# Patient Record
Sex: Male | Born: 1960
Health system: Southern US, Community
[De-identification: ages and names within clinical notes are randomized; demographics above are authoritative.]

## PROBLEM LIST (undated history)

## (undated) DIAGNOSIS — K859 Acute pancreatitis without necrosis or infection, unspecified: Secondary | ICD-10-CM

## (undated) DIAGNOSIS — E119 Type 2 diabetes mellitus without complications: Secondary | ICD-10-CM

## (undated) DIAGNOSIS — J449 Chronic obstructive pulmonary disease, unspecified: Secondary | ICD-10-CM

## (undated) HISTORY — PX: NO PAST SURGERIES: SHX2092

---

## 2010-09-12 ENCOUNTER — Inpatient Hospital Stay (HOSPITAL_COMMUNITY): Admission: EM | Admit: 2010-09-12 | Discharge: 2010-09-20 | Payer: Self-pay | Admitting: Emergency Medicine

## 2010-09-12 LAB — CONVERTED CEMR LAB
Hgb A1c MFr Bld: 6.2 %
LDL Cholesterol: 152 mg/dL
TSH: 1.471 microintl units/mL

## 2010-09-16 DIAGNOSIS — F1020 Alcohol dependence, uncomplicated: Secondary | ICD-10-CM

## 2010-09-18 LAB — CONVERTED CEMR LAB
BUN: 14 mg/dL
Chloride: 101 meq/L
Hemoglobin: 11.8 g/dL
MCHC: 34 g/dL
MCV: 100.3 fL
Potassium: 3.5 meq/L
RBC: 3.45 M/uL
Sodium: 140 meq/L
WBC: 14.3 10*3/uL

## 2010-10-19 ENCOUNTER — Ambulatory Visit: Payer: Self-pay | Admitting: Internal Medicine

## 2010-10-19 ENCOUNTER — Encounter (INDEPENDENT_AMBULATORY_CARE_PROVIDER_SITE_OTHER): Payer: Self-pay | Admitting: Nurse Practitioner

## 2010-10-19 DIAGNOSIS — K859 Acute pancreatitis without necrosis or infection, unspecified: Secondary | ICD-10-CM

## 2010-10-19 DIAGNOSIS — J441 Chronic obstructive pulmonary disease with (acute) exacerbation: Secondary | ICD-10-CM | POA: Insufficient documentation

## 2010-10-19 DIAGNOSIS — J449 Chronic obstructive pulmonary disease, unspecified: Secondary | ICD-10-CM | POA: Insufficient documentation

## 2010-10-19 DIAGNOSIS — F17200 Nicotine dependence, unspecified, uncomplicated: Secondary | ICD-10-CM

## 2010-10-19 HISTORY — DX: Acute pancreatitis without necrosis or infection, unspecified: K85.90

## 2010-10-19 LAB — CONVERTED CEMR LAB
Albumin: 4.2 g/dL (ref 3.5–5.2)
Alkaline Phosphatase: 87 units/L (ref 39–117)
BUN: 12 mg/dL (ref 6–23)
CO2: 24 meq/L (ref 19–32)
Glucose, Bld: 104 mg/dL — ABNORMAL HIGH (ref 70–99)
Sodium: 141 meq/L (ref 135–145)
Total Bilirubin: 0.7 mg/dL (ref 0.3–1.2)
Total Protein: 6.6 g/dL (ref 6.0–8.3)

## 2010-10-20 ENCOUNTER — Encounter (INDEPENDENT_AMBULATORY_CARE_PROVIDER_SITE_OTHER): Payer: Self-pay | Admitting: Nurse Practitioner

## 2010-10-25 ENCOUNTER — Encounter (INDEPENDENT_AMBULATORY_CARE_PROVIDER_SITE_OTHER): Payer: Self-pay | Admitting: Nurse Practitioner

## 2010-10-28 ENCOUNTER — Encounter (INDEPENDENT_AMBULATORY_CARE_PROVIDER_SITE_OTHER): Payer: Self-pay | Admitting: Nurse Practitioner

## 2010-12-13 NOTE — Letter (Signed)
Summary: *HSN Results Follow up  [facility]  [address] [place], [place] [address]   Phone: [phone]  Fax: [phone]      10/20/2010   SEVERO BEBER Fano [address] [place], [place]  [address]   Dear  Mr. AARYAN ESSMAN,                            ____S.Drinkard,FNP   ____D. Gore,FNP       ____B. McPherson,MD   ____V. Rankins,MD    ____E. Mulberry,MD    __X__N. Daphine Deutscher, FNP  ____D. Reche Dixon, MD    ____K. Philipp Deputy, MD    ____Other     This letter is to inform you that your recent test(s):  _______Pap Smear    ___X____Lab Test     _______X-ray    _______ is within acceptable limits  _______ requires a medication change  _______ requires a follow-up lab visit  _______ requires a follow-up visit with your provider   Comments: Labs done during recent office visit are normal.       _________________________________________________________ If you have any questions, please contact our office [phone].                    Sincerely,    Lehman Prom FNP [facility]

## 2010-12-13 NOTE — Letter (Signed)
Summary: Handout Printed  Printed Handout:  - Chronic Obstructive Pulmonary Disease (COPD) 

## 2010-12-13 NOTE — Assessment & Plan Note (Signed)
Summary: NEW - Hospital F/U   Vital Signs:  Patient profile:   50 year old male Height:      71.50 inches Weight:      185.2 pounds BMI:     25.56 Temp:     97.8 degrees F oral Pulse rate:   96 / minute Pulse rhythm:   regular Resp:     20 per minute BP sitting:   120 / 90  (left arm)  Vitals Entered By: Levon Hedger (October 19, 2010 11:21 AM)  Nutrition Counseling: Patient's BMI is greater than 25 and therefore counseled on weight management options. CC: follow-up visit Cole Is Patient Diabetic? No Pain Assessment Patient in pain? no       Does patient need assistance? Functional Status Self care Ambulation Normal   CC:  follow-up visit .  History of Present Illness:  Pt into the office for hospital f/u 09/12/2010 to 09/20/2010 (full hospital d/c reviewed)  No PMH No PSH  Acute alcoholic Pancreatitis - slowly resolved with conservative care.  Pt has progresed to a regular diet and is tolerating well. Initial lipase was 1124 urine drug screen negative  COPD - discharged on oxygen which he does have at home but uses it some at  Pt was on a prednisone taper which he has finished and was placed on combivent and albuterol Angiogram performed in hospital and showed bibasilar atelectasis but was negative for PE. He was treated empirically for COPD exacerbation  S/p alcohol withdrawal - treated with supportive care and routine medications.  Cigarette smoker - pt has been wearing a patch since his hospital d/c.  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 Lives in a house owned by his mother.   Not much work since economy   Habits & Providers  Alcohol-Tobacco-Diet     Alcohol drinks/day: >5     Alcohol Counseling: to STOP drinking     Alcohol type: beer     Tobacco Status: current     Tobacco Counseling: to quit use of tobacco products     Cigarette Packs/Day: 1.5     Year Started: age 78     Year Quit: wearing patch since hospitalization  Exercise-Depression-Behavior     Drug Use: never  Comments: Pt has not been drinking since his hospitalization  Allergies (verified): No Known Drug Allergies  Family History: mother - htn father - (died when pt was 68 months old) maternal grandfater - diabetes  Social History: No children single Tobacco - quit 11/2011during hospitalization ETOH - 6 pack per day but quit 09/2010 Drug - none Smoking Status:  current Drug Use:  never Packs/Day:  1.5  Review of Systems General:  Denies fever. CV:  Denies chest pain or discomfort. Resp:  Denies cough. GI:  Denies abdominal pain, nausea, and vomiting. MS:  Denies joint pain.  Physical Exam  General:  alert.   Head:  normocephalic.   Eyes:  glasses Lungs:  decreased bases Heart:  normal rate and regular rhythm.   Abdomen:  normal bowel sounds.   Msk:  up to the exam table Neurologic:  alert & oriented X3.   Skin:  color normal.   Psych:  Oriented X3.     Impression & Recommendations:  Problem # 1:  PANCREATITIS, ACUTE (ICD-577.0) handout given pt has done the single most important thing which is quitting drinking will recheck labs today Orders: T-Amylase [phone]) T-Lipase [phone]) T-Comprehensive  Metabolic Panel [phone])  Problem # 2:  COPD (ICD-496) handout given pt aware that smoking is a contributor has home 02 that he is wearing at night His updated medication list for this problem includes:    Combivent 18-103 Mcg/act Aero (Ipratropium-albuterol) .Marland Kitchen... 2 puffs four times per day  Problem # 3:  TOBACCO ABUSE (ICD-305.1)  pt is wearing a patch.  He is encouraged to keep up efforts to quit smoking  His updated medication list for this problem includes:    Nicoderm Cq 14 Mg/24hr Pt24 (Nicotine) .Marland Kitchen... Apply to skin daily  Complete Medication List: 1)  Combivent 18-103 Mcg/act Aero (Ipratropium-albuterol) .... 2 puffs four times per  day 2)  Nicoderm Cq 14 Mg/24hr Pt24 (Nicotine) .... Apply to skin daily  Patient Instructions: 1)  Welcome to [facility]) 2)  Keep up your efforts at quitting smoking.  This will be good for your lungs and prevent progession of COPD. 3)  Pancreatitis - labs will be checked today. You are most likely doing much better especially since she has quit smoking. 4)  Call for an eligibility appointment so that you can get an orange card. 5)  Follow up in 2 months for COPD. 6)  Will need 02 sat, peak flow. Prescriptions: COMBIVENT 18-103 MCG/ACT AERO (IPRATROPIUM-ALBUTEROL) 2 puffs four times per day  #1 x 3   Entered and Authorized by:   Lehman Prom FNP   Signed by:   Lehman Prom FNP on 10/19/2010   Method used:   Print then Give to Patient   RxID:   [id number]    Orders Added: 1)  New Patient Level III [99203] 2)  T-Amylase [32992-42683] 3)  T-Lipase [[id number]] 4)  T-Comprehensive Metabolic Panel [[id number]]     X-ray  Procedure date:  09/12/2010  Findings:      mild bibasilar atelectasis.  nonobstructive gas pattern  CT of Abdomen  Procedure date:  09/12/2010  Findings:      findings compatible with acute pancreatitis no evidence for pancreatic necrosis, peripancreatic abscess, or peripancreatic psuedocyst. No vascular complications  [place] of Abdomen  Procedure date:  09/13/2010  Findings:      normal gallbladder and common bile duct.  Small amount of ascites, predominantly around the pancreas as well as small left pleural effusion. fatty infiltration of the liver  CXR  Procedure date:  09/15/2010  Findings:      low lung volumes with bibasilar atelectasis or infiltrates, left greater than right. small left effusion   X-ray  Procedure date:  09/12/2010  Findings:      mild bibasilar atelectasis.  nonobstructive gas pattern  CT of Abdomen  Procedure date:   09/12/2010  Findings:      findings compatible with acute pancreatitis no evidence for pancreatic necrosis, peripancreatic abscess, or peripancreatic psuedocyst. No vascular complications  [place] of Abdomen  Procedure date:  09/13/2010  Findings:      normal gallbladder and common bile duct.  Small amount of ascites, predominantly around the pancreas as well as small left pleural effusion. fatty infiltration of the liver  CXR  Procedure date:  09/15/2010  Findings:      low lung volumes with bibasilar atelectasis or infiltrates, left greater than right. small left effusion

## 2010-12-13 NOTE — Letter (Signed)
Summary: Handout Printed  Printed Handout:  - Pancreatitis, Acute 

## 2010-12-15 NOTE — Letter (Signed)
Summary: DURABLE MEDICAL EQUIPMENT//FAXED  DURABLE MEDICAL EQUIPMENT//FAXED   Imported By: Arta Bruce 10/25/2010 08:44:29  _____________________________________________________________________  External Attachment:    Type:   Image     Comment:   External Document

## 2010-12-15 NOTE — Letter (Signed)
Summary: mailed requested records to [facility]  mailed requested records to [facility]   Imported By: Arta Bruce 10/28/2010 11:25:20  _____________________________________________________________________  External Attachment:    Type:   Image     Comment:   External Document

## 2011-01-24 LAB — CBC
HCT: 48.9 % (ref 39.0–52.0)
MCH: 34.4 pg — ABNORMAL HIGH (ref 26.0–34.0)
MCH: 34.5 pg — ABNORMAL HIGH (ref 26.0–34.0)
MCH: 35.3 pg — ABNORMAL HIGH (ref 26.0–34.0)
MCHC: 33.8 g/dL (ref 30.0–36.0)
MCHC: 34.4 g/dL (ref 30.0–36.0)
MCV: 100.3 fL — ABNORMAL HIGH (ref 78.0–100.0)
MCV: 100.4 fL — ABNORMAL HIGH (ref 78.0–100.0)
MCV: 100.6 fL — ABNORMAL HIGH (ref 78.0–100.0)
MCV: 101.4 fL — ABNORMAL HIGH (ref 78.0–100.0)
Platelets: 120 10*3/uL — ABNORMAL LOW (ref 150–400)
Platelets: 141 10*3/uL — ABNORMAL LOW (ref 150–400)
Platelets: 147 10*3/uL — ABNORMAL LOW (ref 150–400)
Platelets: 151 10*3/uL (ref 150–400)
Platelets: 182 10*3/uL (ref 150–400)
Platelets: 216 10*3/uL (ref 150–400)
RBC: 3.48 MIL/uL — ABNORMAL LOW (ref 4.22–5.81)
RDW: 13.7 % (ref 11.5–15.5)
RDW: 13.8 % (ref 11.5–15.5)
RDW: 13.9 % (ref 11.5–15.5)
RDW: 14 % (ref 11.5–15.5)
RDW: 14 % (ref 11.5–15.5)
WBC: 12.1 10*3/uL — ABNORMAL HIGH (ref 4.0–10.5)
WBC: 14.3 10*3/uL — ABNORMAL HIGH (ref 4.0–10.5)
WBC: 15.1 10*3/uL — ABNORMAL HIGH (ref 4.0–10.5)

## 2011-01-24 LAB — BASIC METABOLIC PANEL
BUN: 10 mg/dL (ref 6–23)
CO2: 28 mEq/L (ref 19–32)
Calcium: 8.3 mg/dL — ABNORMAL LOW (ref 8.4–10.5)
Creatinine, Ser: 0.99 mg/dL (ref 0.4–1.5)
GFR calc Af Amer: >60 mL/min (ref >60)
GFR calc non Af Amer: >60 mL/min (ref >60)
GFR calc non Af Amer: >60 mL/min (ref >60)
Glucose, Bld: 219 mg/dL — ABNORMAL HIGH (ref 70–99)
Potassium: 3.3 mEq/L — ABNORMAL LOW (ref 3.5–5.1)
Potassium: 3.5 mEq/L (ref 3.5–5.1)
Sodium: 133 mEq/L — ABNORMAL LOW (ref 135–145)
Sodium: 140 mEq/L (ref 135–145)

## 2011-01-24 LAB — TSH: TSH: 1.471 u[IU]/mL (ref 0.350–4.500)

## 2011-01-24 LAB — GLUCOSE, CAPILLARY
Glucose-Capillary: 112 mg/dL — ABNORMAL HIGH (ref 70–99)
Glucose-Capillary: 115 mg/dL — ABNORMAL HIGH (ref 70–99)
Glucose-Capillary: 125 mg/dL — ABNORMAL HIGH (ref 70–99)
Glucose-Capillary: 130 mg/dL — ABNORMAL HIGH (ref 70–99)
Glucose-Capillary: 136 mg/dL — ABNORMAL HIGH (ref 70–99)
Glucose-Capillary: 137 mg/dL — ABNORMAL HIGH (ref 70–99)
Glucose-Capillary: 138 mg/dL — ABNORMAL HIGH (ref 70–99)
Glucose-Capillary: 139 mg/dL — ABNORMAL HIGH (ref 70–99)
Glucose-Capillary: 141 mg/dL — ABNORMAL HIGH (ref 70–99)
Glucose-Capillary: 144 mg/dL — ABNORMAL HIGH (ref 70–99)
Glucose-Capillary: 162 mg/dL — ABNORMAL HIGH (ref 70–99)
Glucose-Capillary: 165 mg/dL — ABNORMAL HIGH (ref 70–99)
Glucose-Capillary: 174 mg/dL — ABNORMAL HIGH (ref 70–99)
Glucose-Capillary: 178 mg/dL — ABNORMAL HIGH (ref 70–99)
Glucose-Capillary: 180 mg/dL — ABNORMAL HIGH (ref 70–99)
Glucose-Capillary: 232 mg/dL — ABNORMAL HIGH (ref 70–99)
Glucose-Capillary: 232 mg/dL — ABNORMAL HIGH (ref 70–99)
Glucose-Capillary: 260 mg/dL — ABNORMAL HIGH (ref 70–99)

## 2011-01-24 LAB — COMPREHENSIVE METABOLIC PANEL
ALT: 73 U/L — ABNORMAL HIGH (ref 0–53)
AST: 31 U/L (ref 0–37)
AST: 61 U/L — ABNORMAL HIGH (ref 0–37)
Albumin: 2.2 g/dL — ABNORMAL LOW (ref 3.5–5.2)
Albumin: 2.7 g/dL — ABNORMAL LOW (ref 3.5–5.2)
Albumin: 3.2 g/dL — ABNORMAL LOW (ref 3.5–5.2)
Alkaline Phosphatase: 54 U/L (ref 39–117)
BUN: 11 mg/dL (ref 6–23)
BUN: 13 mg/dL (ref 6–23)
CO2: 23 mEq/L (ref 19–32)
Calcium: 7.4 mg/dL — ABNORMAL LOW (ref 8.4–10.5)
Calcium: 7.6 mg/dL — ABNORMAL LOW (ref 8.4–10.5)
Calcium: 8.1 mg/dL — ABNORMAL LOW (ref 8.4–10.5)
Chloride: 102 mEq/L (ref 96–112)
Chloride: 103 mEq/L (ref 96–112)
Creatinine, Ser: 0.85 mg/dL (ref 0.4–1.5)
Creatinine, Ser: 0.85 mg/dL (ref 0.4–1.5)
Creatinine, Ser: 1 mg/dL (ref 0.4–1.5)
GFR calc Af Amer: >60 mL/min (ref >60)
GFR calc Af Amer: >60 mL/min (ref >60)
GFR calc Af Amer: >60 mL/min (ref >60)
GFR calc non Af Amer: >60 mL/min (ref >60)
Glucose, Bld: 148 mg/dL — ABNORMAL HIGH (ref 70–99)
Potassium: 4.8 mEq/L (ref 3.5–5.1)
Sodium: 138 mEq/L (ref 135–145)
Total Bilirubin: 1.9 mg/dL — ABNORMAL HIGH (ref 0.3–1.2)
Total Protein: 5.7 g/dL — ABNORMAL LOW (ref 6.0–8.3)
Total Protein: 6.1 g/dL (ref 6.0–8.3)
Total Protein: 6.2 g/dL (ref 6.0–8.3)

## 2011-01-24 LAB — CULTURE, BLOOD (ROUTINE X 2)
Culture  Setup Time: 201111030111
Culture: NO GROWTH

## 2011-01-24 LAB — CK TOTAL AND CKMB (NOT AT ARMC)
CK, MB: 3.5 ng/mL (ref 0.3–4.0)
CK, MB: 4.1 ng/mL — ABNORMAL HIGH (ref 0.3–4.0)
CK, MB: 4.1 ng/mL — ABNORMAL HIGH (ref 0.3–4.0)
Relative Index: 2.7 — ABNORMAL HIGH (ref 0.0–2.5)
Relative Index: 3.3 — ABNORMAL HIGH (ref 0.0–2.5)
Relative Index: 3.3 — ABNORMAL HIGH (ref 0.0–2.5)
Total CK: 124 U/L (ref 7–232)
Total CK: 125 U/L (ref 7–232)
Total CK: 128 U/L (ref 7–232)

## 2011-01-24 LAB — LIPID PANEL
Cholesterol: 227 mg/dL — ABNORMAL HIGH (ref 0–200)
HDL: 48 mg/dL (ref >39)
LDL Cholesterol: 152 mg/dL — ABNORMAL HIGH (ref 0–99)
Total CHOL/HDL Ratio: 4.7 RATIO
Triglycerides: 133 mg/dL (ref <150)
VLDL: 27 mg/dL (ref 0–40)

## 2011-01-24 LAB — BLOOD GAS, ARTERIAL
Acid-Base Excess: 2.6 mmol/L — ABNORMAL HIGH (ref 0.0–2.0)
Drawn by: 30996
O2 Content: 6 L/min
O2 Saturation: 93.2 %
Patient temperature: 98.6
pO2, Arterial: 64.7 mmHg — ABNORMAL LOW (ref 80.0–100.0)

## 2011-01-24 LAB — TROPONIN I
Troponin I: 0.02 ng/mL (ref 0.00–0.06)
Troponin I: <0.01 ng/mL (ref 0.00–0.06)

## 2011-01-24 LAB — PROTIME-INR
INR: 1.13 (ref 0.00–1.49)
Prothrombin Time: 14.7 seconds (ref 11.6–15.2)

## 2011-01-24 LAB — VANCOMYCIN, TROUGH: Vancomycin Tr: 11.3 ug/mL (ref 10.0–20.0)

## 2011-01-24 LAB — MAGNESIUM: Magnesium: 1.5 mg/dL (ref 1.5–2.5)

## 2011-01-24 LAB — HEMOGLOBIN A1C: Mean Plasma Glucose: 131 mg/dL — ABNORMAL HIGH (ref <117)

## 2011-01-24 LAB — PHOSPHORUS
Phosphorus: 1.9 mg/dL — ABNORMAL LOW (ref 2.3–4.6)
Phosphorus: 2.1 mg/dL — ABNORMAL LOW (ref 2.3–4.6)
Phosphorus: 2.1 mg/dL — ABNORMAL LOW (ref 2.3–4.6)

## 2011-01-24 LAB — D-DIMER, QUANTITATIVE: D-Dimer, Quant: 19.81 ug/mL-FEU — ABNORMAL HIGH (ref 0.00–0.48)

## 2011-01-24 LAB — LIPASE, BLOOD: Lipase: 76 U/L — ABNORMAL HIGH (ref 11–59)

## 2011-01-25 LAB — RAPID URINE DRUG SCREEN, HOSP PERFORMED
Amphetamines: NOT DETECTED
Barbiturates: NOT DETECTED
Benzodiazepines: NOT DETECTED
Cocaine: NOT DETECTED

## 2011-01-25 LAB — CK TOTAL AND CKMB (NOT AT ARMC)
CK, MB: 5.3 ng/mL — ABNORMAL HIGH (ref 0.3–4.0)
Relative Index: 3.6 — ABNORMAL HIGH (ref 0.0–2.5)
Total CK: 148 U/L (ref 7–232)

## 2011-01-25 LAB — URINALYSIS, ROUTINE W REFLEX MICROSCOPIC
Bilirubin Urine: NEGATIVE
Glucose, UA: NEGATIVE mg/dL
Ketones, ur: NEGATIVE mg/dL
Leukocytes, UA: NEGATIVE
Protein, ur: 30 mg/dL — AB

## 2011-01-25 LAB — CBC
HCT: 50.3 % (ref 39.0–52.0)
Hemoglobin: 17.5 g/dL — ABNORMAL HIGH (ref 13.0–17.0)
RBC: 5.04 MIL/uL (ref 4.22–5.81)
WBC: 17 10*3/uL — ABNORMAL HIGH (ref 4.0–10.5)

## 2011-01-25 LAB — ETHANOL: Alcohol, Ethyl (B): <5 mg/dL (ref 0–10)

## 2011-01-25 LAB — DIFFERENTIAL
Basophils Relative: 0 % (ref 0–1)
Eosinophils Absolute: 0 10*3/uL (ref 0.0–0.7)
Monocytes Relative: 5 % (ref 3–12)
Neutrophils Relative %: 90 % — ABNORMAL HIGH (ref 43–77)

## 2011-01-25 LAB — CULTURE, BLOOD (ROUTINE X 2)
Culture  Setup Time: 201110312300
Culture: NO GROWTH

## 2011-01-25 LAB — COMPREHENSIVE METABOLIC PANEL
ALT: 108 U/L — ABNORMAL HIGH (ref 0–53)
Alkaline Phosphatase: 79 U/L (ref 39–117)
CO2: 24 mEq/L (ref 19–32)
GFR calc non Af Amer: >60 mL/min (ref >60)
Glucose, Bld: 164 mg/dL — ABNORMAL HIGH (ref 70–99)
Potassium: 4.6 mEq/L (ref 3.5–5.1)
Sodium: 137 mEq/L (ref 135–145)

## 2011-01-25 LAB — LIPASE, BLOOD: Lipase: 1124 U/L — ABNORMAL HIGH (ref 11–59)

## 2011-01-25 LAB — URINE CULTURE: Special Requests: NEGATIVE

## 2011-01-25 LAB — LACTIC ACID, PLASMA: Lactic Acid, Venous: 5.1 mmol/L — ABNORMAL HIGH (ref 0.5–2.2)

## 2011-01-25 LAB — TROPONIN I: Troponin I: 0.01 ng/mL (ref 0.00–0.06)

## 2011-07-06 ENCOUNTER — Inpatient Hospital Stay (HOSPITAL_COMMUNITY)
Admission: AD | Admit: 2011-07-06 | Discharge: 2011-07-10 | DRG: 439 | Disposition: A | Discharge status: Home / Self Care | Payer: Self-pay | Source: Other Acute Inpatient Hospital | Attending: Internal Medicine | Admitting: Internal Medicine

## 2011-07-06 ENCOUNTER — Emergency Department (HOSPITAL_BASED_OUTPATIENT_CLINIC_OR_DEPARTMENT_OTHER)
Admission: EM | Admit: 2011-07-06 | Discharge: 2011-07-06 | Disposition: A | Discharge status: Home / Self Care | Payer: Self-pay | Attending: Emergency Medicine | Admitting: Emergency Medicine

## 2011-07-06 ENCOUNTER — Encounter: Payer: Self-pay | Admitting: Family Medicine

## 2011-07-06 ENCOUNTER — Emergency Department (INDEPENDENT_AMBULATORY_CARE_PROVIDER_SITE_OTHER): Payer: Self-pay

## 2011-07-06 DIAGNOSIS — F17200 Nicotine dependence, unspecified, uncomplicated: Secondary | ICD-10-CM | POA: Diagnosis present

## 2011-07-06 DIAGNOSIS — J449 Chronic obstructive pulmonary disease, unspecified: Secondary | ICD-10-CM | POA: Insufficient documentation

## 2011-07-06 DIAGNOSIS — F1010 Alcohol abuse, uncomplicated: Secondary | ICD-10-CM | POA: Diagnosis present

## 2011-07-06 DIAGNOSIS — K7689 Other specified diseases of liver: Secondary | ICD-10-CM

## 2011-07-06 DIAGNOSIS — R110 Nausea: Secondary | ICD-10-CM

## 2011-07-06 DIAGNOSIS — F329 Major depressive disorder, single episode, unspecified: Secondary | ICD-10-CM | POA: Diagnosis present

## 2011-07-06 DIAGNOSIS — K859 Acute pancreatitis without necrosis or infection, unspecified: Secondary | ICD-10-CM | POA: Insufficient documentation

## 2011-07-06 DIAGNOSIS — F10239 Alcohol dependence with withdrawal, unspecified: Secondary | ICD-10-CM | POA: Diagnosis present

## 2011-07-06 DIAGNOSIS — K560 Paralytic ileus: Secondary | ICD-10-CM | POA: Diagnosis present

## 2011-07-06 DIAGNOSIS — R109 Unspecified abdominal pain: Secondary | ICD-10-CM

## 2011-07-06 DIAGNOSIS — F1020 Alcohol dependence, uncomplicated: Secondary | ICD-10-CM | POA: Diagnosis present

## 2011-07-06 DIAGNOSIS — F10939 Alcohol use, unspecified with withdrawal, unspecified: Secondary | ICD-10-CM | POA: Diagnosis present

## 2011-07-06 DIAGNOSIS — J4489 Other specified chronic obstructive pulmonary disease: Secondary | ICD-10-CM | POA: Insufficient documentation

## 2011-07-06 DIAGNOSIS — F3289 Other specified depressive episodes: Secondary | ICD-10-CM | POA: Diagnosis present

## 2011-07-06 HISTORY — DX: Acute pancreatitis without necrosis or infection, unspecified: K85.90

## 2011-07-06 HISTORY — DX: Chronic obstructive pulmonary disease, unspecified: J44.9

## 2011-07-06 LAB — CBC
HCT: 46.8 % (ref 39.0–52.0)
Hemoglobin: 16.8 g/dL (ref 13.0–17.0)
MCH: 33.5 pg (ref 26.0–34.0)
MCHC: 35.9 g/dL (ref 30.0–36.0)
MCV: 93.4 fL (ref 78.0–100.0)

## 2011-07-06 LAB — URINALYSIS, ROUTINE W REFLEX MICROSCOPIC
Leukocytes, UA: NEGATIVE
Nitrite: NEGATIVE
Specific Gravity, Urine: 1.027 (ref 1.005–1.030)
Urobilinogen, UA: 0.2 mg/dL (ref 0.0–1.0)
pH: 5.5 (ref 5.0–8.0)

## 2011-07-06 LAB — COMPREHENSIVE METABOLIC PANEL
Albumin: 4.4 g/dL (ref 3.5–5.2)
BUN: 9 mg/dL (ref 6–23)
Calcium: 9.6 mg/dL (ref 8.4–10.5)
Creatinine, Ser: 0.7 mg/dL (ref 0.50–1.35)
GFR calc Af Amer: >60 mL/min (ref >60)
Glucose, Bld: 113 mg/dL — ABNORMAL HIGH (ref 70–99)
Total Protein: 7.9 g/dL (ref 6.0–8.3)

## 2011-07-06 LAB — DIFFERENTIAL
Basophils Relative: 0 % (ref 0–1)
Eosinophils Absolute: 0.1 10*3/uL (ref 0.0–0.7)
Eosinophils Relative: 0 % (ref 0–5)
Monocytes Absolute: 1.2 10*3/uL — ABNORMAL HIGH (ref 0.1–1.0)
Monocytes Relative: 8 % (ref 3–12)
Neutrophils Relative %: 87 % — ABNORMAL HIGH (ref 43–77)

## 2011-07-06 LAB — URINE MICROSCOPIC-ADD ON

## 2011-07-06 LAB — LIPASE, BLOOD: Lipase: 1908 U/L — ABNORMAL HIGH (ref 11–59)

## 2011-07-06 MED ORDER — HYDROMORPHONE HCL 1 MG/ML IJ SOLN
1.0000 mg | Freq: Once | INTRAMUSCULAR | Status: AC
  Administered 2011-07-06: 1 mg via INTRAVENOUS
  Filled 2011-07-06: qty 1

## 2011-07-06 MED ORDER — ONDANSETRON HCL 4 MG/2ML IJ SOLN
4.0000 mg | Freq: Once | INTRAMUSCULAR | Status: AC
  Administered 2011-07-06: 4 mg via INTRAVENOUS
  Filled 2011-07-06: qty 2

## 2011-07-06 MED ORDER — IOHEXOL 300 MG/ML  SOLN
100.0000 mL | Freq: Once | INTRAMUSCULAR | Status: AC | PRN
  Administered 2011-07-06: 100 mL via INTRAVENOUS

## 2011-07-06 NOTE — ED Notes (Signed)
Attempted to call report to [facility] and Nurse said she will call me back for report.

## 2011-07-06 NOTE — ED Notes (Signed)
[facility] arrived and took Pt. To [facility].  Pt. Gone with [facility] at approx. 21:45.

## 2011-07-06 NOTE — ED Notes (Signed)
Pt c/o diffuse abdominal pain since yesterday. Pt sts he has h/o pancreatitis with hospital admission in past. Pt sts he was drinking alcohol this past weekend.

## 2011-07-06 NOTE — ED Provider Notes (Signed)
History     CSN: [id number] Arrival date & time: 07/06/2011  4:16 PM  Chief Complaint  Patient presents with  . Abdominal Pain   HPI Comments: Pt states that he has a history of pancreatitis and it feels similar:pt states that he is drinking 8-12 beer every other day:pt state states that he was admitted about 8 months ago for the symptoms  Patient is a 50 y.o. male presenting with abdominal pain. The history is provided by the patient. No language interpreter was used.  Abdominal Pain The primary symptoms of the illness include abdominal pain. The primary symptoms of the illness do not include fever. The current episode started yesterday. The onset of the illness was sudden. The problem has not changed since onset. The illness is associated with alcohol use. The patient states that she believes she is currently not pregnant. The patient has not had a change in bowel habit. Symptoms associated with the illness do not include chills, anorexia, constipation, hematuria or back pain.    Past Medical History  Diagnosis Date  . COPD (chronic obstructive pulmonary disease)   . Pancreatitis     History reviewed. No pertinent past surgical history.  No family history on file.  History  Substance Use Topics  . Smoking status: Current Everyday Smoker  . Smokeless tobacco: Not on file  . Alcohol Use: Yes      Review of Systems  Constitutional: Negative for fever and chills.  Gastrointestinal: Positive for abdominal pain. Negative for constipation and anorexia.  Genitourinary: Negative for hematuria.       Neg for n/v/d  Musculoskeletal: Negative for back pain.  All other systems reviewed and are negative.    Physical Exam  BP 199/122  Pulse 116  Temp(Src) 98.1 F (36.7 C) (Oral)  Resp 20  Ht 6\' 1"  (1.854 m)  Wt 185 lb (83.915 kg)  BMI 24.41 kg/m2  SpO2 98%  Physical Exam  Nursing note and vitals reviewed. Constitutional: He is oriented to person, place, and time. He  appears well-developed.  Eyes: Pupils are equal, round, and reactive to light.  Neck: Normal range of motion.  Cardiovascular: Normal rate and regular rhythm.   Pulmonary/Chest: Effort normal and breath sounds normal.  Abdominal: Soft. He exhibits distension. There is generalized tenderness.  Musculoskeletal: Normal range of motion.  Neurological: He is alert and oriented to person, place, and time.  Skin: Skin is dry.  Psychiatric: He has a normal mood and affect.    ED Course  Procedures  Results for orders placed during the hospital encounter of 07/06/11  CBC      Component Value Range   WBC 14.9 (*) 4.0 - 10.5 (K/uL)   RBC 5.01  4.22 - 5.81 (MIL/uL)   Hemoglobin 16.8  13.0 - 17.0 (g/dL)   HCT 91.4  78.2 - 95.6 (%)   MCV 93.4  78.0 - 100.0 (fL)   MCH 33.5  26.0 - 34.0 (pg)   MCHC 35.9  30.0 - 36.0 (g/dL)   RDW 21.3  08.6 - 57.8 (%)   Platelets 155  150 - 400 (K/uL)  DIFFERENTIAL      Component Value Range   Neutrophils Relative 87 (*) 43 - 77 (%)   Neutro Abs 12.9 (*) 1.7 - 7.7 (K/uL)   Lymphocytes Relative 5 (*) 12 - 46 (%)   Lymphs Abs 0.7  0.7 - 4.0 (K/uL)   Monocytes Relative 8  3 - 12 (%)   Monocytes Absolute 1.2 (*)  0.1 - 1.0 (K/uL)   Eosinophils Relative 0  0 - 5 (%)   Eosinophils Absolute 0.1  0.0 - 0.7 (K/uL)   Basophils Relative 0  0 - 1 (%)   Basophils Absolute 0.0  0.0 - 0.1 (K/uL)  COMPREHENSIVE METABOLIC PANEL      Component Value Range   Sodium 130 (*) 135 - 145 (mEq/L)   Potassium 3.8  3.5 - 5.1 (mEq/L)   Chloride 93 (*) 96 - 112 (mEq/L)   CO2 21  19 - 32 (mEq/L)   Glucose, Bld 113 (*) 70 - 99 (mg/dL)   BUN 9  6 - 23 (mg/dL)   Creatinine, Ser 1.61  0.50 - 1.35 (mg/dL)   Calcium 9.6  8.4 - 09.6 (mg/dL)   Total Protein 7.9  6.0 - 8.3 (g/dL)   Albumin 4.4  3.5 - 5.2 (g/dL)   AST 34  0 - 37 (U/L)   ALT 41  0 - 53 (U/L)   Alkaline Phosphatase 91  39 - 117 (U/L)   Total Bilirubin 1.8 (*) 0.3 - 1.2 (mg/dL)   GFR calc non Af Amer >60  >60 (mL/min)    GFR calc Af Amer >60  >60 (mL/min)  LIPASE, BLOOD      Component Value Range   Lipase 1908 (*) 11 - 59 (U/L)  URINALYSIS, ROUTINE W REFLEX MICROSCOPIC      Component Value Range   Color, Urine YELLOW  YELLOW    Appearance CLEAR  CLEAR    Specific Gravity, Urine 1.027  1.005 - 1.030    pH 5.5  5.0 - 8.0    Glucose, UA NEGATIVE  NEGATIVE (mg/dL)   Hgb urine dipstick SMALL (*) NEGATIVE    Bilirubin Urine NEGATIVE  NEGATIVE    Ketones, ur 15 (*) NEGATIVE (mg/dL)   Protein, ur 30 (*) NEGATIVE (mg/dL)   Urobilinogen, UA 0.2  0.0 - 1.0 (mg/dL)   Nitrite NEGATIVE  NEGATIVE    Leukocytes, UA NEGATIVE  NEGATIVE   URINE MICROSCOPIC-ADD ON      Component Value Range   Squamous Epithelial / LPF RARE  RARE    RBC / HPF 3-6  <3 (RBC/hpf)   Bacteria, UA RARE  RARE   ETHANOL      Component Value Range   Alcohol, Ethyl (B) <11  0 - 11 (mg/dL)   Ct Abdomen Pelvis W Contrast  07/06/2011  *RADIOLOGY REPORT*  Clinical Data: Abdominal pain and nausea.  History pancreatitis.  CT ABDOMEN AND PELVIS WITH CONTRAST  Technique:  Multidetector CT imaging of the abdomen and pelvis was performed following the standard protocol during bolus administration of intravenous contrast.  Contrast: 100 ml Omnipaque-300.  Comparison: CT abdomen pelvis 09/12/2010.  Findings: Streaky bibasilar opacities, greater on the left, are most compatible with atelectasis.  No pleural or pericardial effusion.  The liver is diffusely low attenuating consistent with fatty infiltration.  No focal liver lesion.  Stranding is present about the pancreas consistent with pancreatitis.  The pancreas enhances homogeneously.  No focal fluid collection.  The splenic and portal veins are patent.  The spleen and adrenal glands appear normal. The patient has bilateral parapelvic renal cysts, unchanged. Stomach and small large bowel appear normal.  No focal bony abnormality with lower lumbar degenerative change noted.  IMPRESSION:  1.  Findings consistent  with pancreatitis without pancreatic necrosis or pseudocyst formation. 2.  Fatty infiltration of the liver.  Original Report Authenticated By: Bernadene Bell. Maricela Curet, M.D.   MDM;  Pt to be admitted to [facility] long for pancreatitis  Pt seen by Mrs. Rubin Payor and subsequently by me.  Review of prior charts shows prior admission for acute pancreatitis in October 2011.  He has been drinking heavily.  Exam shows midabdominal tenderness.  He is not jaundiced.  Lab workup is ongoing, and he is drinking oral contrast for CT of abdomen/pelvis Osvaldo Human, M.D.  6:15 PM  Medical screening examination/treatment/procedure(s) were conducted as a shared visit with non-physician practitioner(s) and myself.  I personally evaluated the patient during the encounter  Osvaldo Human, M.D.   Teressa Lower, NP 07/06/11 1846  Carleene Cooper III, MD 07/07/11 1336

## 2011-07-06 NOTE — ED Notes (Signed)
Pt. Reports he had his last drink on yesterday.

## 2011-07-07 ENCOUNTER — Inpatient Hospital Stay (HOSPITAL_COMMUNITY): Payer: Self-pay

## 2011-07-07 LAB — BASIC METABOLIC PANEL
BUN: 7 mg/dL (ref 6–23)
Chloride: 102 mEq/L (ref 96–112)
Creatinine, Ser: 0.63 mg/dL (ref 0.50–1.35)
GFR calc Af Amer: >60 mL/min (ref >60)
Glucose, Bld: 146 mg/dL — ABNORMAL HIGH (ref 70–99)
Potassium: 4 mEq/L (ref 3.5–5.1)

## 2011-07-07 LAB — CBC
HCT: 40.6 % (ref 39.0–52.0)
Hemoglobin: 13.9 g/dL (ref 13.0–17.0)
MCV: 96.7 fL (ref 78.0–100.0)
RDW: 13.1 % (ref 11.5–15.5)
WBC: 10.6 10*3/uL — ABNORMAL HIGH (ref 4.0–10.5)

## 2011-07-07 LAB — VITAMIN B12: Vitamin B-12: 239 pg/mL (ref 211–911)

## 2011-07-07 LAB — FOLATE RBC: RBC Folate: 1212 ng/mL — ABNORMAL HIGH (ref >=366)

## 2011-07-08 LAB — LIPID PANEL
HDL: 36 mg/dL — ABNORMAL LOW (ref >39)
LDL Cholesterol: 117 mg/dL — ABNORMAL HIGH (ref 0–99)
Total CHOL/HDL Ratio: 5.3 RATIO
VLDL: 36 mg/dL (ref 0–40)

## 2011-07-08 LAB — COMPREHENSIVE METABOLIC PANEL
ALT: 17 U/L (ref 0–53)
AST: 15 U/L (ref 0–37)
Albumin: 2.7 g/dL — ABNORMAL LOW (ref 3.5–5.2)
Alkaline Phosphatase: 68 U/L (ref 39–117)
BUN: 7 mg/dL (ref 6–23)
Chloride: 102 mEq/L (ref 96–112)
Potassium: 3.4 mEq/L — ABNORMAL LOW (ref 3.5–5.1)
Sodium: 136 mEq/L (ref 135–145)
Total Bilirubin: 1 mg/dL (ref 0.3–1.2)
Total Protein: 6 g/dL (ref 6.0–8.3)

## 2011-07-08 LAB — MAGNESIUM: Magnesium: 1.9 mg/dL (ref 1.5–2.5)

## 2011-07-08 LAB — PROTIME-INR
INR: 1.04 (ref 0.00–1.49)
Prothrombin Time: 13.8 seconds (ref 11.6–15.2)

## 2011-07-09 LAB — COMPREHENSIVE METABOLIC PANEL
ALT: 21 U/L (ref 0–53)
AST: 27 U/L (ref 0–37)
Albumin: 2.7 g/dL — ABNORMAL LOW (ref 3.5–5.2)
CO2: 29 mEq/L (ref 19–32)
Chloride: 101 mEq/L (ref 96–112)
Creatinine, Ser: 0.55 mg/dL (ref 0.50–1.35)
Sodium: 137 mEq/L (ref 135–145)
Total Bilirubin: 0.8 mg/dL (ref 0.3–1.2)

## 2011-07-09 LAB — CBC
MCV: 99.2 fL (ref 78.0–100.0)
Platelets: 129 10*3/uL — ABNORMAL LOW (ref 150–400)
RBC: 3.58 MIL/uL — ABNORMAL LOW (ref 4.22–5.81)
RDW: 13.1 % (ref 11.5–15.5)
WBC: 7.3 10*3/uL (ref 4.0–10.5)

## 2011-07-09 LAB — LIPID PANEL
HDL: 31 mg/dL — ABNORMAL LOW (ref >39)
LDL Cholesterol: 128 mg/dL — ABNORMAL HIGH (ref 0–99)
Triglycerides: 133 mg/dL (ref <150)
VLDL: 27 mg/dL (ref 0–40)

## 2011-07-18 NOTE — H&P (Signed)
NAMEARLEY, [name] NO.:  [id number]  MEDICAL RECORD NO.:  [id number]  LOCATION:  1319                         FACILITY:  [facility]  PHYSICIAN:  Houston Siren, MD           DATE OF BIRTH:  Mar 28, 1961  DATE OF ADMISSION:  07/06/2011 DATE OF DISCHARGE:                             HISTORY & PHYSICAL   PRIMARY CARE PHYSICIAN:  None.  REASON FOR ADMISSION:  Alcoholic pancreatitis.  ADVANCE DIRECTIVE:  Full code.  HISTORY OF PRESENT ILLNESS:  [name] is a 50 year old male with history of alcohol abuse, tobacco abuse, depression, who stated he started drinking 8 to 10 drinks per day for the past several months, presents to [facility] complaining of abdominal pain, nausea, and vomiting.  He has been sober for the past 2 years prior to that.  He stated he was depressed and never seek any medical help, and then started drinking again.  He has no prior history of withdrawal seizure or DT's. Evaluation at [facility] showed lipase of 1900.  Liver function tests were rather unremarkable in the 30s and 40s.  His calcium was 9.6 and alcohol level was less than 11.  He had a leukocytosis with white count of 14,900 and hemoglobin of 16.8.  His platelet count was 155,000. He had normal creatinine, and his potassium was 3.8.  An abdominal pelvic CT showed evidence of pancreatitis with no necrosis and no pseudocyst formation.  He was subsequently transferred to [facility] for further treatment.  PAST MEDICAL HISTORY:  As above.  ALLERGIES:  No known drug allergies.  CURRENT MEDICATIONS:  On no chronic meds.  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 He has a girlfriend, never married, and has no children.  He does smoke and drink alcohol, but denies any drug use.  FAMILY HISTORY:  Noncontributory.  PHYSICAL EXAMINATION:  VITAL SIGNS:  Heart rate of 100, respiratory rate of 20, afebrile.  Blood pressure is  199/120. GENERAL:  He is alert and oriented and conversing.  He does appear to be slightly nervous. HEENT:  Sclerae are nonicteric.  Speech is fluent.  Tongue is midline. NECK:  Supple. CARDIOVASCULAR:  Revealed S1, S2.  Tachycardia.  No murmur, rub, or gallop. LUNGS:  Clear.  He does have slight tremor. ABDOMINAL:  Slightly tender over the epigastric area, but no right upper quadrant pain.  Bowel sounds present, no ascites.  No palpable mass.  He has no peripheral stigmata of chronic liver disease. EXTREMITIES:  Show no edema.  He has good distal pulses bilaterally. SKIN:  Warm and dry.  OBJECTIVE FINDINGS:  As above.  IMPRESSION:  Alcoholic pancreatitis.  CT showed no necrosis of the pancreas and no pseudocyst formation.  He will need IV fluid, pain medication, potassium repletion, and thiamine.  We will put him on CIWA protocol with Ativan.  We will check the B12 and folate levels.  I expect him to undergo alcohol withdrawal in a couple of days.  We will admit him to telemetry if available and to [facility] 4.     Houston Siren, MD  PL/MEDQ  D:  07/06/2011  T:  07/06/2011  Job:  [id number]  Electronically Signed by Houston Siren  on 07/18/2011 02:58:26 AM

## 2011-07-28 NOTE — Discharge Summary (Signed)
  [name], [name]            ACCOUNT NO.:  [id number]  MEDICAL RECORD NO.:  [id number]  LOCATION:  1509                         FACILITY:  [facility]  PHYSICIAN:  Nathan Overlie, MD       DATE OF BIRTH:  [birth year]  DATE OF ADMISSION:  07/06/2011 DATE OF DISCHARGE:  07/10/2011                              DISCHARGE SUMMARY   PRIMARY CARE PHYSICIAN:  Unassigned.  DISCHARGE DIAGNOSES: 1. Alcoholic pancreatitis. 2. Alcohol dependence. 3. Hypertension observed secondary to alcohol withdrawal.  DISCHARGE MEDICATIONS: 1. Folic acid 1 mg p.o. daily. 2. Hydrocodone 7.5/325 one tablet p.o. q.6 p.r.n. 3. Zofran 4 mg p.o. q.6 p.r.n. 4. Thiamine 100 mg p.o. daily. 5. Prilosec 20 mg p.o. daily as needed.  PROCEDURES:  Ultrasound of the abdomen and pelvis shows diffuse fatty infiltration of the liver.  No evidence of cholelithiasis or acute cholecystitis.  CT scan of the abdomen and pelvis shows findings consistent with pancreatitis without pancreatic necrosis or pseudocyst formation.  SUBJECTIVE:  This is a 50 year old male with a history of alcohol dependence, alcohol abuse, drinking 8-10 beers per day, who presents to the ER with a chief complaint of abdominal pain, nausea, vomiting.  The patient had been sober for the last 2 years, but presented to [facility] and presented with the above symptoms and was found to have a lipase of 1900 and a CT scan consistent with alcoholic pancreatitis, also an elevated white count of about 14.9 K.  HOSPITAL COURSE: 1. Alcoholic pancreatitis.  The patient was treated with conservative     measures, IV fluids.  His repeat lipase was monitored and was found     to be within the normal range of 61.  His symptoms resolved.  His     diet was advanced and he tolerated p.o. diet for at least 24 hours.     He did require IV Dilaudid for the first 2 days and is being     switched to p.o. Norco for pain control. 2. Alcohol dependence.   Social work consultation was obtained.  He was     started on folic acid and CIWA protocol and thiamine.  The patient     will continue with folic acid and thiamine.  PHYSICAL EXAMINATION PRIOR TO DISCHARGE:  VITAL SIGNS:  Blood pressure 169/100, pulse 68, respirations 18, temperature 98.9, 97% on room air. GENERAL:  Currently comfortable in no acute cardiopulmonary distress. HEENT:  Pupils equal and reactive.  Extraocular movements intact. NECK:  Supple.  No JVD. LUNGS: Clear to auscultation bilaterally.  No wheezes, crackles, or rhonchi. CARDIOVASCULAR:  Regular rate and rhythm.  No murmurs, rubs, or gallops. ABDOMEN:  Obese, soft, nontender, nondistended. EXTREMITIES: Without cyanosis, clubbing, or edema. NEUROLOGIC: Cranial nerves II-XII grossly intact.  FOLLOWUP APPOINTMENT:  PCP in 5-7 days.  Follow up with alcohol rehab and alcohol cessation counselor.     Nathan Overlie, MD     NA/MEDQ  D:  07/10/2011  T:  07/10/2011  Job:  [id number]  Electronically Signed by Nathan Overlie MD on 07/28/2011 07:30:22 AM

## 2012-02-08 ENCOUNTER — Inpatient Hospital Stay (HOSPITAL_BASED_OUTPATIENT_CLINIC_OR_DEPARTMENT_OTHER)
Admission: EM | Admit: 2012-02-08 | Discharge: 2012-02-10 | DRG: 440 | Disposition: A | Discharge status: Home / Self Care | Payer: Self-pay | Attending: Internal Medicine | Admitting: Internal Medicine

## 2012-02-08 ENCOUNTER — Encounter (HOSPITAL_BASED_OUTPATIENT_CLINIC_OR_DEPARTMENT_OTHER): Payer: Self-pay | Admitting: *Deleted

## 2012-02-08 DIAGNOSIS — J449 Chronic obstructive pulmonary disease, unspecified: Secondary | ICD-10-CM | POA: Diagnosis present

## 2012-02-08 DIAGNOSIS — E860 Dehydration: Secondary | ICD-10-CM | POA: Diagnosis present

## 2012-02-08 DIAGNOSIS — R112 Nausea with vomiting, unspecified: Secondary | ICD-10-CM | POA: Diagnosis present

## 2012-02-08 DIAGNOSIS — K861 Other chronic pancreatitis: Secondary | ICD-10-CM

## 2012-02-08 DIAGNOSIS — F1010 Alcohol abuse, uncomplicated: Secondary | ICD-10-CM

## 2012-02-08 DIAGNOSIS — F17200 Nicotine dependence, unspecified, uncomplicated: Secondary | ICD-10-CM | POA: Diagnosis present

## 2012-02-08 DIAGNOSIS — J441 Chronic obstructive pulmonary disease with (acute) exacerbation: Secondary | ICD-10-CM | POA: Diagnosis present

## 2012-02-08 DIAGNOSIS — F1020 Alcohol dependence, uncomplicated: Secondary | ICD-10-CM | POA: Diagnosis present

## 2012-02-08 DIAGNOSIS — J4489 Other specified chronic obstructive pulmonary disease: Secondary | ICD-10-CM | POA: Diagnosis present

## 2012-02-08 DIAGNOSIS — K859 Acute pancreatitis without necrosis or infection, unspecified: Principal | ICD-10-CM | POA: Diagnosis present

## 2012-02-08 DIAGNOSIS — E876 Hypokalemia: Secondary | ICD-10-CM | POA: Diagnosis present

## 2012-02-08 LAB — COMPREHENSIVE METABOLIC PANEL
AST: 28 U/L (ref 0–37)
Albumin: 4.4 g/dL (ref 3.5–5.2)
BUN: 4 mg/dL — ABNORMAL LOW (ref 6–23)
Calcium: 9.8 mg/dL (ref 8.4–10.5)
Chloride: 90 mEq/L — ABNORMAL LOW (ref 96–112)
Creatinine, Ser: 0.6 mg/dL (ref 0.50–1.35)
Total Bilirubin: 1.7 mg/dL — ABNORMAL HIGH (ref 0.3–1.2)
Total Protein: 7.9 g/dL (ref 6.0–8.3)

## 2012-02-08 LAB — CBC
HCT: 46.9 % (ref 39.0–52.0)
Hemoglobin: 16.9 g/dL (ref 13.0–17.0)
MCH: 33.6 pg (ref 26.0–34.0)
MCHC: 36 g/dL (ref 30.0–36.0)
MCV: 93.2 fL (ref 78.0–100.0)
RDW: 12.2 % (ref 11.5–15.5)

## 2012-02-08 LAB — DIFFERENTIAL
Basophils Absolute: 0 10*3/uL (ref 0.0–0.1)
Basophils Relative: 0 % (ref 0–1)
Eosinophils Absolute: 0.1 10*3/uL (ref 0.0–0.7)
Eosinophils Relative: 1 % (ref 0–5)
Monocytes Absolute: 0.9 10*3/uL (ref 0.1–1.0)
Monocytes Relative: 7 % (ref 3–12)
Neutro Abs: 10.7 10*3/uL — ABNORMAL HIGH (ref 1.7–7.7)

## 2012-02-08 LAB — URINALYSIS, ROUTINE W REFLEX MICROSCOPIC
Bilirubin Urine: NEGATIVE
Ketones, ur: 15 mg/dL — AB
Leukocytes, UA: NEGATIVE
Nitrite: NEGATIVE
Specific Gravity, Urine: 1.017 (ref 1.005–1.030)
Urobilinogen, UA: 0.2 mg/dL (ref 0.0–1.0)
pH: 5.5 (ref 5.0–8.0)

## 2012-02-08 LAB — LIPASE, BLOOD: Lipase: 752 U/L — ABNORMAL HIGH (ref 11–59)

## 2012-02-08 LAB — URINE MICROSCOPIC-ADD ON

## 2012-02-08 MED ORDER — CHLORHEXIDINE GLUCONATE 0.12 % MT SOLN
15.0000 mL | Freq: Two times a day (BID) | OROMUCOSAL | Status: DC
  Administered 2012-02-08 – 2012-02-10 (×4): 15 mL via OROMUCOSAL
  Filled 2012-02-08 (×6): qty 15

## 2012-02-08 MED ORDER — HYDROMORPHONE HCL PF 1 MG/ML IJ SOLN
1.0000 mg | Freq: Once | INTRAMUSCULAR | Status: AC
  Administered 2012-02-08: 1 mg via INTRAVENOUS
  Filled 2012-02-08: qty 1

## 2012-02-08 MED ORDER — DEXTROSE-NACL 5-0.45 % IV SOLN
INTRAVENOUS | Status: AC
  Administered 2012-02-08: 21:00:00 via INTRAVENOUS

## 2012-02-08 MED ORDER — ONDANSETRON HCL 4 MG/2ML IJ SOLN
4.0000 mg | Freq: Once | INTRAMUSCULAR | Status: AC
  Administered 2012-02-08: 4 mg via INTRAVENOUS
  Filled 2012-02-08: qty 2

## 2012-02-08 MED ORDER — BIOTENE DRY MOUTH MT LIQD
15.0000 mL | Freq: Two times a day (BID) | OROMUCOSAL | Status: DC
  Administered 2012-02-09 (×2): 15 mL via OROMUCOSAL

## 2012-02-08 MED ORDER — SODIUM CHLORIDE 0.9 % IV BOLUS (SEPSIS)
1000.0000 mL | Freq: Once | INTRAVENOUS | Status: AC
  Administered 2012-02-08: 1000 mL via INTRAVENOUS

## 2012-02-08 NOTE — ED Notes (Signed)
Abdominal pain x 3 days. Alcoholic. Hx of pancreatitis.

## 2012-02-08 NOTE — ED Provider Notes (Signed)
History     CSN: [id number]  Arrival date & time 02/08/12  4742   First MD Initiated Contact with Patient 02/08/12 1952      Chief Complaint  Patient presents with  . Abdominal Pain    (Consider location/radiation/quality/duration/timing/severity/associated sxs/prior treatment) HPI Comments: Patient presents with a three-day history of upper abdominal pain. He has a history of alcohol use and a history of pancreatitis in the past. He states last episode was about one year ago. He states this is the same pain. He had previously with his pancreatitis. He got some nausea and vomiting, but no diarrhea. No fevers. No blood in his emesis or blood in his stool. He states the pain is progressively getting worse since Monday. His last alcohol intake was yesterday  Patient is a 51 y.o. male presenting with abdominal pain. The history is provided by the patient.  Abdominal Pain The primary symptoms of the illness include abdominal pain, nausea and vomiting. The primary symptoms of the illness do not include fever, fatigue, shortness of breath or diarrhea.  Symptoms associated with the illness do not include chills, diaphoresis, hematuria, frequency or back pain.    Past Medical History  Diagnosis Date  . COPD (chronic obstructive pulmonary disease)   . Pancreatitis     History reviewed. No pertinent past surgical history.  No family history on file.  History  Substance Use Topics  . Smoking status: Current Everyday Smoker  . Smokeless tobacco: Not on file  . Alcohol Use: Yes      Review of Systems  Constitutional: Negative for fever, chills, diaphoresis and fatigue.  HENT: Negative for congestion, rhinorrhea and sneezing.   Eyes: Negative.   Respiratory: Negative for cough, chest tightness and shortness of breath.   Cardiovascular: Negative for chest pain and leg swelling.  Gastrointestinal: Positive for nausea, vomiting and abdominal pain. Negative for diarrhea and blood in  stool.  Genitourinary: Negative for frequency, hematuria, flank pain and difficulty urinating.  Musculoskeletal: Negative for back pain and arthralgias.  Skin: Negative for rash.  Neurological: Negative for dizziness, speech difficulty, weakness, numbness and headaches.    Allergies  Review of patient's allergies indicates no known allergies.  Home Medications   Current Outpatient Rx  Name Route Sig Dispense Refill  . IBUPROFEN 200 MG PO TABS Oral Take 600 mg by mouth every 8 (eight) hours as needed. For pain       BP 171/105  Pulse 92  Temp(Src) 98.2 F (36.8 C) (Oral)  Resp 20  SpO2 99%  Physical Exam  Constitutional: He is oriented to person, place, and time. He appears well-developed and well-nourished.  HENT:  Head: Normocephalic and atraumatic.  Eyes: Pupils are equal, round, and reactive to light.  Neck: Normal range of motion. Neck supple.  Cardiovascular: Normal rate, regular rhythm and normal heart sounds.   Pulmonary/Chest: Effort normal and breath sounds normal. No respiratory distress. He has no wheezes. He has no rales. He exhibits no tenderness.  Abdominal: Soft. Bowel sounds are normal. There is tenderness. There is no rebound and no guarding.       Moderate tenderness across the upper abdomen  Musculoskeletal: Normal range of motion. He exhibits no edema.  Lymphadenopathy:    He has no cervical adenopathy.  Neurological: He is alert and oriented to person, place, and time.  Skin: Skin is warm and dry. No rash noted.  Psychiatric: He has a normal mood and affect.    ED Course  Procedures (including  critical care time)  Results for orders placed during the hospital encounter of 02/08/12  URINALYSIS, ROUTINE W REFLEX MICROSCOPIC      Component Value Range   Color, Urine YELLOW  YELLOW    APPearance CLEAR  CLEAR    Specific Gravity, Urine 1.017  1.005 - 1.030    pH 5.5  5.0 - 8.0    Glucose, UA 100 (*) NEGATIVE (mg/dL)   Hgb urine dipstick MODERATE  (*) NEGATIVE    Bilirubin Urine NEGATIVE  NEGATIVE    Ketones, ur 15 (*) NEGATIVE (mg/dL)   Protein, ur 30 (*) NEGATIVE (mg/dL)   Urobilinogen, UA 0.2  0.0 - 1.0 (mg/dL)   Nitrite NEGATIVE  NEGATIVE    Leukocytes, UA NEGATIVE  NEGATIVE   URINE MICROSCOPIC-ADD ON      Component Value Range   Squamous Epithelial / LPF RARE  RARE    RBC / HPF 3-6  <3 (RBC/hpf)   Bacteria, UA RARE  RARE   CBC      Component Value Range   WBC 12.5 (*) 4.0 - 10.5 (K/uL)   RBC 5.03  4.22 - 5.81 (MIL/uL)   Hemoglobin 16.9  13.0 - 17.0 (g/dL)   HCT 78.2  95.6 - 21.3 (%)   MCV 93.2  78.0 - 100.0 (fL)   MCH 33.6  26.0 - 34.0 (pg)   MCHC 36.0  30.0 - 36.0 (g/dL)   RDW 08.6  57.8 - 46.9 (%)   Platelets 169  150 - 400 (K/uL)  DIFFERENTIAL      Component Value Range   Neutrophils Relative 86 (*) 43 - 77 (%)   Neutro Abs 10.7 (*) 1.7 - 7.7 (K/uL)   Lymphocytes Relative 6 (*) 12 - 46 (%)   Lymphs Abs 0.8  0.7 - 4.0 (K/uL)   Monocytes Relative 7  3 - 12 (%)   Monocytes Absolute 0.9  0.1 - 1.0 (K/uL)   Eosinophils Relative 1  0 - 5 (%)   Eosinophils Absolute 0.1  0.0 - 0.7 (K/uL)   Basophils Relative 0  0 - 1 (%)   Basophils Absolute 0.0  0.0 - 0.1 (K/uL)  COMPREHENSIVE METABOLIC PANEL      Component Value Range   Sodium 127 (*) 135 - 145 (mEq/L)   Potassium 3.9  3.5 - 5.1 (mEq/L)   Chloride 90 (*) 96 - 112 (mEq/L)   CO2 23  19 - 32 (mEq/L)   Glucose, Bld 145 (*) 70 - 99 (mg/dL)   BUN 4 (*) 6 - 23 (mg/dL)   Creatinine, Ser 6.29  0.50 - 1.35 (mg/dL)   Calcium 9.8  8.4 - 52.8 (mg/dL)   Total Protein 7.9  6.0 - 8.3 (g/dL)   Albumin 4.4  3.5 - 5.2 (g/dL)   AST 28  0 - 37 (U/L)   ALT 47  0 - 53 (U/L)   Alkaline Phosphatase 88  39 - 117 (U/L)   Total Bilirubin 1.7 (*) 0.3 - 1.2 (mg/dL)   GFR calc non Af Amer >90  >90 (mL/min)   GFR calc Af Amer >90  >90 (mL/min)  LIPASE, BLOOD      Component Value Range   Lipase 752 (*) 11 - 59 (U/L)   No results found.  No results found.   1. Pancreatitis   2.  Alcohol abuse       MDM  Patient with pancreatitis. He was given IV fluids, pain medicine nausea medicine. He is feeling a little bit better  and starting to feel a little bit anxious due to his alcohol withdrawal, but is not tachycardic has no tremors. I spoke with the hospitalist, Dr. Farrel Gordon at [facility] who has accepted the pt for transfer        Rolan Bucco, MD 02/08/12 2106

## 2012-02-09 ENCOUNTER — Encounter (HOSPITAL_COMMUNITY): Payer: Self-pay | Admitting: Internal Medicine

## 2012-02-09 DIAGNOSIS — K861 Other chronic pancreatitis: Secondary | ICD-10-CM

## 2012-02-09 DIAGNOSIS — F1010 Alcohol abuse, uncomplicated: Secondary | ICD-10-CM

## 2012-02-09 DIAGNOSIS — K859 Acute pancreatitis without necrosis or infection, unspecified: Secondary | ICD-10-CM

## 2012-02-09 LAB — COMPREHENSIVE METABOLIC PANEL
ALT: 32 U/L (ref 0–53)
Albumin: 3.2 g/dL — ABNORMAL LOW (ref 3.5–5.2)
Alkaline Phosphatase: 74 U/L (ref 39–117)
Calcium: 8.6 mg/dL (ref 8.4–10.5)
Potassium: 3.3 mEq/L — ABNORMAL LOW (ref 3.5–5.1)
Sodium: 135 mEq/L (ref 135–145)
Total Protein: 6.3 g/dL (ref 6.0–8.3)

## 2012-02-09 LAB — CBC
HCT: 43.8 % (ref 39.0–52.0)
HCT: 42.4 % (ref 39.0–52.0)
Hemoglobin: 15.7 g/dL (ref 13.0–17.0)
Hemoglobin: 14.8 g/dL (ref 13.0–17.0)
MCH: 34.7 pg — ABNORMAL HIGH (ref 26.0–34.0)
MCHC: 34.9 g/dL (ref 30.0–36.0)
MCV: 96.9 fL (ref 78.0–100.0)
MCV: 97.2 fL (ref 78.0–100.0)
RBC: 4.52 MIL/uL (ref 4.22–5.81)
RDW: 13 % (ref 11.5–15.5)

## 2012-02-09 LAB — TSH: TSH: 1.538 u[IU]/mL (ref 0.350–4.500)

## 2012-02-09 LAB — PHOSPHORUS: Phosphorus: 3.8 mg/dL (ref 2.3–4.6)

## 2012-02-09 LAB — MAGNESIUM: Magnesium: 1.6 mg/dL (ref 1.5–2.5)

## 2012-02-09 LAB — CREATININE, SERUM: GFR calc Af Amer: >90 mL/min (ref >90)

## 2012-02-09 MED ORDER — LORAZEPAM 2 MG/ML IJ SOLN: 0.0000 mg | Freq: Two times a day (BID) | INTRAMUSCULAR | Status: DC

## 2012-02-09 MED ORDER — THIAMINE HCL 100 MG PO TABS: 100.0000 mg | ORAL_TABLET | Freq: Every day | ORAL | Status: DC

## 2012-02-09 MED ORDER — POTASSIUM CHLORIDE 2 MEQ/ML IV SOLN
INTRAVENOUS | Status: DC
  Administered 2012-02-09: 11:00:00 via INTRAVENOUS
  Filled 2012-02-09 (×4): qty 1000

## 2012-02-09 MED ORDER — SODIUM CHLORIDE 0.9 % IJ SOLN
3.0000 mL | Freq: Two times a day (BID) | INTRAMUSCULAR | Status: DC
  Administered 2012-02-09: 3 mL via INTRAVENOUS

## 2012-02-09 MED ORDER — LORAZEPAM 1 MG PO TABS
1.0000 mg | ORAL_TABLET | Freq: Four times a day (QID) | ORAL | Status: DC | PRN
  Filled 2012-02-09: qty 1

## 2012-02-09 MED ORDER — VITAMIN B-12 1000 MCG PO TABS: 2000.0000 ug | ORAL_TABLET | Freq: Every day | ORAL | Status: DC

## 2012-02-09 MED ORDER — IPRATROPIUM BROMIDE 0.02 % IN SOLN
0.5000 mg | Freq: Two times a day (BID) | RESPIRATORY_TRACT | Status: DC
  Administered 2012-02-10: 0.5 mg via RESPIRATORY_TRACT
  Filled 2012-02-09: qty 2.5

## 2012-02-09 MED ORDER — NICOTINE 21 MG/24HR TD PT24
21.0000 mg | MEDICATED_PATCH | Freq: Every day | TRANSDERMAL | Status: DC
  Administered 2012-02-09 – 2012-02-10 (×2): 21 mg via TRANSDERMAL
  Filled 2012-02-09 (×2): qty 1

## 2012-02-09 MED ORDER — ALBUTEROL SULFATE (5 MG/ML) 0.5% IN NEBU
2.5000 mg | INHALATION_SOLUTION | Freq: Four times a day (QID) | RESPIRATORY_TRACT | Status: DC
  Administered 2012-02-09 (×4): 2.5 mg via RESPIRATORY_TRACT
  Filled 2012-02-09 (×4): qty 0.5

## 2012-02-09 MED ORDER — LORAZEPAM 2 MG/ML IJ SOLN: 1.0000 mg | Freq: Four times a day (QID) | INTRAMUSCULAR | Status: DC | PRN

## 2012-02-09 MED ORDER — KCL IN DEXTROSE-NACL 20-5-0.9 MEQ/L-%-% IV SOLN
INTRAVENOUS | Status: DC
  Filled 2012-02-09 (×3): qty 1000

## 2012-02-09 MED ORDER — ALBUTEROL SULFATE (5 MG/ML) 0.5% IN NEBU
2.5000 mg | INHALATION_SOLUTION | Freq: Two times a day (BID) | RESPIRATORY_TRACT | Status: DC
  Administered 2012-02-10: 2.5 mg via RESPIRATORY_TRACT
  Filled 2012-02-09: qty 0.5

## 2012-02-09 MED ORDER — HYDROMORPHONE HCL PF 1 MG/ML IJ SOLN
1.0000 mg | INTRAMUSCULAR | Status: DC | PRN
  Administered 2012-02-09 (×2): 1 mg via INTRAVENOUS
  Filled 2012-02-09 (×3): qty 1

## 2012-02-09 MED ORDER — LORAZEPAM 2 MG/ML IJ SOLN
0.0000 mg | Freq: Four times a day (QID) | INTRAMUSCULAR | Status: DC
  Administered 2012-02-10: 2 mg via INTRAVENOUS
  Filled 2012-02-09: qty 1

## 2012-02-09 MED ORDER — OXYCODONE-ACETAMINOPHEN 5-325 MG PO TABS: 1.0000 | ORAL_TABLET | ORAL | Status: DC | PRN

## 2012-02-09 MED ORDER — THIAMINE HCL 100 MG/ML IJ SOLN
Freq: Once | INTRAVENOUS | Status: AC
  Administered 2012-02-09: 02:00:00 via INTRAVENOUS
  Filled 2012-02-09: qty 1000

## 2012-02-09 MED ORDER — FOLIC ACID 1 MG PO TABS: 1.0000 mg | ORAL_TABLET | Freq: Every day | ORAL | Status: DC

## 2012-02-09 MED ORDER — THIAMINE HCL 100 MG/ML IJ SOLN
100.0000 mg | Freq: Every day | INTRAMUSCULAR | Status: DC
  Filled 2012-02-09 (×2): qty 1

## 2012-02-09 MED ORDER — ENOXAPARIN SODIUM 40 MG/0.4ML ~~LOC~~ SOLN
40.0000 mg | SUBCUTANEOUS | Status: DC
  Administered 2012-02-09: 40 mg via SUBCUTANEOUS
  Filled 2012-02-09 (×2): qty 0.4

## 2012-02-09 MED ORDER — SODIUM CHLORIDE 0.9 % IV SOLN
INTRAVENOUS | Status: DC
  Administered 2012-02-09: 17:00:00 via INTRAVENOUS
  Filled 2012-02-09 (×2): qty 1000

## 2012-02-09 MED ORDER — HYDROMORPHONE HCL PF 1 MG/ML IJ SOLN
1.0000 mg | INTRAMUSCULAR | Status: DC | PRN
  Administered 2012-02-09 – 2012-02-10 (×4): 1 mg via INTRAVENOUS
  Filled 2012-02-09 (×3): qty 1

## 2012-02-09 MED ORDER — ALBUTEROL SULFATE (5 MG/ML) 0.5% IN NEBU: 2.5000 mg | INHALATION_SOLUTION | RESPIRATORY_TRACT | Status: DC | PRN

## 2012-02-09 MED ORDER — IPRATROPIUM BROMIDE 0.02 % IN SOLN
0.5000 mg | Freq: Four times a day (QID) | RESPIRATORY_TRACT | Status: DC
  Administered 2012-02-09 (×4): 0.5 mg via RESPIRATORY_TRACT
  Filled 2012-02-09 (×4): qty 2.5

## 2012-02-09 MED ORDER — OXYCODONE-ACETAMINOPHEN 5-325 MG PO TABS
1.0000 | ORAL_TABLET | ORAL | Status: DC | PRN
  Administered 2012-02-09 – 2012-02-10 (×4): 2 via ORAL
  Filled 2012-02-09 (×5): qty 2

## 2012-02-09 MED ORDER — VITAMIN B-1 100 MG PO TABS
100.0000 mg | ORAL_TABLET | Freq: Every day | ORAL | Status: DC
  Administered 2012-02-09 – 2012-02-10 (×2): 100 mg via ORAL
  Filled 2012-02-09 (×2): qty 1

## 2012-02-09 MED ORDER — FOLIC ACID 1 MG PO TABS
1.0000 mg | ORAL_TABLET | Freq: Every day | ORAL | Status: DC
  Administered 2012-02-09 – 2012-02-10 (×2): 1 mg via ORAL
  Filled 2012-02-09 (×2): qty 1

## 2012-02-09 MED ORDER — ADULT MULTIVITAMIN W/MINERALS CH
1.0000 | ORAL_TABLET | Freq: Every day | ORAL | Status: DC
  Administered 2012-02-09 – 2012-02-10 (×2): 1 via ORAL
  Filled 2012-02-09 (×2): qty 1

## 2012-02-09 NOTE — Progress Notes (Signed)
Patient ID: [name], male   DOB: [birth year], 51 y.o.   MRN: [id number] Subjective: My stomach hurts, the pain medicine only lasts 3  Hours.  Objective: Weight change:   Intake/Output Summary (Last 24 hours) at 02/09/12 0731 Last data filed at 02/09/12 0600  Gross per 24 hour  Intake 785.42 ml  Output   1000 ml  Net -214.58 ml   Blood pressure 139/90, pulse 84, temperature 97.9 F (36.6 C), temperature source Oral, resp. rate 20, height 6\' 1"  (1.854 m), weight 84.5 kg (186 lb 4.6 oz), SpO2 95.00%. Filed Vitals:   02/08/12 2259 02/09/12 0121 02/09/12 0201 02/09/12 0517  BP: 159/96 156/99  139/90  Pulse: 102 88  84  Temp: 97.9 F (36.6 C) 98.4 F (36.9 C)  97.9 F (36.6 C)  TempSrc: Oral Oral  Oral  Resp: 20 18  20   Height: 6\' 1"  (1.854 m)     Weight: 84.5 kg (186 lb 4.6 oz)     SpO2: 94% 93% 99% 95%    Physical Exam: General: No acute distress Lungs: slight crackles posteriorly. Cardiovascular: Regular rate and rhythm without murmur gallop or rub normal S1 and S2 Abdomen: Tender to palp in LUQ. Distended.  Not tight, bowel sounds positive, no rebound, no ascites, no appreciable mass Extremities: No significant cyanosis, clubbing, or edema bilateral lower extremities  Basic Metabolic Panel:  Lab 02/09/12 7829 02/09/12 0116 02/08/12 1948  NA 135 -- 127*  K 3.3* -- 3.9  CL 100 -- 90*  CO2 26 -- 23  GLUCOSE 135* -- 145*  BUN 5* -- 4*  CREATININE 0.61 0.59 --  CALCIUM 8.6 -- 9.8  MG -- 1.6 --  PHOS -- 3.8 --   Liver Function Tests:  Lab 02/09/12 0600 02/08/12 1948  AST 17 28  ALT 32 47  ALKPHOS 74 88  BILITOT 1.5* 1.7*  PROT 6.3 7.9  ALBUMIN 3.2* 4.4    Lab 02/09/12 0600 02/08/12 1948  LIPASE 283* 752*  AMYLASE -- --   No results found for this basename: AMMONIA:2 in the last 168 hours CBC:  Lab 02/09/12 0600 02/09/12 0116 02/08/12 1948  WBC 8.7 9.2 --  NEUTROABS -- -- 10.7*  HGB 14.8 15.7 --  HCT 42.4 43.8 --  MCV 97.2 96.9 --  PLT 156  161 --   Urine Drug Screen: Drugs of Abuse     Component Value Date/Time   LABOPIA POSITIVE* 09/12/2010 1526   COCAINSCRNUR NONE DETECTED 09/12/2010 1526   LABBENZ NONE DETECTED 09/12/2010 1526   AMPHETMU NONE DETECTED 09/12/2010 1526   THCU NONE DETECTED 09/12/2010 1526   LABBARB  Value: NONE DETECTED        DRUG SCREEN FOR MEDICAL PURPOSES ONLY.  IF CONFIRMATION IS NEEDED FOR ANY PURPOSE, NOTIFY LAB WITHIN 5 DAYS.        LOWEST DETECTABLE LIMITS FOR URINE DRUG SCREEN Drug Class       Cutoff (ng/mL) Amphetamine      1000 Barbiturate      200 Benzodiazepine   200 Tricyclics       300 Opiates          300 Cocaine          300 THC              50 09/12/2010 1526     Studies/Results: Scheduled Meds:   . albuterol  2.5 mg Nebulization Q6H  . antiseptic oral rinse  15 mL Mouth Rinse q12n4p  .  chlorhexidine  15 mL Mouth Rinse BID  . dextrose 5 % and 0.45% NaCl   Intravenous STAT  . enoxaparin  40 mg Subcutaneous Q24H  . folic acid  1 mg Oral Daily  .  HYDROmorphone (DILAUDID) injection  1 mg Intravenous Once  . HYDROmorphone  1 mg Intravenous Once  . ipratropium  0.5 mg Nebulization Q6H  . LORazepam  0-4 mg Intravenous Q6H   Followed by  . LORazepam  0-4 mg Intravenous Q12H  . mulitivitamin with minerals  1 tablet Oral Daily  . nicotine  21 mg Transdermal Daily  . ondansetron (ZOFRAN) IV  4 mg Intravenous Once  . general admission iv infusion   Intravenous Once  . sodium chloride  1,000 mL Intravenous Once  . sodium chloride  3 mL Intravenous Q12H  . thiamine  100 mg Oral Daily   Or  . thiamine  100 mg Intravenous Daily   Continuous Infusions:   . dextrose 5 % and 0.9 % NaCl with KCl 20 mEq/L     PRN Meds:.albuterol, HYDROmorphone, LORazepam, LORazepam  Anti-infectives:  Anti-infectives    None      Assessment/Plan: Principal Problem:  *Pancreatitis Active Problems:  TOBACCO ABUSE  COPD  Alcohol abuse   1. 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   Bowel rest, Control NV, Hydrate and follow.  Pain control Dilaudid 1 mg q 3 for severe pain., Percocet for moderate pain. Lipase levels   2. Alcoholism:  CIWA protocol, counseled to quit.  3. Dehydration: fluid resuscitation   4. Tobacco abuse: Counseling and Nicotine patches  5.  Hypokalemia:  Mild replete.    LOS: 1 day      DVT Prophylaxis   Stephani Police 02/09/2012, 7:31 AM [phone]  Attending Patient seen and examined, agree with assessment and plan as outlined by Ms Elyn Peers, Feels better-will start clear liquids today, mildly tremulous-will need to watch for ETOH withdrawal. Continue with Ativan per CIWA.  Dr Windell Norfolk

## 2012-02-09 NOTE — Plan of Care (Signed)
Problem: Phase I Progression Outcomes Goal: Other Phase I Outcomes/Goals Pancreatitis education information given

## 2012-02-09 NOTE — Progress Notes (Signed)
02/09/2012 [name] SPARKS Case Management Note [phone]  Utilization review completed.  

## 2012-02-09 NOTE — Progress Notes (Signed)
Clinical Social Work Department BRIEF PSYCHOSOCIAL ASSESSMENT 02/09/2012  Patient:  [name], [name]     Account Number:  [id number]     Admit date:  02/08/2012  Clinical Social Worker:  Margaree Mackintosh  Date/Time:  02/09/2012 02:12 PM  Referred by:  Physician  Date Referred:  02/09/2012 Referred for  Substance Abuse   Other Referral:   Interview type:  Patient Other interview type:   with mother in rom    PSYCHOSOCIAL DATA Living Status:  SIGNIFICANT OTHER Admitted from facility:   Level of care:   Primary support name:  Mary Primary support relationship to patient:  PARENT Degree of support available:   Adequate.    CURRENT CONCERNS Current Concerns  Substance Abuse   Other Concerns:    SOCIAL WORK ASSESSMENT / PLAN Clinical Social Worker met with pt in room; pt provided permission for CSW to speak openly with pt's mother present. CSW introduced self and explained role.  CSW provided opportunity for pt to process diffiuclt feelings related to hospital admission.   Pt shared that he has consumed 6-9 bottles of beer daily for approximately 20 years.  Pt continued to process this information with CSW. CSW observed/reviewed several strengths of pt; pt has been able to cease use of cocaine, pt has a strong support system, pt is able to list and rank personal goals.  CSW offered several options to pt regarding achieving his own goals; pt opted to recieve resource information to the community.  Pt stated he would review resource information on his own.  CSW validated the inherent strength with choosing to work on personal goals.    CSW to sign off at this time, please re consult if needed.   Assessment/plan status:   Other assessment/ plan:   Information/referral to community resources:    PATIENT'S/FAMILY'S RESPONSE TO PLAN OF CARE:         Angelia Mould, MSW, LCSWA [phone]

## 2012-02-09 NOTE — H&P (Signed)
[name] is an 51 y.o. male.   Chief Complaint: Abdominal Pain HPI: 51 yo with history of excessive alcohol intake who was seen in [facility] with abdominal pain, nausea and some vomiting. Patient has symptoms for 3 days. Was taking more and more Motrin but no relief instead was getting worse. Has 9/10 pain, epigastric region, no radiation. He also smokes excessively.  Past Medical History  Diagnosis Date  . COPD (chronic obstructive pulmonary disease)   . Pancreatitis     Past Surgical History  Procedure Date  . No past surgeries     History reviewed. No pertinent family history. Social History:  reports that he has been smoking.  He does not have any smokeless tobacco history on file. He reports that he drinks alcohol. He reports that he does not use illicit drugs.  Allergies: No Known Allergies  Medications Prior to Admission  Medication Dose Route Frequency Provider Last Rate Last Dose  . albuterol (PROVENTIL) (5 MG/ML) 0.5% nebulizer solution 2.5 mg  2.5 mg Nebulization Q6H Rometta Emery, MD   2.5 mg at 02/09/12 0159  . albuterol (PROVENTIL) (5 MG/ML) 0.5% nebulizer solution 2.5 mg  2.5 mg Nebulization Q2H PRN Rometta Emery, MD      . antiseptic oral rinse (BIOTENE) solution 15 mL  15 mL Mouth Rinse q12n4p Nishant Dhungel, MD      . chlorhexidine (PERIDEX) 0.12 % solution 15 mL  15 mL Mouth Rinse BID Nishant Dhungel, MD   15 mL at 02/08/12 2350  . dextrose 5 % and 0.9 % NaCl with KCl 20 mEq/L infusion   Intravenous Continuous Rometta Emery, MD      . dextrose 5 %-0.45 % sodium chloride infusion   Intravenous STAT Rolan Bucco, MD      . enoxaparin (LOVENOX) injection 40 mg  40 mg Subcutaneous Q24H Rometta Emery, MD      . folic acid (FOLVITE) tablet 1 mg  1 mg Oral Daily Rometta Emery, MD      . HYDROmorphone (DILAUDID) injection 1 mg  1 mg Intravenous Once Rolan Bucco, MD   1 mg at 02/08/12 2014  . HYDROmorphone (DILAUDID) injection 1 mg  1 mg Intravenous  Once Rolan Bucco, MD   1 mg at 02/08/12 2142  . HYDROmorphone (DILAUDID) injection 1 mg  1 mg Intravenous Q4H PRN Rometta Emery, MD   1 mg at 02/09/12 0522  . ipratropium (ATROVENT) nebulizer solution 0.5 mg  0.5 mg Nebulization Q6H Rometta Emery, MD   0.5 mg at 02/09/12 0159  . LORazepam (ATIVAN) injection 0-4 mg  0-4 mg Intravenous Q6H Rometta Emery, MD       Followed by  . LORazepam (ATIVAN) injection 0-4 mg  0-4 mg Intravenous Q12H Rometta Emery, MD      . LORazepam (ATIVAN) tablet 1 mg  1 mg Oral Q6H PRN Rometta Emery, MD       Or  . LORazepam (ATIVAN) injection 1 mg  1 mg Intravenous Q6H PRN Rometta Emery, MD      . mulitivitamin with minerals tablet 1 tablet  1 tablet Oral Daily Rometta Emery, MD      . nicotine (NICODERM CQ - dosed in mg/24 hours) patch 21 mg  21 mg Transdermal Daily Rometta Emery, MD      . ondansetron (ZOFRAN) injection 4 mg  4 mg Intravenous Once Rolan Bucco, MD   4 mg at 02/08/12 2014  .  sodium chloride 0.9 % 1,000 mL with thiamine 100 mg, folic acid 1 mg, multivitamins adult 10 mL infusion   Intravenous Once Rometta Emery, MD 125 mL/hr at 02/09/12 0219    . sodium chloride 0.9 % bolus 1,000 mL  1,000 mL Intravenous Once Rolan Bucco, MD   1,000 mL at 02/08/12 2014  . sodium chloride 0.9 % injection 3 mL  3 mL Intravenous Q12H Rometta Emery, MD      . thiamine (VITAMIN B-1) tablet 100 mg  100 mg Oral Daily Rometta Emery, MD       Or  . thiamine (B-1) injection 100 mg  100 mg Intravenous Daily Rometta Emery, MD       Medications Prior to Admission  Medication Sig Dispense Refill  . ibuprofen (ADVIL,MOTRIN) 200 MG tablet Take 600 mg by mouth every 8 (eight) hours as needed. For pain         Results for orders placed during the hospital encounter of 02/08/12 (from the past 48 hour(s))  URINALYSIS, ROUTINE W REFLEX MICROSCOPIC     Status: Abnormal   Collection Time   02/08/12  7:23 PM      Component Value Range Comment    Color, Urine YELLOW  YELLOW     APPearance CLEAR  CLEAR     Specific Gravity, Urine 1.017  1.005 - 1.030     pH 5.5  5.0 - 8.0     Glucose, UA 100 (*) NEGATIVE (mg/dL)    Hgb urine dipstick MODERATE (*) NEGATIVE     Bilirubin Urine NEGATIVE  NEGATIVE     Ketones, ur 15 (*) NEGATIVE (mg/dL)    Protein, ur 30 (*) NEGATIVE (mg/dL)    Urobilinogen, UA 0.2  0.0 - 1.0 (mg/dL)    Nitrite NEGATIVE  NEGATIVE     Leukocytes, UA NEGATIVE  NEGATIVE    URINE MICROSCOPIC-ADD ON     Status: Normal   Collection Time   02/08/12  7:23 PM      Component Value Range Comment   Squamous Epithelial / LPF RARE  RARE     RBC / HPF 3-6  <3 (RBC/hpf)    Bacteria, UA RARE  RARE    CBC     Status: Abnormal   Collection Time   02/08/12  7:48 PM      Component Value Range Comment   WBC 12.5 (*) 4.0 - 10.5 (K/uL)    RBC 5.03  4.22 - 5.81 (MIL/uL)    Hemoglobin 16.9  13.0 - 17.0 (g/dL)    HCT 98.1  19.1 - 47.8 (%)    MCV 93.2  78.0 - 100.0 (fL)    MCH 33.6  26.0 - 34.0 (pg)    MCHC 36.0  30.0 - 36.0 (g/dL)    RDW 29.5  62.1 - 30.8 (%)    Platelets 169  150 - 400 (K/uL)   DIFFERENTIAL     Status: Abnormal   Collection Time   02/08/12  7:48 PM      Component Value Range Comment   Neutrophils Relative 86 (*) 43 - 77 (%)    Neutro Abs 10.7 (*) 1.7 - 7.7 (K/uL)    Lymphocytes Relative 6 (*) 12 - 46 (%)    Lymphs Abs 0.8  0.7 - 4.0 (K/uL)    Monocytes Relative 7  3 - 12 (%)    Monocytes Absolute 0.9  0.1 - 1.0 (K/uL)    Eosinophils Relative 1  0 -  5 (%)    Eosinophils Absolute 0.1  0.0 - 0.7 (K/uL)    Basophils Relative 0  0 - 1 (%)    Basophils Absolute 0.0  0.0 - 0.1 (K/uL)   COMPREHENSIVE METABOLIC PANEL     Status: Abnormal   Collection Time   02/08/12  7:48 PM      Component Value Range Comment   Sodium 127 (*) 135 - 145 (mEq/L)    Potassium 3.9  3.5 - 5.1 (mEq/L)    Chloride 90 (*) 96 - 112 (mEq/L)    CO2 23  19 - 32 (mEq/L)    Glucose, Bld 145 (*) 70 - 99 (mg/dL)    BUN 4 (*) 6 - 23 (mg/dL)     Creatinine, Ser 4.09  0.50 - 1.35 (mg/dL)    Calcium 9.8  8.4 - 10.5 (mg/dL)    Total Protein 7.9  6.0 - 8.3 (g/dL)    Albumin 4.4  3.5 - 5.2 (g/dL)    AST 28  0 - 37 (U/L)    ALT 47  0 - 53 (U/L)    Alkaline Phosphatase 88  39 - 117 (U/L)    Total Bilirubin 1.7 (*) 0.3 - 1.2 (mg/dL)    GFR calc non Af Amer >90  >90 (mL/min)    GFR calc Af Amer >90  >90 (mL/min)   LIPASE, BLOOD     Status: Abnormal   Collection Time   02/08/12  7:48 PM      Component Value Range Comment   Lipase 752 (*) 11 - 59 (U/L)   CBC     Status: Abnormal   Collection Time   02/09/12  1:16 AM      Component Value Range Comment   WBC 9.2  4.0 - 10.5 (K/uL)    RBC 4.52  4.22 - 5.81 (MIL/uL)    Hemoglobin 15.7  13.0 - 17.0 (g/dL)    HCT 81.1  91.4 - 78.2 (%)    MCV 96.9  78.0 - 100.0 (fL)    MCH 34.7 (*) 26.0 - 34.0 (pg)    MCHC 35.8  30.0 - 36.0 (g/dL)    RDW 95.6  21.3 - 08.6 (%)    Platelets 161  150 - 400 (K/uL)   CREATININE, SERUM     Status: Normal   Collection Time   02/09/12  1:16 AM      Component Value Range Comment   Creatinine, Ser 0.59  0.50 - 1.35 (mg/dL)    GFR calc non Af Amer >90  >90 (mL/min)    GFR calc Af Amer >90  >90 (mL/min)   MAGNESIUM     Status: Normal   Collection Time   02/09/12  1:16 AM      Component Value Range Comment   Magnesium 1.6  1.5 - 2.5 (mg/dL)   PHOSPHORUS     Status: Normal   Collection Time   02/09/12  1:16 AM      Component Value Range Comment   Phosphorus 3.8  2.3 - 4.6 (mg/dL)   CBC     Status: Normal   Collection Time   02/09/12  6:00 AM      Component Value Range Comment   WBC 8.7  4.0 - 10.5 (K/uL)    RBC 4.36  4.22 - 5.81 (MIL/uL)    Hemoglobin 14.8  13.0 - 17.0 (g/dL)    HCT 57.8  46.9 - 62.9 (%)    MCV 97.2  78.0 - 100.0 (  fL)    MCH 33.9  26.0 - 34.0 (pg)    MCHC 34.9  30.0 - 36.0 (g/dL)    RDW 16.1  09.6 - 04.5 (%)    Platelets 156  150 - 400 (K/uL)    No results found.  Review of Systems  Constitutional: Positive for diaphoresis.    Gastrointestinal: Positive for nausea, abdominal pain and diarrhea. Negative for heartburn, constipation, blood in stool and melena.  All other systems reviewed and are negative.    Blood pressure 139/90, pulse 84, temperature 97.9 F (36.6 C), temperature source Oral, resp. rate 20, height 6\' 1"  (1.854 m), weight 84.5 kg (186 lb 4.6 oz), SpO2 95.00%. Physical Exam  Constitutional: He is oriented to person, place, and time. He appears well-developed and well-nourished.  HENT:  Head: Normocephalic and atraumatic.  Right Ear: External ear normal.  Left Ear: External ear normal.  Nose: Nose normal.  Mouth/Throat: Oropharynx is clear and moist.  Eyes: Conjunctivae and EOM are normal. Pupils are equal, round, and reactive to light.  Neck: Normal range of motion. Neck supple.  Cardiovascular: Normal rate, regular rhythm, normal heart sounds and intact distal pulses.   Respiratory: Effort normal and breath sounds normal.  GI: He exhibits distension. He exhibits no mass. There is tenderness. There is no rebound and no guarding.  Musculoskeletal: Normal range of motion.  Neurological: He is alert and oriented to person, place, and time. He has normal reflexes.  Skin: Skin is warm and dry.  Psychiatric: He has a normal mood and affect. His behavior is normal. Judgment and thought content normal.     Assessment/Plan 1. Acute pancreatitis: Alcoholic most likely. Admit, Bowel rest, Control NV, Hydrate and follow Lipase levels 2. Alcoholism: Will initiate CIWA protocol 3. Dehydration: fluid resuscitation 4. Tobacco abuse: Counseling and Nicotine patches  Karyna Bessler,LAWAL 02/09/2012, 6:38 AM

## 2012-02-09 NOTE — Progress Notes (Signed)
   CARE MANAGEMENT NOTE 02/09/2012  Patient:  [name], [name]   Account Number:  [id number]  Date Initiated:  02/09/2012  Documentation initiated by:  Donn Pierini  Subjective/Objective Assessment:   Pt admitted with pancreatitis     Action/Plan:   PTA pt lived at home with girlfriend- independent with ADLs   Anticipated DC Date:  02/11/2012   Anticipated DC Plan:  HOME/SELF CARE      DC Planning Services  CM consult      Choice offered to / List presented to:             Status of service:  In process, will continue to follow Medicare Important Message given?   (If response is "NO", the following Medicare IM given date fields will be blank) Date Medicare IM given:   Date Additional Medicare IM given:    Discharge Disposition:    Per UR Regulation:    If discussed at Long Length of Stay Meetings, dates discussed:    Comments:  02/09/12- 1515- Donn Pierini RN, BSN [phone] Spoke with pt at bedside- per conversation pt states that he does not have a PCP at present time- does not want to be set up with one currently- Information verbally given to pt about [employer]- and let pt know if he changes his mind can provide him with information. Pt states that he is not currently on any prescribed medications- uses [facility] if needed. Pt reports that he will have transportation at discharge- no d/c needs anticipated - CM to follow

## 2012-02-10 LAB — BASIC METABOLIC PANEL
BUN: 5 mg/dL — ABNORMAL LOW (ref 6–23)
CO2: 28 mEq/L (ref 19–32)
Calcium: 8.7 mg/dL (ref 8.4–10.5)
Creatinine, Ser: 0.67 mg/dL (ref 0.50–1.35)
GFR calc non Af Amer: >90 mL/min (ref >90)
Glucose, Bld: 117 mg/dL — ABNORMAL HIGH (ref 70–99)

## 2012-02-10 MED ORDER — FOLIC ACID 1 MG PO TABS: 1.0000 mg | ORAL_TABLET | Freq: Every day | ORAL | Status: DC

## 2012-02-10 MED ORDER — THIAMINE HCL 100 MG PO TABS: 100.0000 mg | ORAL_TABLET | Freq: Every day | ORAL | Status: DC

## 2012-02-10 MED ORDER — ADULT MULTIVITAMIN W/MINERALS CH: 1.0000 | ORAL_TABLET | Freq: Every day | ORAL | Status: DC

## 2012-02-10 MED ORDER — OXYCODONE-ACETAMINOPHEN 5-325 MG PO TABS: 1.0000 | ORAL_TABLET | Freq: Four times a day (QID) | ORAL | Status: AC | PRN

## 2012-02-10 MED ORDER — VITAMIN B-12 1000 MCG PO TABS: 2000.0000 ug | ORAL_TABLET | Freq: Every day | ORAL | Status: DC

## 2012-02-10 NOTE — Progress Notes (Signed)
Pt to be discharged today home. IV site removed and discharge instructions reviewed with patient. Nilton Lave,RN.

## 2012-02-10 NOTE — Discharge Summary (Signed)
PATIENT DETAILS Name: [name] Age: 51 y.o. Sex: male Date of Birth: 1961/05/31 MRN: [id number]. Admit Date: 02/08/2012 Admitting Physician: Rometta Emery, MD PCP:No primary provider on file.  PRIMARY DISCHARGE DIAGNOSIS:  Principal Problem:  *Pancreatitis Active Problems:  TOBACCO ABUSE  COPD  Alcohol abuse      PAST MEDICAL HISTORY: Past Medical History  Diagnosis Date  . COPD (chronic obstructive pulmonary disease)   . Pancreatitis     DISCHARGE MEDICATIONS: Medication List  As of 02/10/2012 10:18 AM   STOP taking these medications         diphenhydrAMINE 50 MG capsule      fish oil-omega-3 fatty acids 1000 MG capsule      ibuprofen 200 MG tablet         TAKE these medications         folic acid 1 MG tablet   Commonly known as: FOLVITE   Take 1 tablet (1 mg total) by mouth daily.      mulitivitamin with minerals Tabs   Take 1 tablet by mouth daily.      oxyCODONE-acetaminophen 5-325 MG per tablet   Commonly known as: PERCOCET   Take 1-2 tablets by mouth every 6 (six) hours as needed.      thiamine 100 MG tablet   Take 1 tablet (100 mg total) by mouth daily.      vitamin B-12 1000 MCG tablet   Commonly known as: CYANOCOBALAMIN   Take 2 tablets (2,000 mcg total) by mouth daily.             BRIEF HPI:  See H&P, Labs, Consult and Test reports for all details in brief, patient was admitted for 51 yo with history of excessive alcohol intake who was seen in [facility] with abdominal pain, nausea and some vomiting.   CONSULTATIONS:   None  PERTINENT RADIOLOGIC STUDIES: No results found.   PERTINENT LAB RESULTS: CBC:  Basename 02/09/12 0600 02/09/12 0116  WBC 8.7 9.2  HGB 14.8 15.7  HCT 42.4 43.8  PLT 156 161   CMET CMP     Component Value Date/Time   NA 138 02/10/2012 0630   K 3.5 02/10/2012 0630   CL 101 02/10/2012 0630   CO2 28 02/10/2012 0630   GLUCOSE 117* 02/10/2012 0630   BUN 5* 02/10/2012 0630   CREATININE 0.67 02/10/2012  0630   CALCIUM 8.7 02/10/2012 0630   PROT 6.3 02/09/2012 0600   ALBUMIN 3.2* 02/09/2012 0600   AST 17 02/09/2012 0600   ALT 32 02/09/2012 0600   ALKPHOS 74 02/09/2012 0600   BILITOT 1.5* 02/09/2012 0600   GFRNONAA >90 02/10/2012 0630   GFRAA >90 02/10/2012 0630    GFR Estimated Creatinine Clearance: 120.7 ml/min (by C-G formula based on Cr of 0.67).  Basename 02/09/12 0600 02/08/12 1948  LIPASE 283* 752*  AMYLASE -- --   No results found for this basename: CKTOTAL:3,CKMB:3,CKMBINDEX:3,TROPONINI:3 in the last 72 hours No components found with this basename: POCBNP:3 No results found for this basename: DDIMER:2 in the last 72 hours No results found for this basename: HGBA1C:2 in the last 72 hours No results found for this basename: CHOL:2,HDL:2,LDLCALC:2,TRIG:2,CHOLHDL:2,LDLDIRECT:2 in the last 72 hours  Basename 02/09/12 0116  TSH 1.538  T4TOTAL --  T3FREE --  THYROIDAB --   No results found for this basename: VITAMINB12:2,FOLATE:2,FERRITIN:2,TIBC:2,IRON:2,RETICCTPCT:2 in the last 72 hours Coags: No results found for this basename: PT:2,INR:2 in the last 72 hours Microbiology: No results found for this or  any previous visit (from the past 240 hour(s)).   BRIEF HOSPITAL COURSE:  Principal Problem:  *Pancreatitis Secondary to ETOH, this is his 3rd episode Patient was admitted, placed NPO, abdominal pain slowly decreased, he was started on clear liquids and diet was slowly advanced, this morning he has tolerated a low fat diet, and is requesting he be discharged. Abdomen is soft and currently patient is pain free.   TOBACCO ABUSE -he has been counseled extensively to quit   Alcohol abuse  -he has been counseled by me yesterday and today prior to discharge about the importance of completely quitting ETOH, risks of chronic pancreatitis and Liver Cirrhosis have been extensively discussed,he claims understanding   TODAY-DAY OF DISCHARGE:  Subjective:   [name] today has  no headache,no chest abdominal pain,no new weakness tingling or numbness, feels much better wants to go home today. He has tolerated a low fat diet and is anxious to go home  Objective:   Blood pressure 138/97, pulse 82, temperature 97.4 F (36.3 C), temperature source Oral, resp. rate 20, height 6\' 1"  (1.854 m), weight 84.5 kg (186 lb 4.6 oz), SpO2 94.00%.  Intake/Output Summary (Last 24 hours) at 02/10/12 1018 Last data filed at 02/10/12 0600  Gross per 24 hour  Intake 5225.83 ml  Output      0 ml  Net 5225.83 ml    Exam Awake Alert, Oriented *3, No new F.N deficits, Normal affect Progress.AT,PERRAL Supple Neck,No JVD, No cervical lymphadenopathy appriciated.  Symmetrical Chest wall movement, Good air movement bilaterally, CTAB RRR,No Gallops,Rubs or new Murmurs, No Parasternal Heave +ve B.Sounds, Abd Soft, Non tender, No organomegaly appriciated, No rebound -guarding or rigidity. No Cyanosis, Clubbing or edema, No new Rash or bruise  DISPOSITION: Home   DISCHARGE INSTRUCTIONS:     -low fat diet  Follow-up Information    Please follow up. (call [facility] for apptointment-at your convenience)           Total Time spent on discharge equals 45 minutes.  SignedJeoffrey Massed 02/10/2012 10:18 AM

## 2012-07-31 IMAGING — US US ABDOMEN PORT
1 series · 14 of 25 positions shown · non-contrast
Comparison: CT scan performed September 12, 2010

CLINICAL DATA: Pancreatitis

ABDOMINAL ULTRASOUND COMPLETE

[Series 1: us abdomen port · 0.30mm/px · 14 of 80 slices shown]
[im 1/80]
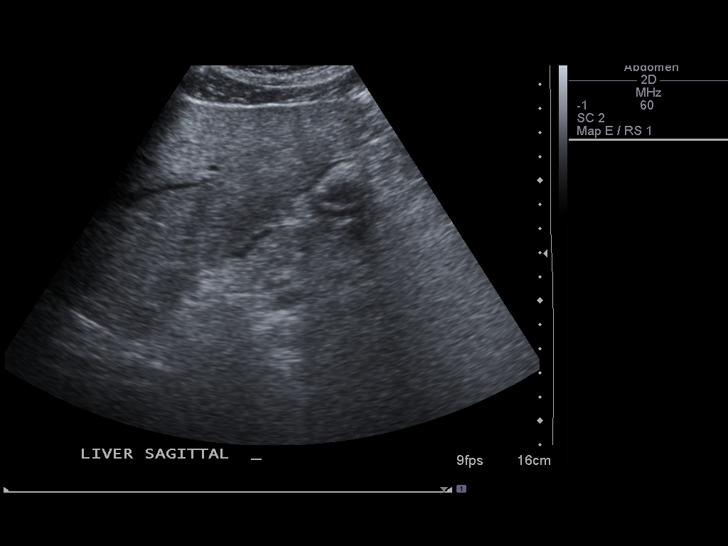
[im 7/80]
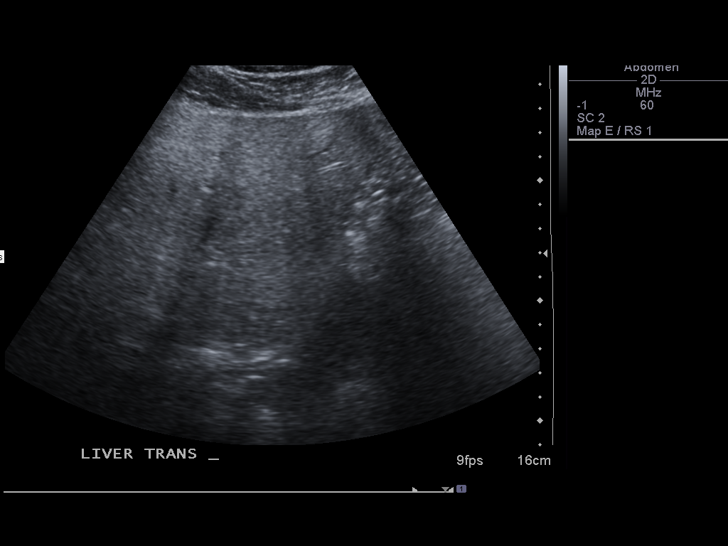
[im 14/80]
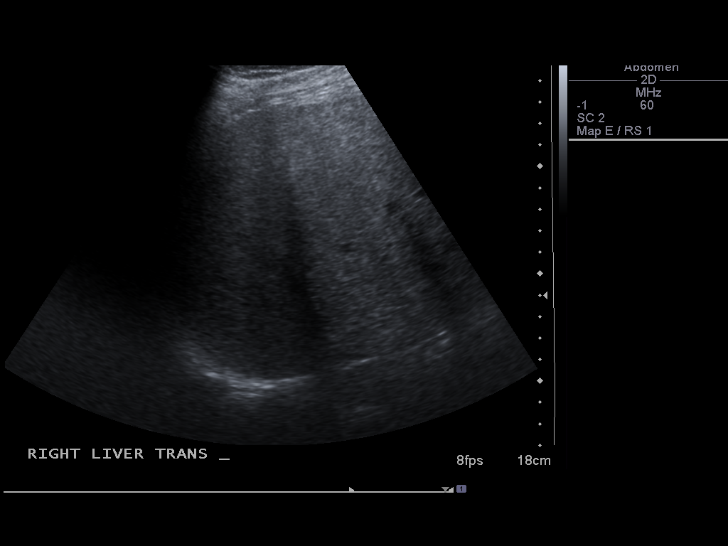
[im 20/80]
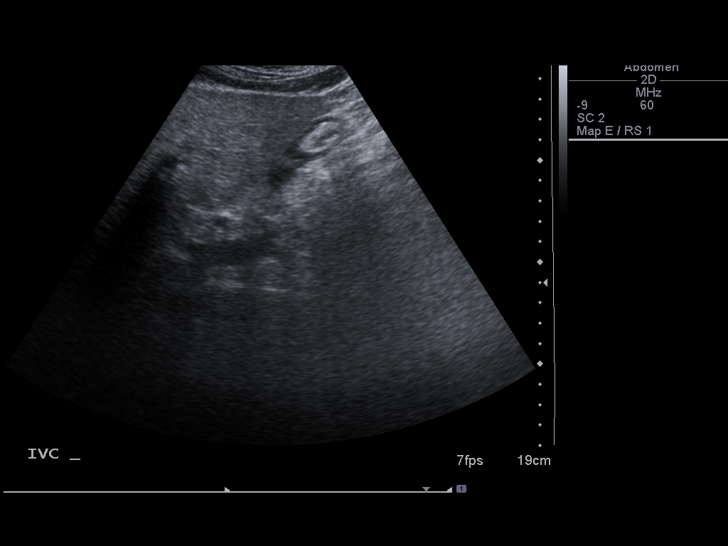
[im 27/80]
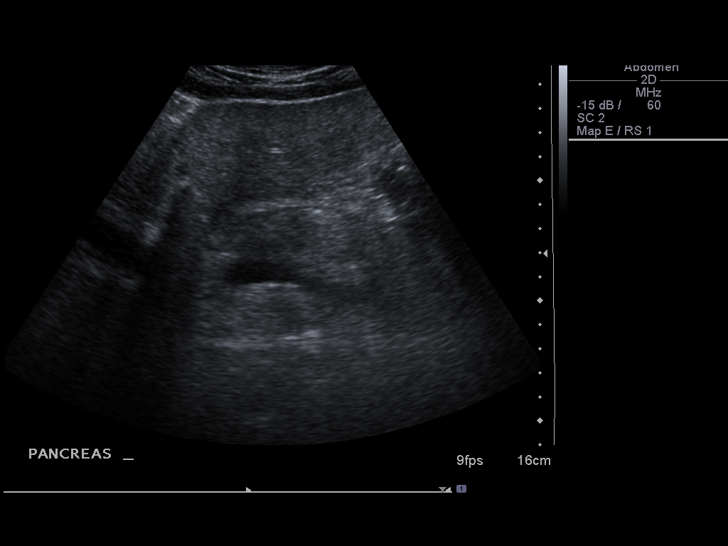
[im 30/80]
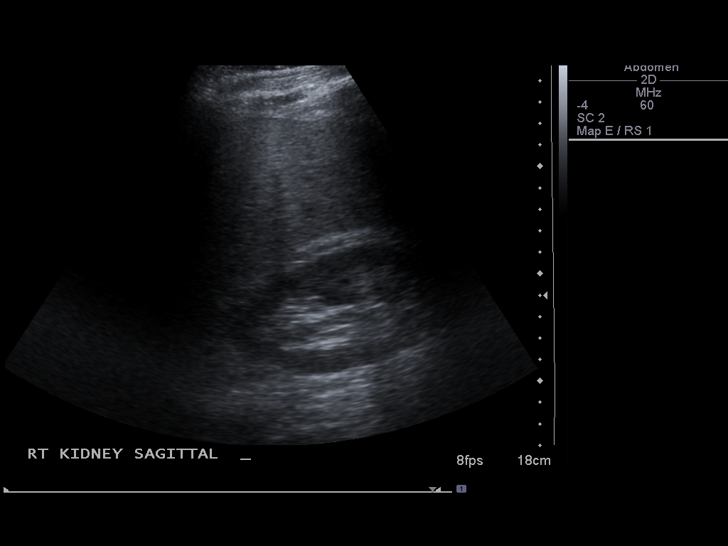
[im 37/80]
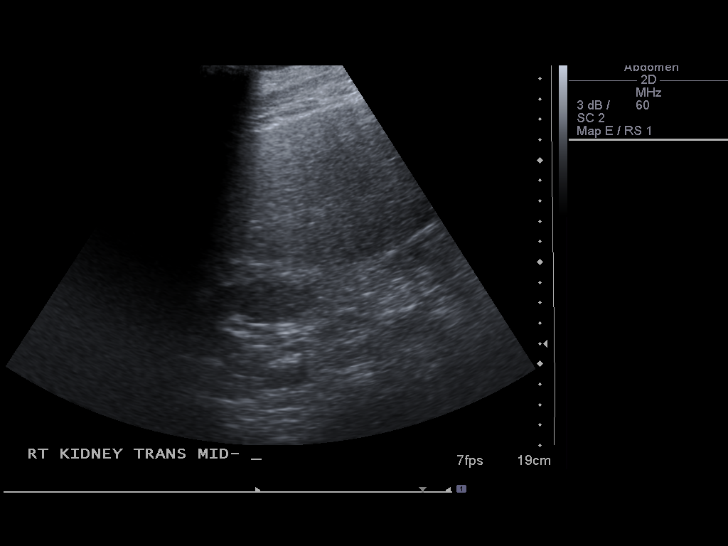
[im 43/80]
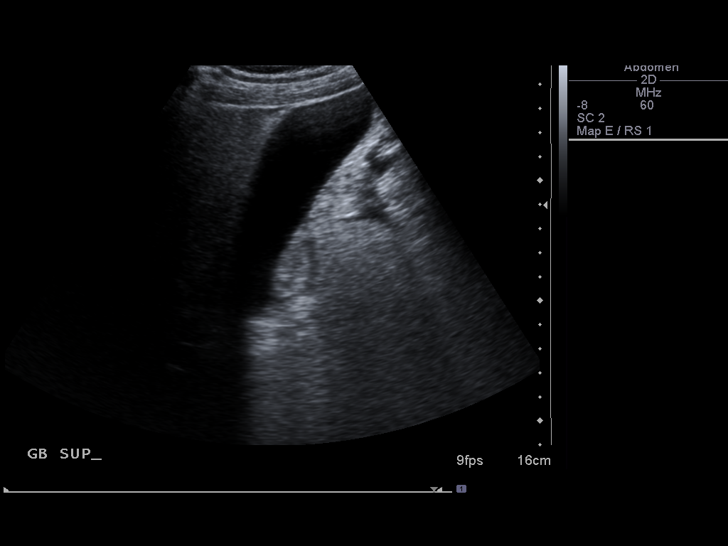
[im 50/80]
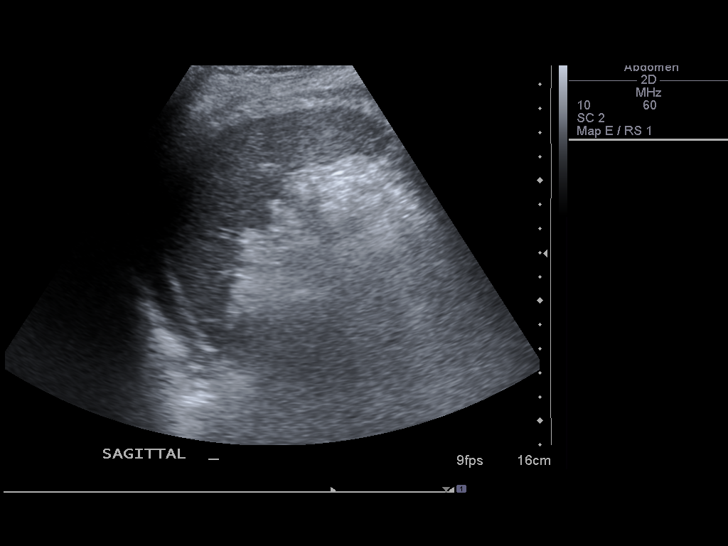
[im 53/80]
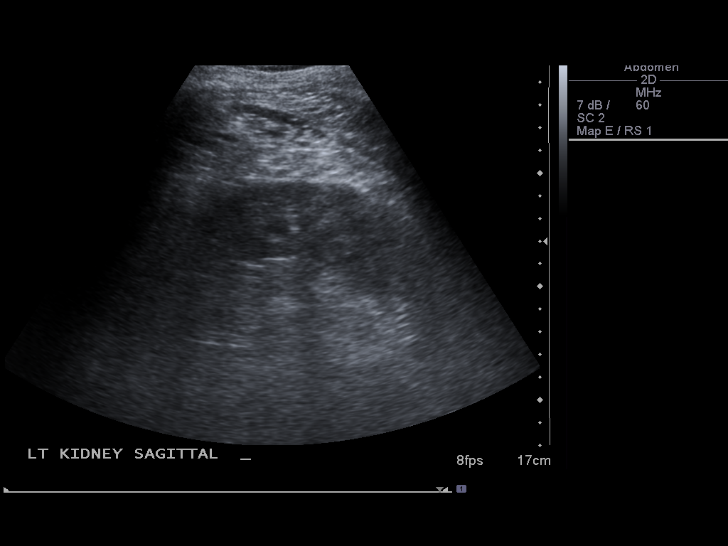
[im 60/80]
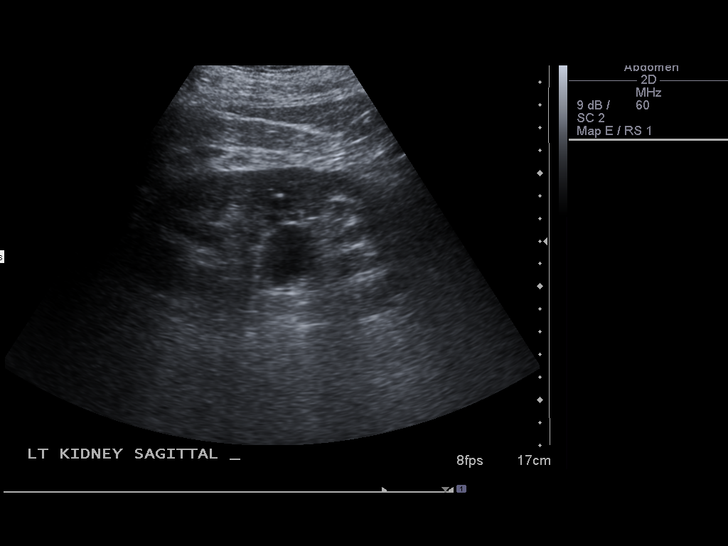
[im 66/80]
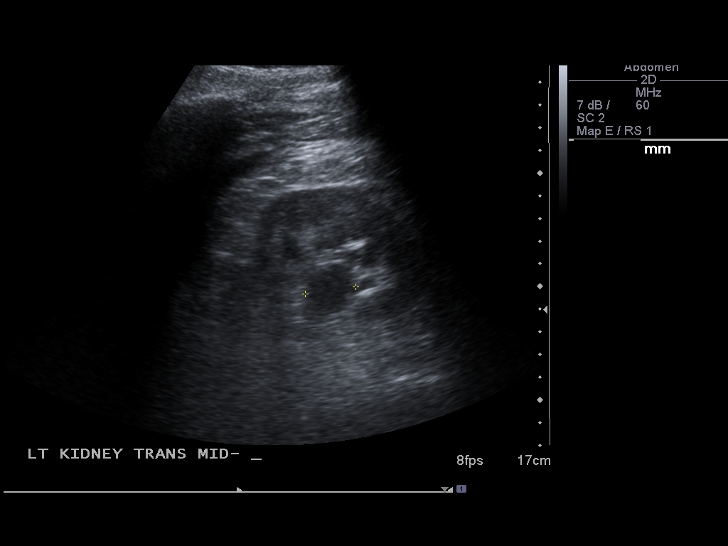
[im 73/80]
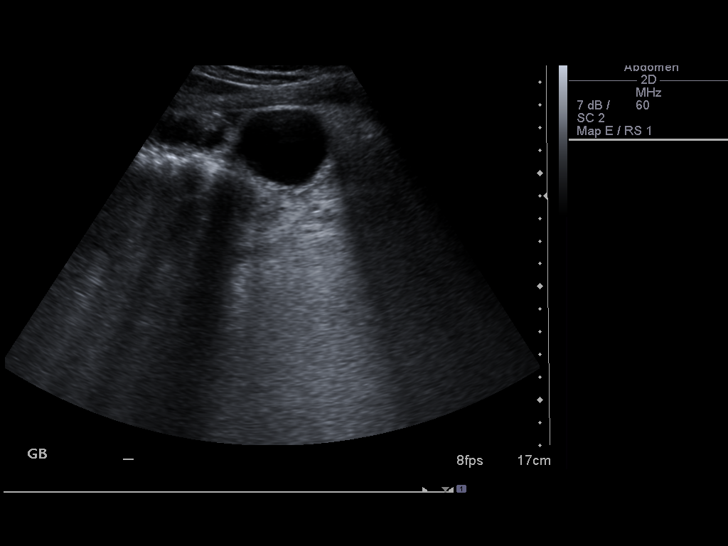
[im 80/80]
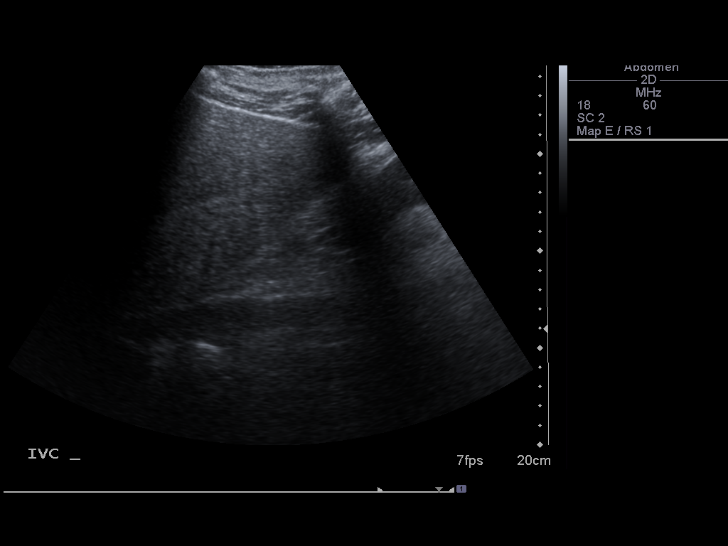

[14 of 25 positions shown; findings below may reference images not displayed]

FINDINGS: Gallbladder:  No gallstones or  gallbladder wall thickening.  There
is a small amount of pericholecystic fluid, likely from known
pancreatitis.

Common Bile Duct:  Within normal limits in caliber.  Measures 5 mm.

Liver: No focal mass lesion identified.  There is diffuse fatty
infiltration.

IVC:  Appears normal.

Pancreas:  No abnormality identified.  A small amount of peri
pancreatic fluid is present.  There is also a small left pleural
effusion.

Spleen:  Within normal limits in size and echotexture.

Right kidney:  Normal in size and parenchymal echogenicity.  No
evidence of mass or hydronephrosis.

Left kidney:  Normal in size and parenchymal echogenicity.  No
evidence of mass or hydronephrosis.  Parapelvic cysts.

Abdominal Aorta:  No aneurysm identified.
IMPRESSION: Normal gallbladder and common bile duct.

There is a small amount of ascites, predominately around the
pancreas, as well as a small left pleural effusion.

Fatty infiltration of the liver.

## 2012-08-02 IMAGING — CR DG CHEST 2V
2 series · 2 of 2 positions shown · non-contrast
Comparison: None

CLINICAL DATA: Acute pancreatitis.  Chest pain, shortness of
breath, cough.

CHEST - 2 VIEW

[w chest lat]
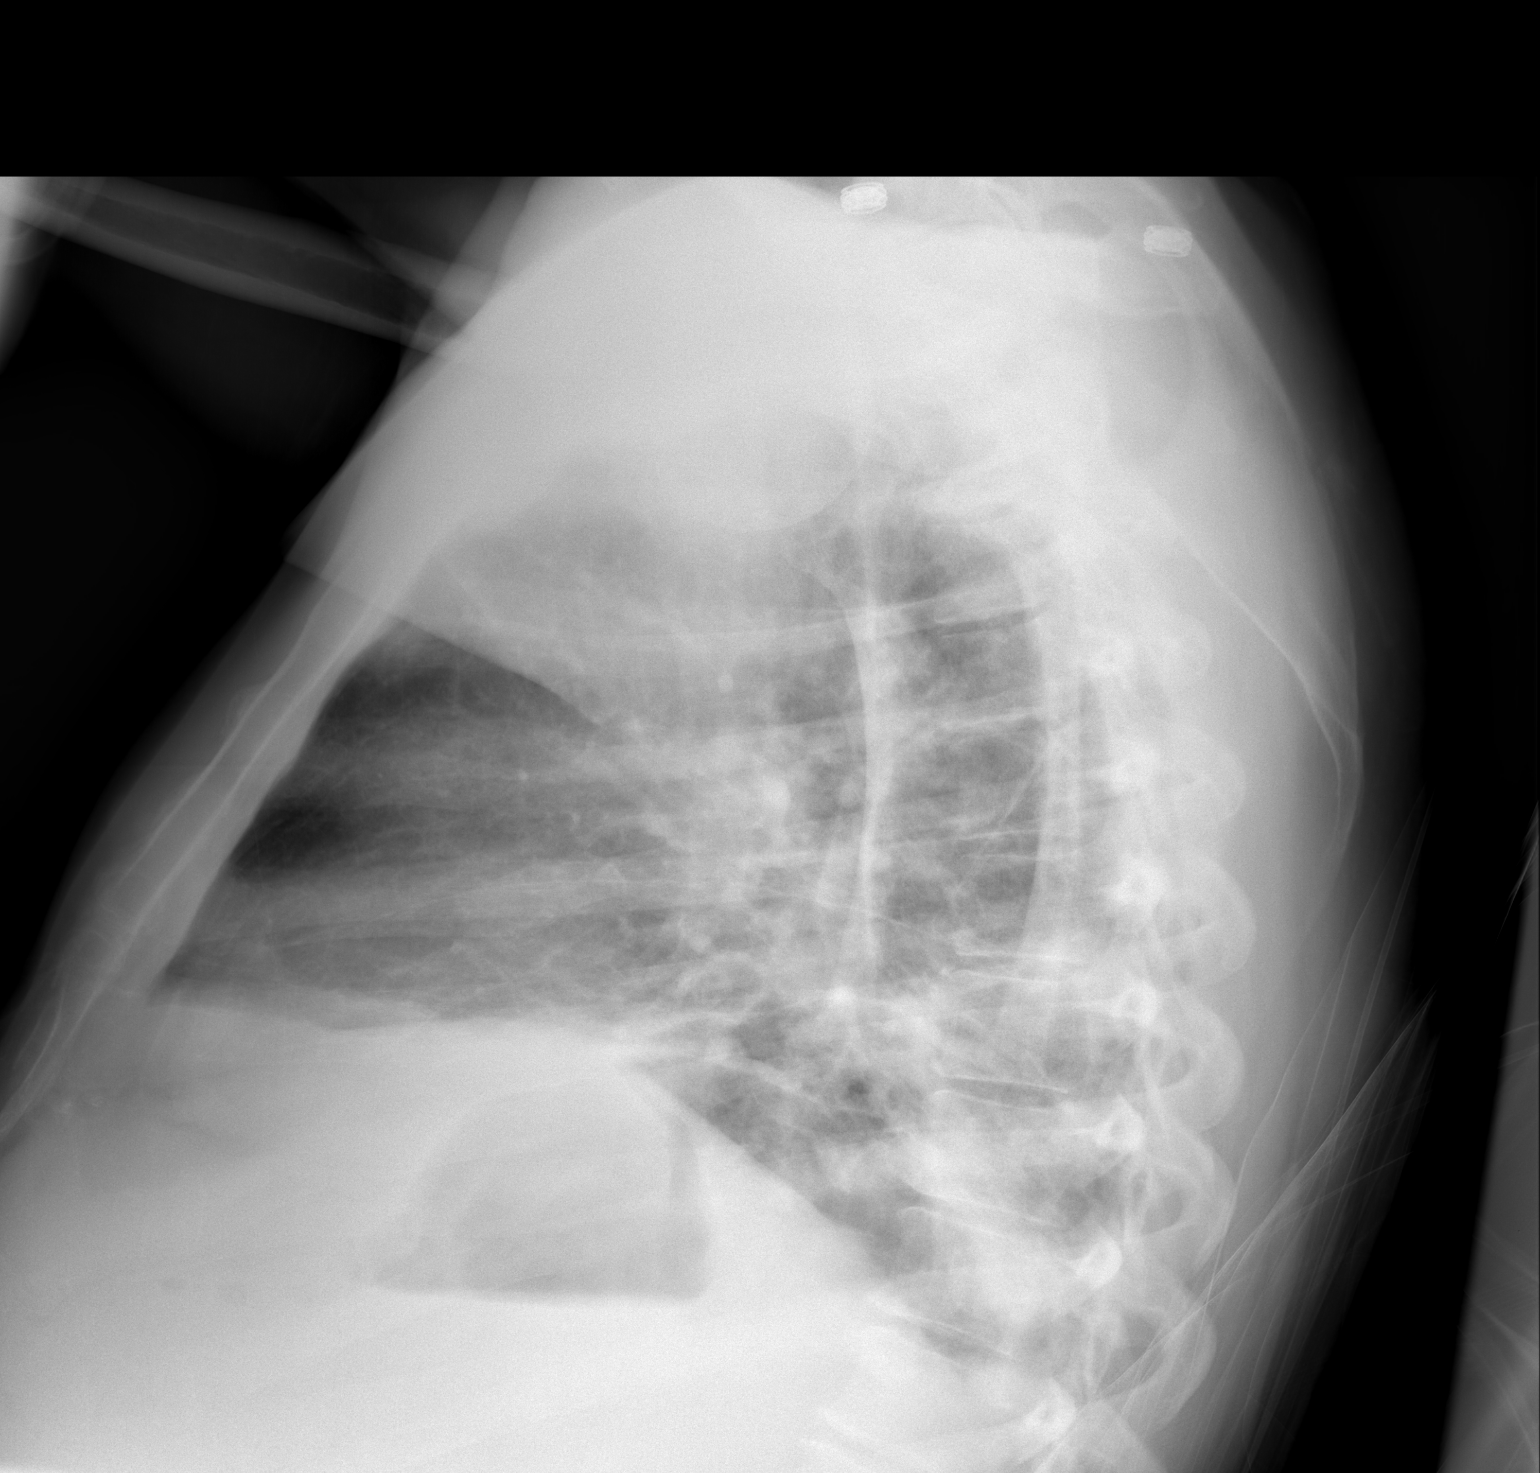

[view not recorded]
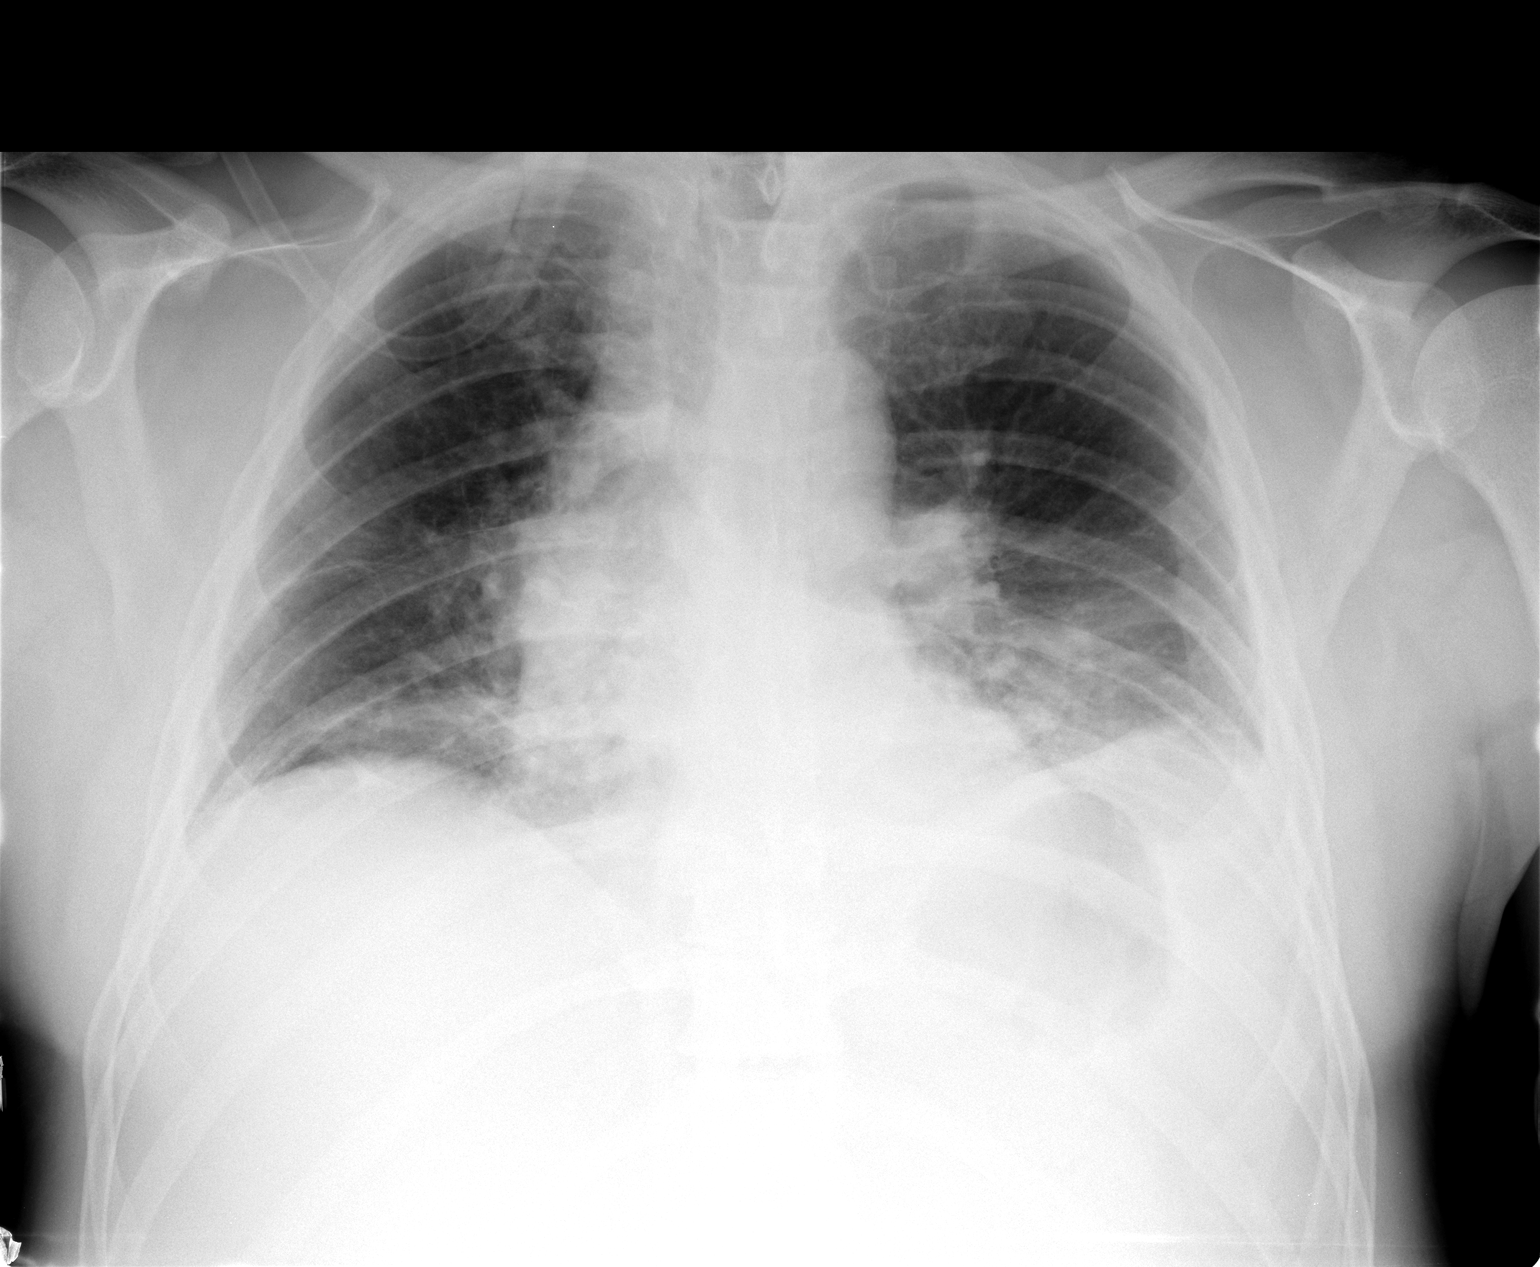

[2 of 2 positions shown; findings below may reference images not displayed]

FINDINGS: There are low lung volumes with bibasilar atelectasis or
infiltrates, left greater than right.  Small left pleural effusion
is noted.  Heart is upper limits normal in size.  No acute bony
abnormality.
IMPRESSION: Low lung volumes with bibasilar atelectasis or infiltrates, left
greater than right.  Small left effusion.

## 2013-01-05 ENCOUNTER — Emergency Department (HOSPITAL_BASED_OUTPATIENT_CLINIC_OR_DEPARTMENT_OTHER)
Admission: EM | Admit: 2013-01-05 | Discharge: 2013-01-05 | Disposition: A | Discharge status: Home / Self Care | Payer: Self-pay | Attending: Emergency Medicine | Admitting: Emergency Medicine

## 2013-01-05 DIAGNOSIS — R112 Nausea with vomiting, unspecified: Secondary | ICD-10-CM | POA: Insufficient documentation

## 2013-01-05 DIAGNOSIS — K859 Acute pancreatitis without necrosis or infection, unspecified: Secondary | ICD-10-CM | POA: Insufficient documentation

## 2013-01-05 DIAGNOSIS — F1020 Alcohol dependence, uncomplicated: Secondary | ICD-10-CM | POA: Insufficient documentation

## 2013-01-05 DIAGNOSIS — J4489 Other specified chronic obstructive pulmonary disease: Secondary | ICD-10-CM | POA: Insufficient documentation

## 2013-01-05 DIAGNOSIS — F17200 Nicotine dependence, unspecified, uncomplicated: Secondary | ICD-10-CM | POA: Insufficient documentation

## 2013-01-05 LAB — COMPREHENSIVE METABOLIC PANEL
Alkaline Phosphatase: 84 U/L (ref 39–117)
BUN: 7 mg/dL (ref 6–23)
Creatinine, Ser: 0.7 mg/dL (ref 0.50–1.35)
GFR calc Af Amer: >90 mL/min (ref >90)
Glucose, Bld: 200 mg/dL — ABNORMAL HIGH (ref 70–99)
Potassium: 4.1 mEq/L (ref 3.5–5.1)
Total Protein: 7.6 g/dL (ref 6.0–8.3)

## 2013-01-05 LAB — CBC WITH DIFFERENTIAL/PLATELET
Basophils Absolute: 0 10*3/uL (ref 0.0–0.1)
Basophils Relative: 0 % (ref 0–1)
Hemoglobin: 17.3 g/dL — ABNORMAL HIGH (ref 13.0–17.0)
Lymphocytes Relative: 5 % — ABNORMAL LOW (ref 12–46)
MCHC: 35.9 g/dL (ref 30.0–36.0)
Monocytes Relative: 7 % (ref 3–12)
Neutro Abs: 9.6 10*3/uL — ABNORMAL HIGH (ref 1.7–7.7)
Neutrophils Relative %: 87 % — ABNORMAL HIGH (ref 43–77)
RDW: 11.6 % (ref 11.5–15.5)
WBC: 10.9 10*3/uL — ABNORMAL HIGH (ref 4.0–10.5)

## 2013-01-05 LAB — LIPASE, BLOOD: Lipase: 604 U/L — ABNORMAL HIGH (ref 11–59)

## 2013-01-05 MED ORDER — HYDROMORPHONE HCL 4 MG PO TABS: 2.0000 mg | ORAL_TABLET | ORAL | Status: DC | PRN

## 2013-01-05 MED ORDER — FENTANYL CITRATE 0.05 MG/ML IJ SOLN
100.0000 ug | Freq: Once | INTRAMUSCULAR | Status: AC
  Administered 2013-01-05: 100 ug via INTRAVENOUS
  Filled 2013-01-05: qty 2

## 2013-01-05 MED ORDER — HYDROMORPHONE HCL PF 1 MG/ML IJ SOLN
1.0000 mg | Freq: Once | INTRAMUSCULAR | Status: AC
  Administered 2013-01-05: 1 mg via INTRAVENOUS
  Filled 2013-01-05: qty 1

## 2013-01-05 MED ORDER — ONDANSETRON HCL 4 MG/2ML IJ SOLN
4.0000 mg | Freq: Once | INTRAMUSCULAR | Status: AC
  Administered 2013-01-05: 4 mg via INTRAVENOUS
  Filled 2013-01-05: qty 2

## 2013-01-05 MED ORDER — ONDANSETRON 8 MG PO TBDP: 8.0000 mg | ORAL_TABLET | Freq: Three times a day (TID) | ORAL | Status: DC | PRN

## 2013-01-05 MED ORDER — SODIUM CHLORIDE 0.9 % IV BOLUS (SEPSIS)
1000.0000 mL | Freq: Once | INTRAVENOUS | Status: AC
  Administered 2013-01-05: 1000 mL via INTRAVENOUS

## 2013-01-05 NOTE — ED Notes (Signed)
Patient called stated his pain is causing him to have nausea, I gave patient emesis bag, notified Dr. Read Drivers.

## 2013-01-05 NOTE — ED Provider Notes (Signed)
History     CSN: [id number]  Arrival date & time 01/05/13  0310   First MD Initiated Contact with Patient 01/05/13 [phone]      Chief Complaint  Patient presents with  . Abdominal Pain    (Consider location/radiation/quality/duration/timing/severity/associated sxs/prior treatment) HPI This is a 52 year old male with a history of 2 prior episodes of pancreatitis. He states these were due to alcohol consumption. He is here with 2 days of epigastric pain characterized as like that of previous episodes of pancreatitis. It did follow an episode of alcohol consumption. The pain is described as severe and deep. It is worse with palpation or movement. He has been vomiting this morning but has not had diarrhea. He has COPD but denies acute change in his breathing. He does not have a regular physician and is not on any medications.  Past Medical History  Diagnosis Date  . COPD (chronic obstructive pulmonary disease)   . Pancreatitis     Past Surgical History  Procedure Laterality Date  . No past surgeries      No family history on file.  History  Substance Use Topics  . Smoking status: Current Every Day Smoker -- 1.00 packs/day for 35 years  . Smokeless tobacco: Not on file  . Alcohol Use: 0.0 oz/week    6-7 Cans of beer per week      Review of Systems  All other systems reviewed and are negative.    Allergies  Review of patient's allergies indicates no known allergies.  Home Medications  No current outpatient prescriptions on file.  BP 206/124  Pulse 91  Temp(Src) 98.1 F (36.7 C) (Oral)  Ht 6\' 1"  (1.854 m)  Wt 185 lb (83.915 kg)  BMI 24.41 kg/m2  SpO2 97%  Physical Exam General: Well-developed, well-nourished male in no acute distress; appearance consistent with age of record HENT: normocephalic, atraumatic Eyes: pupils equal round and reactive to light; extraocular muscles intact Neck: supple Heart: regular rate and rhythm Lungs: clear to auscultation  bilaterally Abdomen: soft; nondistended; epigastric tenderness; mild hepatosplenomegaly; bowel sounds present Extremities: No deformity; full range of motion; pulses normal; no edema Neurologic: Awake, alert and oriented; motor function intact in all extremities and symmetric; no facial droop Skin: Warm and dry Psychiatric: Flat affect    ED Course  Procedures (including critical care time)     MDM   Nursing notes and vitals signs, including pulse oximetry, reviewed.  Summary of this visit's results, reviewed by myself:  Labs:  Results for orders placed during the hospital encounter of 01/05/13 (from the past 24 hour(s))  COMPREHENSIVE METABOLIC PANEL     Status: Abnormal   Collection Time    01/05/13  3:04 AM      Result Value Range   Sodium 128 (*) 135 - 145 mEq/L   Potassium 4.1  3.5 - 5.1 mEq/L   Chloride 93 (*) 96 - 112 mEq/L   CO2 21  19 - 32 mEq/L   Glucose, Bld 200 (*) 70 - 99 mg/dL   BUN 7  6 - 23 mg/dL   Creatinine, Ser 1.19  0.50 - 1.35 mg/dL   Calcium 9.2  8.4 - 14.7 mg/dL   Total Protein 7.6  6.0 - 8.3 g/dL   Albumin 3.9  3.5 - 5.2 g/dL   AST 22  0 - 37 U/L   ALT 37  0 - 53 U/L   Alkaline Phosphatase 84  39 - 117 U/L   Total Bilirubin 1.5 (*)  0.3 - 1.2 mg/dL   GFR calc non Af Amer >90  >90 mL/min   GFR calc Af Amer >90  >90 mL/min  LIPASE, BLOOD     Status: Abnormal   Collection Time    01/05/13  3:04 AM      Result Value Range   Lipase 604 (*) 11 - 59 U/L  CBC WITH DIFFERENTIAL     Status: Abnormal   Collection Time    01/05/13  3:04 AM      Result Value Range   WBC 10.9 (*) 4.0 - 10.5 K/uL   RBC 5.05  4.22 - 5.81 MIL/uL   Hemoglobin 17.3 (*) 13.0 - 17.0 g/dL   HCT 45.4  09.8 - 11.9 %   MCV 95.4  78.0 - 100.0 fL   MCH 34.3 (*) 26.0 - 34.0 pg   MCHC 35.9  30.0 - 36.0 g/dL   RDW 14.7  82.9 - 56.2 %   Platelets 133 (*) 150 - 400 K/uL   Neutrophils Relative 87 (*) 43 - 77 %   Neutro Abs 9.6 (*) 1.7 - 7.7 K/uL   Lymphocytes Relative 5 (*) 12 -  46 %   Lymphs Abs 0.5 (*) 0.7 - 4.0 K/uL   Monocytes Relative 7  3 - 12 %   Monocytes Absolute 0.8  0.1 - 1.0 K/uL   Eosinophils Relative 1  0 - 5 %   Eosinophils Absolute 0.1  0.0 - 0.7 K/uL   Basophils Relative 0  0 - 1 %   Basophils Absolute 0.0  0.0 - 0.1 K/uL  ETHANOL     Status: None   Collection Time    01/05/13  3:04 AM      Result Value Range   Alcohol, Ethyl (B) <11  0 - 11 mg/dL   1:30 AM Pain, nausea improved with IV medications. Patient was more responsive to Dilaudid than fentanyl. Although he is required admission for past episodes of pancreatitis he believes he can be treated as an outpatient this time. He understands the need to avoid alcohol and not to take alcohol while taking narcotics. He was advised of his elevated blood pressure and the importance of seeking a primary care physician for long-term treatment of his medical problems.        Hanley Seamen, MD 01/05/13 [phone]

## 2013-01-05 NOTE — ED Notes (Signed)
Arrived by EMS with complaint of upper abdominal pain x 3 days with nausea and vomiting. Patient reports that this pain is like his pancreatitis pain. Reports that he drinks daily and had 2 beers yesterday at lunch, no vomiting x the past 12 hours

## 2013-01-05 NOTE — ED Notes (Signed)
Patient has ride coming. Additional pain medication administered and fluid infusing. Preparing for discharge

## 2013-01-05 NOTE — ED Notes (Signed)
Attempting to find transportation home.

## 2013-11-04 ENCOUNTER — Emergency Department (HOSPITAL_BASED_OUTPATIENT_CLINIC_OR_DEPARTMENT_OTHER): Payer: Self-pay

## 2013-11-04 ENCOUNTER — Encounter (HOSPITAL_BASED_OUTPATIENT_CLINIC_OR_DEPARTMENT_OTHER): Payer: Self-pay | Admitting: Emergency Medicine

## 2013-11-04 ENCOUNTER — Emergency Department (HOSPITAL_BASED_OUTPATIENT_CLINIC_OR_DEPARTMENT_OTHER)
Admission: EM | Admit: 2013-11-04 | Discharge: 2013-11-04 | Disposition: A | Discharge status: Home / Self Care | Payer: Self-pay | Attending: Emergency Medicine | Admitting: Emergency Medicine

## 2013-11-04 DIAGNOSIS — J449 Chronic obstructive pulmonary disease, unspecified: Secondary | ICD-10-CM | POA: Insufficient documentation

## 2013-11-04 DIAGNOSIS — Z792 Long term (current) use of antibiotics: Secondary | ICD-10-CM | POA: Insufficient documentation

## 2013-11-04 DIAGNOSIS — I10 Essential (primary) hypertension: Secondary | ICD-10-CM | POA: Diagnosis present

## 2013-11-04 DIAGNOSIS — H538 Other visual disturbances: Secondary | ICD-10-CM | POA: Diagnosis present

## 2013-11-04 DIAGNOSIS — F17200 Nicotine dependence, unspecified, uncomplicated: Secondary | ICD-10-CM | POA: Insufficient documentation

## 2013-11-04 DIAGNOSIS — Z8719 Personal history of other diseases of the digestive system: Secondary | ICD-10-CM | POA: Insufficient documentation

## 2013-11-04 DIAGNOSIS — J4489 Other specified chronic obstructive pulmonary disease: Secondary | ICD-10-CM | POA: Insufficient documentation

## 2013-11-04 LAB — CBC WITH DIFFERENTIAL/PLATELET
Basophils Absolute: 0 10*3/uL (ref 0.0–0.1)
Basophils Relative: 1 % (ref 0–1)
Eosinophils Relative: 1 % (ref 0–5)
Lymphocytes Relative: 11 % — ABNORMAL LOW (ref 12–46)
Lymphs Abs: 0.8 10*3/uL (ref 0.7–4.0)
MCV: 97.5 fL (ref 78.0–100.0)
Monocytes Relative: 8 % (ref 3–12)
Neutrophils Relative %: 80 % — ABNORMAL HIGH (ref 43–77)
Platelets: 133 10*3/uL — ABNORMAL LOW (ref 150–400)
RBC: 4.78 MIL/uL (ref 4.22–5.81)
RDW: 13.3 % (ref 11.5–15.5)
WBC: 7.6 10*3/uL (ref 4.0–10.5)

## 2013-11-04 LAB — BASIC METABOLIC PANEL
CO2: 27 mEq/L (ref 19–32)
Calcium: 9.5 mg/dL (ref 8.4–10.5)
GFR calc non Af Amer: >90 mL/min (ref >90)
Glucose, Bld: 228 mg/dL — ABNORMAL HIGH (ref 70–99)
Potassium: 3.8 mEq/L (ref 3.5–5.1)
Sodium: 138 mEq/L (ref 135–145)

## 2013-11-04 LAB — URINALYSIS W MICROSCOPIC + REFLEX CULTURE
Ketones, ur: 15 mg/dL — AB
Leukocytes, UA: NEGATIVE
Nitrite: NEGATIVE
Protein, ur: 30 mg/dL — AB
Urobilinogen, UA: 1 mg/dL (ref 0.0–1.0)
pH: 7 (ref 5.0–8.0)

## 2013-11-04 LAB — TROPONIN I: Troponin I: <0.3 ng/mL (ref <0.30)

## 2013-11-04 MED ORDER — LORAZEPAM 2 MG/ML IJ SOLN
0.5000 mg | Freq: Once | INTRAMUSCULAR | Status: AC
  Administered 2013-11-04: 0.5 mg via INTRAVENOUS
  Filled 2013-11-04: qty 1

## 2013-11-04 MED ORDER — SODIUM CHLORIDE 0.9 % IV BOLUS (SEPSIS)
1000.0000 mL | INTRAVENOUS | Status: AC
  Administered 2013-11-04: 1000 mL via INTRAVENOUS

## 2013-11-04 MED ORDER — HYDRALAZINE HCL 20 MG/ML IJ SOLN
5.0000 mg | Freq: Once | INTRAMUSCULAR | Status: AC
  Administered 2013-11-04: 5 mg via INTRAVENOUS
  Filled 2013-11-04: qty 1

## 2013-11-04 MED ORDER — HYDROCHLOROTHIAZIDE 25 MG PO TABS: 25.0000 mg | ORAL_TABLET | Freq: Every day | ORAL | Status: DC

## 2013-11-04 NOTE — ED Provider Notes (Signed)
CSN: [id number]     Arrival date & time 11/04/13  1525 History   First MD Initiated Contact with Patient 11/04/13 1657     Chief Complaint  Patient presents with  . Hypertension   (Consider location/radiation/quality/duration/timing/severity/associated sxs/prior Treatment) Patient is a 52 y.o. male presenting with hypertension. The history is provided by the patient.  Hypertension This is a new problem. The current episode started more than 1 week ago. The problem occurs constantly. The problem has not changed since onset.Pertinent negatives include no chest pain, no abdominal pain, no headaches and no shortness of breath. Nothing aggravates the symptoms. Nothing relieves the symptoms. He has tried nothing for the symptoms. The treatment provided no relief.    Past Medical History  Diagnosis Date  . COPD (chronic obstructive pulmonary disease)   . Pancreatitis    Past Surgical History  Procedure Laterality Date  . No past surgeries     No family history on file. History  Substance Use Topics  . Smoking status: Current Every Day Smoker -- 1.00 packs/day for 35 years  . Smokeless tobacco: Not on file  . Alcohol Use: 0.0 oz/week    Review of Systems  Constitutional: Negative for fever.  HENT: Negative for drooling and rhinorrhea.   Eyes: Negative for pain.  Respiratory: Negative for cough and shortness of breath.   Cardiovascular: Negative for chest pain and leg swelling.  Gastrointestinal: Negative for nausea, vomiting, abdominal pain and diarrhea.  Genitourinary: Negative for dysuria and hematuria.  Musculoskeletal: Negative for gait problem and neck pain.  Skin: Negative for color change.  Neurological: Negative for numbness and headaches.  Hematological: Negative for adenopathy.  Psychiatric/Behavioral: Negative for behavioral problems.  All other systems reviewed and are negative.    Allergies  Review of patient's allergies indicates no known allergies.  Home  Medications   Current Outpatient Rx  Name  Route  Sig  Dispense  Refill  . AMOXICILLIN PO   Oral   Take by mouth.         Marland Kitchen HYDROmorphone (DILAUDID) 4 MG tablet   Oral   Take 0.5-1 tablets (2-4 mg total) by mouth every 4 (four) hours as needed for pain.   30 tablet   0   . ondansetron (ZOFRAN ODT) 8 MG disintegrating tablet   Oral   Take 1 tablet (8 mg total) by mouth every 8 (eight) hours as needed for nausea.   20 tablet   0    BP 198/112  Pulse 108  Temp(Src) 98.7 F (37.1 C) (Oral)  Resp 18  Ht 6\' 1"  (1.854 m)  Wt 185 lb (83.915 kg)  BMI 24.41 kg/m2  SpO2 97% Physical Exam  Nursing note and vitals reviewed. Constitutional: He is oriented to person, place, and time. He appears well-developed and well-nourished.  HENT:  Head: Normocephalic and atraumatic.  Right Ear: External ear normal.  Left Ear: External ear normal.  Nose: Nose normal.  Mouth/Throat: Oropharynx is clear and moist. No oropharyngeal exudate.  Eyes: Conjunctivae and EOM are normal. Pupils are equal, round, and reactive to light.  Neck: Normal range of motion. Neck supple.  Cardiovascular: Normal rate, regular rhythm, normal heart sounds and intact distal pulses.  Exam reveals no gallop and no friction rub.   No murmur heard. Pulmonary/Chest: Effort normal and breath sounds normal. No respiratory distress. He has no wheezes.  Abdominal: Soft. Bowel sounds are normal. He exhibits no distension. There is no tenderness. There is no rebound and no guarding.  Musculoskeletal: Normal range of motion. He exhibits no edema and no tenderness.  Neurological: He is alert and oriented to person, place, and time. He has normal strength. No cranial nerve deficit or sensory deficit. He displays a negative Romberg sign. Coordination and gait normal.  Normal speech and understanding.  Normal finger to nose bilaterally.  Vision in the left eye is 20/25. Vision in the right eye is 20/20.   Mild tremor in  bilateral upper extremities.  Skin: Skin is warm and dry.  Psychiatric: He has a normal mood and affect. His behavior is normal.    ED Course  Procedures (including critical care time) Labs Review Labs Reviewed  CBC WITH DIFFERENTIAL - Abnormal; Notable for the following:    Platelets 133 (*)    Neutrophils Relative % 80 (*)    Lymphocytes Relative 11 (*)    All other components within normal limits  BASIC METABOLIC PANEL - Abnormal; Notable for the following:    Chloride 95 (*)    Glucose, Bld 228 (*)    All other components within normal limits  URINALYSIS W MICROSCOPIC + REFLEX CULTURE - Abnormal; Notable for the following:    Color, Urine AMBER (*)    Glucose, UA 500 (*)    Bilirubin Urine SMALL (*)    Ketones, ur 15 (*)    Protein, ur 30 (*)    All other components within normal limits  TROPONIN I   Imaging Review Dg Chest 2 View  11/04/2013   CLINICAL DATA:  Cough, COPD  EXAM: CHEST  2 VIEW  COMPARISON:  CTA chest dated 09/15/2012  FINDINGS: Mild patchy right lower lobe opacity is possible. No pleural effusion or pneumothorax.  The heart is normal in size.  Degenerative changes of the visualized thoracolumbar spine.  IMPRESSION: Mild patchy right lower lobe opacity is possible, although equivocal. Pneumonia is not excluded.   Electronically Signed   By: Charline Bills M.D.   On: 11/04/2013 18:35   Ct Head Wo Contrast  11/04/2013   CLINICAL DATA:  Blurry vision and dizziness  EXAM: CT HEAD WITHOUT CONTRAST  TECHNIQUE: Contiguous axial images were obtained from the base of the skull through the vertex without intravenous contrast.  COMPARISON:  None.  FINDINGS: The cerebral and cerebellar hemispheres have a normal attenuation and morphology. No midline shift, ventriculomegaly, mass effect, evidence of mass lesion, intracranial hemorrhage or evidence of cortically based acute infarction. Gray-white matter differentiation is within normal limits throughout the brain. The  paranasal sinuses are clear. The mastoid air cells are clear. The skull is intact.  IMPRESSION: 1. No acute intracranial abnormalities. 2. Normal brain.   Electronically Signed   By: Signa Kell M.D.   On: 11/04/2013 18:24    EKG Interpretation   None       Date: 11/04/2013  Rate: 104  Rhythm: sinus tachycardia  QRS Axis: normal  Intervals: normal  ST/T Wave abnormalities: nonspecific T wave changes  Conduction Disutrbances:none  Narrative Interpretation: non-spec isolated t wave inversion in V2  Old EKG Reviewed: changes noted   MDM   1. Hypertension   2. Blurry vision, bilateral    5:14 PM 52 y.o. male who presents for evaluation of his blood pressure. The patient was to have a root canal today and states that the dentist would not perform the procedure due to his elevated blood pressure. He does not know what it was at that time. His blood pressure here today is 198/112. He states that  he has had dental pain but has otherwise been well. He also notes some mild blurry vision and some mild dizziness which began yesterday and has been intermittent since then. He is mildly tachycardic here and hypertensive. He denies any chest pain, shortness of breath, or headache. The patient also has a history of tobacco abuse and drinks up to a sixpack of beer daily. He is mildly tremulous on exam and his last drink was yesterday. Will give him a half milligram of Ativan and a small amount of hydralazine for his blood pressure. Will get screening imaging and lab work to rule out any end organ damage.  6:54 PM: No evidence of end organ damage. BP decreasing appropriately. Will start hctz.  I have discussed the diagnosis/risks/treatment options with the patient and believe the pt to be eligible for discharge home to follow-up and establish with a pcp in the next 1-2 weeks. He understands. We also discussed returning to the ED immediately if new or worsening sx occur. We discussed the sx which are most  concerning (e.g., dizziness, HA, blurry vision, sob, cp) that necessitate immediate return. Any new prescriptions provided to the patient are listed below.  Discharge Medication List as of 11/04/2013  7:01 PM    START taking these medications   Details  hydrochlorothiazide (HYDRODIURIL) 25 MG tablet Take 1 tablet (25 mg total) by mouth daily., Starting 11/04/2013, Until Discontinued, Print         Junius Argyle, MD 11/05/13 1059

## 2013-11-04 NOTE — ED Notes (Signed)
Pt states he was to have dental work today-dentist would not do root canal due to elevated BP-pt denies hx HTN but does not see PCP

## 2013-11-04 NOTE — ED Notes (Signed)
MD at bedside discussing test results and plan of care. 

## 2013-11-04 NOTE — ED Notes (Signed)
MD at bedside. 

## 2017-03-29 ENCOUNTER — Ambulatory Visit: Payer: Self-pay | Attending: Family Medicine | Admitting: Family Medicine

## 2017-03-29 VITALS — BP 118/76 | HR 67 | Temp 98.0°F | Resp 18 | Ht 73.0 in | Wt 164.4 lb

## 2017-03-29 DIAGNOSIS — I10 Essential (primary) hypertension: Secondary | ICD-10-CM | POA: Insufficient documentation

## 2017-03-29 DIAGNOSIS — Z8679 Personal history of other diseases of the circulatory system: Secondary | ICD-10-CM

## 2017-03-29 DIAGNOSIS — F17210 Nicotine dependence, cigarettes, uncomplicated: Secondary | ICD-10-CM | POA: Insufficient documentation

## 2017-03-29 DIAGNOSIS — Z23 Encounter for immunization: Secondary | ICD-10-CM

## 2017-03-29 DIAGNOSIS — M24811 Other specific joint derangements of right shoulder, not elsewhere classified: Secondary | ICD-10-CM | POA: Insufficient documentation

## 2017-03-29 DIAGNOSIS — Z79899 Other long term (current) drug therapy: Secondary | ICD-10-CM | POA: Insufficient documentation

## 2017-03-29 DIAGNOSIS — Z0000 Encounter for general adult medical examination without abnormal findings: Secondary | ICD-10-CM

## 2017-03-29 DIAGNOSIS — M720 Palmar fascial fibromatosis [Dupuytren]: Secondary | ICD-10-CM | POA: Insufficient documentation

## 2017-03-29 DIAGNOSIS — M25511 Pain in right shoulder: Secondary | ICD-10-CM

## 2017-03-29 DIAGNOSIS — G8929 Other chronic pain: Secondary | ICD-10-CM | POA: Insufficient documentation

## 2017-03-29 LAB — POCT UA - MICROALBUMIN
Albumin/Creatinine Ratio, Urine, POC: <30
CREATININE, POC: 50 mg/dL
Microalbumin Ur, POC: 10 mg/L

## 2017-03-29 MED ORDER — NAPROXEN 500 MG PO TABS: 500.0000 mg | ORAL_TABLET | Freq: Two times a day (BID) | ORAL | 1 refills | Status: DC

## 2017-03-29 NOTE — Progress Notes (Signed)
Subjective:  Patient ID: [name], male    DOB: [birth year]  Age: 56 y.o. MRN: [id number]  CC: Establish Care   HPI [name] presents for   Joint/Muscle Pain: Patient complains of arthralgias for which has been present for 7 months. Pain is located in the right shoulder(s), is described as aching and stiffines that radiates down the arm to the hand., and is constant . He denies any paresthesias. Pain 7/10.  Associated symptoms include: crepitation.  The patient has tried NSAID's, tylenol, and hot showers for pain, with minimal relief of symoptoms.  Related to injury: No. He does report history of chopping wood late last year when prior to symptoms beginning.   Hypertension: Patient here for follow-up of elevated blood pressure. He is not exercising and is adherent to low salt diet.  Blood pressure He does not check BP at home. He reports not taking any anti-hypertensive for BP. Cardiac symptoms none. Patient denies chest pain, chest pressure/discomfort, claudication, dyspnea, lower extremity edema, near-syncope, palpitations and syncope.  Cardiovascular risk factors: advanced age (older than 4 for men, 46 for women), hypertension, male gender, sedentary lifestyle and smoking/ tobacco exposure. Current smoker reports smoking 1 pack per day . He is not ready to quit at this time. Use of agents associated with hypertension: NSAIDS. History of target organ damage: none.     Outpatient Medications Prior to Visit  Medication Sig Dispense Refill  . AMOXICILLIN PO Take by mouth.    . hydrochlorothiazide (HYDRODIURIL) 25 MG tablet Take 1 tablet (25 mg total) by mouth daily. (Patient not taking: Reported on 03/29/2017) 30 tablet 1  . HYDROmorphone (DILAUDID) 4 MG tablet Take 0.5-1 tablets (2-4 mg total) by mouth every 4 (four) hours as needed for pain. (Patient not taking: Reported on 03/29/2017) 30 tablet 0  . ondansetron (ZOFRAN ODT) 8 MG disintegrating tablet Take 1 tablet (8 mg  total) by mouth every 8 (eight) hours as needed for nausea. (Patient not taking: Reported on 03/29/2017) 20 tablet 0   No facility-administered medications prior to visit.     ROS Review of Systems  Constitutional: Negative.   Eyes: Negative.   Respiratory: Negative.   Cardiovascular: Negative.   Musculoskeletal: Positive for arthralgias.  Skin: Negative.   Psychiatric/Behavioral: Negative.     Objective:  BP 118/76 (BP Location: Left Arm, Patient Position: Sitting, Cuff Size: Normal)   Pulse 67   Temp 98 F (36.7 C) (Oral)   Resp 18   Ht 6' 1"  (1.854 m)   Wt 164 lb 6.4 oz (74.6 kg)   PF 96 L/min   BMI 21.69 kg/m   BP/Weight 03/29/2017 11/04/2013 2/87/8676  Systolic BP 720 947 096  Diastolic BP 76 283 662  Wt. (Lbs) 164.4 185 185  BMI 21.69 24.41 24.41     Physical Exam  Constitutional: He appears well-developed and well-nourished.  Eyes: Conjunctivae are normal. Pupils are equal, round, and reactive to light.  Neck: No JVD present.  Cardiovascular: Normal rate, regular rhythm, normal heart sounds and intact distal pulses.   Pulmonary/Chest: Effort normal and breath sounds normal.  Abdominal: Soft. Bowel sounds are normal.  Musculoskeletal:       Right shoulder: He exhibits decreased range of motion and pain.       Hands: Skin: Skin is warm and dry.  Nursing note and vitals reviewed.    Assessment & Plan:   Problem List Items Addressed This Visit    None    Visit  Diagnoses    Chronic right shoulder pain    -  Primary   Relevant Medications   naproxen (NAPROSYN) 500 MG tablet   Other Relevant Orders   DG Shoulder Right   Ambulatory referral to Orthopedics   Crepitus of right shoulder joint       Relevant Medications   naproxen (NAPROSYN) 500 MG tablet   Other Relevant Orders   DG Shoulder Right   Dupuytren contracture       Relevant Orders   Ambulatory referral to Orthopedics   History of hypertension       Relevant Orders   Lipid Panel  (Completed)   CMP14+EGFR (Completed)   POCT UA - Microalbumin (Completed)   Healthcare maintenance       Relevant Orders   Tdap vaccine greater than or equal to 7yo IM (Completed)   Ambulatory referral to Gastroenterology      Meds ordered this encounter  Medications  . naproxen (NAPROSYN) 500 MG tablet    Sig: Take 1 tablet (500 mg total) by mouth 2 (two) times daily with a meal.    Dispense:  30 tablet    Refill:  1    Order Specific Question:   Supervising Provider    Answer:   Tresa Garter [[id number]]      Alfonse Spruce FNP

## 2017-03-29 NOTE — Progress Notes (Signed)
Patient is here for shoulder pain   Patient is not taking any medication at all  Patient has eaten for today

## 2017-03-29 NOTE — Patient Instructions (Signed)
Apply for orange card to complete referral process. You will be called with your labs results.  Shoulder Pain Many things can cause shoulder pain, including:  An injury.  Moving the arm in the same way again and again (overuse).  Joint pain (arthritis). Follow these instructions at home: Take these actions to help with your pain:  Squeeze a soft ball or a foam pad as much as you can. This helps to prevent swelling. It also makes the arm stronger.  Take over-the-counter and prescription medicines only as told by your doctor.  If told, put ice on the area:  Put ice in a plastic bag.  Place a towel between your skin and the bag.  Leave the ice on for 20 minutes, 2-3 times per day. Stop putting on ice if it does not help with the pain.  If you were given a shoulder sling or immobilizer:  Wear it as told.  Remove it to shower or bathe.  Move your arm as little as possible.  Keep your hand moving. This helps prevent swelling. Contact a doctor if:  Your pain gets worse.  Medicine does not help your pain.  You have new pain in your arm, hand, or fingers. Get help right away if:  Your arm, hand, or fingers:  Tingle.  Are numb.  Are swollen.  Are painful.  Turn white or blue. This information is not intended to replace advice given to you by your health care provider. Make sure you discuss any questions you have with your health care provider. Document Released: 04/17/2008 Document Revised: 06/25/2016 Document Reviewed: 02/22/2015 Elsevier Interactive Patient Education  2017 [employer]

## 2017-03-30 LAB — LIPID PANEL
CHOLESTEROL TOTAL: 167 mg/dL (ref 100–199)
Chol/HDL Ratio: 4.6 ratio (ref 0.0–5.0)
HDL: 36 mg/dL — ABNORMAL LOW (ref >39)
LDL Calculated: 101 mg/dL — ABNORMAL HIGH (ref 0–99)
Triglycerides: 150 mg/dL — ABNORMAL HIGH (ref 0–149)
VLDL Cholesterol Cal: 30 mg/dL (ref 5–40)

## 2017-03-30 LAB — CMP14+EGFR
ALBUMIN: 4.4 g/dL (ref 3.5–5.5)
ALT: 32 IU/L (ref 0–44)
AST: 20 IU/L (ref 0–40)
Albumin/Globulin Ratio: 1.7 (ref 1.2–2.2)
Alkaline Phosphatase: 82 IU/L (ref 39–117)
BUN / CREAT RATIO: 11 (ref 9–20)
BUN: 8 mg/dL (ref 6–24)
Bilirubin Total: 0.9 mg/dL (ref 0.0–1.2)
CALCIUM: 9 mg/dL (ref 8.7–10.2)
CO2: 22 mmol/L (ref 18–29)
CREATININE: 0.74 mg/dL — AB (ref 0.76–1.27)
Chloride: 98 mmol/L (ref 96–106)
GFR, EST AFRICAN AMERICAN: 118 mL/min/{1.73_m2} (ref >59)
GFR, EST NON AFRICAN AMERICAN: 102 mL/min/{1.73_m2} (ref >59)
Globulin, Total: 2.6 g/dL (ref 1.5–4.5)
Glucose: 176 mg/dL — ABNORMAL HIGH (ref 65–99)
Potassium: 4.5 mmol/L (ref 3.5–5.2)
Sodium: 136 mmol/L (ref 134–144)
TOTAL PROTEIN: 7 g/dL (ref 6.0–8.5)

## 2017-04-03 ENCOUNTER — Ambulatory Visit: Payer: Self-pay | Attending: Family Medicine

## 2017-04-06 ENCOUNTER — Other Ambulatory Visit: Payer: Self-pay | Admitting: Family Medicine

## 2017-04-06 DIAGNOSIS — E782 Mixed hyperlipidemia: Secondary | ICD-10-CM

## 2017-04-06 MED ORDER — ASPIRIN EC 81 MG PO TBEC: 81.0000 mg | DELAYED_RELEASE_TABLET | Freq: Every day | ORAL | 3 refills | Status: DC

## 2017-04-06 MED ORDER — PRAVASTATIN SODIUM 20 MG PO TABS: 20.0000 mg | ORAL_TABLET | Freq: Every day | ORAL | 0 refills | Status: DC

## 2017-04-06 MED ORDER — ASPIRIN EC 81 MG PO TBEC: 81.0000 mg | DELAYED_RELEASE_TABLET | Freq: Every day | ORAL | 0 refills | Status: DC

## 2017-04-11 ENCOUNTER — Telehealth: Payer: Self-pay

## 2017-04-11 NOTE — Telephone Encounter (Signed)
CMA call regarding lab results   Patient Verify DOB   Patient was aware and understood  

## 2017-04-11 NOTE — Telephone Encounter (Signed)
-----   Message from Lizbeth BarkMandesia R Hairston, FNP sent at 04/06/2017  9:40 AM EDT ----- Kidney function normal Liver function normal Lipid levels were elevated. This can increase your risk of heart disease. You will be prescribed pravastatin and aspirin to help lower risk. Recommend follow up in 3 months.

## 2017-05-02 ENCOUNTER — Ambulatory Visit (INDEPENDENT_AMBULATORY_CARE_PROVIDER_SITE_OTHER): Payer: Self-pay | Admitting: Orthopedic Surgery

## 2017-05-03 ENCOUNTER — Ambulatory Visit (INDEPENDENT_AMBULATORY_CARE_PROVIDER_SITE_OTHER): Payer: Self-pay

## 2017-05-03 ENCOUNTER — Ambulatory Visit (INDEPENDENT_AMBULATORY_CARE_PROVIDER_SITE_OTHER): Payer: Self-pay | Admitting: Orthopedic Surgery

## 2017-05-03 ENCOUNTER — Encounter (INDEPENDENT_AMBULATORY_CARE_PROVIDER_SITE_OTHER): Payer: Self-pay | Admitting: Orthopedic Surgery

## 2017-05-03 DIAGNOSIS — M24542 Contracture, left hand: Secondary | ICD-10-CM

## 2017-05-03 DIAGNOSIS — M25511 Pain in right shoulder: Secondary | ICD-10-CM

## 2017-05-03 DIAGNOSIS — G8929 Other chronic pain: Secondary | ICD-10-CM

## 2017-05-06 DIAGNOSIS — M25511 Pain in right shoulder: Secondary | ICD-10-CM

## 2017-05-06 DIAGNOSIS — G8929 Other chronic pain: Secondary | ICD-10-CM

## 2017-05-06 NOTE — Progress Notes (Signed)
Office Visit Note   Patient: [name]           Date of Birth: 1960-12-27           MRN: [id number] Visit Date: 05/03/2017 Requested by: Lizbeth BarkHairston, Mandesia R, FNP [address] [place], [place] [address] PCP: Lizbeth BarkHairston, Mandesia R, FNP  Subjective: Chief Complaint  Patient presents with  . Right Shoulder - Pain  . Left Hand - Pain    HPI: Nathan MaduroRobert is a 56 year old patient with right shoulder pain and left hand flexion contracture of his fifth finger.  The flexion contractures been there for a long time.  He describes right shoulder pain of several months duration.  Denies any discrete history of injury.  Does not report much in the way of weakness but localizes most of his pain to the superior aspect of the shoulder.  Denies any neck pain or radicular symptoms.              ROS: All systems reviewed are negative as they relate to the chief complaint within the history of present illness.  Patient denies  fevers or chills.   Assessment & Plan: Visit Diagnoses:  1. Chronic right shoulder pain   2. 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  We will inject that shoulder joint today under ultrasound guidance.  If it helps then we could consider injection of the left acromioclavicular joint as well.  The finger is complicated because of the severity of the flexion contracture.  That will need a referral to a hand surgeon.  I'll see him back next week if he wants to get his left shoulder acromioclavicular joint injected.  We will see if this helps his right shoulder first.  Follow-Up Instructions: No Follow-up on file.   Orders:  Orders Placed This Encounter  Procedures  . XR Shoulder Right  . XR Hand Complete Left  . Ambulatory referral to Orthopedic Surgery   No orders of the defined types were placed in this encounter.     Procedures: Medium Joint Inj Date/Time:  05/06/2017 2:56 PM Performed by: Cammy CopaEAN, SCOTT GREGORY Authorized by: Cammy CopaEAN, SCOTT GREGORY   Consent Given by:  Patient Site marked: the procedure site was marked   Timeout: prior to procedure the correct patient, procedure, and site was verified   Indications:  Pain and diagnostic evaluation Location:  Shoulder Site:  R acromioclavicular Prep: patient was prepped and draped in usual sterile fashion   Needle Size:  25 G Needle Length:  1.5 inches Approach:  Superior Ultrasound Guided: Yes   Fluoroscopic Guidance: No   Medications:  3 mL lidocaine 1 %; 20 mg triamcinolone acetonide 40 MG/ML; 0.66 mL bupivacaine 0.25 % Aspiration Attempted: No   Patient tolerance:  Patient tolerated the procedure well with no immediate complications     Clinical Data: No additional findings.  Objective: Vital Signs: There were no vitals taken for this visit.  Physical Exam:   Constitutional: Patient appears well-developed HEENT:  Head: Normocephalic Eyes:EOM are normal Neck: Normal range of motion Cardiovascular: Normal rate Pulmonary/chest: Effort normal Neurologic: Patient is alert Skin: Skin is warm Psychiatric: Patient has normal mood and affect    Ortho Exam: Orthopedic exam demonstrates good cervical spine range of motion no restriction of passive range of motion of the shoulders.  He has good rotator cuff strength isolated interspace super space subscap muscle testing.  Has acromioclavicular joint tenderness bilaterally with some mild swelling in this area.  Negative apprehension relocation testing no other masses lymph adenopathy or skin changes noted in the shoulder girdle region.  Left hand examined the patient has significant flexion contracture at the MCP and PIP joint consistent with Dupuytren's flexion contracture.  Specialty Comments:  No specialty comments available.  Imaging: No results found.   PMFS History: Patient Active Problem List   Diagnosis Date Noted  .  Hypertension 11/04/2013  . Blurry vision, bilateral 11/04/2013  . Pancreatitis 02/09/2012  . Alcohol abuse 02/09/2012  . TOBACCO ABUSE 10/19/2010  . COPD 10/19/2010  . PANCREATITIS, ACUTE 10/19/2010   Past Medical History:  Diagnosis Date  . COPD (chronic obstructive pulmonary disease) ([facility])   . Pancreatitis     No family history on file.  Past Surgical History:  Procedure Laterality Date  . NO PAST SURGERIES     Social History   Occupational History  . Not on file.   Social History Main Topics  . Smoking status: Current Every Day Smoker    Packs/day: 1.00    Years: 35.00  . Smokeless tobacco: Not on file  . Alcohol use 0.0 oz/week  . Drug use: No  . Sexual activity: Not on file

## 2017-07-02 ENCOUNTER — Encounter (INDEPENDENT_AMBULATORY_CARE_PROVIDER_SITE_OTHER): Payer: Self-pay | Admitting: Orthopedic Surgery

## 2017-09-14 MED ORDER — BUPIVACAINE HCL 0.25 % IJ SOLN
0.6600 mL | INTRAMUSCULAR | Status: AC | PRN
  Administered 2017-05-06: .66 mL via INTRA_ARTICULAR

## 2017-09-14 MED ORDER — TRIAMCINOLONE ACETONIDE 40 MG/ML IJ SUSP
20.0000 mg | INTRAMUSCULAR | Status: AC | PRN
  Administered 2017-05-06: 20 mg via INTRA_ARTICULAR

## 2017-09-14 MED ORDER — LIDOCAINE HCL 1 % IJ SOLN
3.0000 mL | INTRAMUSCULAR | Status: AC | PRN
  Administered 2017-05-06: 3 mL

## 2018-09-17 ENCOUNTER — Ambulatory Visit: Payer: Self-pay | Attending: Family Medicine | Admitting: Family Medicine

## 2018-09-17 ENCOUNTER — Other Ambulatory Visit: Payer: Self-pay

## 2018-09-17 ENCOUNTER — Encounter: Payer: Self-pay | Admitting: Family Medicine

## 2018-09-17 VITALS — BP 127/86 | HR 102 | Temp 98.8°F | Resp 18 | Ht 72.0 in | Wt 151.8 lb

## 2018-09-17 DIAGNOSIS — R1031 Right lower quadrant pain: Secondary | ICD-10-CM

## 2018-09-17 DIAGNOSIS — M79642 Pain in left hand: Secondary | ICD-10-CM | POA: Insufficient documentation

## 2018-09-17 DIAGNOSIS — Z7984 Long term (current) use of oral hypoglycemic drugs: Secondary | ICD-10-CM | POA: Insufficient documentation

## 2018-09-17 DIAGNOSIS — M25511 Pain in right shoulder: Secondary | ICD-10-CM | POA: Insufficient documentation

## 2018-09-17 DIAGNOSIS — R1903 Right lower quadrant abdominal swelling, mass and lump: Secondary | ICD-10-CM

## 2018-09-17 DIAGNOSIS — I10 Essential (primary) hypertension: Secondary | ICD-10-CM | POA: Insufficient documentation

## 2018-09-17 DIAGNOSIS — Z7689 Persons encountering health services in other specified circumstances: Secondary | ICD-10-CM | POA: Insufficient documentation

## 2018-09-17 DIAGNOSIS — M79641 Pain in right hand: Secondary | ICD-10-CM | POA: Insufficient documentation

## 2018-09-17 DIAGNOSIS — F17210 Nicotine dependence, cigarettes, uncomplicated: Secondary | ICD-10-CM | POA: Insufficient documentation

## 2018-09-17 DIAGNOSIS — E119 Type 2 diabetes mellitus without complications: Secondary | ICD-10-CM

## 2018-09-17 DIAGNOSIS — E785 Hyperlipidemia, unspecified: Secondary | ICD-10-CM | POA: Insufficient documentation

## 2018-09-17 DIAGNOSIS — M720 Palmar fascial fibromatosis [Dupuytren]: Secondary | ICD-10-CM

## 2018-09-17 DIAGNOSIS — G8929 Other chronic pain: Secondary | ICD-10-CM | POA: Insufficient documentation

## 2018-09-17 LAB — POCT URINALYSIS DIP (CLINITEK)
Bilirubin, UA: NEGATIVE
Blood, UA: NEGATIVE
Glucose, UA: 500 mg/dL — AB
Ketones, POC UA: NEGATIVE mg/dL
Leukocytes, UA: NEGATIVE
Nitrite, UA: NEGATIVE
POC,PROTEIN,UA: NEGATIVE
Spec Grav, UA: 1.015
Urobilinogen, UA: 0.2 U/dL
pH, UA: 5.5

## 2018-09-17 LAB — POCT GLYCOSYLATED HEMOGLOBIN (HGB A1C)
HbA1c POC (<> result, manual entry): 10.6 %
HbA1c, POC (controlled diabetic range): 10.6 % — AB (ref 0.0–7.0)
HbA1c, POC (prediabetic range): 10.6 % — AB (ref 5.7–6.4)
Hemoglobin A1C: 10.6 % — AB (ref 4.0–5.6)

## 2018-09-17 MED ORDER — GLUCOSE BLOOD VI STRP: ORAL_STRIP | 12 refills | Status: DC

## 2018-09-17 MED ORDER — TRUEPLUS LANCETS 28G MISC: 6 refills | Status: DC

## 2018-09-17 MED ORDER — TRUE METRIX AIR GLUCOSE METER W/DEVICE KIT: 1.0000 | PACK | Freq: Two times a day (BID) | 0 refills | Status: DC

## 2018-09-17 MED ORDER — GLIPIZIDE 5 MG PO TABS: 5.0000 mg | ORAL_TABLET | Freq: Two times a day (BID) | ORAL | 3 refills | Status: DC

## 2018-09-17 NOTE — Progress Notes (Signed)
Subjective:    Patient ID: [name], male    DOB: 22-Feb-1961, 57 y.o.   MRN: [id number]  HPI 57 year old male last seen at this office on 03/29/2017 for chronic right shoulder pain, hyperlipidemia and hypertension.  Patient presents to reestablish care. Patient with chief complaint at today's visit of a possible hernia in the right lower abdomen/area above the groin. Patient states the area has been present for 6 weeks to 2 months.  Patient states that he does not recall any acute onset of pain or bulging in this area.  Patient does not believe that he has any increased pain with lifting objects or coughing.  Patient states that he believes that he does have a hernia.      Patient also with complaint of his left little finger being stuck in a bent position for about 6 years.  Patient states that he would like to have a referral to have something done about his little finger.  Patient also with complaint of Dupuytren's contracture in the left hand.  Patient has not noticed any other fingers getting stuck.  Patient can no longer straighten out his left little finger and states that it has been this way for about 6 years now.  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  Patient does have pain in both hands.      Patient reports no known drug allergies.  Patient does smoke about 1 pack/day of cigarettes and states that he drinks beer on the weekends.  Patient has family history significant for a sister with prediabetes and patient states that his maternal grandfather had diabetes.  Patient does not recall anyone else in the family having contractures of the hands.  Patient denies any past diagnosis of diabetes.  Patient denies any increased thirst or urinary frequency.  Patient denies any unexplained weight loss but states that his weight tends to fluctuate up and down.  Patient denies any cough or shortness of breath, no chest pain or palpitations.  Patient  denies any dysuria.    Past Medical History:  Diagnosis Date  . COPD (chronic obstructive pulmonary disease) ([facility])   . Pancreatitis    Past Surgical History:  Procedure Laterality Date  . NO PAST SURGERIES     Social History   Tobacco Use  . Smoking status: Current Every Day Smoker    Packs/day: 1.00    Years: 35.00    Pack years: 35.00  . Smokeless tobacco: Never Used  Substance Use Topics  . Alcohol use: Yes    Comment: occassionally   . Drug use: No  No Known Allergies    Review of Systems     Objective:   Physical Exam BP 127/86   Pulse (!) 102   Temp 98.8 F (37.1 C) (Oral)   Resp 18   Ht 6' (1.829 m)   Wt 151 lb 12.8 oz (68.9 kg)   SpO2 97%   BMI 20.59 kg/m Nurse's notes and vital signs reviewed General-well-nourished, well-developed but thin framed older male in no acute distress. Lungs-clear to auscultation bilaterally with decreased lung sounds with decreased breath sounds at the lung bases Cardiovascular-regular rate and rhythm Abdomen- abdomen is soft and no reproducible abdominal discomfort on exam.  Patient does have a small nodule slightly smaller than the gumball in the right lower abdomen/pelvis above the groin area/inguinal canal.  Nodule is compressible.  Patient does not have any discomfort or bulge within the actual  inguinal canal on exam. Back-no CVA tenderness Extremities-no edema Musculoskeletal- patient's left little finger is contracted into a flexed position.  Patient has palmar nodules and fibromatosis of the palms bilaterally left greater than right        Assessment & Plan:  1. Right lower quadrant abdominal mass I discussed with the patient that I did not really appreciate an inguinal hernia on exam but I did feel the nodule in the right lower quadrant/pelvic area that patient states has been present and may possibly be slightly bigger over the past 2 months.  I spoke with radiology who suggested that patient have a CT of the  abdomen and pelvis with contrast.  Patient will have BMP at today's visit to check renal function prior to having the CT with contrast. - CT Abdomen Pelvis W Contrast; Future - Basic Metabolic Panel  2. Dupuytren's contracture of left hand Patient with Dupuytren's contracture of the left little finger and patient with bilateral palmar fibromatosis.  Patient will be referred to hand surgery.  Since this type of contracture can be more common in patients with diabetes, patient will also have hemoglobin A1c at today's visit. - Ambulatory referral to Hand Surgery - HgB A1c  3. 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  Patient will also have hemoglobin A1c to look for presence of diabetes - Ambulatory referral to Hand Surgery - HgB A1c  4. Right lower quadrant abdominal pain Patient will have CT of the abdomen and pelvis in follow-up of patient's complaint of an uncomfortable nodule in the right lower quadrant - CT Abdomen Pelvis W Contrast; Future  5. Newly diagnosed diabetes [facility]) Patient had hemoglobin A1c done at today's visit in follow-up of his Dupuytren's contracture of the left little finger and palmar fibromatosis.  Patient's hemoglobin A1c was elevated at 10.6 consistent with a diagnosis of diabetes.  Since patient is scheduled for upcoming CT scan with contrast, patient will not be placed on metformin at this time.  Patient will have BMP to check renal function.  Patient had a urinalysis which did not show any ketones.  Patient will be started on Glucotrol 5 mg twice daily before meals and prescription was sent to this pharmacy for patient to obtain a glucometer, strips and lancets for up to 3 times daily testing of his blood sugars.  Patient should check blood sugar fasting, about 2 hours after his largest meal of the day and at bedtime.  Patient is to return to clinic in 1 week for follow-up  with the clinical pharmacist in case he needs instructions with use of glucometer/monitoring and if patient has had his CT scan by that time, patient will also need repeat BMP in follow-up of renal function.  Patient will follow-up with PCP in 2 to 3 weeks. - POCT URINALYSIS DIP (CLINITEK) - glipiZIDE (GLUCOTROL) 5 MG tablet; Take 1 tablet (5 mg total) by mouth 2 (two) times daily before a meal.  Dispense: 60 tablet; Refill: 3 - Blood Glucose Monitoring Suppl (TRUE METRIX AIR GLUCOSE METER) w/Device KIT; 1 kit by Does not apply route 2 (two) times daily at 8 am and 10 pm.  Dispense: 1 kit; Refill: 0 - glucose blood (TRUE METRIX BLOOD GLUCOSE TEST) test strip; Use as instructed-fasting before breakfast; 2 hours after largest meal of the day and before bedtime  Dispense: 100 each; Refill: 12  *Patient was offered but declined influenza  immunization at today's visit but may return to influenza clinic on Thursday to have this done  An After Visit Summary was printed and given to the patient.  Return for 1 week f/u with Lurena Joiner; 2-3 weeks with PCP.

## 2018-09-17 NOTE — Progress Notes (Signed)
Flu post pone   Pain: 0   Left hand pinky, and hernia more immediate   cbg 294

## 2018-09-18 LAB — BASIC METABOLIC PANEL WITH GFR
BUN/Creatinine Ratio: 20 (ref 9–20)
BUN: 14 mg/dL (ref 6–24)
CO2: 23 mmol/L (ref 20–29)
Calcium: 9.5 mg/dL (ref 8.7–10.2)
Chloride: 101 mmol/L (ref 96–106)
Creatinine, Ser: 0.7 mg/dL — ABNORMAL LOW (ref 0.76–1.27)
GFR calc Af Amer: 119 mL/min/1.73
GFR calc non Af Amer: 103 mL/min/1.73
Glucose: 310 mg/dL — ABNORMAL HIGH (ref 65–99)
Potassium: 4.7 mmol/L (ref 3.5–5.2)
Sodium: 141 mmol/L (ref 134–144)

## 2018-09-26 ENCOUNTER — Encounter: Payer: Self-pay | Admitting: Pharmacist

## 2018-09-26 ENCOUNTER — Ambulatory Visit: Payer: Self-pay | Attending: Family Medicine | Admitting: Pharmacist

## 2018-09-26 ENCOUNTER — Ambulatory Visit (HOSPITAL_COMMUNITY)
Admission: RE | Admit: 2018-09-26 | Discharge: 2018-09-26 | Disposition: A | Discharge status: Home / Self Care | Payer: Self-pay | Source: Ambulatory Visit | Attending: Family Medicine | Admitting: Family Medicine

## 2018-09-26 DIAGNOSIS — E119 Type 2 diabetes mellitus without complications: Secondary | ICD-10-CM

## 2018-09-26 DIAGNOSIS — E1165 Type 2 diabetes mellitus with hyperglycemia: Secondary | ICD-10-CM

## 2018-09-26 DIAGNOSIS — R1031 Right lower quadrant pain: Secondary | ICD-10-CM | POA: Insufficient documentation

## 2018-09-26 DIAGNOSIS — R1903 Right lower quadrant abdominal swelling, mass and lump: Secondary | ICD-10-CM | POA: Insufficient documentation

## 2018-09-26 DIAGNOSIS — K861 Other chronic pancreatitis: Secondary | ICD-10-CM | POA: Insufficient documentation

## 2018-09-26 DIAGNOSIS — K760 Fatty (change of) liver, not elsewhere classified: Secondary | ICD-10-CM | POA: Insufficient documentation

## 2018-09-26 LAB — POCT I-STAT CREATININE: Creatinine, Ser: 0.6 mg/dL — ABNORMAL LOW (ref 0.61–1.24)

## 2018-09-26 MED ORDER — IOHEXOL 300 MG/ML  SOLN
100.0000 mL | Freq: Once | INTRAMUSCULAR | Status: AC | PRN
  Administered 2018-09-26: 100 mL via INTRAVENOUS

## 2018-09-26 MED ORDER — METFORMIN HCL 500 MG PO TABS: 500.0000 mg | ORAL_TABLET | Freq: Two times a day (BID) | ORAL | 0 refills | Status: DC

## 2018-09-26 MED ORDER — ATORVASTATIN CALCIUM 40 MG PO TABS: 40.0000 mg | ORAL_TABLET | Freq: Every day | ORAL | 2 refills | Status: DC

## 2018-09-26 MED FILL — !TRUE METRIX BLOOD GLUCOSE: 365 days supply | Qty: 1 | Fill #0

## 2018-09-26 MED FILL — metFORMIN HCL 500 MG TABS: 500 | 30 days supply | Qty: 60 | Fill #0

## 2018-09-26 MED FILL — ATORVASTATIN CALCIUM 40 MG: 40 | 30 days supply | Qty: 30 | Fill #0

## 2018-09-26 MED FILL — TRUEplus LANCETS 28G MISC: 25 days supply | Qty: 100 | Fill #0

## 2018-09-26 MED FILL — TRUE METRIX TEST STRIP: 25 days supply | Qty: 100 | Fill #0

## 2018-09-26 NOTE — Progress Notes (Signed)
    S:    PCP: Dr. Jillyn HiddenFulp No chief complaint on file.  Patient arrives in good spirts. Presents for diabetes management. Patient was referred on 09/17/18 by Dr Jillyn HiddenFulp. Dr. Jillyn HiddenFulp dx the patient with DM after an elevated A1c of 10.6 was found. Pt has a CT scan scheduled for today. Dr. Jillyn HiddenFulp chose not to start metformin during her encounter because of the CT scan today. She started glipizide 5 mg BID.   Today, pt reports feeling overwhelmed with information. He did not pick up his medications or testing supplies. Denies polydipsia, polyuria, or polyphagia. Denies neuropathy or blurred vision. Denies hypoglycemia symptoms. Pt does not limit carbohydrates. He does not exercise.   Family/Social History:  - FHx: pre-DM (sister) - Tobacco: smokes 1 PPD (35 years) - Alcohol: 1-4 drinks on the weekends  Insurance coverage/medication affordability:  - Self-pay  Patient denies adherence with medications.  Current diabetes medications include:  - Glipizide 5 mg BID  O:  POCT: 294 Does not check at home  Lab Results  Component Value Date   HGBA1C 10.6 (A) 09/17/2018   HGBA1C 10.6 09/17/2018   HGBA1C 10.6 (A) 09/17/2018   HGBA1C 10.6 (A) 09/17/2018   There were no vitals filed for this visit.  Lipid Panel     Component Value Date/Time   CHOL 201 (H) 09/26/2018 1132   TRIG 62 09/26/2018 1132   HDL 88 09/26/2018 1132   CHOLHDL 2.3 09/26/2018 1132   CHOLHDL 6.0 07/09/2011 0600   VLDL 27 07/09/2011 0600   LDLCALC 101 (H) 09/26/2018 1132   Clinical ASCVD: No  10 year ASCVD risk: 15.7%  A/P: Diabetes newly diagnosed currently uncontrolled. Patient is able to verbalize appropriate hypoglycemia management plan. Patient is not adherent with medication. With pancreatitis history, patient may benefit from a referral to Endo. His A1c is high enough to consider injectable therapy.  With CT scan later today, pt will need re-check of renal function next week. We can start metformin if GFR is WNL. Of  note, metformin might not be ideal with pt's alcohol history. Today, he reported varying his amount of alcohol consumed. Will get LFTs and discuss with PCP next week.    -Hold metformin 500 mg BID until further instructed -Encouraged pt to pick up testing supplies and medications from the pharmacy. -Extensively discussed pathophysiology of DM, recommended lifestyle interventions, dietary effects on glycemic control -Counseled on s/sx of and management of hypoglycemia -HM: UTD on PNA and tetanus vaccines; will discuss influenza at next encounter.  -Next A1C anticipated 12/2018.   ASCVD risk - primary prevention in patient with DM. Last LDL is not controlled. Moderate or high intensity statin indicated. Encouraged patient to limit or avoid alcohol.  -Started atorvastatin 40 mg.  -Lipid panel -Hepatic function panel   Written patient instructions provided. Total time in face to face counseling 15 minutes.   Follow up Pharmacist Clinic Visit in 1 week.    Butch PennyLuke Van Ausdall, PharmD, CPP Clinical Pharmacist [facility] [phone]

## 2018-09-26 NOTE — Patient Instructions (Signed)
Thank you for coming to see me today. Please do the following:  1. Pick-up your medications from the pharmacy.  2. Do NOT start them until Monday. 3. Start checking blood sugars at home.  4. Continue making the lifestyle changes we've discussed together during our visit. Diet and exercise play a significant role in improving your blood sugars.  5. Follow-up with me in 1 week.

## 2018-09-27 ENCOUNTER — Telehealth (INDEPENDENT_AMBULATORY_CARE_PROVIDER_SITE_OTHER): Payer: Self-pay

## 2018-09-27 ENCOUNTER — Telehealth: Payer: Self-pay

## 2018-09-27 LAB — HEPATIC FUNCTION PANEL
ALT: 35 IU/L (ref 0–44)
AST: 28 IU/L (ref 0–40)
Albumin: 4.9 g/dL (ref 3.5–5.5)
Alkaline Phosphatase: 100 IU/L (ref 39–117)
BILIRUBIN TOTAL: 1.2 mg/dL (ref 0.0–1.2)
Bilirubin, Direct: 0.27 mg/dL (ref 0.00–0.40)
TOTAL PROTEIN: 7.4 g/dL (ref 6.0–8.5)

## 2018-09-27 LAB — LIPID PANEL
CHOLESTEROL TOTAL: 201 mg/dL — AB (ref 100–199)
Chol/HDL Ratio: 2.3 ratio (ref 0.0–5.0)
HDL: 88 mg/dL (ref >39)
LDL Calculated: 101 mg/dL — ABNORMAL HIGH (ref 0–99)
Triglycerides: 62 mg/dL (ref 0–149)
VLDL Cholesterol Cal: 12 mg/dL (ref 5–40)

## 2018-09-27 LAB — GLUCOSE, POCT (MANUAL RESULT ENTRY): POC Glucose: 294 mg/dl — AB (ref 70–99)

## 2018-09-27 NOTE — Telephone Encounter (Signed)
Patient verified DOB. He is aware that CT did have findings associated with chronic pancreatitis as well as a large cyst in the head of the pancrease. Advised patient to keep follow up with PCP on 11/22 at 1:30. [name] S Joelle [place], CMA

## 2018-09-27 NOTE — Telephone Encounter (Signed)
-----   Message from Cain Saupeammie Fulp, MD sent at 09/27/2018 11:03 AM EST ----- Notify patient that his CT of the abdomen and pelvis had findings associated with chronic pancreatitis as well as a large cyst in the head of the pancreas; keep f/u appointment

## 2018-09-27 NOTE — Telephone Encounter (Signed)
-----   Message from Hoy RegisterEnobong Newlin, MD sent at 09/27/2018 11:14 AM EST ----- Labs are stable.

## 2018-09-27 NOTE — Telephone Encounter (Signed)
Patient was called and voicemail is full and not able to accept any new messages.

## 2018-10-04 ENCOUNTER — Encounter: Payer: Self-pay | Admitting: Pharmacist

## 2018-10-04 ENCOUNTER — Ambulatory Visit: Payer: Self-pay | Attending: Family Medicine | Admitting: Pharmacist

## 2018-10-04 DIAGNOSIS — Z833 Family history of diabetes mellitus: Secondary | ICD-10-CM | POA: Insufficient documentation

## 2018-10-04 DIAGNOSIS — E119 Type 2 diabetes mellitus without complications: Secondary | ICD-10-CM | POA: Insufficient documentation

## 2018-10-04 DIAGNOSIS — E1165 Type 2 diabetes mellitus with hyperglycemia: Secondary | ICD-10-CM

## 2018-10-04 LAB — GLUCOSE, POCT (MANUAL RESULT ENTRY): POC Glucose: 280 mg/dL — AB (ref 70–99)

## 2018-10-04 NOTE — Progress Notes (Signed)
    S:    PCP: Dr. Jillyn HiddenFulp No chief complaint on file.  Patient arrives in good spirts. Presents for diabetes management. Patient was referred on 09/17/18 by Dr Jillyn HiddenFulp. I last saw him 09/16/18. Provided pt with prescriptions and instructed him not to take until follow-up after his CT.   Denies polydipsia, polyuria, or polyphagia. Denies neuropathy or blurred vision. Denies hypoglycemia symptoms. Pt has worked to improve his diet in the past week. Moving at work but not exercising outside of work.   Family/Social History:  - FHx: pre-DM (sister) - Tobacco: smokes 1 PPD (35 years) - Alcohol: 6 days since last drink  Insurance coverage/medication affordability:  - Self-pay  Patient is holding metformin right now.  O:  POCT: 280 Does not check at home  Lab Results  Component Value Date   HGBA1C 10.6 (A) 09/17/2018   HGBA1C 10.6 09/17/2018   HGBA1C 10.6 (A) 09/17/2018   HGBA1C 10.6 (A) 09/17/2018   There were no vitals filed for this visit.  Lipid Panel     Component Value Date/Time   CHOL 201 (H) 09/26/2018 1132   TRIG 62 09/26/2018 1132   HDL 88 09/26/2018 1132   CHOLHDL 2.3 09/26/2018 1132   CHOLHDL 6.0 07/09/2011 0600   VLDL 27 07/09/2011 0600   LDLCALC 101 (H) 09/26/2018 1132   Clinical ASCVD: No  10 year ASCVD risk: 15.7%  A/P: Diabetes longstanding currently uncontrolled. Patient is able to verbalize appropriate hypoglycemia management plan. Will obtain labs today. He can start metformin if labs are okay.   -Hold metformin 500 mg BID until further instructed -Encouraged/demonstrated self-measurement of glucose -Extensively discussed pathophysiology of DM, recommended lifestyle interventions, dietary effects on glycemic control -Counseled on s/sx of and management of hypoglycemia -Next A1C anticipated 12/2018.   ASCVD risk - primary prevention in patient with DM. Last LDL is not controlled. Moderate or high intensity statin indicated. Encouraged patient to continue  limiting alcohol. LFTs WNL.  -Started atorvastatin 40 mg.   Written patient instructions provided. Total time in face to face counseling 15 minutes.   Follow up w/ PCP 10/15/18.  Butch PennyLuke Van Ausdall, PharmD, CPP Clinical Pharmacist [facility] [phone]

## 2018-10-04 NOTE — Patient Instructions (Signed)
Thank you for coming to see me today. Please do the following:  1. We'll get labs today. 2. If these are good, you can start metformin Monday.  3. Start atorvastatin. 4. Start checking blood sugars at home once a day before eating anything. Check again later in the day, 2 hours after eating supper.  5. Continue making the lifestyle changes we've discussed together during our visit. Diet and exercise play a significant role in improving your blood sugars.  6. Follow-up with financial Monday.    Hypoglycemia or low blood sugar:   Low blood sugar can happen quickly and may become an emergency if not treated right away.   While this shouldn't happen often, it can be brought upon if you skip a meal or do not eat enough. Also, if your insulin or other diabetes medications are dosed too high, this can cause your blood sugar to go to low.   Warning signs of low blood sugar include: 1. Feeling shaky or dizzy 2. Feeling weak or tired  3. Excessive hunger 4. Feeling anxious or upset  5. Sweating even when you aren't exercising  What to do if I experience low blood sugar? 1. Check your blood sugar with your meter. If lower than 70, proceed to step 2.  2. Treat with 3-4 glucose tablets or 3 packets of regular sugar. If these aren't around, you can try hard candy. Yet another option would be to drink 4 ounces of fruit juice or 6 ounces of REGULAR soda.  3. Re-check your sugar in 15 minutes. If it is still below 70, do what you did in step 2 again. If has come back up, go ahead and eat a snack or small meal at this time.

## 2018-10-05 LAB — CMP14+EGFR
ALT: 21 IU/L (ref 0–44)
AST: 14 IU/L (ref 0–40)
Albumin/Globulin Ratio: 2 (ref 1.2–2.2)
Albumin: 4.4 g/dL (ref 3.5–5.5)
Alkaline Phosphatase: 75 IU/L (ref 39–117)
BUN/Creatinine Ratio: 15 (ref 9–20)
BUN: 10 mg/dL (ref 6–24)
Bilirubin Total: 0.9 mg/dL (ref 0.0–1.2)
CO2: 24 mmol/L (ref 20–29)
Calcium: 9.3 mg/dL (ref 8.7–10.2)
Chloride: 94 mmol/L — ABNORMAL LOW (ref 96–106)
Creatinine, Ser: 0.65 mg/dL — ABNORMAL LOW (ref 0.76–1.27)
GFR calc Af Amer: 123 mL/min/1.73
GFR calc non Af Amer: 106 mL/min/1.73
Globulin, Total: 2.2 g/dL (ref 1.5–4.5)
Glucose: 247 mg/dL — ABNORMAL HIGH (ref 65–99)
Potassium: 4 mmol/L (ref 3.5–5.2)
Sodium: 135 mmol/L (ref 134–144)
Total Protein: 6.6 g/dL (ref 6.0–8.5)

## 2018-10-07 ENCOUNTER — Telehealth: Payer: Self-pay | Admitting: Pharmacist

## 2018-10-07 ENCOUNTER — Ambulatory Visit: Payer: Self-pay

## 2018-10-07 NOTE — Telephone Encounter (Signed)
Pt with recent CT scan on 09/26/18. He wasn't able to get follow-up CMP until last Friday (10/04/18).  Renal function from Friday is WNL. Advised pt to start metformin and keep follow-up on 10/15/18 with Dr. Jillyn HiddenFulp.

## 2018-10-15 ENCOUNTER — Other Ambulatory Visit: Payer: Self-pay | Admitting: Family Medicine

## 2018-10-15 ENCOUNTER — Encounter: Payer: Self-pay | Admitting: Family Medicine

## 2018-10-15 ENCOUNTER — Ambulatory Visit: Payer: Self-pay

## 2018-10-15 ENCOUNTER — Ambulatory Visit: Payer: Self-pay | Attending: Family Medicine | Admitting: Family Medicine

## 2018-10-15 VITALS — BP 148/80 | HR 85 | Temp 99.0°F | Resp 18 | Ht 73.0 in | Wt 150.0 lb

## 2018-10-15 DIAGNOSIS — K863 Pseudocyst of pancreas: Secondary | ICD-10-CM

## 2018-10-15 DIAGNOSIS — F17210 Nicotine dependence, cigarettes, uncomplicated: Secondary | ICD-10-CM | POA: Insufficient documentation

## 2018-10-15 DIAGNOSIS — K861 Other chronic pancreatitis: Secondary | ICD-10-CM

## 2018-10-15 DIAGNOSIS — J449 Chronic obstructive pulmonary disease, unspecified: Secondary | ICD-10-CM | POA: Insufficient documentation

## 2018-10-15 DIAGNOSIS — E119 Type 2 diabetes mellitus without complications: Secondary | ICD-10-CM

## 2018-10-15 LAB — GLUCOSE, POCT (MANUAL RESULT ENTRY): POC Glucose: 243 mg/dL — AB (ref 70–99)

## 2018-10-15 NOTE — Progress Notes (Signed)
Subjective:    Patient ID: [name], male    DOB: 07-Nov-1961, 57 y.o.   MRN: [id number]  HPI       57 yo male seen in follow-up of recently diagnosed diabetes and patient at his last visit had right lower quadrant pain and was sent for an abdominal/pelvic CT scan.  Patient is here in follow-up of abnormal findings of a pancreatic pseudocyst on CT scan.  Patient would like to have more information on the condition.  Patient states that he just recently started use the metformin because he had to return for repeat lab work to check his renal function due to the IV contrast given for the CT scan before starting the metformin.  Patient does have glucose monitoring equipment.  Patient denies any nausea or vomiting and no diarrhea or stomach upset with the use of metformin.  Patient has not yet started the glipizide.  Patient with some mild urinary frequency but patient states that he also drinks a lot of fluids.      Patient reports that he no longer drinks alcohol.  Patient denies any abdominal pain.  No nausea/vomiting or diarrhea.  No loose stools.  No blood in the stool or dark stools.  Patient reports that the right lower quadrant pain that is last visit has completely resolved.  Past Medical History:  Diagnosis Date  . COPD (chronic obstructive pulmonary disease) (HCC)   . Pancreatitis    Past Surgical History:  Procedure Laterality Date  . NO PAST SURGERIES     Family History  Problem Relation Age of Onset  . Diabetes Sister   . Social History   Tobacco Use  . Smoking status: Current Every Day Smoker    Packs/day: 1.00    Years: 35.00    Pack years: 35.00  . Smokeless tobacco: Never Used  Substance Use Topics  . Alcohol use: Yes    Comment: occassionally   . Drug use: No  No Known Allergies   Review of Systems  Constitutional: Negative for chills, fatigue and fever.  Respiratory: Negative for cough and shortness of breath.   Cardiovascular: Negative for chest  pain, palpitations and leg swelling.  Gastrointestinal: Negative for abdominal pain, blood in stool, diarrhea, nausea and vomiting.  Endocrine: Negative for polydipsia and polyphagia.  Genitourinary: Positive for frequency. Negative for dysuria.  Musculoskeletal: Negative for arthralgias and joint swelling.  Neurological: Negative for dizziness and headaches.  Hematological: Negative for adenopathy. Does not bruise/bleed easily.       Objective:   Physical Exam BP (!) 148/80 (BP Location: Left Arm, Patient Position: Sitting, Cuff Size: Normal)   Pulse 85   Temp 99 F (37.2 C) (Oral)   Resp 18   Ht 6\' 1"  (1.854 m)   Wt 150 lb (68 kg)   SpO2 97%   BMI 19.79 kg/m  Nurse's notes and vital signs reviewed General-well-nourished, well-developed older man in no acute distress Neck-supple, lymphadenopathy, no thyromegaly Lungs-clear to auscultation bilaterally Cardiovascular-regular rate and rhythm Back-no CVA tenderness Abdomen-normal to slightly hyperactive bowel sounds, soft and nontender        Assessment & Plan:  1. Newly diagnosed diabetes [facility]) Patient is status post diagnosis of diabetes.  Patient reports that he only recently started the metformin because he had to have recheck of creatinine after receiving contrast for CT scan.  Patient's blood sugar at today's visit was still elevated at 243.  Patient is encouraged to start use of the glipizide that  was prescribed in addition to the metformin.  Patient should monitor blood sugars and discussed initial goal of fasting blood sugar of 140 or less and postprandial blood sugar between 1 40-1 60 or less within 2 hours after eating.  Patient should call or return if he has any concerns.  Also discussed with the patient that due to his chronic pancreatitis a C-peptide level will be checked to see if he is still making adequate insulin. (Insulin level not done as patient is nonfasting at today's visit.) - Glucose (CBG) - C-peptide  2.  Chronic pancreatitis, unspecified pancreatitis type [facility](HCC) Patient with recent CT of the abdomen and pelvis on 09/26/2018 which showed changes consistent with chronic pancreatitis in addition to a pancreatic pseudocyst.  Patient will have lipase level though patient reports no current abdominal pain, no nausea or vomiting and does not have any epigastric tenderness on exam.  Patient also reports that he has stopped drinking alcohol. - Ambulatory referral to Gastroenterology - Lipase - C-peptide  3. Pancreatic pseudocyst Patient with presence of a pancreatic pseudocyst seen on recent CT of the abdomen and pelvis.  Discussed with the patient that a pseudocyst is an encapsulated area of fluid within the pancreas which is likely secondary to past issues with chronic inflammation/pancreatitis.  Patient will be referred to gastroenterology for further evaluation.  Will obtain lipase level at today's visit. - Ambulatory referral to Gastroenterology - Lipase  An After Visit Summary was printed and given to the patient.  Return in about 2 months (around 12/16/2018) for DM:2-3 month f/u, sooner if needed.

## 2018-10-15 NOTE — Patient Instructions (Signed)
Chronic Pancreatitis Chronic pancreatitis is long-lasting inflammation and scarring of the pancreas. The pancreas is a gland that is located behind the stomach. It produces enzymes that help to digest food. The pancreas also releases the hormones glucagon and insulin, which help to regulate blood sugar. Damage to the pancreas may affect digestion, cause pain in the upper abdomen and back, and cause diabetes. Inflammation can also irritate other abdominal organs near the pancreas. At the very beginning, pancreatitis may be sudden (acute). If acute pancreatitis is not caught in time or treated effectively, or if you have several or prolonged episodes of acute pancreatitis, then the condition can turn into chronic pancreatitis. What are the causes? The most common cause of this condition is alcohol abuse. Other causes include:  High levels of triglycerides in the blood (hypertriglyceridemia).  Gallstones or other conditions that can block the tube that drains the pancreas (pancreatic duct).  Pancreatic cancer.  Cystic fibrosis.  Too much calcium in the blood (hypercalcemia), which may be caused by an overactive parathyroid gland (hyperparathyroidism).  Certain medicines.  Injury to the pancreas.  Infection.  Autoimmune pancreatitis. This is when the body's disease-fighting (immune) system attacks the pancreas.  Genes that are passed along from parent to child (inherited).  In some cases, the cause may not be known. What increases the risk? This condition is more likely to develop in:  Men.  People who are 40-60 years old.  What are the signs or symptoms? Symptoms of this condition may include:  Abdominal pain. Pain may also be felt in the upper back and may get worse after eating.  Nausea and vomiting.  Fever.  Weight loss.  A change in the color and consistency of bowel movements, such as diarrhea.  How is this diagnosed? This condition is diagnosed based on your  symptoms, your medical history, and a physical exam. You may have tests, such as:  Blood tests.  Stool samples.  Biopsy of the pancreas. This is the removal of a small amount of pancreas tissue to be tested in a lab.  Imaging studies, such as: ? X-rays. ? CT scan. ? MRI. ? Ultrasound.  How is this treated? The goal of treatment is to help relieve symptoms and to prevent complications from occurring. Treatment focuses on:  Resting the pancreas. You may need to stop eating and drinking for a few days while in the hospital to give your pancreas time to recover. During this time, you will be given IV fluids to keep you hydrated.  Controlling pain. You may be given pain medicines by mouth (orally) or as injections.  Improving digestion. You may be given: ? Medicines to help balance your enzymes. ? Vitamin supplements. ? A specific diet to follow. If you are given a diet, you may work with a specialist (dietitian).  Preventing diabetes. You may need insulin injections.  You may have surgery to:  Clear the pancreatic ducts of any blockages, such as gallstones.  Remove any fluid or damaged tissue from the pancreas.  Follow these instructions at home:  Take over-the-counter and prescription medicines only as told by your health care provider. This includes any vitamin supplements.  Do not drive or operate heavy machinery while taking prescription pain medicines.  Drink enough fluid to keep your urine clear or pale yellow.  Do not drink alcohol. If you need help quitting, ask your health care provider.  Do not use any tobacco products, such as cigarettes, chewing tobacco, and e-cigarettes. If you need help   quitting, ask your health care provider.  Follow a diet as told by your health care provider or dietitian, if this applies. This may include: ? Limiting how much fat you eat. ? Eating smaller meals more often. ? Avoiding caffeine.  Keep all follow-up visits as told by your  health care provider. This is important. Contact a health care provider if:  You have pain that does not get better with medicine.  You have a fever. Get help right away if:  Your pain suddenly gets worse.  You have sudden abdominal swelling.  You start to vomit often or you vomit blood.  You have diarrhea that does not go away.  You have blood in your stool. This information is not intended to replace advice given to you by your health care provider. Make sure you discuss any questions you have with your health care provider. Document Released: 11/26/2015 Document Revised: 04/06/2016 Document Reviewed: 10/07/2014 Elsevier Interactive Patient Education  2018 Elsevier Inc.  

## 2018-10-17 LAB — C-PEPTIDE: C-Peptide: 1.4 ng/mL (ref 1.1–4.4)

## 2018-10-17 LAB — LIPASE: Lipase: 62 U/L (ref 13–78)

## 2018-10-18 ENCOUNTER — Telehealth: Payer: Self-pay | Admitting: *Deleted

## 2018-10-18 NOTE — Telephone Encounter (Signed)
MA unable to reach patient of leave a VM !!!Please inform patient of the amount of insulin his body produces is normal but at the lower end so he needs to take the glipizide and metformin to help. Patients lipase is also normal from when it was abnormal in 2014!!!

## 2018-10-18 NOTE — Telephone Encounter (Signed)
-----   Message from Cain Saupeammie Fulp, MD sent at 10/18/2018 12:39 PM EST ----- Please notify patient that his c-peptide level which indicates how much insulin his body is producing is normal but at the lower end of normal at 1.4 (normal range is 1.1-4.4). He should take the metformin and glipizide to help with his blood sugars. His lipase (which is a pancreatic enzyme) is normal at 62 (normal 13-78) but in Feb of 2014 his level was 604

## 2019-01-08 ENCOUNTER — Telehealth: Payer: Self-pay | Admitting: Family Medicine

## 2019-01-08 MED ORDER — METFORMIN HCL 500 MG PO TABS: 500.0000 mg | ORAL_TABLET | Freq: Two times a day (BID) | ORAL | 0 refills | Status: DC

## 2019-01-08 NOTE — Telephone Encounter (Signed)
1) Medication(s) Requested (by name): Metformin   2) Pharmacy of Choice:  [facility]

## 2019-01-30 MED FILL — metFORMIN HCL 500 MG TABS: 500 | 30 days supply | Qty: 60 | Fill #0

## 2019-03-26 ENCOUNTER — Ambulatory Visit: Payer: Self-pay | Attending: Family Medicine | Admitting: Family Medicine

## 2019-03-26 ENCOUNTER — Other Ambulatory Visit: Payer: Self-pay

## 2019-05-20 ENCOUNTER — Other Ambulatory Visit: Payer: Self-pay

## 2019-05-20 ENCOUNTER — Emergency Department (HOSPITAL_COMMUNITY)
Admission: EM | Admit: 2019-05-20 | Discharge: 2019-05-20 | Disposition: A | Discharge status: Home / Self Care | Payer: Self-pay | Attending: Emergency Medicine | Admitting: Emergency Medicine

## 2019-05-20 DIAGNOSIS — I10 Essential (primary) hypertension: Secondary | ICD-10-CM | POA: Insufficient documentation

## 2019-05-20 DIAGNOSIS — J449 Chronic obstructive pulmonary disease, unspecified: Secondary | ICD-10-CM | POA: Insufficient documentation

## 2019-05-20 DIAGNOSIS — Y908 Blood alcohol level of 240 mg/100 ml or more: Secondary | ICD-10-CM | POA: Insufficient documentation

## 2019-05-20 DIAGNOSIS — F10920 Alcohol use, unspecified with intoxication, uncomplicated: Secondary | ICD-10-CM

## 2019-05-20 DIAGNOSIS — Z79899 Other long term (current) drug therapy: Secondary | ICD-10-CM | POA: Insufficient documentation

## 2019-05-20 DIAGNOSIS — F10120 Alcohol abuse with intoxication, uncomplicated: Secondary | ICD-10-CM | POA: Insufficient documentation

## 2019-05-20 DIAGNOSIS — F17210 Nicotine dependence, cigarettes, uncomplicated: Secondary | ICD-10-CM | POA: Insufficient documentation

## 2019-05-20 LAB — COMPREHENSIVE METABOLIC PANEL
ALT: 35 U/L (ref 0–44)
AST: 28 U/L (ref 15–41)
Albumin: 4.3 g/dL (ref 3.5–5.0)
Alkaline Phosphatase: 69 U/L (ref 38–126)
Anion gap: 10 (ref 5–15)
BUN: <5 mg/dL — ABNORMAL LOW (ref 6–20)
CO2: 29 mmol/L (ref 22–32)
Calcium: 8.3 mg/dL — ABNORMAL LOW (ref 8.9–10.3)
Chloride: 103 mmol/L (ref 98–111)
Creatinine, Ser: 0.62 mg/dL (ref 0.61–1.24)
GFR calc Af Amer: >60 mL/min (ref >60)
GFR calc non Af Amer: >60 mL/min (ref >60)
Glucose, Bld: 244 mg/dL — ABNORMAL HIGH (ref 70–99)
Potassium: 4.2 mmol/L (ref 3.5–5.1)
Sodium: 142 mmol/L (ref 135–145)
Total Bilirubin: 0.8 mg/dL (ref 0.3–1.2)
Total Protein: 7.3 g/dL (ref 6.5–8.1)

## 2019-05-20 LAB — CBC WITH DIFFERENTIAL/PLATELET
Abs Immature Granulocytes: 0.02 10*3/uL (ref 0.00–0.07)
Basophils Absolute: 0.1 10*3/uL (ref 0.0–0.1)
Basophils Relative: 1 %
Eosinophils Absolute: 0.2 10*3/uL (ref 0.0–0.5)
Eosinophils Relative: 3 %
HCT: 47.9 % (ref 39.0–52.0)
Hemoglobin: 15.8 g/dL (ref 13.0–17.0)
Immature Granulocytes: 0 %
Lymphocytes Relative: 23 %
Lymphs Abs: 1.5 10*3/uL (ref 0.7–4.0)
MCH: 33.1 pg (ref 26.0–34.0)
MCHC: 33 g/dL (ref 30.0–36.0)
MCV: 100.4 fL — ABNORMAL HIGH (ref 80.0–100.0)
Monocytes Absolute: 0.5 10*3/uL (ref 0.1–1.0)
Monocytes Relative: 7 %
Neutro Abs: 4.3 10*3/uL (ref 1.7–7.7)
Neutrophils Relative %: 66 %
Platelets: 287 10*3/uL (ref 150–400)
RBC: 4.77 MIL/uL (ref 4.22–5.81)
RDW: 13.6 % (ref 11.5–15.5)
WBC: 6.5 10*3/uL (ref 4.0–10.5)
nRBC: 0 % (ref 0.0–0.2)

## 2019-05-20 LAB — ETHANOL: Alcohol, Ethyl (B): 422 mg/dL (ref <10)

## 2019-05-20 MED ORDER — VITAMIN B-1 100 MG PO TABS
100.0000 mg | ORAL_TABLET | Freq: Once | ORAL | Status: AC
  Administered 2019-05-20: 100 mg via ORAL
  Filled 2019-05-20: qty 1

## 2019-05-20 MED ORDER — THIAMINE HCL 100 MG/ML IJ SOLN
Freq: Once | INTRAVENOUS | Status: DC
  Filled 2019-05-20: qty 1000

## 2019-05-20 MED ORDER — FOLIC ACID 1 MG PO TABS
1.0000 mg | ORAL_TABLET | Freq: Once | ORAL | Status: AC
  Administered 2019-05-20: 1 mg via ORAL
  Filled 2019-05-20: qty 1

## 2019-05-20 NOTE — ED Provider Notes (Signed)
Signout from Dr. Vanita Panda.  Brought in by EMS for evaluation of altered mental status.  Likely uncomplicated intoxication. Physical Exam  BP (!) 130/92 (BP Location: Left Arm)   Pulse 85   Temp 98.8 F (37.1 C) (Oral)   Resp 16   SpO2 93%   Physical Exam  ED Course/Procedures   Clinical Course as of May 19 2330  Tue May 20, 2019  1608 Patient's alcohol level is critically high at 422.  Will observe until more sober.   [MB]  8315 Informed by the nurse that the patient is ambulatory and demanded to be discharged.   [MB]  1761 Patient is awake and steady on his feet.  He is on the phone trying to get a ride.  Says if he cannot get a ride he would take a cab.  Although he is quite intoxicated I do not feel I can hold him against his will.    [MB]    Clinical Course User Index [MB] Hayden Rasmussen, MD    Procedures  MDM  Plan is to follow-up on lab work and discharge when more sober.       Hayden Rasmussen, MD 05/20/19 [phone]

## 2019-05-20 NOTE — ED Notes (Signed)
Patient came out of room and announced that he wanted to leave and was going to walk out.  Nurse told him to wait and she would let the doctor know that he wanted to leave.  Patient asked to use the phone walking without difficulty up to nurse's station and speaking in full unslurred sentences.  Patient very irritable at this time.

## 2019-05-20 NOTE — Discharge Instructions (Addendum)
You were seen in the emergency department for alcohol intoxication.  You refused to stay here to sober up.  Please consider detox.  Return if any concerns.

## 2019-05-20 NOTE — ED Notes (Signed)
Pt currently lying down and sleeping. Respirations equal, Will continue to monitor.

## 2019-05-20 NOTE — ED Provider Notes (Signed)
[facility] DEPT Provider Note   CSN: [id number] Arrival date & time: 05/20/19  1315     History   Chief Complaint Chief Complaint  Patient presents with   Alcohol Intoxication    HPI [name] is a 58 y.o. male.     HPI Patient presents from home after his wife called due to apparent intoxication, and hypersomnolence. Patient himself acknowledges drinking alcohol, but is otherwise delirious, disoriented to time, oriented only to self. Level 5 caveat secondary to altered mental status. Patient presents via EMS.  Past Medical History:  Diagnosis Date   COPD (chronic obstructive pulmonary disease) ([place])    Pancreatitis     Patient Active Problem List   Diagnosis Date Noted   Hypertension 11/04/2013   Blurry vision, bilateral 11/04/2013   Pancreatitis 02/09/2012   Alcohol abuse 02/09/2012   TOBACCO ABUSE 10/19/2010   COPD 10/19/2010   PANCREATITIS, ACUTE 10/19/2010    Past Surgical History:  Procedure Laterality Date   NO PAST SURGERIES          Home Medications    Prior to Admission medications   Medication Sig Start Date End Date Taking? Authorizing Provider  aspirin EC 81 MG tablet Take 1 tablet (81 mg total) by mouth daily. Patient not taking: Reported on 10/15/2018 04/06/17   Alfonse Spruce, FNP  atorvastatin (LIPITOR) 40 MG tablet Take 1 tablet (40 mg total) by mouth daily. 09/26/18   Charlott Rakes, MD  Blood Glucose Monitoring Suppl (TRUE METRIX AIR GLUCOSE METER) w/Device KIT 1 kit by Does not apply route 2 (two) times daily at 8 am and 10 pm. 09/17/18   Fulp, Cammie, MD  glucose blood (TRUE METRIX BLOOD GLUCOSE TEST) test strip Use as instructed-fasting before breakfast; 2 hours after largest meal of the day and before bedtime 09/17/18   Fulp, Cammie, MD  metFORMIN (GLUCOPHAGE) 500 MG tablet Take 1 tablet (500 mg total) by mouth 2 (two) times daily with a meal. Needs office visit for more refills.  01/08/19   Fulp, Ander Gaster, MD  TRUEPLUS LANCETS 28G MISC Use when checking blood sugar fasting, at bedtime and about 2 hours after largest meal of the day 09/17/18   Antony Blackbird, MD    Family History Family History  Problem Relation Age of Onset   Diabetes Sister     Social History Social History   Tobacco Use   Smoking status: Current Every Day Smoker    Packs/day: 1.00    Years: 35.00    Pack years: 35.00   Smokeless tobacco: Never Used  Substance Use Topics   Alcohol use: Yes    Comment: occassionally    Drug use: No     Allergies   Patient has no known allergies.   Review of Systems Review of Systems  Unable to perform ROS: Mental status change     Physical Exam Updated Vital Signs BP (!) 130/92 (BP Location: Left Arm)    Pulse 85    Temp 98.8 F (37.1 C) (Oral)    Resp 16    SpO2 93%   Physical Exam Vitals signs and nursing note reviewed.  Constitutional:      General: He is not in acute distress.    Appearance: He is well-developed.     Comments: Chronically ill in appearance thin adult male awake, alert, interactive.  HENT:     Head: Normocephalic and atraumatic.  Eyes:     Conjunctiva/sclera: Conjunctivae normal.  Cardiovascular:  Rate and Rhythm: Normal rate and regular rhythm.  Pulmonary:     Effort: Pulmonary effort is normal. No respiratory distress.     Breath sounds: No stridor.  Abdominal:     General: There is no distension.  Skin:    General: Skin is warm and dry.  Neurological:     Mental Status: He is alert.     Motor: Atrophy present. No tremor.     Comments: Patient speaks spontaneously and to command, is oriented only to self, moves all extremities spontaneously, has no facial asymmetry  Psychiatric:        Cognition and Memory: Cognition is impaired.      ED Treatments / Results  Labs (all labs ordered are listed, but only abnormal results are displayed) Labs Reviewed  COMPREHENSIVE METABOLIC PANEL  CBC WITH  DIFFERENTIAL/PLATELET  ETHANOL    EKG None  Radiology No results found.  Procedures Procedures (including critical care time)  Medications Ordered in ED Medications - No data to display   Initial Impression / Assessment and Plan / ED Course  I have reviewed the triage vital signs and the nursing notes.  Pertinent labs & imaging results that were available during my care of the patient were reviewed by me and considered in my medical decision making (see chart for details).  Adult male presents at the behest of his wife after apparent over ingestion of alcohol. Here the patient is awake and alert, but disoriented. No evidence for trauma, no hemodynamic stability, but with concern for acute alcohol intoxication, patient will require additional monitoring, management.   2:46 PM I discussed the patient's case with his wife. She notes that over the past week, possibly 10 days the patient has been on a bender, drinking up to 2 pints of liquor daily, with occasional cases of beer as well. Patient has reportedly not been eating or drinking appropriately over this period. Today, she states that she stepped out for a few moments, found the patient in a more than intoxicated manner, without evidence for trauma, but with difficult to awaken features, and called EMS.  Patient himself remains sleepy, awakens easily.  This adult male presents after being found hypersomnolent by his wife, with concern for alcohol intoxication.  Here the patient awakens easily, but is delirious. No evidence for trauma, but with concern for acute alcohol intoxication, possible electrolyte abnormalities, labs are ordered, fluids, including banana bag ordered, patient will require additional monitoring, management, repeat assessment. Dr. Melina Copa is aware of the patient.   Final Clinical Impressions(s) / ED Diagnoses  Acute alcohol intoxication   Carmin Muskrat, MD 05/20/19 1448

## 2019-05-20 NOTE — ED Notes (Signed)
Per EDP Vanita Panda, encourage PO fluids and will d/c fluids. Pt given water, tolerated well. Will continue to encourage PO fluid.

## 2019-05-20 NOTE — ED Notes (Signed)
Patient having a difficult time finding a ride home.  Patient waiting for someone to call him back.

## 2019-05-20 NOTE — ED Notes (Signed)
Patient's wife phone number [phone]

## 2019-05-20 NOTE — ED Notes (Signed)
Bed: WA27 Expected date:  Expected time:  Means of arrival:  Comments: EMS 58yo 1L liquor in 1 hour responsive to pain only

## 2019-05-20 NOTE — ED Notes (Signed)
Date and time results received: 05/20/19  (use smartphrase ".now" to insert current time)  Test: ETOH Critical Value: [place]  Name of Provider Notified: Melina Copa MD

## 2019-05-20 NOTE — ED Triage Notes (Signed)
Pt is coming from home. Pt went somewhere this morning and got liquor, beer, and other alcohol and drank it within an hour.  Pt's family reportedly called 59. Pt is not alert to anything but painful stimuli.

## 2019-05-20 NOTE — ED Notes (Signed)
RN attempted to get bloodwork with security at bedside. RN unable to get adequate sample. Will re-try soon

## 2019-07-14 ENCOUNTER — Emergency Department (HOSPITAL_COMMUNITY)
Admission: EM | Admit: 2019-07-14 | Discharge: 2019-07-14 | Disposition: A | Discharge status: Home / Self Care | Payer: Self-pay | Attending: Emergency Medicine | Admitting: Emergency Medicine

## 2019-07-14 ENCOUNTER — Other Ambulatory Visit: Payer: Self-pay

## 2019-07-14 DIAGNOSIS — F10230 Alcohol dependence with withdrawal, uncomplicated: Secondary | ICD-10-CM | POA: Insufficient documentation

## 2019-07-14 DIAGNOSIS — F17200 Nicotine dependence, unspecified, uncomplicated: Secondary | ICD-10-CM | POA: Insufficient documentation

## 2019-07-14 DIAGNOSIS — J449 Chronic obstructive pulmonary disease, unspecified: Secondary | ICD-10-CM | POA: Insufficient documentation

## 2019-07-14 DIAGNOSIS — I10 Essential (primary) hypertension: Secondary | ICD-10-CM | POA: Insufficient documentation

## 2019-07-14 DIAGNOSIS — Z7984 Long term (current) use of oral hypoglycemic drugs: Secondary | ICD-10-CM | POA: Insufficient documentation

## 2019-07-14 DIAGNOSIS — Z7982 Long term (current) use of aspirin: Secondary | ICD-10-CM | POA: Insufficient documentation

## 2019-07-14 LAB — COMPREHENSIVE METABOLIC PANEL
ALT: 129 U/L — ABNORMAL HIGH (ref 0–44)
AST: 98 U/L — ABNORMAL HIGH (ref 15–41)
Albumin: 4.2 g/dL (ref 3.5–5.0)
Alkaline Phosphatase: 92 U/L (ref 38–126)
Anion gap: 12 (ref 5–15)
BUN: 17 mg/dL (ref 6–20)
CO2: 29 mmol/L (ref 22–32)
Calcium: 9.4 mg/dL (ref 8.9–10.3)
Chloride: 91 mmol/L — ABNORMAL LOW (ref 98–111)
Creatinine, Ser: 0.79 mg/dL (ref 0.61–1.24)
GFR calc Af Amer: >60 mL/min (ref >60)
GFR calc non Af Amer: >60 mL/min (ref >60)
Glucose, Bld: 256 mg/dL — ABNORMAL HIGH (ref 70–99)
Potassium: 3.8 mmol/L (ref 3.5–5.1)
Sodium: 132 mmol/L — ABNORMAL LOW (ref 135–145)
Total Bilirubin: 3.2 mg/dL — ABNORMAL HIGH (ref 0.3–1.2)
Total Protein: 7.2 g/dL (ref 6.5–8.1)

## 2019-07-14 LAB — CBC WITH DIFFERENTIAL/PLATELET
Abs Immature Granulocytes: 0.03 10*3/uL (ref 0.00–0.07)
Basophils Absolute: 0 10*3/uL (ref 0.0–0.1)
Basophils Relative: 1 %
Eosinophils Absolute: 0 10*3/uL (ref 0.0–0.5)
Eosinophils Relative: 0 %
HCT: 41.4 % (ref 39.0–52.0)
Hemoglobin: 14.2 g/dL (ref 13.0–17.0)
Immature Granulocytes: 0 %
Lymphocytes Relative: 6 %
Lymphs Abs: 0.4 10*3/uL — ABNORMAL LOW (ref 0.7–4.0)
MCH: 33.8 pg (ref 26.0–34.0)
MCHC: 34.3 g/dL (ref 30.0–36.0)
MCV: 98.6 fL (ref 80.0–100.0)
Monocytes Absolute: 0.7 10*3/uL (ref 0.1–1.0)
Monocytes Relative: 10 %
Neutro Abs: 6.2 10*3/uL (ref 1.7–7.7)
Neutrophils Relative %: 83 %
Platelets: 64 10*3/uL — ABNORMAL LOW (ref 150–400)
RBC: 4.2 MIL/uL — ABNORMAL LOW (ref 4.22–5.81)
RDW: 13.2 % (ref 11.5–15.5)
WBC: 7.5 10*3/uL (ref 4.0–10.5)
nRBC: 0 % (ref 0.0–0.2)

## 2019-07-14 LAB — ETHANOL: Alcohol, Ethyl (B): <10 mg/dL (ref <10)

## 2019-07-14 MED ORDER — LORAZEPAM 1 MG PO TABS: 0.0000 mg | ORAL_TABLET | Freq: Two times a day (BID) | ORAL | Status: DC

## 2019-07-14 MED ORDER — THIAMINE HCL 100 MG/ML IJ SOLN: 100.0000 mg | Freq: Every day | INTRAMUSCULAR | Status: DC

## 2019-07-14 MED ORDER — LORAZEPAM 1 MG PO TABS
0.0000 mg | ORAL_TABLET | Freq: Four times a day (QID) | ORAL | Status: DC
  Administered 2019-07-14: 20:00:00 1 mg via ORAL
  Filled 2019-07-14: qty 1

## 2019-07-14 MED ORDER — LORAZEPAM 2 MG/ML IJ SOLN: 0.0000 mg | Freq: Four times a day (QID) | INTRAMUSCULAR | Status: DC

## 2019-07-14 MED ORDER — VITAMIN B-1 100 MG PO TABS
100.0000 mg | ORAL_TABLET | Freq: Once | ORAL | Status: AC
  Administered 2019-07-14: 20:00:00 100 mg via ORAL
  Filled 2019-07-14: qty 1

## 2019-07-14 MED ORDER — LORAZEPAM 2 MG/ML IJ SOLN: 0.0000 mg | Freq: Two times a day (BID) | INTRAMUSCULAR | Status: DC

## 2019-07-14 MED ORDER — CHLORDIAZEPOXIDE HCL 25 MG PO CAPS: ORAL_CAPSULE | ORAL | 0 refills | Status: DC

## 2019-07-14 NOTE — ED Notes (Signed)
Dr. Ascencion Dike filled out the papers to resend the IVC papers.

## 2019-07-14 NOTE — ED Triage Notes (Signed)
Pt BIB EMS IVC. Pt denies SI and HI. Pt family reports alcohol abuse, failure to thrive, refusing to eat/drink. Pt has tremors at baseline. Pt reports nausea. Pt states his last alcoholic drink was Saturday. Pt is a smoker and has chronic dry cough. Pt reports never having had seizure. Pt diabetic and noncompliant with medication regimen.   HR 110 96% RA  CBG 263

## 2019-07-14 NOTE — ED Notes (Addendum)
Report given to Festus Aloe, RN TCU [phone]

## 2019-07-14 NOTE — ED Provider Notes (Signed)
[facility] DEPT Provider Note   CSN: [id number] Arrival date & time: 07/14/19  1459     History   Chief Complaint Chief Complaint  Patient presents with  . Alcohol Problem    HPI [name] is a 58 y.o. male.     HPI Patient with a history of alcohol abuse states he has been drinking more alcohol over the last week.  He is unsure of the amount of alcohol he has been consuming.  States he has been mostly drinking beer.  Has not had any alcohol since Saturday.  States that yesterday and today has had increased tremors.  He denies auditory or visual hallucinations.  He denies any seizures.  States he has not previously gone through alcohol withdrawal.  Denies suicidal or homicidal ideation. Past Medical History:  Diagnosis Date  . COPD (chronic obstructive pulmonary disease) ([place])   . Pancreatitis     Patient Active Problem List   Diagnosis Date Noted  . Hypertension 11/04/2013  . Blurry vision, bilateral 11/04/2013  . Pancreatitis 02/09/2012  . Alcohol abuse 02/09/2012  . TOBACCO ABUSE 10/19/2010  . COPD 10/19/2010  . PANCREATITIS, ACUTE 10/19/2010    Past Surgical History:  Procedure Laterality Date  . NO PAST SURGERIES          Home Medications    Prior to Admission medications   Medication Sig Start Date End Date Taking? Authorizing Provider  aspirin EC 81 MG tablet Take 1 tablet (81 mg total) by mouth daily. Patient not taking: Reported on 10/15/2018 04/06/17   Alfonse Spruce, FNP  atorvastatin (LIPITOR) 40 MG tablet Take 1 tablet (40 mg total) by mouth daily. 09/26/18   Charlott Rakes, MD  Blood Glucose Monitoring Suppl (TRUE METRIX AIR GLUCOSE METER) w/Device KIT 1 kit by Does not apply route 2 (two) times daily at 8 am and 10 pm. 09/17/18   Fulp, Cammie, MD  chlordiazePOXIDE (LIBRIUM) 25 MG capsule 17m PO TID x 1D, then 25-562mPO BID X 1D, then 25-5011mO QD X 1D 07/14/19   YelJulianne RiceD  glucose blood  (TRUE METRIX BLOOD GLUCOSE TEST) test strip Use as instructed-fasting before breakfast; 2 hours after largest meal of the day and before bedtime 09/17/18   Fulp, Cammie, MD  metFORMIN (GLUCOPHAGE) 500 MG tablet Take 1 tablet (500 mg total) by mouth 2 (two) times daily with a meal. Needs office visit for more refills. 01/08/19   Fulp, CamAnder GasterD  TRUEPLUS LANCETS 28G MISC Use when checking blood sugar fasting, at bedtime and about 2 hours after largest meal of the day 09/17/18   FulAntony BlackbirdD    Family History Family History  Problem Relation Age of Onset  . Diabetes Sister     Social History Social History   Tobacco Use  . Smoking status: Current Every Day Smoker    Packs/day: 1.00    Years: 35.00    Pack years: 35.00  . Smokeless tobacco: Never Used  Substance Use Topics  . Alcohol use: Yes    Comment: occassionally   . Drug use: No     Allergies   Patient has no known allergies.   Review of Systems Review of Systems  Constitutional: Negative for chills, fatigue and fever.  HENT: Negative for sore throat and trouble swallowing.   Eyes: Negative for visual disturbance.  Respiratory: Negative for cough and shortness of breath.   Cardiovascular: Negative for chest pain, palpitations and leg swelling.  Gastrointestinal:  Negative for abdominal pain, constipation, diarrhea, nausea and vomiting.  Genitourinary: Negative for dysuria, flank pain and frequency.  Musculoskeletal: Negative for back pain, myalgias and neck pain.  Skin: Negative for rash and wound.  Neurological: Positive for tremors. Negative for dizziness, syncope, weakness, light-headedness, numbness and headaches.  Psychiatric/Behavioral: Negative for agitation, dysphoric mood and suicidal ideas.  All other systems reviewed and are negative.    Physical Exam Updated Vital Signs BP 130/90   Pulse 90   Temp 98.7 F (37.1 C) (Oral)   Resp 20   Ht _0  (1.854 m)   Wt 77.1 kg   SpO2 99%   BMI 22.43 kg/m    Physical Exam Vitals signs and nursing note reviewed.  Constitutional:      General: He is not in acute distress.    Appearance: Normal appearance. He is well-developed. He is not ill-appearing.  HENT:     Head: Normocephalic and atraumatic.     Nose: Nose normal.     Mouth/Throat:     Mouth: Mucous membranes are moist.  Eyes:     Pupils: Pupils are equal, round, and reactive to light.  Neck:     Musculoskeletal: Normal range of motion and neck supple.  Cardiovascular:     Rate and Rhythm: Regular rhythm. Tachycardia present.     Heart sounds: No murmur. No friction rub. No gallop.   Pulmonary:     Effort: Pulmonary effort is normal. No respiratory distress.     Breath sounds: Normal breath sounds. No stridor. No wheezing, rhonchi or rales.  Chest:     Chest wall: No tenderness.  Abdominal:     General: Bowel sounds are normal.     Palpations: Abdomen is soft.     Tenderness: There is no abdominal tenderness. There is no right CVA tenderness, left CVA tenderness, guarding or rebound.  Musculoskeletal: Normal range of motion.        General: No swelling, tenderness, deformity or signs of injury.     Right lower leg: No edema.     Left lower leg: No edema.  Skin:    General: Skin is warm and dry.     Capillary Refill: Capillary refill takes less than 2 seconds.     Findings: No erythema or rash.  Neurological:     General: No focal deficit present.     Mental Status: He is alert and oriented to person, place, and time.     Comments: Moving all extremities without deficit.  Sensation intact.  Tremor noted.   Psychiatric:        Behavior: Behavior normal.     Comments: No HI/SI.  Does not appear to be responding to internal stimuli.      ED Treatments / Results  Labs (all labs ordered are listed, but only abnormal results are displayed) Labs Reviewed  COMPREHENSIVE METABOLIC PANEL - Abnormal; Notable for the following components:      Result Value   Sodium 132 (*)     Chloride 91 (*)    Glucose, Bld 256 (*)    AST 98 (*)    ALT 129 (*)    Total Bilirubin 3.2 (*)    All other components within normal limits  CBC WITH DIFFERENTIAL/PLATELET - Abnormal; Notable for the following components:   RBC 4.20 (*)    Platelets 64 (*)    Lymphs Abs 0.4 (*)    All other components within normal limits  ETHANOL  RAPID URINE DRUG SCREEN,  HOSP PERFORMED    EKG EKG Interpretation  Date/Time:  Monday July 14 2019 17:59:54 EDT Ventricular Rate:  93 PR Interval:    QRS Duration: 79 QT Interval:  358 QTC Calculation: 446 R Axis:   89 Text Interpretation:  Sinus rhythm Borderline right axis deviation Baseline wander in lead(s) II III aVF V4 Confirmed by Julianne Rice [phone]) on 07/14/2019 9:40:58 PM   Radiology No results found.  Procedures Procedures (including critical care time)  Medications Ordered in ED Medications  LORazepam (ATIVAN) injection 0-4 mg ( Intravenous See Alternative 07/14/19 1947)    Or  LORazepam (ATIVAN) tablet 0-4 mg (1 mg Oral Given 07/14/19 1947)  LORazepam (ATIVAN) injection 0-4 mg (has no administration in time range)    Or  LORazepam (ATIVAN) tablet 0-4 mg (has no administration in time range)  thiamine (B-1) injection 100 mg (100 mg Intravenous Not Given 07/14/19 1951)  thiamine (VITAMIN B-1) tablet 100 mg (100 mg Oral Given 07/14/19 1948)     Initial Impression / Assessment and Plan / ED Course  I have reviewed the triage vital signs and the nursing notes.  Pertinent labs & imaging results that were available during my care of the patient were reviewed by me and considered in my medical decision making (see chart for details).        Evaluated by TTS and recommends outpatient rehab.  Patient given IV fluids and Ativan with improvement of his withdrawal symptoms.  Will give course of Librium.  Patient is in agreement with plan.  Strict return precautions given.  Final Clinical Impressions(s) / ED Diagnoses   Final  diagnoses:  Alcohol dependence with uncomplicated withdrawal [facility])    ED Discharge Orders         Ordered    chlordiazePOXIDE (LIBRIUM) 25 MG capsule     07/14/19 2139           Julianne Rice, MD 07/14/19 2141

## 2019-07-14 NOTE — BH Assessment (Signed)
Tele Assessment Note   Patient Name: [name] MRN: [id number] Referring Physician: Ranae Palms Location of Patient: [place] Location of Provider: Behavioral Health TTS Department  [name] is an 58 y.o. male who presents voluntarily brought by his mom after he lost his appetite for the past few days and had trouble sleeping for 3 days. Pt states that he stopped drinking on Saturday afternoon after drinking 6-8 12 oz beers daily for the past month or so. Pt has a history of cocaine use years ago and went to [facility] to stop, but has no previous IP or OP history. Pt denies depression, Si, HI, AVH. Pt acknowledges symptoms decreased sleep, decreased appetite.  PT describes 0 past suicide attempts, no history of violence. Pt states that onset of symptoms began "maybe last Thursday or Friday or so" when he lost his appetite and could not sleep.  Pt identifies primary stressors as none Pt identifies primary residence as his home, sometimes his GF stays there, sometimes she doesn't. Pt identifies primary supports as his mom, lots of family. Pt states work history includes--works for himself as a Industrial/product designer. Pt identifies legal involvement as none. Pt identifies abuse history as none.  Pt has fair insight and judgment. Pt's memory is typical.?  MSE: Pt is casually dressed, alert, oriented x4 with normal speech and normal motor behavior. Eye contact is good. Pt's mood is depressed and affect is depressed and anxious. Affect is congruent with mood. Thought process is coherent and relevant. There is no indication that pt is currently responding to internal stimuli or experiencing delusional thought content. Pt was cooperative throughout assessment.   Dr. Rod Can, NP recommended peer support consult, outpatient psychiatric treatment. Notified EDP/staff, Dr. Ranae Palms aware.  Diagnosis: Primary Mental Health  atonic type Secondary SUD's  F10.20 Alcohol use disorder  Severe   Past Medical History:  Past Medical History:  Diagnosis Date  . COPD (chronic obstructive pulmonary disease) ([facility])   . Pancreatitis     Past Surgical History:  Procedure Laterality Date  . NO PAST SURGERIES      Family History:  Family History  Problem Relation Age of Onset  . Diabetes Sister     Social History:  reports that he has been smoking. He has a 35.00 pack-year smoking history. He has never used smokeless tobacco. He reports current alcohol use. He reports that he does not use drugs.  Additional Social History:  Alcohol / Drug Use Pain Medications: denies Prescriptions: denies Over the Counter: denies History of alcohol / drug use?: Yes Longest period of sobriety (when/how long): 5-6 days Withdrawal Symptoms: Tremors Substance #1 Name of Substance 1: alcohol 1 - Age of First Use: 16 1 - Amount (size/oz): 6-8 beers 1 - Frequency: daily 1 - Duration: months 1 - Last Use / Amount: Saturday afternoon  CIWA: CIWA-Ar BP: 130/84 Pulse Rate: (!) 113 COWS:    Allergies: No Known Allergies  Home Medications: (Not in a hospital admission)   OB/GYN Status:  No LMP for male patient.  General Assessment Data Location of Assessment: [facility] ED TTS Assessment: In system Is this a Tele or Face-to-Face Assessment?: Tele Assessment Is this an Initial Assessment or a Re-assessment for this encounter?: Initial Assessment Patient Accompanied by:: Parent Language Other than English: No Living Arrangements: (house) What gender do you identify as?: Male Marital status: Long term relationship Living Arrangements: Alone, Spouse/significant other Can pt return to current living arrangement?: Yes Admission Status: Voluntary Is patient capable of signing  voluntary admission?: Yes Referral Source: Self/Family/Friend Insurance type: none     Crisis Care Plan Living Arrangements: Alone, Spouse/significant other Legal Guardian: (self) Name of Psychiatrist: (none  known) Name of Therapist: (none known)  Education Status Is patient currently in school?: No Is the patient employed, unemployed or receiving disability?: Employed  Risk to self with the past 6 months Suicidal Ideation: No Has patient been a risk to self within the past 6 months prior to admission? : No Suicidal Intent: No Has patient had any suicidal intent within the past 6 months prior to admission? : No Is patient at risk for suicide?: No Suicidal Plan?: No-Not Currently/Within Last 6 Months Has patient had any suicidal plan within the past 6 months prior to admission? : No Access to Means: No Previous Attempts/Gestures: No Other Self Harm Risks: (SA) Intentional Self Injurious Behavior: None Family Suicide History: No Recent stressful life event(s): (pt denies) Persecutory voices/beliefs?: No Depression: No Depression Symptoms: Insomnia Substance abuse history and/or treatment for substance abuse?: Yes Suicide prevention information given to non-admitted patients: Not applicable  Risk to Others within the past 6 months Homicidal Ideation: No Does patient have any lifetime risk of violence toward others beyond the six months prior to admission? : No Thoughts of Harm to Others: No Current Homicidal Intent: No Current Homicidal Plan: No Access to Homicidal Means: No History of harm to others?: No Assessment of Violence: None Noted Does patient have access to weapons?: No Criminal Charges Pending?: No Does patient have a court date: No Is patient on probation?: No  Psychosis Hallucinations: None noted Delusions: None noted  Mental Status Report Appearance/Hygiene: Unremarkable Eye Contact: Good Motor Activity: Unremarkable Speech: Logical/coherent Level of Consciousness: Alert Mood: Anxious Affect: Anxious Anxiety Level: Moderate Thought Processes: Coherent, Relevant Judgement: Unimpaired Orientation: Person, Place, Time, Situation, Appropriate for  developmental age Obsessive Compulsive Thoughts/Behaviors: None  Cognitive Functioning Concentration: Normal Memory: Recent Intact, Remote Intact Is patient IDD: No Insight: Good Impulse Control: Good Appetite: Poor Have you had any weight changes? : Loss Amount of the weight change? (lbs): 5 lbs Sleep: Decreased Total Hours of Sleep: (didn't sleep for 3 days, but then slept more than normal) Vegetative Symptoms: None  ADLScreening [facility] Assessment Services) Patient's cognitive ability adequate to safely complete daily activities?: Yes Patient able to express need for assistance with ADLs?: Yes Independently performs ADLs?: Yes (appropriate for developmental age)  Prior Inpatient Therapy Prior Inpatient Therapy: No  Prior Outpatient Therapy Prior Outpatient Therapy: Yes Prior Therapy Dates: years ago Prior Therapy Facilty/Provider(s): NA Reason for Treatment: cocaine use Does patient have an ACCT team?: No Does patient have Intensive In-House Services?  : No Does patient have [facility] services? : No Does patient have P4CC services?: No  ADL Screening (condition at time of admission) Patient's cognitive ability adequate to safely complete daily activities?: Yes Is the patient deaf or have difficulty hearing?: No Does the patient have difficulty seeing, even when wearing glasses/contacts?: No Does the patient have difficulty concentrating, remembering, or making decisions?: No Patient able to express need for assistance with ADLs?: Yes Does the patient have difficulty dressing or bathing?: No Independently performs ADLs?: Yes (appropriate for developmental age) Does the patient have difficulty walking or climbing stairs?: No Weakness of Legs: None Weakness of Arms/Hands: None  Home Assistive Devices/Equipment Home Assistive Devices/Equipment: None  Therapy Consults (therapy consults require a physician order) PT Evaluation Needed: No OT Evalulation Needed: No SLP  Evaluation Needed: No Abuse/Neglect Assessment (Assessment to be complete  while patient is alone) Abuse/Neglect Assessment Can Be Completed: Yes Physical Abuse: Denies Verbal Abuse: Denies Sexual Abuse: Denies Exploitation of patient/patient's resources: Denies Self-Neglect: Denies Values / Beliefs Cultural Requests During Hospitalization: None Spiritual Requests During Hospitalization: None Consults Spiritual Care Consult Needed: No Social Work Consult Needed: No Regulatory affairs officer (For Healthcare) Does Patient Have a Medical Advance Directive?: No Would patient like information on creating a medical advance directive?: No - Patient declined          Disposition:  Disposition Initial Assessment Completed for this Encounter: Yes  This service was provided via telemedicine using a 2-way, interactive audio and video technology.  Names of all persons participating in this telemedicine service and their role in this encounter. Name: Ellouise Newer TTS counselor             [facility] 07/14/2019 6:38 PM

## 2019-07-22 NOTE — Progress Notes (Signed)
Patient ID: [name], male   DOB: January 23, 1961, 58 y.o.   MRN: [id number]  Virtual Visit via Telephone Note  I connected with [name] on 07/23/19 at  3:10 PM EDT by telephone and verified that I am speaking with the correct person using two identifiers.   I discussed the limitations, risks, security and privacy concerns of performing an evaluation and management service by telephone and the availability of in person appointments. I also discussed with the patient that there may be a patient responsible charge related to this service. The patient expressed understanding and agreed to proceed.  Patient location: home My Location:  [facility] office Persons on the call:  Me and the patient  History of Present Illness: After being seen in the ED for alcohol w/d 07/14/2019. Not drinking anymore.  He has a few Librium left.  Last A1C was 10.6 09/2018.  Says his blood sugar at home is in the 160s but doesn't check it often.  Says he is feeling good overall.  Not doing AA/recovery program.  He tolerates metformin 500 bid but has been out of it about 2 weeks.  He doesn't understand why he is on cholesterol meds bc his cholesterol has always been good.     Observations/Objective:  NAD.  A&Ox3   Assessment and Plan: 1. Type 2 diabetes mellitus with hyperglycemia, without long-term current use of insulin (HCC) Increase metformin bc tolerating w/o problems.  Check blood sugars regularly and record - metFORMIN (GLUCOPHAGE) 500 MG tablet; Take 1 tablet (500 mg total) by mouth 2 (two) times daily with a meal. For 1 week then increase dose to 2 tabs twice daily  Dispense: 120 tablet; Refill: 3 - atorvastatin (LIPITOR) 10 MG tablet; Take 1 tablet (10 mg total) by mouth daily.  Dispense: 90 tablet; Refill: 3 - Hemoglobin A1c - Comprehensive metabolic panel  2. Alcohol abuse Gave website [employer] and AA phone number for [employer]. I have counseled the patient at length about substance abuse and  addiction.  12 step meetings/recovery recommended.  Local 12 step meeting lists were given and attendance was encouraged.  Patient expresses understanding.   3.  Hospital follow-up-doing well   Follow Up Instructions: See PCP in 2-3 months   I discussed the assessment and treatment plan with the patient. The patient was provided an opportunity to ask questions and all were answered. The patient agreed with the plan and demonstrated an understanding of the instructions.   The patient was advised to call back or seek an in-person evaluation if the symptoms worsen or if the condition fails to improve as anticipated.  I provided 13 minutes of non-face-to-face time during this encounter.   Freeman Caldron, PA-C

## 2019-07-23 ENCOUNTER — Ambulatory Visit: Payer: Self-pay | Attending: Family Medicine | Admitting: Physician Assistant

## 2019-07-23 ENCOUNTER — Other Ambulatory Visit: Payer: Self-pay

## 2019-07-23 ENCOUNTER — Ambulatory Visit: Payer: Self-pay

## 2019-07-23 DIAGNOSIS — F1010 Alcohol abuse, uncomplicated: Secondary | ICD-10-CM

## 2019-07-23 DIAGNOSIS — E1165 Type 2 diabetes mellitus with hyperglycemia: Secondary | ICD-10-CM

## 2019-07-23 DIAGNOSIS — Z09 Encounter for follow-up examination after completed treatment for conditions other than malignant neoplasm: Secondary | ICD-10-CM

## 2019-07-23 MED ORDER — ATORVASTATIN CALCIUM 10 MG PO TABS: 10.0000 mg | ORAL_TABLET | Freq: Every day | ORAL | 3 refills | Status: DC

## 2019-07-23 MED ORDER — METFORMIN HCL 500 MG PO TABS: 500.0000 mg | ORAL_TABLET | Freq: Two times a day (BID) | ORAL | 3 refills | Status: DC

## 2019-07-23 MED FILL — metFORMIN HCL 500 MG TABS: 500 | 30 days supply | Qty: 120 | Fill #0

## 2019-07-23 MED FILL — ATORVASTATIN 10 MG TABLET: 10 | 30 days supply | Qty: 30 | Fill #0

## 2019-07-24 LAB — HEMOGLOBIN A1C
Est. average glucose Bld gHb Est-mCnc: 212 mg/dL
Hgb A1c MFr Bld: 9 % — ABNORMAL HIGH (ref 4.8–5.6)

## 2019-07-24 LAB — COMPREHENSIVE METABOLIC PANEL
ALT: 46 IU/L — ABNORMAL HIGH (ref 0–44)
AST: 17 IU/L (ref 0–40)
Albumin/Globulin Ratio: 2 (ref 1.2–2.2)
Albumin: 4.4 g/dL (ref 3.8–4.9)
Alkaline Phosphatase: 119 IU/L — ABNORMAL HIGH (ref 39–117)
BUN/Creatinine Ratio: 22 (ref 10–24)
BUN: 13 mg/dL (ref 8–27)
Bilirubin Total: 0.3 mg/dL (ref 0.0–1.2)
CO2: 24 mmol/L (ref 20–29)
Calcium: 9.5 mg/dL (ref 8.6–10.2)
Chloride: 98 mmol/L (ref 96–106)
Creatinine, Ser: 0.58 mg/dL — ABNORMAL LOW (ref 0.76–1.27)
GFR calc Af Amer: 128 mL/min/{1.73_m2} (ref >59)
GFR calc non Af Amer: 111 mL/min/{1.73_m2} (ref >59)
Globulin, Total: 2.2 g/dL (ref 1.5–4.5)
Glucose: 315 mg/dL — ABNORMAL HIGH (ref 65–99)
Potassium: 4.6 mmol/L (ref 3.5–5.2)
Sodium: 136 mmol/L (ref 134–144)
Total Protein: 6.6 g/dL (ref 6.0–8.5)

## 2019-11-17 MED FILL — metFORMIN HCL 500 MG TABS: 500 | 30 days supply | Qty: 120 | Fill #1

## 2020-06-30 ENCOUNTER — Other Ambulatory Visit: Payer: Self-pay | Admitting: Family Medicine

## 2020-06-30 DIAGNOSIS — E1165 Type 2 diabetes mellitus with hyperglycemia: Secondary | ICD-10-CM

## 2020-06-30 MED ORDER — METFORMIN HCL 500 MG PO TABS: 500.0000 mg | ORAL_TABLET | Freq: Two times a day (BID) | ORAL | 0 refills | Status: DC

## 2020-06-30 MED FILL — METFORMIN HCL 500 MG TABS: 500 | 11 days supply | Qty: 30 | Fill #0

## 2020-06-30 NOTE — Telephone Encounter (Signed)
Pt request refill  metFORMIN (GLUCOPHAGE) 500 MG tablet  Pt is completely out of medication. Advised pt he needs appt, as his last was 07/23/2019. Can you send a 30 day Rx to get him to the appt.?  [facility] - [place], [place] - [address] [employer] Phone:  [phone]  Fax:  [phone]

## 2020-07-22 ENCOUNTER — Other Ambulatory Visit: Payer: Self-pay

## 2020-07-22 ENCOUNTER — Ambulatory Visit: Payer: Self-pay | Attending: Physician Assistant | Admitting: Physician Assistant

## 2020-07-22 ENCOUNTER — Encounter: Payer: Self-pay | Admitting: Physician Assistant

## 2020-07-22 VITALS — BP 124/85 | HR 97 | Temp 97.7°F | Ht 73.0 in | Wt 133.0 lb

## 2020-07-22 DIAGNOSIS — E1165 Type 2 diabetes mellitus with hyperglycemia: Secondary | ICD-10-CM

## 2020-07-22 DIAGNOSIS — F1010 Alcohol abuse, uncomplicated: Secondary | ICD-10-CM

## 2020-07-22 DIAGNOSIS — F17200 Nicotine dependence, unspecified, uncomplicated: Secondary | ICD-10-CM

## 2020-07-22 LAB — GLUCOSE, POCT (MANUAL RESULT ENTRY): POC Glucose: 285 mg/dl — AB (ref 70–99)

## 2020-07-22 MED ORDER — METFORMIN HCL 500 MG PO TABS: 1000.0000 mg | ORAL_TABLET | Freq: Two times a day (BID) | ORAL | 3 refills | Status: DC

## 2020-07-22 MED FILL — METFORMIN HCL 500 MG TABS: 500 | 30 days supply | Qty: 120 | Fill #0

## 2020-07-22 NOTE — Progress Notes (Signed)
Davi Kroon, is a 59 y.o. male  ZCH:[id number]  OIN:[id number]  DOB - 10/23/1961  Subjective:  Chief Complaint and HPI: Ed Mandich is a 59 y.o. male here today for med RF.  He was in prison for few months and just got out.  Not drinking alcohol.  Still smoking.  Continued to take metformin 500 bid but was not checking sugars.  Denies any concerns or issues.  No flare ups with pancreatitis.    ROS:   Constitutional:  No f/c, No night sweats, No unexplained weight loss. EENT:  No vision changes, No blurry vision, No hearing changes. No mouth, throat, or ear problems.  Respiratory: No cough, No SOB Cardiac: No CP, no palpitations GI:  No abd pain, No N/V/D. GU: No Urinary s/sx Musculoskeletal: No joint pain Neuro: No headache, no dizziness, no motor weakness.  Skin: No rash Endocrine:  No polydipsia. No polyuria.  Psych: Denies SI/HI  No problems updated.  ALLERGIES: No Known Allergies  PAST MEDICAL HISTORY: Past Medical History:  Diagnosis Date  . COPD (chronic obstructive pulmonary disease) ([place])   . Pancreatitis     MEDICATIONS AT HOME: Prior to Admission medications   Medication Sig Start Date End Date Taking? Authorizing Provider  Blood Glucose Monitoring Suppl (TRUE METRIX AIR GLUCOSE METER) w/Device KIT 1 kit by Does not apply route 2 (two) times daily at 8 am and 10 pm. 09/17/18  Yes Fulp, Cammie, MD  glucose blood (TRUE METRIX BLOOD GLUCOSE TEST) test strip Use as instructed-fasting before breakfast; 2 hours after largest meal of the day and before bedtime 09/17/18  Yes Fulp, Cammie, MD  metFORMIN (GLUCOPHAGE) 500 MG tablet Take 2 tablets (1,000 mg total) by mouth 2 (two) times daily with a meal. For 1 week then increase dose to 2 tabs twice daily 07/22/20  Yes Carder Yin M, PA-C  TRUEPLUS LANCETS 28G MISC Use when checking blood sugar fasting, at bedtime and about 2 hours after largest meal of the day 09/17/18  Yes Fulp, Cammie, MD  aspirin EC 81 MG  tablet Take 1 tablet (81 mg total) by mouth daily. Patient not taking: Reported on 10/15/2018 04/06/17   Alfonse Spruce, FNP  atorvastatin (LIPITOR) 10 MG tablet Take 1 tablet (10 mg total) by mouth daily. Patient not taking: Reported on 07/22/2020 07/23/19   Argentina Donovan, PA-C  chlordiazePOXIDE (LIBRIUM) 25 MG capsule 38m PO TID x 1D, then 25-516mPO BID X 1D, then 25-5059mO QD X 1D Patient not taking: Reported on 07/22/2020 07/14/19   YelJulianne RiceD     Objective:  EXAM:   Vitals:   07/22/20 1001  BP: 124/85  Pulse: 97  Temp: 97.7 F (36.5 C)  TempSrc: Temporal  SpO2: 97%  Weight: 133 lb (60.3 kg)  Height: 6' 1"  (1.854 m)    General appearance : A&OX3. NAD. Non-toxic-appearing; thin HEENT: Atraumatic and Normocephalic.  PERRLA. EOM intact.   Chest/Lungs:  Breathing-non-labored, Good air entry bilaterally, breath sounds normal without rales, rhonchi, or wheezing  CVS: S1 S2 regular, no murmurs, gallops, rubs  Extremities: Bilateral Lower Ext shows no edema, both legs are warm to touch with = pulse throughout Neurology:  CN II-XII grossly intact, Non focal.   Psych:  TP linear. J/I WNL. Normal speech. Appropriate eye contact and affect.  Skin:  No Rash  Data Review Lab Results  Component Value Date   HGBA1C 9.0 (H) 07/23/2019   HGBA1C 10.6 (A) 09/17/2018   HGBA1C 10.6 09/17/2018  HGBA1C 10.6 (A) 09/17/2018   HGBA1C 10.6 (A) 09/17/2018     Assessment & Plan   1. Type 2 diabetes mellitus with hyperglycemia, without long-term current use of insulin (HCC) Uncontrolled.  Increase dose.  He has a glucometer and will check blood sugars fasting and at bedtime and record.   - Glucose (CBG) - Hemoglobin A1c - metFORMIN (GLUCOPHAGE) 500 MG tablet; Take 2 tablets (1,000 mg total) by mouth 2 (two) times daily with a meal. For 1 week then increase dose to 2 tabs twice daily  Dispense: 120 tablet; Refill: 3 - Comprehensive metabolic panel - CBC with  Differential/Platelet - Thyroid Panel With TSH  2. Alcohol abuse I have counseled the patient at length about substance abuse and addiction.  12 step meetings/recovery recommended.  Local 12 step meeting lists were given and attendance was encouraged.  Patient expresses understanding.  -he has not been drinking but just got out of prison - Comprehensive metabolic panel - CBC with Differential/Platelet  3. Smoking-Smoking cessation instruction/counseling given:     Patient have been counseled extensively about nutrition and exercise  Return for 3 weeks televisit with me or Lurena Joiner for DM;  3 months for PCP.  The patient was given clear instructions to go to ER or return to medical center if symptoms don't improve, worsen or new problems develop. The patient verbalized understanding. The patient was told to call to get lab results if they haven't heard anything in the next week.     Freeman Caldron, PA-C [facility] [place] [place]   07/22/2020, 10:23 AMPatient ID: Halford Chessman, male   DOB: [birth year], 59 y.o.   MRN: [id number]

## 2020-07-22 NOTE — Patient Instructions (Addendum)
Check blood sugars fasting and at bedtime and record numbers and have available for next visit.  Drink 80-100ounces water daily.  Avoid alcohol.  NoInsuranceAgent.es is the website for local AA.  Avoid sugar and white carbohydrates   Health Risks of Smoking Smoking cigarettes is very bad for your health. Tobacco smoke has over 200 known poisons in it. It contains the poisonous gases nitrogen oxide and carbon monoxide. There are over 60 chemicals in tobacco smoke that cause cancer. Smoking is difficult to quit because a chemical in tobacco, called nicotine, causes addiction or dependence. When you smoke and inhale, nicotine is absorbed rapidly into the bloodstream through your lungs. Both inhaled and non-inhaled nicotine may be addictive. What are the risks of cigarette smoke? Cigarette smokers have an increased risk of many serious medical problems, including:  Lung cancer.  Lung disease, such as pneumonia, bronchitis, and emphysema.  Chest pain (angina) and heart attack because the heart is not getting enough oxygen.  Heart disease and peripheral blood vessel disease.  High blood pressure (hypertension).  Stroke.  Oral cancer, including cancer of the lip, mouth, or voice box.  Bladder cancer.  Pancreatic cancer.  Cervical cancer.  Pregnancy complications, including premature birth.  Stillbirths and smaller newborn babies, birth defects, and genetic damage to sperm.  Early menopause.  Lower estrogen level for women.  Infertility.  Facial wrinkles.  Blindness.  Increased risk of broken bones (fractures).  Senile dementia.  Stomach ulcers and internal bleeding.  Delayed wound healing and increased risk of complications during surgery.  Even smoking lightly shortens your life expectancy by several years. Because of secondhand smoke exposure, children of smokers have an increased risk of the following:  Sudden infant death syndrome (SIDS).  Respiratory infections.  Lung  cancer.  Heart disease.  Ear infections. What are the benefits of quitting? There are many health benefits of quitting smoking. Here are some of them:  Within days of quitting smoking, your risk of having a heart attack decreases, your blood flow improves, and your lung capacity improves. Blood pressure, pulse rate, and breathing patterns start returning to normal soon after quitting.  Within months, your lungs may clear up completely.  Quitting for 10 years reduces your risk of developing lung cancer and heart disease to almost that of a nonsmoker.  People who quit may see an improvement in their overall quality of life. How do I quit smoking?     Smoking is an addiction with both physical and psychological effects, and longtime habits can be hard to change. Your health care provider can recommend:  Programs and community resources, which may include group support, education, or talk therapy.  Prescription medicines to help reduce cravings.  Nicotine replacement products, such as patches, gum, and nasal sprays. Use these products only as directed. Do not replace cigarette smoking with electronic cigarettes, which are commonly called e-cigarettes. The safety of e-cigarettes is not known, and some may contain harmful chemicals.  A combination of two or more of these methods. Where to find more information  American Lung Association: www.lung.org  American Cancer Society: www.cancer.org Summary  Smoking cigarettes is very bad for your health. Cigarette smokers have an increased risk of many serious medical problems, including several cancers, heart disease, and stroke.  Smoking is an addiction with both physical and psychological effects, and longtime habits can be hard to change.  By stopping right away, you can greatly reduce the risk of medical problems for you and your family.  To  help you quit smoking, your health care provider can recommend programs, community resources,  prescription medicines, and nicotine replacement products such as patches, gum, and nasal sprays. This information is not intended to replace advice given to you by your health care provider. Make sure you discuss any questions you have with your health care provider. Document Revised: 01/31/2018 Document Reviewed: 11/03/2016 Elsevier Patient Education  2020 [employer].

## 2020-07-23 LAB — HEMOGLOBIN A1C
Est. average glucose Bld gHb Est-mCnc: 298 mg/dL
Hgb A1c MFr Bld: 12 % — ABNORMAL HIGH (ref 4.8–5.6)

## 2020-07-23 LAB — COMPREHENSIVE METABOLIC PANEL
ALT: 39 IU/L (ref 0–44)
AST: 21 IU/L (ref 0–40)
Albumin/Globulin Ratio: 2 (ref 1.2–2.2)
Albumin: 4.7 g/dL (ref 3.8–4.8)
Alkaline Phosphatase: 92 IU/L (ref 48–121)
BUN/Creatinine Ratio: 15 (ref 10–24)
BUN: 10 mg/dL (ref 8–27)
Bilirubin Total: 0.8 mg/dL (ref 0.0–1.2)
CO2: 23 mmol/L (ref 20–29)
Calcium: 9.4 mg/dL (ref 8.6–10.2)
Chloride: 97 mmol/L (ref 96–106)
Creatinine, Ser: 0.67 mg/dL — ABNORMAL LOW (ref 0.76–1.27)
GFR calc Af Amer: 120 mL/min/{1.73_m2} (ref >59)
GFR calc non Af Amer: 104 mL/min/{1.73_m2} (ref >59)
Globulin, Total: 2.4 g/dL (ref 1.5–4.5)
Glucose: 238 mg/dL — ABNORMAL HIGH (ref 65–99)
Potassium: 4.5 mmol/L (ref 3.5–5.2)
Sodium: 135 mmol/L (ref 134–144)
Total Protein: 7.1 g/dL (ref 6.0–8.5)

## 2020-07-23 LAB — CBC WITH DIFFERENTIAL/PLATELET
Basophils Absolute: 0.1 10*3/uL (ref 0.0–0.2)
Basos: 1 %
EOS (ABSOLUTE): 0.2 10*3/uL (ref 0.0–0.4)
Eos: 2 %
Hematocrit: 47.1 % (ref 37.5–51.0)
Hemoglobin: 15.7 g/dL (ref 13.0–17.7)
Immature Grans (Abs): 0 10*3/uL (ref 0.0–0.1)
Immature Granulocytes: 0 %
Lymphocytes Absolute: 2.2 10*3/uL (ref 0.7–3.1)
Lymphs: 23 %
MCH: 32.8 pg (ref 26.6–33.0)
MCHC: 33.3 g/dL (ref 31.5–35.7)
MCV: 98 fL — ABNORMAL HIGH (ref 79–97)
Monocytes Absolute: 0.8 10*3/uL (ref 0.1–0.9)
Monocytes: 9 %
Neutrophils Absolute: 6.1 10*3/uL (ref 1.4–7.0)
Neutrophils: 65 %
Platelets: 224 10*3/uL (ref 150–450)
RBC: 4.79 x10E6/uL (ref 4.14–5.80)
RDW: 11.8 % (ref 11.6–15.4)
WBC: 9.4 10*3/uL (ref 3.4–10.8)

## 2020-07-23 LAB — THYROID PANEL WITH TSH
Free Thyroxine Index: 2 (ref 1.2–4.9)
T3 Uptake Ratio: 33 % (ref 24–39)
T4, Total: 6.2 ug/dL (ref 4.5–12.0)
TSH: 0.396 u[IU]/mL — ABNORMAL LOW (ref 0.450–4.500)

## 2020-08-12 ENCOUNTER — Ambulatory Visit: Payer: Self-pay | Attending: Family Medicine | Admitting: Pharmacist

## 2020-08-13 IMAGING — CT CT ABD-PELV W/ CM
2 of 5 series · 15 of 46 positions shown, 17 images · IV contrast (Omni 300)
Comparison: 07/06/2011

CLINICAL DATA: Acute right lower quadrant pain, remote history of
alcohol abuse pancreatitis

EXAM:
CT ABDOMEN AND PELVIS WITH CONTRAST
TECHNIQUE: Multidetector CT imaging of the abdomen and pelvis was performed
using the standard protocol following bolus administration of
intravenous contrast.
CONTRAST:  100mL OMNIPAQUE IOHEXOL 300 MG/ML  SOLN

[Series 3: a/p w/ 5mm · axial · 0.79mm/px · z∈[+856,+1206]mm · 12 of 80 slices shown, 14 images]
[im 5/80  soft-tissue]
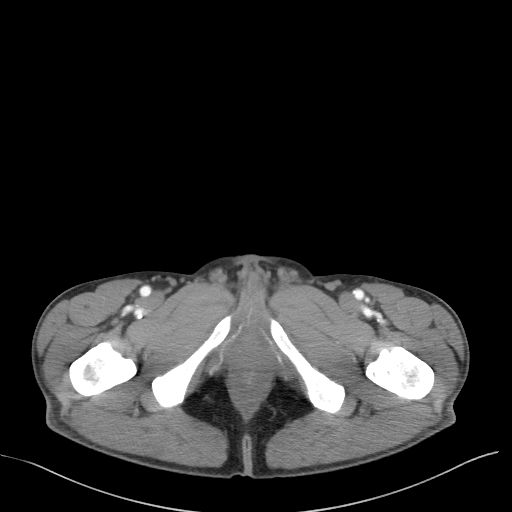
[im 5/80  bone]
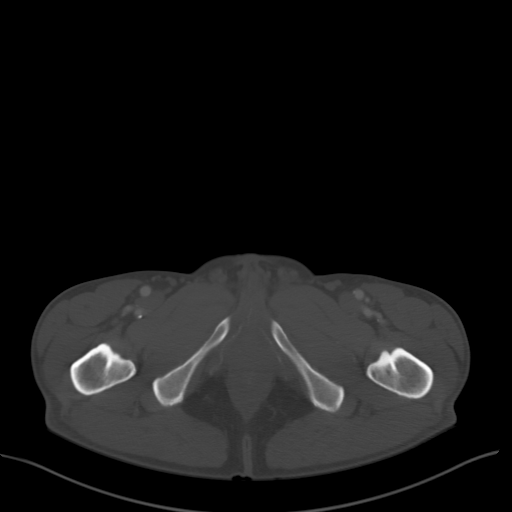
[im 14/80  soft-tissue]
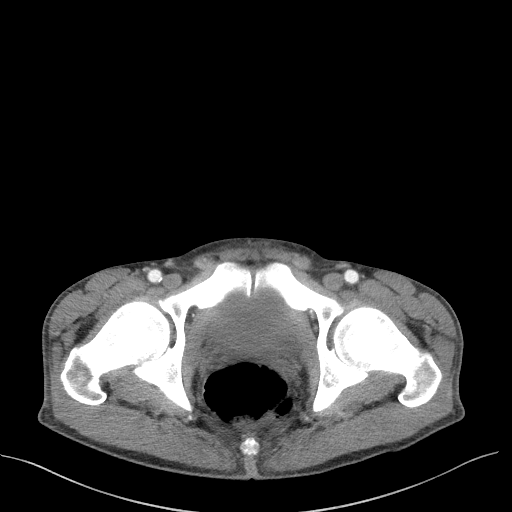
[im 19/80  soft-tissue]
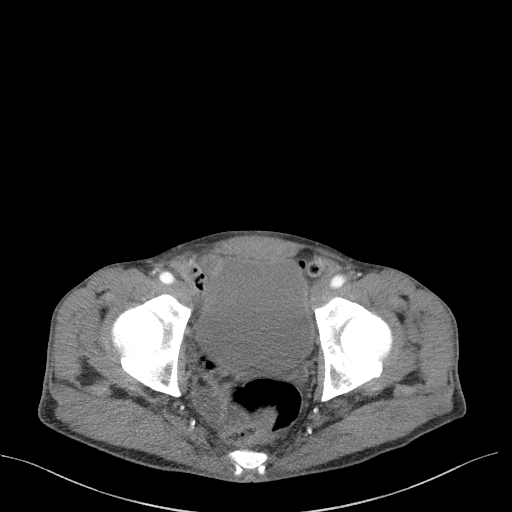
[im 24/80  soft-tissue]
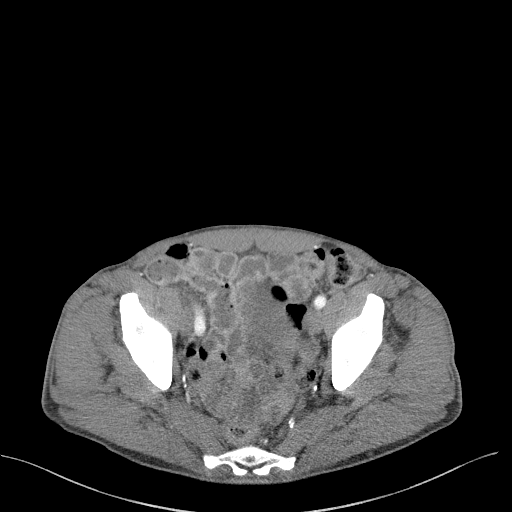
[im 33/80  soft-tissue]
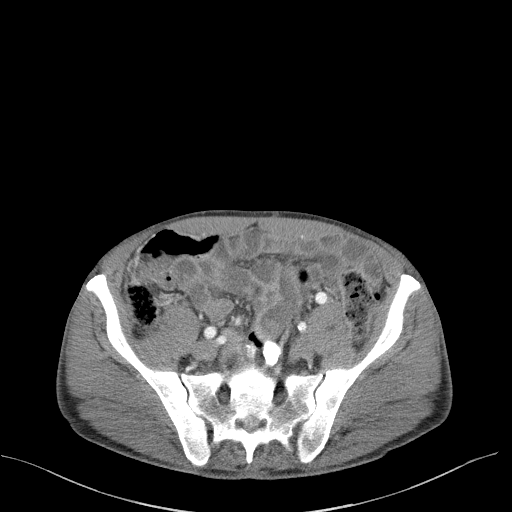
[im 38/80  soft-tissue]
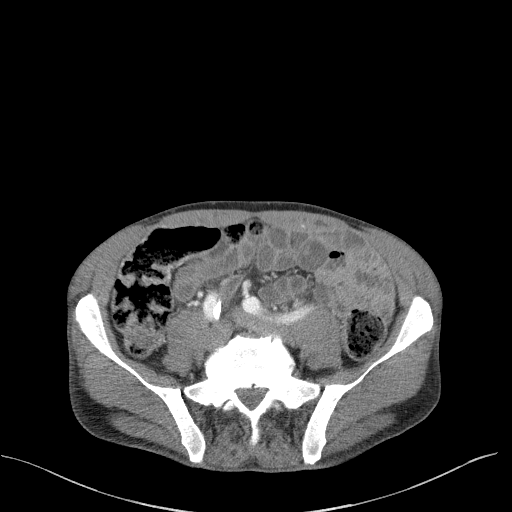
[im 42/80  soft-tissue]
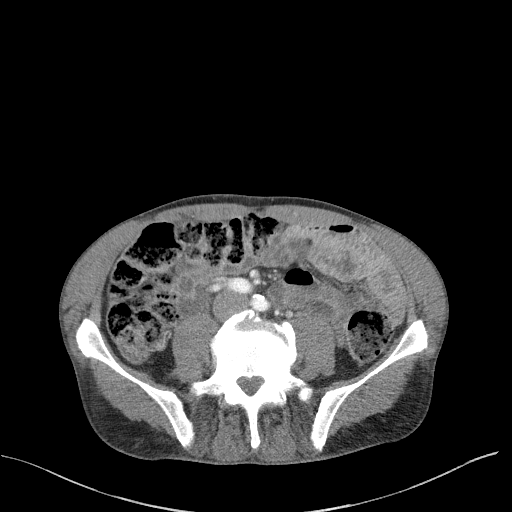
[im 52/80  soft-tissue]
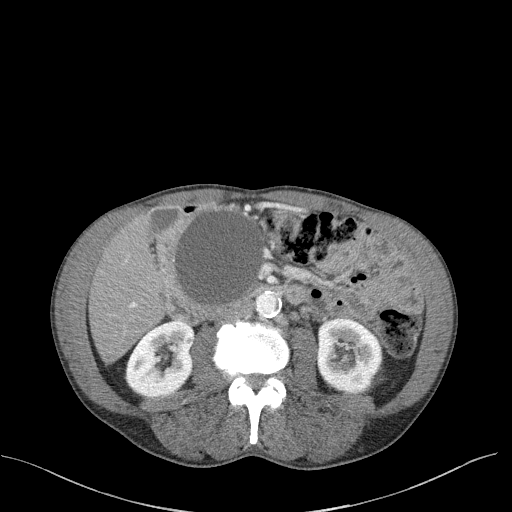
[im 56/80  soft-tissue]
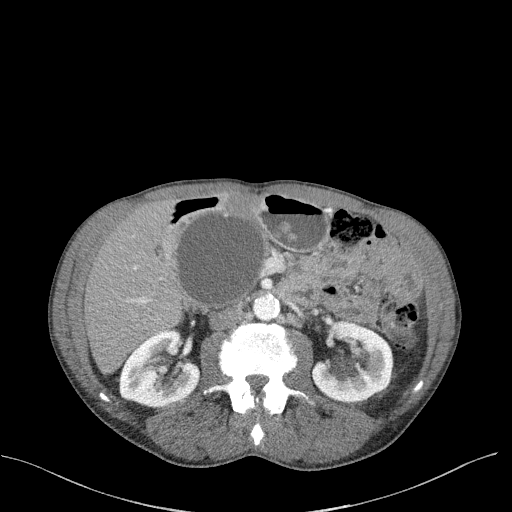
[im 56/80  bone]
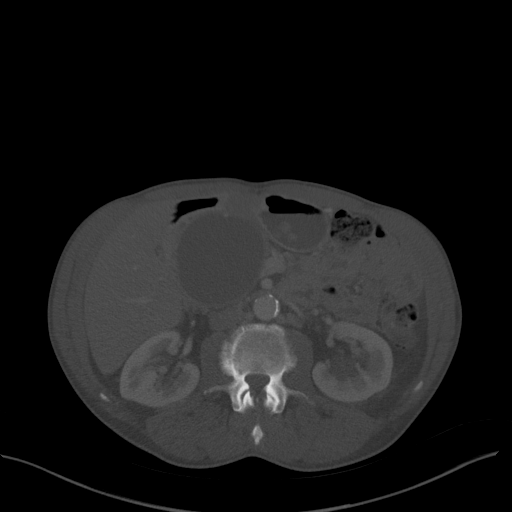
[im 61/80  soft-tissue]
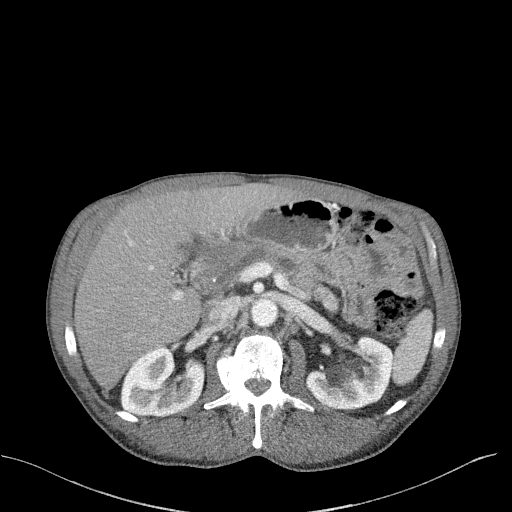
[im 70/80  soft-tissue]
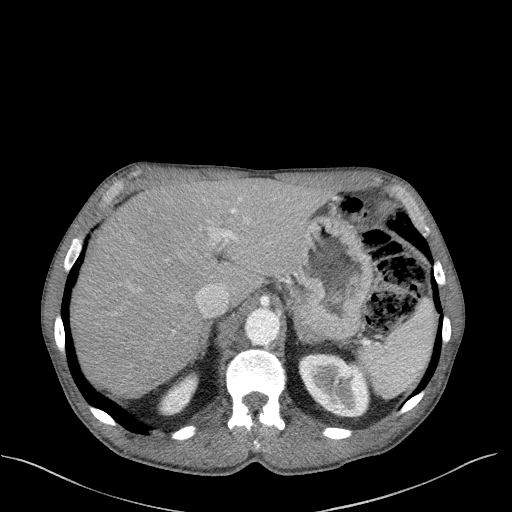
[im 75/80  soft-tissue]
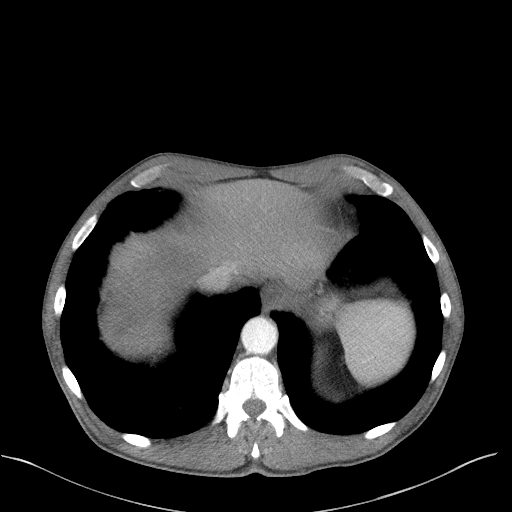

[Series 6: a/p w/ cor · coronal · 0.63mm/px · 3 of 130 slices shown]
[im 44/130  soft-tissue]
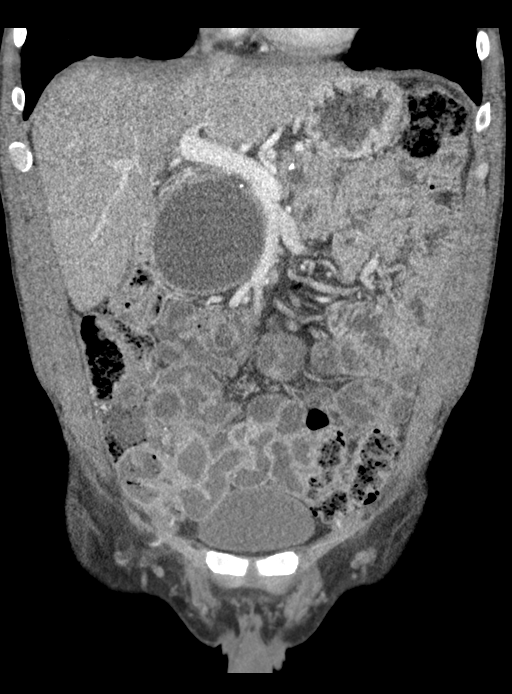
[im 58/130  soft-tissue]
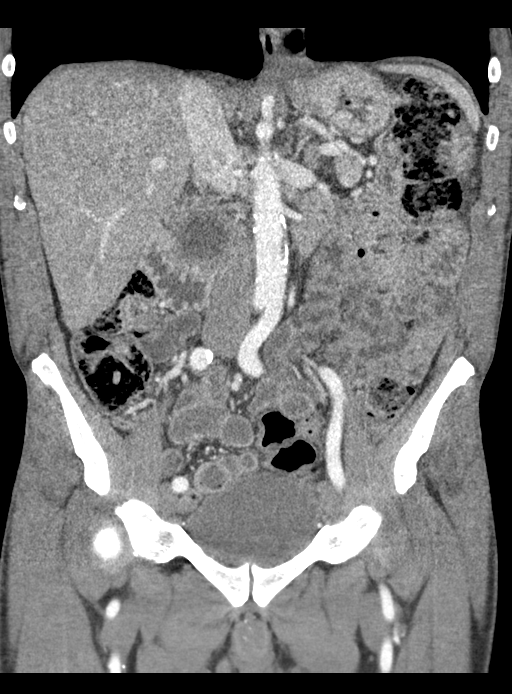
[im 72/130  soft-tissue]
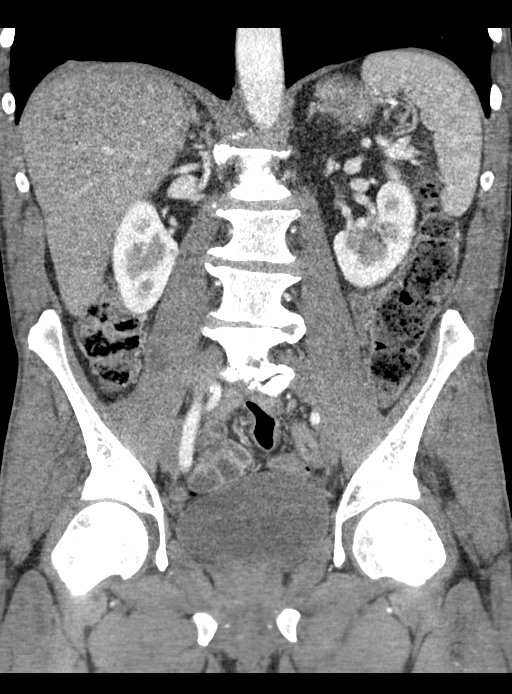

[15 of 46 positions shown; findings below may reference images not displayed]

FINDINGS: Lower chest: Minor dependent basilar atelectasis. No lower lobe
pneumonia. Normal heart size. No pericardial or pleural effusion.
Negative for hiatal hernia.

Hepatobiliary: Diffuse hepatic hypoattenuation compatible with
hepatic steatosis but improved compared 1471. Hepatic and portal
veins remain patent. Gallbladder nondistended. Common bile duct
nondilated.

Pancreas: Pancreatic atrophy noted with diffuse pancreatic duct
dilatation and scattered parenchymal calcifications, compatible with
chronic pancreatitis. No superimposed acute inflammatory process.
Centered in the pancreatic head region, there is a large cystic
lesion measuring 7.5 x 7.1 cm, image 26 series 3 most compatible
with a chronic pancreatic head pseudocyst. No signs of
superinfection or abscess. Visceral arterial and venous vasculature
appear patent. No associated vascular complication related to the
chronic pancreatitis.

Spleen: Normal in size without focal abnormality.

Adrenals/Urinary Tract: Normal adrenal glands. Central renal small
parapelvic cysts bilaterally. No renal obstruction or
hydronephrosis. No hydroureter or ureteral calculus. Bladder
unremarkable.

Stomach/Bowel: Negative for bowel obstruction, significant
dilatation, ileus, free air.

Vascular/Lymphatic: Aorta is atherosclerotic without occlusive
process, aneurysm or dissection. Iliac vessels are tortuous but
remain patent. Mesenteric and renal vasculature remain patent. No
adenopathy.

Reproductive: Unremarkable by CT

Other: No abdominal wall hernia or abnormality. No abdominopelvic
ascites.

Musculoskeletal: Degenerative changes of the spine. Acute osseous
finding.
IMPRESSION: 7.5 cm well-circumscribed cystic lesion centered in the pancreatic
head, favored to represent a large chronic pseudocyst with
associated chronic pancreatitis changes as detailed above.

Mild hepatic steatosis

Abdominal atherosclerosis

## 2020-10-19 ENCOUNTER — Other Ambulatory Visit: Payer: Self-pay | Admitting: Physician Assistant

## 2020-10-19 ENCOUNTER — Other Ambulatory Visit: Payer: Self-pay | Admitting: Family Medicine

## 2020-10-19 DIAGNOSIS — E1165 Type 2 diabetes mellitus with hyperglycemia: Secondary | ICD-10-CM

## 2020-10-19 MED ORDER — METFORMIN HCL 500 MG PO TABS: 1000.0000 mg | ORAL_TABLET | Freq: Two times a day (BID) | ORAL | 0 refills | Status: DC

## 2020-10-19 NOTE — Telephone Encounter (Signed)
Medication Refill - Medication: metFORMIN (GLUCOPHAGE) 500 MG tablet     Preferred Pharmacy (with phone number or street name):  [facility] - [place], [place] - [address] Phone:  [phone]  Fax:  [phone]       Agent: Please be advised that RX refills may take up to 3 business days. We ask that you follow-up with your pharmacy.

## 2020-10-20 MED FILL — METFORMIN HCL 500 MG TABS: 500 | 30 days supply | Qty: 120 | Fill #0

## 2020-10-25 ENCOUNTER — Ambulatory Visit: Payer: Self-pay | Admitting: Family

## 2020-10-25 ENCOUNTER — Ambulatory Visit: Payer: Self-pay | Admitting: Family Medicine

## 2020-12-09 ENCOUNTER — Other Ambulatory Visit: Payer: Self-pay

## 2020-12-09 ENCOUNTER — Other Ambulatory Visit: Payer: Self-pay | Admitting: Internal Medicine

## 2020-12-09 ENCOUNTER — Ambulatory Visit: Payer: Self-pay | Attending: Internal Medicine | Admitting: Internal Medicine

## 2020-12-09 ENCOUNTER — Encounter: Payer: Self-pay | Admitting: Internal Medicine

## 2020-12-09 VITALS — BP 132/74 | HR 84 | Temp 98.7°F | Ht 72.0 in | Wt 139.0 lb

## 2020-12-09 DIAGNOSIS — Z9229 Personal history of other drug therapy: Secondary | ICD-10-CM | POA: Insufficient documentation

## 2020-12-09 DIAGNOSIS — Z789 Other specified health status: Secondary | ICD-10-CM | POA: Insufficient documentation

## 2020-12-09 DIAGNOSIS — Z1211 Encounter for screening for malignant neoplasm of colon: Secondary | ICD-10-CM

## 2020-12-09 DIAGNOSIS — Z2821 Immunization not carried out because of patient refusal: Secondary | ICD-10-CM | POA: Insufficient documentation

## 2020-12-09 DIAGNOSIS — F17200 Nicotine dependence, unspecified, uncomplicated: Secondary | ICD-10-CM

## 2020-12-09 DIAGNOSIS — E1165 Type 2 diabetes mellitus with hyperglycemia: Secondary | ICD-10-CM

## 2020-12-09 HISTORY — DX: Personal history of other drug therapy: Z92.29

## 2020-12-09 LAB — POCT GLYCOSYLATED HEMOGLOBIN (HGB A1C): Hemoglobin A1C: 12 % — AB (ref 4.0–5.6)

## 2020-12-09 LAB — GLUCOSE, POCT (MANUAL RESULT ENTRY): POC Glucose: 198 mg/dl — AB (ref 70–99)

## 2020-12-09 MED ORDER — METFORMIN HCL 1000 MG PO TABS: 1000.0000 mg | ORAL_TABLET | Freq: Two times a day (BID) | ORAL | 6 refills | Status: DC

## 2020-12-09 MED ORDER — GLIMEPIRIDE 2 MG PO TABS: 2.0000 mg | ORAL_TABLET | Freq: Every day | ORAL | 3 refills | Status: DC

## 2020-12-09 MED ORDER — ATORVASTATIN CALCIUM 10 MG PO TABS: 10.0000 mg | ORAL_TABLET | Freq: Every day | ORAL | 3 refills | Status: DC

## 2020-12-09 NOTE — Patient Instructions (Signed)
Diabetes Mellitus and Nutrition, Adult When you have diabetes, or diabetes mellitus, it is very important to have healthy eating habits because your blood sugar (glucose) levels are greatly affected by what you eat and drink. Eating healthy foods in the right amounts, at about the same times every day, can help you:  Control your blood glucose.  Lower your risk of heart disease.  Improve your blood pressure.  Reach or maintain a healthy weight. What can affect my meal plan? Every person with diabetes is different, and each person has different needs for a meal plan. Your health care provider may recommend that you work with a dietitian to make a meal plan that is best for you. Your meal plan may vary depending on factors such as:  The calories you need.  The medicines you take.  Your weight.  Your blood glucose, blood pressure, and cholesterol levels.  Your activity level.  Other health conditions you have, such as heart or kidney disease. How do carbohydrates affect me? Carbohydrates, also called carbs, affect your blood glucose level more than any other type of food. Eating carbs naturally raises the amount of glucose in your blood. Carb counting is a method for keeping track of how many carbs you eat. Counting carbs is important to keep your blood glucose at a healthy level, especially if you use insulin or take certain oral diabetes medicines. It is important to know how many carbs you can safely have in each meal. This is different for every person. Your dietitian can help you calculate how many carbs you should have at each meal and for each snack. How does alcohol affect me? Alcohol can cause a sudden decrease in blood glucose (hypoglycemia), especially if you use insulin or take certain oral diabetes medicines. Hypoglycemia can be a life-threatening condition. Symptoms of hypoglycemia, such as sleepiness, dizziness, and confusion, are similar to symptoms of having too much  alcohol.  Do not drink alcohol if: ? Your health care provider tells you not to drink. ? You are pregnant, may be pregnant, or are planning to become pregnant.  If you drink alcohol: ? Do not drink on an empty stomach. ? Limit how much you use to:  0-1 drink a day for women.  0-2 drinks a day for men. ? Be aware of how much alcohol is in your drink. In the [place] one drink equals one 12 oz bottle of beer (355 mL), one 5 oz glass of wine (148 mL), or one 1 oz glass of hard liquor (44 mL). ? Keep yourself hydrated with water, diet soda, or unsweetened iced tea.  Keep in mind that regular soda, juice, and other mixers may contain a lot of sugar and must be counted as carbs. What are tips for following this plan? Reading food labels  Start by checking the serving size on the "Nutrition Facts" label of packaged foods and drinks. The amount of calories, carbs, fats, and other nutrients listed on the label is based on one serving of the item. Many items contain more than one serving per package.  Check the total grams (g) of carbs in one serving. You can calculate the number of servings of carbs in one serving by dividing the total carbs by 15. For example, if a food has 30 g of total carbs per serving, it would be equal to 2 servings of carbs.  Check the number of grams (g) of saturated fats and trans fats in one serving. Choose foods that have   a low amount or none of these fats.  Check the number of milligrams (mg) of salt (sodium) in one serving. Most people should limit total sodium intake to less than 2,300 mg per day.  Always check the nutrition information of foods labeled as "low-fat" or "nonfat." These foods may be higher in added sugar or refined carbs and should be avoided.  Talk to your dietitian to identify your daily goals for nutrients listed on the label. Shopping  Avoid buying canned, pre-made, or processed foods. These foods tend to be high in fat, sodium, and added  sugar.  Shop around the outside edge of the grocery store. This is where you will most often find fresh fruits and vegetables, bulk grains, fresh meats, and fresh dairy. Cooking  Use low-heat cooking methods, such as baking, instead of high-heat cooking methods like deep frying.  Cook using healthy oils, such as olive, canola, or sunflower oil.  Avoid cooking with butter, cream, or high-fat meats. Meal planning  Eat meals and snacks regularly, preferably at the same times every day. Avoid going long periods of time without eating.  Eat foods that are high in fiber, such as fresh fruits, vegetables, beans, and whole grains. Talk with your dietitian about how many servings of carbs you can eat at each meal.  Eat 4-6 oz (112-168 g) of lean protein each day, such as lean meat, chicken, fish, eggs, or tofu. One ounce (oz) of lean protein is equal to: ? 1 oz (28 g) of meat, chicken, or fish. ? 1 egg. ?  cup (62 g) of tofu.  Eat some foods each day that contain healthy fats, such as avocado, nuts, seeds, and fish.   What foods should I eat? Fruits Berries. Apples. Oranges. Peaches. Apricots. Plums. Grapes. Mango. Papaya. Pomegranate. Kiwi. Cherries. Vegetables Lettuce. Spinach. Leafy greens, including kale, chard, collard greens, and mustard greens. Beets. Cauliflower. Cabbage. Broccoli. Carrots. Green beans. Tomatoes. Peppers. Onions. Cucumbers. Brussels sprouts. Grains Whole grains, such as whole-wheat or whole-grain bread, crackers, tortillas, cereal, and pasta. Unsweetened oatmeal. Quinoa. Brown or wild rice. Meats and other proteins Seafood. Poultry without skin. Lean cuts of poultry and beef. Tofu. Nuts. Seeds. Dairy Low-fat or fat-free dairy products such as milk, yogurt, and cheese. The items listed above may not be a complete list of foods and beverages you can eat. Contact a dietitian for more information. What foods should I avoid? Fruits Fruits canned with  syrup. Vegetables Canned vegetables. Frozen vegetables with butter or cream sauce. Grains Refined white flour and flour products such as bread, pasta, snack foods, and cereals. Avoid all processed foods. Meats and other proteins Fatty cuts of meat. Poultry with skin. Breaded or fried meats. Processed meat. Avoid saturated fats. Dairy Full-fat yogurt, cheese, or milk. Beverages Sweetened drinks, such as soda or iced tea. The items listed above may not be a complete list of foods and beverages you should avoid. Contact a dietitian for more information. Questions to ask a health care provider  Do I need to meet with a diabetes educator?  Do I need to meet with a dietitian?  What number can I call if I have questions?  When are the best times to check my blood glucose? Where to find more information:  American Diabetes Association: diabetes.org  Academy of Nutrition and Dietetics: www.eatright.org  National Institute of Diabetes and Digestive and Kidney Diseases: www.niddk.nih.gov  Association of Diabetes Care and Education Specialists: www.diabeteseducator.org Summary  It is important to have healthy eating   habits because your blood sugar (glucose) levels are greatly affected by what you eat and drink.  A healthy meal plan will help you control your blood glucose and maintain a healthy lifestyle.  Your health care provider may recommend that you work with a dietitian to make a meal plan that is best for you.  Keep in mind that carbohydrates (carbs) and alcohol have immediate effects on your blood glucose levels. It is important to count carbs and to use alcohol carefully. This information is not intended to replace advice given to you by your health care provider. Make sure you discuss any questions you have with your health care provider. Document Revised: 10/07/2019 Document Reviewed: 10/07/2019 Elsevier Patient Education  2021 [employer]  

## 2020-12-09 NOTE — Progress Notes (Signed)
Patient ID: [name], male    DOB: 09/15/1961  MRN: [id number]  CC: Follow-up (Patient is here for 3 months follow up and re-establish care. )   Subjective: [name] is a 60 y.o. male who presents for chronic disease management.  PCP was Dr. Chapman Fitch who is no longer with the practice. His concerns today include:  Patient with history of, diabetes, pancreatitis, ETOH abuse, tob dep COPD  DM:  Does not check BS regularly.  Does not like having sore finger tips On Metformin but admits that he sometimes forgets to take the evening dose at least 2-3 times a week. He feels he does okay with eating habits.  He eats wheat bread instead of white bread, eats fried food only occasionally.  However he drinks a lot of Gatorade but states that he mixes it with water.  Tob dep:  1pk a day since age 89. Tried to quit 1-2 x in past.  Longest was for 2 wks. Not ready to quit  ETOH:  Drinks beer 1-2 x a wk.  In one sitting he drinks 4-6  12oz cans.  I note that he has had some weight loss since 2020.  He attributes this to being incarcerated most of last year.  States that he was not eating much at the time because the food was not great.  He was just released when he was seen here back in September of last year.  Since then he has gained back 9 pounds.  He denies any blood in the stools or urine.  No chronic cough.  HM: Completed Pfizer booster but does not have his card with him.  He declines the flu shot..   Patient Active Problem List   Diagnosis Date Noted  . Hypertension 11/04/2013  . Blurry vision, bilateral 11/04/2013  . Pancreatitis 02/09/2012  . Alcohol abuse 02/09/2012  . TOBACCO ABUSE 10/19/2010  . COPD 10/19/2010  . PANCREATITIS, ACUTE 10/19/2010     Current Outpatient Medications on File Prior to Visit  Medication Sig Dispense Refill  . Blood Glucose Monitoring Suppl (TRUE METRIX AIR GLUCOSE METER) w/Device KIT 1 kit by Does not apply route 2 (two) times daily at 8 am  and 10 pm. 1 kit 0  . glucose blood (TRUE METRIX BLOOD GLUCOSE TEST) test strip Use as instructed-fasting before breakfast; 2 hours after largest meal of the day and before bedtime 100 each 12  . metFORMIN (GLUCOPHAGE) 500 MG tablet Take 2 tablets (1,000 mg total) by mouth 2 (two) times daily with a meal. For 1 week then increase dose to 2 tabs twice daily 120 tablet 0  . TRUEPLUS LANCETS 28G MISC Use when checking blood sugar fasting, at bedtime and about 2 hours after largest meal of the day 100 each 6  . aspirin EC 81 MG tablet Take 1 tablet (81 mg total) by mouth daily. (Patient not taking: No sig reported) 90 tablet 3  . atorvastatin (LIPITOR) 10 MG tablet Take 1 tablet (10 mg total) by mouth daily. (Patient not taking: No sig reported) 90 tablet 3  . chlordiazePOXIDE (LIBRIUM) 25 MG capsule 60m PO TID x 1D, then 25-564mPO BID X 1D, then 25-509mO QD X 1D (Patient not taking: No sig reported) 10 capsule 0   No current facility-administered medications on file prior to visit.    No Known Allergies  Social History   Socioeconomic History  . Marital status: Single    Spouse name: Not on file  .  Number of children: Not on file  . Years of education: Not on file  . Highest education level: Not on file  Occupational History  . Not on file  Tobacco Use  . Smoking status: Current Every Day Smoker    Packs/day: 1.00    Years: 35.00    Pack years: 35.00  . Smokeless tobacco: Never Used  Substance and Sexual Activity  . Alcohol use: Yes    Comment: occassionally   . Drug use: No  . Sexual activity: Not Currently  Other Topics Concern  . Not on file  Social History Narrative  . Not on file   Social Determinants of Health   Financial Resource Strain: Not on file  Food Insecurity: Not on file  Transportation Needs: Not on file  Physical Activity: Not on file  Stress: Not on file  Social Connections: Not on file  Intimate Partner Violence: Not on file    Family History   Problem Relation Age of Onset  . Diabetes Sister     Past Surgical History:  Procedure Laterality Date  . NO PAST SURGERIES      ROS: Review of Systems Negative except as stated above  PHYSICAL EXAM: BP 132/74 (BP Location: Left Arm, Patient Position: Sitting, Cuff Size: Normal)   Pulse 84   Temp 98.7 F (37.1 C) (Oral)   Ht 6' (1.829 m)   Wt 139 lb (63 kg)   SpO2 97%   BMI 18.85 kg/m   Wt Readings from Last 3 Encounters:  12/09/20 139 lb (63 kg)  07/22/20 133 lb (60.3 kg)  07/14/19 170 lb (77.1 kg)    Physical Exam  General appearance - alert, well appearing, and in no distress.  Slight temporal wasting Mental status - normal mood, behavior, speech, dress, motor activity, and thought processes Neck - supple, no significant adenopathy Chest - clear to auscultation, no wheezes, rales or rhonchi, symmetric air entry Heart - normal rate, regular rhythm, normal S1, S2, no murmurs, rubs, clicks or gallops Extremities - peripheral pulses normal, no pedal edema, no clubbing or cyanosis  CMP Latest Ref Rng & Units 07/22/2020 07/23/2019 07/14/2019  Glucose 65 - 99 mg/dL 238(H) 315(H) 256(H)  BUN 8 - 27 mg/dL 10 13 17   Creatinine 0.76 - 1.27 mg/dL 0.67(L) 0.58(L) 0.79  Sodium 134 - 144 mmol/L 135 136 132(L)  Potassium 3.5 - 5.2 mmol/L 4.5 4.6 3.8  Chloride 96 - 106 mmol/L 97 98 91(L)  CO2 20 - 29 mmol/L 23 24 29   Calcium 8.6 - 10.2 mg/dL 9.4 9.5 9.4  Total Protein 6.0 - 8.5 g/dL 7.1 6.6 7.2  Total Bilirubin 0.0 - 1.2 mg/dL 0.8 0.3 3.2(H)  Alkaline Phos 48 - 121 IU/L 92 119(H) 92  AST 0 - 40 IU/L 21 17 98(H)  ALT 0 - 44 IU/L 39 46(H) 129(H)   Lipid Panel     Component Value Date/Time   CHOL 201 (H) 09/26/2018 1132   TRIG 62 09/26/2018 1132   HDL 88 09/26/2018 1132   CHOLHDL 2.3 09/26/2018 1132   CHOLHDL 6.0 07/09/2011 0600   VLDL 27 07/09/2011 0600   LDLCALC 101 (H) 09/26/2018 1132    CBC    Component Value Date/Time   WBC 9.4 07/22/2020 1030   WBC 7.5  07/14/2019 1759   RBC 4.79 07/22/2020 1030   RBC 4.20 (L) 07/14/2019 1759   HGB 15.7 07/22/2020 1030   HCT 47.1 07/22/2020 1030   PLT 224 07/22/2020 1030   MCV  98 (H) 07/22/2020 1030   MCH 32.8 07/22/2020 1030   MCH 33.8 07/14/2019 1759   MCHC 33.3 07/22/2020 1030   MCHC 34.3 07/14/2019 1759   RDW 11.8 07/22/2020 1030   LYMPHSABS 2.2 07/22/2020 1030   MONOABS 0.7 07/14/2019 1759   EOSABS 0.2 07/22/2020 1030   BASOSABS 0.1 07/22/2020 1030   Results for orders placed or performed in visit on 12/09/20  Glucose (CBG)  Result Value Ref Range   POC Glucose 198 (A) 70 - 99 mg/dl  HgB A1c  Result Value Ref Range   Hemoglobin A1C 12.0 (A) 4.0 - 5.6 %   HbA1c POC (<> result, manual entry)     HbA1c, POC (prediabetic range)     HbA1c, POC (controlled diabetic range)      ASSESSMENT AND PLAN: 1. Type 2 diabetes mellitus with hyperglycemia, without long-term current use of insulin (HCC) Not at goal.  Encouraged him to take the Metformin twice a day.  I recommend adding daily dose of Lantus insulin at bedtime to get his diabetes under better control.  Discussed health risks associated with uncontrolled diabetes.  Patient is reluctant to be placed on insulin but states that he will think about it and get back to me in few days.  In the meantime he is agreeable for [place] to add another oral medication in the form of Amaryl.  Advised to check blood sugars at least once daily before a meal with goal being 90-130.  I went over signs and symptoms of hypoglycemia and how to treat.  Advised to avoid drinking alcoholic beverages on on an empty stomach to prevent hypoglycemia. - Glucose (CBG) - HgB A1c - Lipid panel; Future - metFORMIN (GLUCOPHAGE) 1000 MG tablet; Take 1 tablet (1,000 mg total) by mouth 2 (two) times daily with a meal.  Dispense: 60 tablet; Refill: 6 - atorvastatin (LIPITOR) 10 MG tablet; Take 1 tablet (10 mg total) by mouth daily.  Dispense: 90 tablet; Refill: 3 - glimepiride (AMARYL) 2  MG tablet; Take 1 tablet (2 mg total) by mouth daily before breakfast.  Dispense: 30 tablet; Refill: 3  2. Tobacco dependence Advised to quit.  Discussed health risks associated with smoking.  Patient not ready to give a trial of quitting.  Less than 5 minutes spent on counseling.  3. Drinking binge Went over how much is too much in one setting for a man.  He is drinking more than what is considered standard drink in one setting.  I recommend that he avoid drinking more than two 12 oz beers in one setting.  4. Screening for colon cancer Discussed colon cancer screening methods.  Patient willing to be screened with fit test.  No family history of colon cancer. - Fecal occult blood, imunochemical([facility])  5. Influenza vaccination declined   Patient was given the opportunity to ask questions.  Patient verbalized understanding of the plan and was able to repeat key elements of the plan.   Orders Placed This Encounter  Procedures  . Glucose (CBG)  . HgB A1c     Requested Prescriptions    No prescriptions requested or ordered in this encounter    No follow-ups on file.  Karle Plumber, MD, FACP

## 2020-12-10 MED FILL — GLIMEPIRIDE 2 MG TABS: 2 | 30 days supply | Qty: 30 | Fill #0

## 2020-12-10 MED FILL — METFORMIN HCL 1000 MG TABS: 1000 | 30 days supply | Qty: 60 | Fill #0

## 2020-12-10 MED FILL — ATORVASTATIN 10 MG TABLET: 10 | 30 days supply | Qty: 30 | Fill #0

## 2020-12-20 MED FILL — GLIMEPIRIDE 2 MG TABS: 2 | 30 days supply | Qty: 30 | Fill #0

## 2020-12-20 MED FILL — ATORVASTATIN 10 MG TABLET: 10 | 30 days supply | Qty: 30 | Fill #0

## 2020-12-20 MED FILL — METFORMIN HCL 1000 MG TABS: 1000 | 30 days supply | Qty: 60 | Fill #0

## 2020-12-22 ENCOUNTER — Telehealth: Payer: Self-pay

## 2020-12-22 LAB — FECAL OCCULT BLOOD, IMMUNOCHEMICAL: Fecal Occult Bld: NEGATIVE

## 2020-12-22 NOTE — Telephone Encounter (Signed)
Contacted pt to go over lab results pt didn't answer lvm  

## 2021-01-31 MED FILL — GLIMEPIRIDE 2 MG TABS: 2 | 30 days supply | Qty: 30 | Fill #1

## 2021-01-31 MED FILL — METFORMIN HCL 1000 MG TABS: 1000 | 30 days supply | Qty: 60 | Fill #1

## 2021-03-10 ENCOUNTER — Ambulatory Visit: Payer: Self-pay | Admitting: Internal Medicine

## 2021-03-23 ENCOUNTER — Other Ambulatory Visit: Payer: Self-pay

## 2021-03-23 MED FILL — Metformin HCl Tab 1000 MG: ORAL | 30 days supply | Qty: 60 | Fill #0 | Status: AC

## 2021-03-23 MED FILL — Glimepiride Tab 2 MG: ORAL | 30 days supply | Qty: 30 | Fill #0 | Status: AC

## 2021-03-23 MED FILL — Atorvastatin Calcium Tab 10 MG (Base Equivalent): ORAL | 30 days supply | Qty: 30 | Fill #0 | Status: AC

## 2021-03-24 ENCOUNTER — Other Ambulatory Visit: Payer: Self-pay

## 2021-05-13 ENCOUNTER — Ambulatory Visit: Payer: Self-pay | Admitting: Internal Medicine

## 2021-07-11 ENCOUNTER — Other Ambulatory Visit: Payer: Self-pay

## 2021-07-11 ENCOUNTER — Ambulatory Visit: Payer: Self-pay | Attending: Internal Medicine | Admitting: Internal Medicine

## 2021-07-11 DIAGNOSIS — Z5329 Procedure and treatment not carried out because of patient's decision for other reasons: Secondary | ICD-10-CM

## 2021-07-11 NOTE — Progress Notes (Signed)
Pt no showed for this telephone appt.

## 2021-07-26 ENCOUNTER — Ambulatory Visit: Payer: Self-pay | Attending: Internal Medicine | Admitting: Internal Medicine

## 2021-07-26 ENCOUNTER — Encounter: Payer: Self-pay | Admitting: Internal Medicine

## 2021-07-26 ENCOUNTER — Other Ambulatory Visit: Payer: Self-pay

## 2021-07-26 DIAGNOSIS — E1165 Type 2 diabetes mellitus with hyperglycemia: Secondary | ICD-10-CM

## 2021-07-26 DIAGNOSIS — Z1159 Encounter for screening for other viral diseases: Secondary | ICD-10-CM

## 2021-07-26 DIAGNOSIS — F17200 Nicotine dependence, unspecified, uncomplicated: Secondary | ICD-10-CM

## 2021-07-26 DIAGNOSIS — Z114 Encounter for screening for human immunodeficiency virus [HIV]: Secondary | ICD-10-CM

## 2021-07-26 DIAGNOSIS — Z23 Encounter for immunization: Secondary | ICD-10-CM

## 2021-07-26 NOTE — Progress Notes (Signed)
Patient ID: Nathan FRIEDEN, male   DOB: December 06, 1960, 60 y.o.   MRN: [id number] Virtual Visit via Telephone Note  I connected with Halford Chessman on 07/26/2021 at 8:36 a.m by telephone and verified that I am speaking with the correct person using two identifiers  Location: Patient: home Provider: office  Participants: Myself Patient   I discussed the limitations, risks, security and privacy concerns of performing an evaluation and management service by telephone and the availability of in person appointments. I also discussed with the patient that there may be a patient responsible charge related to this service. The patient expressed understanding and agreed to proceed.   History of Present Illness: Patient with history of, diabetes, pancreatitis, ETOH abuse, tob dep COPD.  Last seen in January of this year.  Purpose of today's visit is chronic disease management.  DM: A1c on last visit was 12.  I had recommended adding Lantus insulin to metformin.  Patient declined.  So we added Amaryl instead.  Patient reports compliance with both medications.  However based on the last prescriptions and when it they were last dispensed, he should have been out of both. -Not checking blood sugars but does have a kit. -"Not eating as good as I should."  Reports he is eating more vegetables and that he loves meat. States that he is very active.  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 Overdue for eye exam.  No insurance.  Tobacco dependence: Still smoking 1 pack of cigarettes per day.  Not ready to quit.  EtOH use: Did cut back because of limited finances.  States that it is more important for him to be able to eat than to drink alcohol.  HM: Agrees to receiving the flu shot and pneumonia vaccine.  Agrees to screening for HIV and hepatitis C.  Labs ordered on last visit were not done.  He is agreeable to coming to the lab this week to have these done.    Outpatient  Encounter Medications as of 07/26/2021  Medication Sig   aspirin EC 81 MG tablet Take 1 tablet (81 mg total) by mouth daily. (Patient not taking: No sig reported)   atorvastatin (LIPITOR) 10 MG tablet TAKE 1 TABLET (10 MG TOTAL) BY MOUTH DAILY.   Blood Glucose Monitoring Suppl (TRUE METRIX AIR GLUCOSE METER) w/Device KIT 1 kit by Does not apply route 2 (two) times daily at 8 am and 10 pm.   glimepiride (AMARYL) 2 MG tablet TAKE 1 TABLET (2 MG TOTAL) BY MOUTH DAILY BEFORE BREAKFAST.   glucose blood (TRUE METRIX BLOOD GLUCOSE TEST) test strip Use as instructed-fasting before breakfast; 2 hours after largest meal of the day and before bedtime   metFORMIN (GLUCOPHAGE) 1000 MG tablet TAKE 1 TABLET (1,000 MG TOTAL) BY MOUTH 2 (TWO) TIMES DAILY WITH A MEAL.   TRUEPLUS LANCETS 28G MISC Use when checking blood sugar fasting, at bedtime and about 2 hours after largest meal of the day   No facility-administered encounter medications on file as of 07/26/2021.      Observations/Objective: Results for orders placed or performed in visit on 12/09/20  Fecal occult blood, imunochemical([facility])   Specimen: Stool   ST  Result Value Ref Range   Fecal Occult Bld Negative Negative  Glucose (CBG)  Result Value Ref Range   POC Glucose 198 (A) 70 - 99 mg/dl  HgB A1c  Result Value Ref Range   Hemoglobin A1C 12.0 (A) 4.0 - 5.6 %  HbA1c POC (<> result, manual entry)     HbA1c, POC (prediabetic range)     HbA1c, POC (controlled diabetic range)       Assessment and Plan: 1. Type 2 diabetes mellitus with hyperglycemia, without long-term current use of insulin (HCC) Level of control unknown; I question compliance with meds based on last fill dates.  Discuss importance of good control to prevent complications.  Encouraged him to check BS daily before BF.  Went over BS goals - CBC; Future - Comprehensive metabolic panel; Future - Lipid panel; Future - Microalbumin / creatinine urine ratio; Future  2.  Tobacco dependence Advised to quit.  Pt not ready to give trail of quiting  3. Need for hepatitis C screening test Agrees to screening - Hepatitis C Antibody; Future  4. Screening for HIV (human immunodeficiency virus) Agrees to screening - HIV antibody (with reflex); Future  5. Need for influenza vaccination 6. Need for vaccination against Streptococcus pneumoniae Pt willing to receive flu and Prevnar 20 from clinical pharmacist.  Message sent to front desk to schedule.  Follow Up Instructions: 4 mth   I discussed the assessment and treatment plan with the patient. The patient was provided an opportunity to ask questions and all were answered. The patient agreed with the plan and demonstrated an understanding of the instructions.   The patient was advised to call back or seek an in-person evaluation if the symptoms worsen or if the condition fails to improve as anticipated.  I  Spent 12 minutes on this telephone encounter  Karle Plumber, MD

## 2021-07-26 NOTE — Addendum Note (Signed)
Addended by: Jonah Blue B on: 07/26/2021 01:20 PM   Modules accepted: Orders

## 2021-07-27 ENCOUNTER — Telehealth: Payer: Self-pay

## 2021-07-27 NOTE — Telephone Encounter (Signed)
Pt has been scheduled and reminder has been mailed.   Appointment made for vaccines and pt is aware.

## 2021-07-27 NOTE — Telephone Encounter (Signed)
-----   Message from Marcine Matar, MD sent at 07/26/2021 12:03 PM EDT ----- Please schedule with Franky Macho for flu and Prevnar 20 vaccines. Give f/u with me in 4 mths.

## 2021-08-01 ENCOUNTER — Ambulatory Visit: Payer: Self-pay | Attending: Internal Medicine | Admitting: Pharmacist

## 2021-08-01 ENCOUNTER — Other Ambulatory Visit: Payer: Self-pay

## 2021-08-01 DIAGNOSIS — Z23 Encounter for immunization: Secondary | ICD-10-CM

## 2021-08-01 MED FILL — Metformin HCl Tab 1000 MG: ORAL | 30 days supply | Qty: 60 | Fill #1 | Status: AC

## 2021-08-01 MED FILL — Atorvastatin Calcium Tab 10 MG (Base Equivalent): ORAL | 30 days supply | Qty: 30 | Fill #1 | Status: AC

## 2021-08-01 MED FILL — Glimepiride Tab 2 MG: ORAL | 30 days supply | Qty: 30 | Fill #1 | Status: AC

## 2021-08-01 NOTE — Progress Notes (Signed)
Patient presents for vaccination against strep pneumo and influenza per orders of Dr. Laural Benes. Consent given. Counseling provided. No contraindications exists. Vaccine administered without incident.   Patient is uninsured. Will send off for patient assistance for Prevnar20.   Butch Penny, PharmD, P[place]y [place], CPP Clinical Pharmacist [facility] [phone]

## 2021-10-10 ENCOUNTER — Other Ambulatory Visit: Payer: Self-pay

## 2021-10-10 ENCOUNTER — Other Ambulatory Visit: Payer: Self-pay | Admitting: Internal Medicine

## 2021-10-10 DIAGNOSIS — E1165 Type 2 diabetes mellitus with hyperglycemia: Secondary | ICD-10-CM

## 2021-10-10 MED ORDER — GLIMEPIRIDE 2 MG PO TABS
ORAL_TABLET | Freq: Every day | ORAL | 3 refills | Status: DC
  Filled 2021-10-10 – 2021-12-12 (×2): qty 30, 30d supply, fill #0

## 2021-10-10 MED FILL — Atorvastatin Calcium Tab 10 MG (Base Equivalent): ORAL | 30 days supply | Qty: 30 | Fill #2 | Status: AC

## 2021-10-10 MED FILL — Metformin HCl Tab 1000 MG: ORAL | 30 days supply | Qty: 60 | Fill #2 | Status: AC

## 2021-12-01 ENCOUNTER — Ambulatory Visit: Payer: Self-pay | Attending: Internal Medicine | Admitting: Internal Medicine

## 2021-12-12 ENCOUNTER — Other Ambulatory Visit: Payer: Self-pay

## 2021-12-12 ENCOUNTER — Other Ambulatory Visit: Payer: Self-pay | Admitting: Internal Medicine

## 2021-12-12 DIAGNOSIS — E1165 Type 2 diabetes mellitus with hyperglycemia: Secondary | ICD-10-CM

## 2021-12-12 NOTE — Telephone Encounter (Signed)
Requested medication (s) are due for refill today: Yes  Requested medication (s) are on the active medication list: Yes  Last refill:  12/09/20  Future visit scheduled: No  Notes to clinic:  Prescriptions expired and protocol indicates pt. Needs lab work.    Requested Prescriptions  Pending Prescriptions Disp Refills   metFORMIN (GLUCOPHAGE) 1000 MG tablet 60 tablet 6    Sig: TAKE 1 TABLET (1,000 MG TOTAL) BY MOUTH 2 (TWO) TIMES DAILY WITH A MEAL.     Endocrinology:  Diabetes - Biguanides Failed - 12/12/2021  8:59 AM      Failed - Cr in normal range and within 360 days    Creatinine, Ser  Date Value Ref Range Status  07/22/2020 0.67 (L) 0.76 - 1.27 mg/dL Final   Creatinine, POC  Date Value Ref Range Status  03/29/2017 50 mg/dL Final          Failed - HBA1C is between 0 and 7.9 and within 180 days    Hemoglobin A1C  Date Value Ref Range Status  12/09/2020 12.0 (A) 4.0 - 5.6 % Final   HbA1c, POC (prediabetic range)  Date Value Ref Range Status  09/17/2018 10.6 (A) 5.7 - 6.4 % Final   HbA1c, POC (controlled diabetic range)  Date Value Ref Range Status  09/17/2018 10.6 (A) 0.0 - 7.0 % Final   HbA1c POC (<> result, manual entry)  Date Value Ref Range Status  09/17/2018 10.6 4.0 - 5.6 % Final   Hgb A1c MFr Bld  Date Value Ref Range Status  07/22/2020 12.0 (H) 4.8 - 5.6 % Final    Comment:             Prediabetes: 5.7 - 6.4          Diabetes: >6.4          Glycemic control for adults with diabetes: <7.0           Failed - eGFR in normal range and within 360 days    GFR calc Af Amer  Date Value Ref Range Status  07/22/2020 120 >59 mL/min/1.73 Final    Comment:    **[facility] currently reports eGFR in compliance with the current**   recommendations of the [employer]. [facility] will   update reporting as new guidelines are published from the NKF-ASN   Task force.    GFR calc non Af Amer  Date Value Ref Range Status  07/22/2020 104 >59 mL/min/1.73  Final          Passed - Valid encounter within last 6 months    Recent Outpatient Visits           4 months ago Need for vaccination against Streptococcus pneumoniae   [place], Jarome Matin, RPH-CPP   4 months ago Type 2 diabetes mellitus with hyperglycemia, without long-term current use of insulin [facility])   [place] Ladell Pier, MD   5 months ago No-show for appointment   [place], Deborah B, MD   1 year ago Type 2 diabetes mellitus with hyperglycemia, without long-term current use of insulin [facility])   [place], MD   1 year ago Type 2 diabetes mellitus with hyperglycemia, without long-term current use of insulin [facility])   [place], [place], [place]  atorvastatin (LIPITOR) 10 MG tablet 90 tablet 3    Sig: TAKE 1 TABLET (10 MG TOTAL) BY MOUTH DAILY.     Cardiovascular:  Antilipid - Statins Failed - 12/12/2021  8:59 AM      Failed - Total Cholesterol in normal range and within 360 days    Cholesterol, Total  Date Value Ref Range Status  09/26/2018 201 (H) 100 - 199 mg/dL Final          Failed - LDL in normal range and within 360 days    LDL Calculated  Date Value Ref Range Status  09/26/2018 101 (H) 0 - 99 mg/dL Final          Failed - HDL in normal range and within 360 days    HDL  Date Value Ref Range Status  09/26/2018 88 >39 mg/dL Final          Failed - Triglycerides in normal range and within 360 days    Triglycerides  Date Value Ref Range Status  09/26/2018 62 0 - 149 mg/dL Final          Passed - Patient is not pregnant      Passed - Valid encounter within last 12 months    Recent Outpatient Visits           4 months ago Need for vaccination against Streptococcus pneumoniae   [place],  Annie Main L, RPH-CPP   4 months ago Type 2 diabetes mellitus with hyperglycemia, without long-term current use of insulin ([place])   [place] Ladell Pier, MD   5 months ago No-show for appointment   [place], Deborah B, MD   1 year ago Type 2 diabetes mellitus with hyperglycemia, without long-term current use of insulin [facility])   [place], Deborah B, MD   1 year ago Type 2 diabetes mellitus with hyperglycemia, without long-term current use of insulin [facility])   [place], [place], [place]

## 2021-12-13 ENCOUNTER — Other Ambulatory Visit: Payer: Self-pay

## 2021-12-14 ENCOUNTER — Other Ambulatory Visit (HOSPITAL_COMMUNITY): Payer: Self-pay

## 2021-12-14 ENCOUNTER — Other Ambulatory Visit: Payer: Self-pay | Admitting: Internal Medicine

## 2021-12-14 ENCOUNTER — Other Ambulatory Visit: Payer: Self-pay

## 2021-12-14 DIAGNOSIS — E1165 Type 2 diabetes mellitus with hyperglycemia: Secondary | ICD-10-CM

## 2021-12-14 MED ORDER — METFORMIN HCL 1000 MG PO TABS
ORAL_TABLET | Freq: Two times a day (BID) | ORAL | 0 refills | Status: DC
  Filled 2021-12-14: qty 60, 30d supply, fill #0

## 2021-12-14 MED ORDER — ATORVASTATIN CALCIUM 10 MG PO TABS
ORAL_TABLET | Freq: Every day | ORAL | 0 refills | Status: DC
  Filled 2021-12-14: qty 30, 30d supply, fill #0

## 2021-12-14 NOTE — Telephone Encounter (Signed)
Requested Prescriptions  Pending Prescriptions Disp Refills   atorvastatin (LIPITOR) 10 MG tablet 90 tablet 3    Sig: TAKE 1 TABLET (10 MG TOTAL) BY MOUTH DAILY.     Cardiovascular:  Antilipid - Statins Failed - 12/14/2021 10:39 AM      Failed - Lipid Panel in normal range within the last 12 months    Cholesterol, Total  Date Value Ref Range Status  09/26/2018 201 (H) 100 - 199 mg/dL Final   LDL Calculated  Date Value Ref Range Status  09/26/2018 101 (H) 0 - 99 mg/dL Final   HDL  Date Value Ref Range Status  09/26/2018 88 >39 mg/dL Final   Triglycerides  Date Value Ref Range Status  09/26/2018 62 0 - 149 mg/dL Final         Passed - Patient is not pregnant      Passed - Valid encounter within last 12 months    Recent Outpatient Visits          4 months ago Need for vaccination against Streptococcus pneumoniae   [place], Annie Main L, RPH-CPP   4 months ago Type 2 diabetes mellitus with hyperglycemia, without long-term current use of insulin ([place])   [place] Ladell Pier, MD   5 months ago No-show for appointment   [place] Karle Plumber B, MD   1 year ago Type 2 diabetes mellitus with hyperglycemia, without long-term current use of insulin [facility])   [place], Deborah B, MD   1 year ago Type 2 diabetes mellitus with hyperglycemia, without long-term current use of insulin [facility])   [place], Dionne Bucy, [place]      Future Appointments            In 1 month Ladell Pier, MD [place]            metFORMIN (GLUCOPHAGE) 1000 MG tablet 60 tablet 6    Sig: TAKE 1 TABLET (1,000 MG TOTAL) BY MOUTH 2 (TWO) TIMES DAILY WITH A MEAL.     Endocrinology:  Diabetes - Biguanides Failed - 12/14/2021 10:39 AM      Failed - Cr in normal range and within 360  days    Creatinine, Ser  Date Value Ref Range Status  07/22/2020 0.67 (L) 0.76 - 1.27 mg/dL Final   Creatinine, POC  Date Value Ref Range Status  03/29/2017 50 mg/dL Final         Failed - HBA1C is between 0 and 7.9 and within 180 days    Hemoglobin A1C  Date Value Ref Range Status  12/09/2020 12.0 (A) 4.0 - 5.6 % Final   HbA1c, POC (prediabetic range)  Date Value Ref Range Status  09/17/2018 10.6 (A) 5.7 - 6.4 % Final   HbA1c, POC (controlled diabetic range)  Date Value Ref Range Status  09/17/2018 10.6 (A) 0.0 - 7.0 % Final   HbA1c POC (<> result, manual entry)  Date Value Ref Range Status  09/17/2018 10.6 4.0 - 5.6 % Final   Hgb A1c MFr Bld  Date Value Ref Range Status  07/22/2020 12.0 (H) 4.8 - 5.6 % Final    Comment:             Prediabetes: 5.7 - 6.4          Diabetes: >6.4  Glycemic control for adults with diabetes: <7.0          Failed - eGFR in normal range and within 360 days    GFR calc Af Amer  Date Value Ref Range Status  07/22/2020 120 >59 mL/min/1.73 Final    Comment:    **[facility] currently reports eGFR in compliance with the current**   recommendations of the [employer]. [facility] will   update reporting as new guidelines are published from the NKF-ASN   Task force.    GFR calc non Af Amer  Date Value Ref Range Status  07/22/2020 104 >59 mL/min/1.73 Final         Failed - B12 Level in normal range and within 720 days    Vitamin B-12  Date Value Ref Range Status  07/07/2011 239 211 - 911 pg/mL Final         Failed - CBC within normal limits and completed in the last 12 months    WBC  Date Value Ref Range Status  07/22/2020 9.4 3.4 - 10.8 x10E3/uL Final  07/14/2019 7.5 4.0 - 10.5 K/uL Final   RBC  Date Value Ref Range Status  07/22/2020 4.79 4.14 - 5.80 x10E6/uL Final  07/14/2019 4.20 (L) 4.22 - 5.81 MIL/uL Final   Hemoglobin  Date Value Ref Range Status  07/22/2020 15.7 13.0 - 17.7 g/dL Final   Hematocrit   Date Value Ref Range Status  07/22/2020 47.1 37.5 - 51.0 % Final   MCHC  Date Value Ref Range Status  07/22/2020 33.3 31.5 - 35.7 g/dL Final  07/14/2019 34.3 30.0 - 36.0 g/dL Final   [facility]  Date Value Ref Range Status  07/22/2020 32.8 26.6 - 33.0 pg Final  07/14/2019 33.8 26.0 - 34.0 pg Final   MCV  Date Value Ref Range Status  07/22/2020 98 (H) 79 - 97 fL Final   No results found for: PLTCOUNTKUC, LABPLAT, POCPLA RDW  Date Value Ref Range Status  07/22/2020 11.8 11.6 - 15.4 % Final         Passed - Valid encounter within last 6 months    Recent Outpatient Visits          4 months ago Need for vaccination against Streptococcus pneumoniae   [place], Annie Main L, RPH-CPP   4 months ago Type 2 diabetes mellitus with hyperglycemia, without long-term current use of insulin ([place])   [place] Ladell Pier, MD   5 months ago No-show for appointment   [place], Deborah B, MD   1 year ago Type 2 diabetes mellitus with hyperglycemia, without long-term current use of insulin [place])   [place], Deborah B, MD   1 year ago Type 2 diabetes mellitus with hyperglycemia, without long-term current use of insulin [facility])   [place], Dionne [place], [place]      Future Appointments            In 1 month [place] Emery, Dalbert Batman, MD [place]

## 2021-12-14 NOTE — Telephone Encounter (Signed)
Pt called in to follow up on refill request for 2 medications. Advised pt of current status, pt expressed understanding.

## 2021-12-15 ENCOUNTER — Other Ambulatory Visit: Payer: Self-pay

## 2022-02-07 ENCOUNTER — Encounter: Payer: Self-pay | Admitting: Internal Medicine

## 2022-02-07 ENCOUNTER — Other Ambulatory Visit: Payer: Self-pay

## 2022-02-07 ENCOUNTER — Ambulatory Visit: Payer: Self-pay | Attending: Internal Medicine | Admitting: Internal Medicine

## 2022-02-07 VITALS — BP 127/75 | HR 77 | Resp 16 | Ht 72.0 in | Wt 132.4 lb

## 2022-02-07 DIAGNOSIS — F17200 Nicotine dependence, unspecified, uncomplicated: Secondary | ICD-10-CM

## 2022-02-07 DIAGNOSIS — E1165 Type 2 diabetes mellitus with hyperglycemia: Secondary | ICD-10-CM

## 2022-02-07 DIAGNOSIS — K029 Dental caries, unspecified: Secondary | ICD-10-CM

## 2022-02-07 DIAGNOSIS — E1169 Type 2 diabetes mellitus with other specified complication: Secondary | ICD-10-CM

## 2022-02-07 DIAGNOSIS — Z1211 Encounter for screening for malignant neoplasm of colon: Secondary | ICD-10-CM

## 2022-02-07 DIAGNOSIS — R636 Underweight: Secondary | ICD-10-CM

## 2022-02-07 DIAGNOSIS — K649 Unspecified hemorrhoids: Secondary | ICD-10-CM

## 2022-02-07 DIAGNOSIS — Z1159 Encounter for screening for other viral diseases: Secondary | ICD-10-CM

## 2022-02-07 DIAGNOSIS — E785 Hyperlipidemia, unspecified: Secondary | ICD-10-CM

## 2022-02-07 DIAGNOSIS — D539 Nutritional anemia, unspecified: Secondary | ICD-10-CM

## 2022-02-07 DIAGNOSIS — Z114 Encounter for screening for human immunodeficiency virus [HIV]: Secondary | ICD-10-CM

## 2022-02-07 LAB — POCT GLYCOSYLATED HEMOGLOBIN (HGB A1C): HbA1c, POC (controlled diabetic range): 8.8 % — AB (ref 0.0–7.0)

## 2022-02-07 LAB — GLUCOSE, POCT (MANUAL RESULT ENTRY): POC Glucose: 184 mg/dl — AB (ref 70–99)

## 2022-02-07 MED ORDER — GLIMEPIRIDE 2 MG PO TABS
2.0000 mg | ORAL_TABLET | Freq: Two times a day (BID) | ORAL | 1 refills | Status: DC
  Filled 2022-02-07: qty 60, 30d supply, fill #0

## 2022-02-07 MED ORDER — ATORVASTATIN CALCIUM 10 MG PO TABS
10.0000 mg | ORAL_TABLET | Freq: Every day | ORAL | 1 refills | Status: DC
  Filled 2022-02-07: qty 30, 30d supply, fill #0

## 2022-02-07 MED ORDER — METFORMIN HCL 1000 MG PO TABS
1000.0000 mg | ORAL_TABLET | Freq: Two times a day (BID) | ORAL | 1 refills | Status: DC
  Filled 2022-02-07: qty 60, 30d supply, fill #0
  Filled 2022-08-28 (×2): qty 60, 30d supply, fill #1

## 2022-02-07 NOTE — Progress Notes (Addendum)
? ? ?Patient ID: [name], male    DOB: 11/15/1960  MRN: [id number] ? ?CC: Diabetes ? ? ?Subjective: ?[name] is a 61 y.o. male who presents for chronic disease management ?His concerns today include:  ?Patient with history of, diabetes, HL, pancreatitis, ETOH abuse, tob dep COPD.   ? ?DM: ?Results for orders placed or performed in visit on 02/07/22  ?POCT glucose (manual entry)  ?Result Value Ref Range  ? POC Glucose 184 (A) 70 - 99 mg/dl  ?POCT glycosylated hemoglobin (Hb A1C)  ?Result Value Ref Range  ? Hemoglobin A1C    ? HbA1c POC (<> result, manual entry)    ? HbA1c, POC (prediabetic range)    ? HbA1c, POC (controlled diabetic range) 8.8 (A) 0.0 - 7.0 %  ?.taking Metformin and Amaryl consistently. Denies diarrhea or GI upset from Metformin ?Not checking BS, but has device. ?Not much fried foods.  Love see food and chicken mainly.  Eats pork/beef on occasion ?Drinks Gatorade Zero and milk 2% ?[place] ?Down 7 lbs from 11/2020 and BMI underwgh.  Loss wgh in past 2 wks due to inflamed hemorrhoids.  He was afraid to eat because hemorrhoids hurt when he had BM.  No blood in stools.  He tells me that they hemorrhoids are no longer inflamed. ?-over due for eye exam.  Denies blurred vision. ?No numbness or tingling in the hands or feet. ? ?HL:  taking and tolerating Lipitor ? ?Tob dep: 1 pk a day.  Not ready to quit ? ?HM: Due for shingrix, colon CA screening, eye exam.  Reports having had 3 COVID Pfizer vaccines.  Prefers to come back and have the first Shingrix shot later. ?Patient Active Problem List  ? Diagnosis Date Noted  ? Type 2 diabetes mellitus with hyperglycemia (HCC) 02/07/2022  ? COVID-19 vaccine series completed 12/09/2020  ? Influenza vaccination declined 12/09/2020  ? Drinking binge 12/09/2020  ? Hypertension 11/04/2013  ? Blurry vision, bilateral 11/04/2013  ? Pancreatitis 02/09/2012  ? Alcohol abuse 02/09/2012  ? TOBACCO ABUSE 10/19/2010  ? COPD  10/19/2010  ? PANCREATITIS, ACUTE 10/19/2010  ?  ? ?Current Outpatient Medications on File Prior to Visit  ?Medication Sig Dispense Refill  ? aspirin EC 81 MG tablet Take 1 tablet (81 mg total) by mouth daily. (Patient not taking: No sig reported) 90 tablet 3  ? atorvastatin (LIPITOR) 10 MG tablet TAKE 1 TABLET (10 MG TOTAL) BY MOUTH DAILY. 90 tablet 0  ? Blood Glucose Monitoring Suppl (TRUE METRIX AIR GLUCOSE METER) w/Device KIT 1 kit by Does not apply route 2 (two) times daily at 8 am and 10 pm. 1 kit 0  ? glimepiride (AMARYL) 2 MG tablet TAKE 1 TABLET (2 MG TOTAL) BY MOUTH DAILY BEFORE BREAKFAST. 30 tablet 3  ? glucose blood (TRUE METRIX BLOOD GLUCOSE TEST) test strip Use as instructed-fasting before breakfast; 2 hours after largest meal of the day and before bedtime 100 each 12  ? metFORMIN (GLUCOPHAGE) 1000 MG tablet TAKE 1 TABLET (1,000 MG TOTAL) BY MOUTH 2 (TWO) TIMES DAILY WITH A MEAL. 180 tablet 0  ? TRUEPLUS LANCETS 28G MISC Use when checking blood sugar fasting, at bedtime and about 2 hours after largest meal of the day 100 each 6  ? ?No current facility-administered medications on file prior to visit.  ? ? ?No Known Allergies ? ?Social History  ? ?Socioeconomic History  ? Marital status: Single  ?    Spouse name: Not on file  ? Number of children: Not on file  ? Years of education: Not on file  ? Highest education level: Not on file  ?Occupational History  ? Not on file  ?Tobacco Use  ? Smoking status: Every Day  ?  Packs/day: 1.00  ?  Years: 35.00  ?  Pack years: 35.00  ?  Types: Cigarettes  ? Smokeless tobacco: Never  ?Substance and Sexual Activity  ? Alcohol use: Yes  ?  Comment: occassionally   ? Drug use: No  ? Sexual activity: Not Currently  ?Other Topics Concern  ? Not on file  ?Social History Narrative  ? Not on file  ? ?Social Determinants of Health  ? ?Financial Resource Strain: Not on file  ?Food Insecurity: Not on file  ?Transportation Needs: Not on file  ?Physical Activity: Not on file   ?Stress: Not on file  ?Social Connections: Not on file  ?Intimate Partner Violence: Not on file  ? ? ?Family History  ?Problem Relation Age of Onset  ? Diabetes Sister   ? ? ?Past Surgical History:  ?Procedure Laterality Date  ? NO PAST SURGERIES    ? ? ?ROS: ?Review of Systems ?Negative except as stated above ? ?PHYSICAL EXAM: ?BP 127/75   Pulse 77   Resp 16   Ht 6' (1.829 m)   Wt 132 lb 6.4 oz (60.1 kg)   SpO2 95%   BMI 17.96 kg/m?   ?Wt Readings from Last 3 Encounters:  ?02/07/22 132 lb 6.4 oz (60.1 kg)  ?12/09/20 139 lb (63 kg)  ?07/22/20 133 lb (60.3 kg)  ? ? ?Physical Exam ? ?General appearance -older Caucasian male who appears underweight for height.  He is in NAD. ?Mental status - normal mood, behavior, speech, dress, motor activity, and thought processes ?Eyes - pupils equal and reactive, extraocular eye movements intact ?Nose - normal and patent, no erythema, discharge or polyps ?Mouth - mucous membranes moist, pharynx normal without lesions.  Several dental cavities noted. ?Chest - clear to auscultation, no wheezes, rales or rhonchi, symmetric air entry ?Heart - normal rate, regular rhythm, normal S1, S2, no murmurs, rubs, clicks or gallops ?Rectal -patient declined. ?Extremities - peripheral pulses normal, no pedal edema, no clubbing or cyanosis ?MSK: Dupuytren contracture of the left fifth finger. ?Diabetic Foot Exam - Simple   ?Simple Foot Form ?Diabetic Foot exam was performed with the following findings: Yes 02/07/2022  9:21 AM  ?Visual Inspection ?No deformities, no ulcerations, no other skin breakdown bilaterally: Yes ?Sensation Testing ?Intact to touch and monofilament testing bilaterally: Yes ?Pulse Check ?Posterior Tibialis and Dorsalis pulse intact bilaterally: Yes ?Comments ?Toenails are discolored and overgrown.  Small callus on the medial aspect of the right big toe.  Bony overgrowth on the medial aspect of left midfoot. ?  ? ? ? ?  Latest Ref Rng & Units 07/22/2020  ? 10:30 AM 07/23/2019   ?  2:53 PM 07/14/2019  ?  5:59 PM  ?CMP  ?Glucose 65 - 99 mg/dL 238   315   256    ?BUN 8 - 27 mg/dL 10   13   17    ?Creatinine 0.76 - 1.27 mg/dL 0.67   0.58   0.79    ?Sodium 134 - 144 mmol/L 135   136   132    ?Potassium 3.5 - 5.2 mmol/L 4.5   4.6   3.8    ?Chloride 96 - 106 mmol/L 97   98     91    ?CO2 20 - 29 mmol/L 23   24   29    ?Calcium 8.6 - 10.2 mg/dL 9.4   9.5   9.4    ?Total Protein 6.0 - 8.5 g/dL 7.1   6.6   7.2    ?Total Bilirubin 0.0 - 1.2 mg/dL 0.8   0.3   3.2    ?Alkaline Phos 48 - 121 IU/L 92   119   92    ?AST 0 - 40 IU/L 21   17   98    ?ALT 0 - 44 IU/L 39   46   129    ? ?Lipid Panel  ?   ?Component Value Date/Time  ? CHOL 201 (H) 09/26/2018 1132  ? TRIG 62 09/26/2018 1132  ? HDL 88 09/26/2018 1132  ? CHOLHDL 2.3 09/26/2018 1132  ? CHOLHDL 6.0 07/09/2011 0600  ? VLDL 27 07/09/2011 0600  ? LDLCALC 101 (H) 09/26/2018 1132  ? ? ?CBC ?   ?Component Value Date/Time  ? WBC 9.4 07/22/2020 1030  ? WBC 7.5 07/14/2019 1759  ? RBC 4.79 07/22/2020 1030  ? RBC 4.20 (L) 07/14/2019 1759  ? HGB 15.7 07/22/2020 1030  ? HCT 47.1 07/22/2020 1030  ? PLT 224 07/22/2020 1030  ? MCV 98 (H) 07/22/2020 1030  ? MCH 32.8 07/22/2020 1030  ? MCH 33.8 07/14/2019 1759  ? MCHC 33.3 07/22/2020 1030  ? MCHC 34.3 07/14/2019 1759  ? RDW 11.8 07/22/2020 1030  ? LYMPHSABS 2.2 07/22/2020 1030  ? MONOABS 0.7 07/14/2019 1759  ? EOSABS 0.2 07/22/2020 1030  ? BASOSABS 0.1 07/22/2020 1030  ? ? ?ASSESSMENT AND PLAN: ?1. Type 2 diabetes mellitus with hyperglycemia, without long-term current use of insulin (HCC) ?A1c not at goal but improved. ?Continue metformin 1 g twice a day.  Increase Amaryl from 2 mg daily to 2 mg twice a day. ?Encourage healthy eating habits.  Continue to remain active. ?- Microalbumin / creatinine urine ratio ?- Lipid panel ?- Comprehensive metabolic panel ?- CBC ?- POCT glucose (manual entry) ?- POCT glycosylated hemoglobin (Hb A1C) ?- glimepiride (AMARYL) 2 MG tablet; Take 1 tablet (2 mg total) by mouth 2 (two)  times daily.  Dispense: 180 tablet; Refill: 1 ?- metFORMIN (GLUCOPHAGE) 1000 MG tablet; Take 1 tablet (1,000 mg total) by mouth 2 (two) times daily with a meal.  Dispense: 180 tablet; Refill: 1 ?- atorvastatin (LIPITOR) 1

## 2022-02-07 NOTE — Patient Instructions (Signed)
Increase glimepiride to 2 mg twice a day. ?Try to increase fiber in the diet as discussed to help prevent constipation which can lead to flareup of hemorrhoids. ?Try to check your blood sugars at least once a day before a meal with goal being 90-130. ?Get the forms for the orange card/cone discount card and apply for it.  Once approved let me know so that we can refer you to the dentist. ? ?

## 2022-02-08 ENCOUNTER — Telehealth: Payer: Self-pay | Admitting: Internal Medicine

## 2022-02-08 LAB — COMPREHENSIVE METABOLIC PANEL
ALT: 15 IU/L (ref 0–44)
AST: 14 IU/L (ref 0–40)
Albumin/Globulin Ratio: 1.8 (ref 1.2–2.2)
Albumin: 4 g/dL (ref 3.8–4.8)
Alkaline Phosphatase: 70 IU/L (ref 44–121)
BUN/Creatinine Ratio: 17 (ref 10–24)
BUN: 8 mg/dL (ref 8–27)
Bilirubin Total: 0.5 mg/dL (ref 0.0–1.2)
CO2: 22 mmol/L (ref 20–29)
Calcium: 8.8 mg/dL (ref 8.6–10.2)
Chloride: 92 mmol/L — ABNORMAL LOW (ref 96–106)
Creatinine, Ser: 0.46 mg/dL — ABNORMAL LOW (ref 0.76–1.27)
Globulin, Total: 2.2 g/dL (ref 1.5–4.5)
Glucose: 161 mg/dL — ABNORMAL HIGH (ref 70–99)
Potassium: 4.3 mmol/L (ref 3.5–5.2)
Sodium: 127 mmol/L — ABNORMAL LOW (ref 134–144)
Total Protein: 6.2 g/dL (ref 6.0–8.5)
eGFR: 118 mL/min/{1.73_m2} (ref >59)

## 2022-02-08 LAB — HIV ANTIBODY (ROUTINE TESTING W REFLEX): HIV Screen 4th Generation wRfx: NONREACTIVE

## 2022-02-08 LAB — LIPID PANEL
Chol/HDL Ratio: 2.1 ratio (ref 0.0–5.0)
Cholesterol, Total: 120 mg/dL (ref 100–199)
HDL: 58 mg/dL (ref >39)
LDL Chol Calc (NIH): 50 mg/dL (ref 0–99)
Triglycerides: 55 mg/dL (ref 0–149)
VLDL Cholesterol Cal: 12 mg/dL (ref 5–40)

## 2022-02-08 LAB — CBC
Hematocrit: 35.2 % — ABNORMAL LOW (ref 37.5–51.0)
Hemoglobin: 12.3 g/dL — ABNORMAL LOW (ref 13.0–17.7)
MCH: 34.9 pg — ABNORMAL HIGH (ref 26.6–33.0)
MCHC: 34.9 g/dL (ref 31.5–35.7)
MCV: 100 fL — ABNORMAL HIGH (ref 79–97)
Platelets: 220 10*3/uL (ref 150–450)
RBC: 3.52 x10E6/uL — ABNORMAL LOW (ref 4.14–5.80)
RDW: 13.1 % (ref 11.6–15.4)
WBC: 10.2 10*3/uL (ref 3.4–10.8)

## 2022-02-08 LAB — HEPATITIS C ANTIBODY: Hep C Virus Ab: NONREACTIVE

## 2022-02-08 NOTE — Telephone Encounter (Signed)
PC placed to pt again to go over lab results.   I left a message informing that I was calling about his lab results.  I will send him a lab letter since I am not able to reach him via phone. ? ?

## 2022-02-08 NOTE — Addendum Note (Signed)
Addended by: Jonah Blue B on: 02/08/2022 09:51 AM ? ? Modules accepted: Orders ? ?

## 2022-02-08 NOTE — Telephone Encounter (Signed)
Letter has been mailed.

## 2022-02-08 NOTE — Telephone Encounter (Signed)
Phone call placed to patient today to go over lab results.  I left a message on his voicemail informing him of who I am and that I was calling to go over lab results.  I will try to reach him again later. ?

## 2022-02-10 LAB — IRON,TIBC AND FERRITIN PANEL
Ferritin: 70 ng/mL (ref 30–400)
Iron Saturation: 10 % — ABNORMAL LOW (ref 15–55)
Iron: 33 ug/dL — ABNORMAL LOW (ref 38–169)
Total Iron Binding Capacity: 330 ug/dL (ref 250–450)
UIBC: 297 ug/dL (ref 111–343)

## 2022-02-10 LAB — SPECIMEN STATUS REPORT

## 2022-02-10 LAB — FOLATE: Folate: 14.5 ng/mL (ref >3.0)

## 2022-02-10 LAB — VITAMIN B12: Vitamin B-12: 252 pg/mL (ref 232–1245)

## 2022-02-15 ENCOUNTER — Telehealth: Payer: Self-pay | Admitting: Internal Medicine

## 2022-02-15 ENCOUNTER — Other Ambulatory Visit: Payer: Self-pay

## 2022-02-15 MED ORDER — FERROUS SULFATE 325 (65 FE) MG PO TABS
325.0000 mg | ORAL_TABLET | Freq: Every day | ORAL | 0 refills | Status: DC
  Filled 2022-02-15: qty 100, 100d supply, fill #0

## 2022-02-15 MED ORDER — VITAMIN B-12 1000 MCG PO TABS
1000.0000 ug | ORAL_TABLET | Freq: Every day | ORAL | 1 refills | Status: DC
  Filled 2022-02-15: qty 100, 100d supply, fill #0

## 2022-02-15 NOTE — Telephone Encounter (Signed)
I tried to reach patient again today via telephone.  I left a voicemail message informing of who I am and that I was calling to go over his lab results.  Advised to give [place] a call back. ?

## 2022-02-15 NOTE — Telephone Encounter (Signed)
Letter was sent on 3/29 for pt to contact the office to go over lab results.  ?

## 2022-02-21 ENCOUNTER — Other Ambulatory Visit: Payer: Self-pay

## 2022-03-07 ENCOUNTER — Ambulatory Visit: Payer: Self-pay | Admitting: Pharmacist

## 2022-04-03 ENCOUNTER — Ambulatory Visit: Payer: Self-pay | Admitting: Pharmacist

## 2022-06-08 ENCOUNTER — Ambulatory Visit: Payer: Self-pay | Admitting: Internal Medicine

## 2022-07-11 ENCOUNTER — Emergency Department (HOSPITAL_COMMUNITY): Payer: Self-pay

## 2022-07-11 ENCOUNTER — Inpatient Hospital Stay (HOSPITAL_COMMUNITY)
Admission: EM | Admit: 2022-07-11 | Discharge: 2022-07-17 | DRG: 896 | Disposition: A | Discharge status: Home / Self Care | Payer: Self-pay | Attending: Internal Medicine | Admitting: Internal Medicine

## 2022-07-11 ENCOUNTER — Encounter (HOSPITAL_COMMUNITY): Payer: Self-pay | Admitting: Oncology

## 2022-07-11 ENCOUNTER — Other Ambulatory Visit: Payer: Self-pay

## 2022-07-11 DIAGNOSIS — J9622 Acute and chronic respiratory failure with hypercapnia: Secondary | ICD-10-CM | POA: Diagnosis present

## 2022-07-11 DIAGNOSIS — G312 Degeneration of nervous system due to alcohol: Secondary | ICD-10-CM | POA: Diagnosis present

## 2022-07-11 DIAGNOSIS — E860 Dehydration: Secondary | ICD-10-CM | POA: Diagnosis present

## 2022-07-11 DIAGNOSIS — R4182 Altered mental status, unspecified: Principal | ICD-10-CM

## 2022-07-11 DIAGNOSIS — I959 Hypotension, unspecified: Secondary | ICD-10-CM | POA: Diagnosis present

## 2022-07-11 DIAGNOSIS — R7401 Elevation of levels of liver transaminase levels: Secondary | ICD-10-CM

## 2022-07-11 DIAGNOSIS — R0689 Other abnormalities of breathing: Secondary | ICD-10-CM

## 2022-07-11 DIAGNOSIS — Y908 Blood alcohol level of 240 mg/100 ml or more: Secondary | ICD-10-CM | POA: Diagnosis present

## 2022-07-11 DIAGNOSIS — Z833 Family history of diabetes mellitus: Secondary | ICD-10-CM

## 2022-07-11 DIAGNOSIS — F17210 Nicotine dependence, cigarettes, uncomplicated: Secondary | ICD-10-CM | POA: Diagnosis present

## 2022-07-11 DIAGNOSIS — Z7984 Long term (current) use of oral hypoglycemic drugs: Secondary | ICD-10-CM

## 2022-07-11 DIAGNOSIS — J441 Chronic obstructive pulmonary disease with (acute) exacerbation: Secondary | ICD-10-CM | POA: Diagnosis present

## 2022-07-11 DIAGNOSIS — F10139 Alcohol abuse with withdrawal, unspecified: Secondary | ICD-10-CM | POA: Diagnosis present

## 2022-07-11 DIAGNOSIS — E872 Acidosis, unspecified: Secondary | ICD-10-CM | POA: Diagnosis not present

## 2022-07-11 DIAGNOSIS — Z681 Body mass index (BMI) 19 or less, adult: Secondary | ICD-10-CM

## 2022-07-11 DIAGNOSIS — Z20822 Contact with and (suspected) exposure to covid-19: Secondary | ICD-10-CM | POA: Diagnosis present

## 2022-07-11 DIAGNOSIS — I10 Essential (primary) hypertension: Secondary | ICD-10-CM | POA: Diagnosis present

## 2022-07-11 DIAGNOSIS — R64 Cachexia: Secondary | ICD-10-CM | POA: Diagnosis present

## 2022-07-11 DIAGNOSIS — G9340 Encephalopathy, unspecified: Secondary | ICD-10-CM

## 2022-07-11 DIAGNOSIS — R7981 Abnormal blood-gas level: Secondary | ICD-10-CM

## 2022-07-11 DIAGNOSIS — E1165 Type 2 diabetes mellitus with hyperglycemia: Secondary | ICD-10-CM | POA: Diagnosis present

## 2022-07-11 DIAGNOSIS — Z79899 Other long term (current) drug therapy: Secondary | ICD-10-CM

## 2022-07-11 DIAGNOSIS — J449 Chronic obstructive pulmonary disease, unspecified: Secondary | ICD-10-CM | POA: Diagnosis present

## 2022-07-11 DIAGNOSIS — J9621 Acute and chronic respiratory failure with hypoxia: Secondary | ICD-10-CM | POA: Diagnosis present

## 2022-07-11 DIAGNOSIS — F10129 Alcohol abuse with intoxication, unspecified: Principal | ICD-10-CM

## 2022-07-11 DIAGNOSIS — F1010 Alcohol abuse, uncomplicated: Secondary | ICD-10-CM | POA: Diagnosis present

## 2022-07-11 LAB — BLOOD GAS, VENOUS
Acid-Base Excess: 7.4 mmol/L — ABNORMAL HIGH (ref 0.0–2.0)
Acid-Base Excess: 3.2 mmol/L — ABNORMAL HIGH (ref 0.0–2.0)
Bicarbonate: 34.7 mmol/L — ABNORMAL HIGH (ref 20.0–28.0)
Bicarbonate: 31.7 mmol/L — ABNORMAL HIGH (ref 20.0–28.0)
O2 Saturation: 68.1 %
O2 Saturation: 96.5 %
Patient temperature: 37
Patient temperature: 37
pCO2, Ven: 60 mmHg (ref 44–60)
pCO2, Ven: 66 mmHg — ABNORMAL HIGH (ref 44–60)
pH, Ven: 7.37 (ref 7.25–7.43)
pH, Ven: 7.29 (ref 7.25–7.43)
pO2, Ven: 48 mmHg — ABNORMAL HIGH (ref 32–45)
pO2, Ven: 87 mmHg — ABNORMAL HIGH (ref 32–45)

## 2022-07-11 LAB — CBC WITH DIFFERENTIAL/PLATELET
Abs Immature Granulocytes: 0.02 10*3/uL (ref 0.00–0.07)
Basophils Absolute: 0 10*3/uL (ref 0.0–0.1)
Basophils Relative: 1 %
Eosinophils Absolute: 0 10*3/uL (ref 0.0–0.5)
Eosinophils Relative: 0 %
HCT: 38.1 % — ABNORMAL LOW (ref 39.0–52.0)
Hemoglobin: 12 g/dL — ABNORMAL LOW (ref 13.0–17.0)
Immature Granulocytes: 1 %
Lymphocytes Relative: 21 %
Lymphs Abs: 0.8 10*3/uL (ref 0.7–4.0)
MCH: 26.6 pg (ref 26.0–34.0)
MCHC: 31.5 g/dL (ref 30.0–36.0)
MCV: 84.5 fL (ref 80.0–100.0)
Monocytes Absolute: 0.2 10*3/uL (ref 0.1–1.0)
Monocytes Relative: 6 %
Neutro Abs: 2.6 10*3/uL (ref 1.7–7.7)
Neutrophils Relative %: 71 %
Platelets: 268 10*3/uL (ref 150–400)
RBC: 4.51 MIL/uL (ref 4.22–5.81)
RDW: 25.5 % — ABNORMAL HIGH (ref 11.5–15.5)
WBC: 3.7 10*3/uL — ABNORMAL LOW (ref 4.0–10.5)
nRBC: 0 % (ref 0.0–0.2)

## 2022-07-11 LAB — BLOOD GAS, ARTERIAL
Acid-Base Excess: 4 mmol/L — ABNORMAL HIGH (ref 0.0–2.0)
Bicarbonate: 30.6 mmol/L — ABNORMAL HIGH (ref 20.0–28.0)
Drawn by: 23532
O2 Saturation: 99.3 %
Patient temperature: 37
pCO2 arterial: 53 mmHg — ABNORMAL HIGH (ref 32–48)
pH, Arterial: 7.37 (ref 7.35–7.45)
pO2, Arterial: 140 mmHg — ABNORMAL HIGH (ref 83–108)

## 2022-07-11 LAB — URINALYSIS, ROUTINE W REFLEX MICROSCOPIC
Bacteria, UA: NONE SEEN
Bilirubin Urine: NEGATIVE
Glucose, UA: NEGATIVE mg/dL
Ketones, ur: 5 mg/dL — AB
Leukocytes,Ua: NEGATIVE
Nitrite: NEGATIVE
Protein, ur: NEGATIVE mg/dL
Specific Gravity, Urine: 1.01 (ref 1.005–1.030)
pH: 5 (ref 5.0–8.0)

## 2022-07-11 LAB — TSH: TSH: 0.608 u[IU]/mL (ref 0.350–4.500)

## 2022-07-11 LAB — COMPREHENSIVE METABOLIC PANEL
ALT: 46 U/L — ABNORMAL HIGH (ref 0–44)
AST: 60 U/L — ABNORMAL HIGH (ref 15–41)
Albumin: 4 g/dL (ref 3.5–5.0)
Alkaline Phosphatase: 120 U/L (ref 38–126)
Anion gap: 14 (ref 5–15)
BUN: 17 mg/dL (ref 8–23)
CO2: 29 mmol/L (ref 22–32)
Calcium: 7.9 mg/dL — ABNORMAL LOW (ref 8.9–10.3)
Chloride: 95 mmol/L — ABNORMAL LOW (ref 98–111)
Creatinine, Ser: 0.47 mg/dL — ABNORMAL LOW (ref 0.61–1.24)
GFR, Estimated: >60 mL/min (ref >60)
Glucose, Bld: 96 mg/dL (ref 70–99)
Potassium: 4.2 mmol/L (ref 3.5–5.1)
Sodium: 138 mmol/L (ref 135–145)
Total Bilirubin: 1 mg/dL (ref 0.3–1.2)
Total Protein: 7.1 g/dL (ref 6.5–8.1)

## 2022-07-11 LAB — SALICYLATE LEVEL: Salicylate Lvl: <7 mg/dL — ABNORMAL LOW (ref 7.0–30.0)

## 2022-07-11 LAB — LACTIC ACID, PLASMA
Lactic Acid, Venous: 2.2 mmol/L (ref 0.5–1.9)
Lactic Acid, Venous: 2.4 mmol/L (ref 0.5–1.9)

## 2022-07-11 LAB — ACETAMINOPHEN LEVEL: Acetaminophen (Tylenol), Serum: <10 ug/mL — ABNORMAL LOW (ref 10–30)

## 2022-07-11 LAB — RESP PANEL BY RT-PCR (FLU A&B, COVID) ARPGX2
Influenza A by PCR: NEGATIVE
Influenza B by PCR: NEGATIVE
SARS Coronavirus 2 by RT PCR: NEGATIVE

## 2022-07-11 LAB — RAPID URINE DRUG SCREEN, HOSP PERFORMED
Amphetamines: NOT DETECTED
Barbiturates: NOT DETECTED
Benzodiazepines: NOT DETECTED
Cocaine: NOT DETECTED
Opiates: NOT DETECTED
Tetrahydrocannabinol: NOT DETECTED

## 2022-07-11 LAB — PROTIME-INR
INR: 1 (ref 0.8–1.2)
Prothrombin Time: 13.3 seconds (ref 11.4–15.2)

## 2022-07-11 LAB — LIPASE, BLOOD: Lipase: 19 U/L (ref 11–51)

## 2022-07-11 LAB — CBG MONITORING, ED
Glucose-Capillary: 90 mg/dL (ref 70–99)
Glucose-Capillary: 100 mg/dL — ABNORMAL HIGH (ref 70–99)

## 2022-07-11 LAB — APTT: aPTT: 29 seconds (ref 24–36)

## 2022-07-11 LAB — ETHANOL: Alcohol, Ethyl (B): 524 mg/dL (ref <10)

## 2022-07-11 LAB — TROPONIN I (HIGH SENSITIVITY): Troponin I (High Sensitivity): 6 ng/L (ref <18)

## 2022-07-11 LAB — MAGNESIUM: Magnesium: 1.8 mg/dL (ref 1.7–2.4)

## 2022-07-11 LAB — AMMONIA: Ammonia: 17 umol/L (ref 9–35)

## 2022-07-11 MED ORDER — ACETAMINOPHEN 325 MG PO TABS: 650.0000 mg | ORAL_TABLET | Freq: Four times a day (QID) | ORAL | Status: DC | PRN

## 2022-07-11 MED ORDER — IPRATROPIUM-ALBUTEROL 0.5-2.5 (3) MG/3ML IN SOLN: 3.0000 mL | Freq: Once | RESPIRATORY_TRACT | Status: DC

## 2022-07-11 MED ORDER — CEFEPIME HCL 2 G IV SOLR
2.0000 g | Freq: Once | INTRAVENOUS | Status: AC
  Administered 2022-07-11: 2 g via INTRAVENOUS
  Filled 2022-07-11: qty 12.5

## 2022-07-11 MED ORDER — ACETAMINOPHEN 650 MG RE SUPP: 650.0000 mg | Freq: Four times a day (QID) | RECTAL | Status: DC | PRN

## 2022-07-11 MED ORDER — FLUMAZENIL 0.5 MG/5ML IV SOLN
0.2000 mg | Freq: Once | INTRAVENOUS | Status: AC
  Administered 2022-07-11: 0.2 mg via INTRAVENOUS
  Filled 2022-07-11: qty 5

## 2022-07-11 MED ORDER — LACTATED RINGERS IV BOLUS
1000.0000 mL | Freq: Once | INTRAVENOUS | Status: AC
  Administered 2022-07-11: 1000 mL via INTRAVENOUS

## 2022-07-11 MED ORDER — POLYETHYLENE GLYCOL 3350 17 G PO PACK: 17.0000 g | PACK | Freq: Every day | ORAL | Status: DC | PRN

## 2022-07-11 MED ORDER — FOLIC ACID 1 MG PO TABS: 1.0000 mg | ORAL_TABLET | Freq: Once | ORAL | Status: DC

## 2022-07-11 MED ORDER — FLUMAZENIL 0.5 MG/5ML IV SOLN: 0.3000 mg | Freq: Once | INTRAVENOUS | Status: DC

## 2022-07-11 MED ORDER — IPRATROPIUM-ALBUTEROL 0.5-2.5 (3) MG/3ML IN SOLN: 3.0000 mL | RESPIRATORY_TRACT | Status: DC | PRN

## 2022-07-11 MED ORDER — INSULIN ASPART 100 UNIT/ML IJ SOLN
0.0000 [IU] | INTRAMUSCULAR | Status: DC
  Administered 2022-07-12: 2 [IU] via SUBCUTANEOUS
  Administered 2022-07-12: 7 [IU] via SUBCUTANEOUS
  Administered 2022-07-12: 5 [IU] via SUBCUTANEOUS
  Administered 2022-07-12: 3 [IU] via SUBCUTANEOUS
  Administered 2022-07-13 (×2): 2 [IU] via SUBCUTANEOUS
  Administered 2022-07-13: 1 [IU] via SUBCUTANEOUS
  Administered 2022-07-13 (×3): 3 [IU] via SUBCUTANEOUS
  Administered 2022-07-14: 1 [IU] via SUBCUTANEOUS
  Administered 2022-07-14: 2 [IU] via SUBCUTANEOUS
  Administered 2022-07-14 (×3): 3 [IU] via SUBCUTANEOUS
  Administered 2022-07-15 (×2): 2 [IU] via SUBCUTANEOUS
  Administered 2022-07-15: 3 [IU] via SUBCUTANEOUS
  Administered 2022-07-15: 5 [IU] via SUBCUTANEOUS
  Administered 2022-07-15: 7 [IU] via SUBCUTANEOUS
  Administered 2022-07-15: 1 [IU] via SUBCUTANEOUS
  Administered 2022-07-16: 3 [IU] via SUBCUTANEOUS
  Administered 2022-07-16: 2 [IU] via SUBCUTANEOUS
  Filled 2022-07-11: qty 0.09

## 2022-07-11 MED ORDER — ENOXAPARIN SODIUM 40 MG/0.4ML IJ SOSY
40.0000 mg | PREFILLED_SYRINGE | INTRAMUSCULAR | Status: DC
  Administered 2022-07-13 – 2022-07-17 (×5): 40 mg via SUBCUTANEOUS
  Filled 2022-07-11 (×6): qty 0.4

## 2022-07-11 MED ORDER — LORAZEPAM 2 MG/ML IJ SOLN
2.0000 mg | Freq: Once | INTRAMUSCULAR | Status: AC
  Administered 2022-07-11: 2 mg via INTRAVENOUS
  Filled 2022-07-11: qty 1

## 2022-07-11 MED ORDER — VITAMIN B-12 1000 MCG PO TABS
1000.0000 ug | ORAL_TABLET | Freq: Every day | ORAL | Status: DC
  Administered 2022-07-12 – 2022-07-17 (×6): 1000 ug via ORAL
  Filled 2022-07-11 (×6): qty 1

## 2022-07-11 MED ORDER — LACTATED RINGERS IV SOLN: INTRAVENOUS | Status: AC

## 2022-07-11 MED ORDER — FOLIC ACID 1 MG PO TABS
1.0000 mg | ORAL_TABLET | Freq: Every day | ORAL | Status: DC
  Administered 2022-07-12 – 2022-07-17 (×6): 1 mg via ORAL
  Filled 2022-07-11 (×6): qty 1

## 2022-07-11 MED ORDER — THIAMINE HCL 100 MG/ML IJ SOLN
100.0000 mg | Freq: Once | INTRAMUSCULAR | Status: AC
  Administered 2022-07-11: 100 mg via INTRAVENOUS
  Filled 2022-07-11: qty 2

## 2022-07-11 MED ORDER — THIAMINE HCL 100 MG/ML IJ SOLN
100.0000 mg | Freq: Every day | INTRAMUSCULAR | Status: DC
  Administered 2022-07-12: 100 mg via INTRAVENOUS
  Filled 2022-07-11: qty 2

## 2022-07-11 MED ORDER — ADULT MULTIVITAMIN W/MINERALS CH
1.0000 | ORAL_TABLET | Freq: Every day | ORAL | Status: DC
  Administered 2022-07-12 – 2022-07-17 (×6): 1 via ORAL
  Filled 2022-07-11 (×6): qty 1

## 2022-07-11 MED ORDER — SODIUM CHLORIDE 0.9% FLUSH
3.0000 mL | Freq: Two times a day (BID) | INTRAVENOUS | Status: DC
  Administered 2022-07-12 – 2022-07-17 (×11): 3 mL via INTRAVENOUS

## 2022-07-11 MED ORDER — IPRATROPIUM-ALBUTEROL 0.5-2.5 (3) MG/3ML IN SOLN
6.0000 mL | Freq: Once | RESPIRATORY_TRACT | Status: AC
  Administered 2022-07-11: 6 mL via RESPIRATORY_TRACT
  Filled 2022-07-11: qty 3

## 2022-07-11 MED ORDER — FERROUS SULFATE 325 (65 FE) MG PO TABS
325.0000 mg | ORAL_TABLET | Freq: Every day | ORAL | Status: DC
  Administered 2022-07-12 – 2022-07-17 (×6): 325 mg via ORAL
  Filled 2022-07-11 (×6): qty 1

## 2022-07-11 MED ORDER — THIAMINE MONONITRATE 100 MG PO TABS
100.0000 mg | ORAL_TABLET | Freq: Every day | ORAL | Status: DC
  Administered 2022-07-13 – 2022-07-17 (×5): 100 mg via ORAL
  Filled 2022-07-11 (×7): qty 1

## 2022-07-11 NOTE — ED Notes (Signed)
Mitts placed on pt to keep pt from pulling on cords.

## 2022-07-11 NOTE — ED Triage Notes (Signed)
Pt bib [facility] from home d/t AMS. Per EMS upon arrival pt was responding only to pain. Pt's home had empty beer cans laying around. 510 ml's NS given en route. Pt is cachectic, opening his eyes at this time. Per EMS pt's mother took him to the store yesterday and he was in his normal state of health.   VS 110/70 80 98

## 2022-07-11 NOTE — ED Notes (Signed)
Patient is alert and oriented to his name, location, DOB. Patient is more verbal after receiving medication.

## 2022-07-11 NOTE — ED Notes (Signed)
Pt took home nighttime medications that he brought with him per ED MD

## 2022-07-11 NOTE — ED Notes (Signed)
Called RT for Bipap

## 2022-07-11 NOTE — ED Notes (Signed)
Patient appears tired and groggy but will answer questions concerning his name and location. Patient follows commands and is cooperative when obtaining vitals.

## 2022-07-11 NOTE — ED Notes (Signed)
Patient is back from CT. Patient is on 6L nasal cannula.

## 2022-07-11 NOTE — ED Provider Notes (Signed)
[facility] DEPT Provider Note   CSN: [id number] Arrival date & time: 07/11/22  1549     History  Chief Complaint  Patient presents with   Altered Mental Status    [name] is a 61 y.o. male.  With PMH of COPD, alcohol abuse, HTN, DM2 brought in by EMS from home for altered mental status after being found by his mother.  Of note, he was surrounded by multiple beer cans.  He was given 500 mL normal saline from EMS in route.  Initial blood glucose 90 in ED.  According to EMS, there was no fever or sepsis criteria.  He was noted to be cachectic.  There was no vomiting or diarrhea and there was no evidence of head trauma or other injuries.  According to his mother he was at the store with her yesterday in his normal state of health which is typically alert and oriented x4.  His mother gave me further information.  She notes he [place] by himself [place].  He has been a chronic drinker for many years and will drink beer, liquor or any alcohol he can get his hands on but no history of toxic alcohols.  She notes he has been on a steady decline over the past month or so.  She notes he has been having increasing generalized weakness and was unable to even lift the grocery bags yesterday.  There has been no focal weakness or recent injuries or head trauma.  She does not remember the last time he stopped drinking.  He has had no hallucinations but just seems off and unsteady often on his feet.   Altered Mental Status      Home Medications Prior to Admission medications   Medication Sig Start Date End Date Taking? Authorizing Provider  aspirin EC 81 MG tablet Take 1 tablet (81 mg total) by mouth daily. Patient not taking: No sig reported 04/06/17   Alfonse Spruce, FNP  atorvastatin (LIPITOR) 10 MG tablet Take 1 tablet (10 mg total) by mouth daily. 02/07/22   Ladell Pier, MD  Blood Glucose Monitoring Suppl (TRUE METRIX AIR GLUCOSE  METER) w/Device KIT 1 kit by Does not apply route 2 (two) times daily at 8 am and 10 pm. 09/17/18   Fulp, Cammie, MD  ferrous sulfate 325 (65 FE) MG tablet Take 1 tablet (325 mg total) by mouth daily with breakfast. 02/15/22   Ladell Pier, MD  glimepiride (AMARYL) 2 MG tablet Take 1 tablet (2 mg total) by mouth 2 (two) times daily. 02/07/22   Ladell Pier, MD  glucose blood (TRUE METRIX BLOOD GLUCOSE TEST) test strip Use as instructed-fasting before breakfast; 2 hours after largest meal of the day and before bedtime 09/17/18   Fulp, Cammie, MD  metFORMIN (GLUCOPHAGE) 1000 MG tablet Take 1 tablet (1,000 mg total) by mouth 2 (two) times daily with a meal. 02/07/22   Ladell Pier, MD  TRUEPLUS LANCETS 28G MISC Use when checking blood sugar fasting, at bedtime and about 2 hours after largest meal of the day 09/17/18   Fulp, Cammie, MD  vitamin B-12 (CYANOCOBALAMIN) 1000 MCG tablet Take 1 tablet (1,000 mcg total) by mouth daily. 02/15/22   Ladell Pier, MD      Allergies    Patient has no known allergies.    Review of Systems   Review of Systems  Physical Exam Updated Vital Signs BP 100/69   Pulse 76  Temp (!) 97.3 F (36.3 C) (Oral)   Resp 13   SpO2 99%  Physical Exam Constitutional: Cachectic and chronically ill in appearance.  Alert and oriented to self.  Encephalopathic, GCS 14. Eyes: Conjunctivae are normal. [id number] ENT      Head: Normocephalic and atraumatic.      Nose: No congestion.      Mouth/Throat: Mucous membranes are moist.      Neck: No stridor.  No meningismus. Cardiovascular: S1, S2,  Normal and symmetric distal pulses are present in all extremities.Warm and well perfused. Respiratory: Clear breath sounds, O2 sat 89 on RA.  No increased work of breathing. Gastrointestinal: Soft and nontender.  Thin.   Musculoskeletal:  No edema.  Thin cachectic extremities.  Abrasion overlying left knee. Neurologic: GCS 14, moving arms himself against gravity,  intermittently rolling legs on bed but will not lift them against gravity.  Following some commands.  Oriented to self, encephalopathic. Skin: Skin is cool, dry Psychiatric: encephalopathic  ED Results / Procedures / Treatments   Labs (all labs ordered are listed, but only abnormal results are displayed) Labs Reviewed  LACTIC ACID, PLASMA - Abnormal; Notable for the following components:      Result Value   Lactic Acid, Venous 2.2 (*)    All other components within normal limits  LACTIC ACID, PLASMA - Abnormal; Notable for the following components:   Lactic Acid, Venous 2.4 (*)    All other components within normal limits  COMPREHENSIVE METABOLIC PANEL - Abnormal; Notable for the following components:   Chloride 95 (*)    Creatinine, Ser 0.47 (*)    Calcium 7.9 (*)    AST 60 (*)    ALT 46 (*)    All other components within normal limits  CBC WITH DIFFERENTIAL/PLATELET - Abnormal; Notable for the following components:   WBC 3.7 (*)    Hemoglobin 12.0 (*)    HCT 38.1 (*)    RDW 25.5 (*)    All other components within normal limits  URINALYSIS, ROUTINE W REFLEX MICROSCOPIC - Abnormal; Notable for the following components:   Hgb urine dipstick MODERATE (*)    Ketones, ur 5 (*)    All other components within normal limits  BLOOD GAS, VENOUS - Abnormal; Notable for the following components:   pO2, Ven 48 (*)    Bicarbonate 34.7 (*)    Acid-Base Excess 7.4 (*)    All other components within normal limits  ETHANOL - Abnormal; Notable for the following components:   Alcohol, Ethyl (B) 524 (*)    All other components within normal limits  ACETAMINOPHEN LEVEL - Abnormal; Notable for the following components:   Acetaminophen (Tylenol), Serum <10 (*)    All other components within normal limits  SALICYLATE LEVEL - Abnormal; Notable for the following components:   Salicylate Lvl <4.4 (*)    All other components within normal limits  BLOOD GAS, ARTERIAL - Abnormal; Notable for the  following components:   pCO2 arterial 53 (*)    pO2, Arterial 140 (*)    Bicarbonate 30.6 (*)    Acid-Base Excess 4.0 (*)    All other components within normal limits  BLOOD GAS, VENOUS - Abnormal; Notable for the following components:   pCO2, Ven 66 (*)    pO2, Ven 87 (*)    Bicarbonate 31.7 (*)    Acid-Base Excess 3.2 (*)    All other components within normal limits  CBG MONITORING, ED - Abnormal; Notable for the following  components:   Glucose-Capillary 100 (*)    All other components within normal limits  RESP PANEL BY RT-PCR (FLU A&B, COVID) ARPGX2  CULTURE, BLOOD (ROUTINE X 2)  CULTURE, BLOOD (ROUTINE X 2)  URINE CULTURE  PROTIME-INR  APTT  LIPASE, BLOOD  TSH  AMMONIA  MAGNESIUM  RAPID URINE DRUG SCREEN, HOSP PERFORMED  COMPREHENSIVE METABOLIC PANEL  CBC  CBG MONITORING, ED  TROPONIN I (HIGH SENSITIVITY)    EKG EKG Interpretation  Date/Time:  Tuesday July 11 2022 15:58:56 EDT Ventricular Rate:  80 PR Interval:  177 QRS Duration: 88 QT Interval:  413 QTC Calculation: 477 R Axis:   102 Text Interpretation: Sinus rhythm Right axis deviation Nonspecific T abnrm, anterolateral leads Borderline prolonged QT interval T wave inversion avL No significant change since last tracing QTc 477 Confirmed by Georgina Snell [phone]) on 07/11/2022 4:35:16 PM  Radiology CT Cervical Spine Wo Contrast  Result Date: 07/11/2022 CLINICAL DATA:  Altered mental status. EXAM: CT CERVICAL SPINE WITHOUT CONTRAST TECHNIQUE: Multidetector CT imaging of the cervical spine was performed without intravenous contrast. Multiplanar CT image reconstructions were also generated. RADIATION DOSE REDUCTION: This exam was performed according to the departmental dose-optimization program which includes automated exposure control, adjustment of the mA and/or kV according to patient size and/or use of iterative reconstruction technique. COMPARISON:  None Available. FINDINGS: Alignment: Normal. Skull base  and vertebrae: No acute fracture. No primary bone lesion or focal pathologic process. Soft tissues and spinal canal: No prevertebral fluid or swelling. No visible canal hematoma. Disc levels: There is disc space narrowing and endplate osteophyte formation throughout the cervical spine compatible with degenerative change. Neural foraminal stenosis is seen bilaterally at C3-C4, C5-C6 and C6-C7. There is mild central canal stenosis at these levels secondary to disc osteophyte complexes. Upper chest: Negative. Other: Examination is mildly technically limited secondary to motion artifact. IMPRESSION: 1. No acute fracture or traumatic subluxation of the cervical spine. 2.  Moderate degenerative changes. Electronically Signed   By: Ronney Asters M.D.   On: 07/11/2022 20:01   CT Head Wo Contrast  Result Date: 07/11/2022 CLINICAL DATA:  ams EXAM: CT HEAD WITHOUT CONTRAST TECHNIQUE: Contiguous axial images were obtained from the base of the skull through the vertex without intravenous contrast. RADIATION DOSE REDUCTION: This exam was performed according to the departmental dose-optimization program which includes automated exposure control, adjustment of the mA and/or kV according to patient size and/or use of iterative reconstruction technique. COMPARISON:  None Available. BRAIN: BRAIN Cerebral ventricle sizes are concordant with the degree of cerebral volume loss. Patchy and confluent areas of decreased attenuation are noted throughout the deep and periventricular white matter of the cerebral hemispheres bilaterally, compatible with chronic microvascular ischemic disease. No evidence of large-territorial acute infarction. No parenchymal hemorrhage. No mass lesion. No extra-axial collection. No mass effect or midline shift. No hydrocephalus. Basilar cisterns are patent. Vascular: No hyperdense vessel. Atherosclerotic calcifications are present within the cavernous internal carotid and vertebral arteries. Skull: No acute  fracture or focal lesion. Sinuses/Orbits: Paranasal sinuses and mastoid air cells are clear. The orbits are unremarkable. Other: Tube noted coursing within the right nares and tip overlying the hypopharynx on scalp lateral view. IMPRESSION: No acute intracranial abnormality. Electronically Signed   By: Iven Finn M.D.   On: 07/11/2022 20:01   DG Chest Port 1 View  Result Date: 07/11/2022 CLINICAL DATA:  Possible sepsis. EXAM: PORTABLE CHEST 1 VIEW COMPARISON:  11/04/2013 radiograph FINDINGS: The cardiomediastinal silhouette is unremarkable. There is  no evidence of focal airspace disease, pulmonary edema, suspicious pulmonary nodule/mass, pleural effusion, or pneumothorax. No acute bony abnormalities are identified. IMPRESSION: No active disease. Electronically Signed   By: Margarette Canada M.D.   On: 07/11/2022 16:46    Procedures .Critical Care  Performed by: Elgie Congo, MD Authorized by: Elgie Congo, MD   Critical care provider statement:    Critical care time (minutes):  70   Critical care was necessary to treat or prevent imminent or life-threatening deterioration of the following conditions:  Respiratory failure   Critical care was time spent personally by me on the following activities:  Development of treatment plan with patient or surrogate, discussions with consultants, evaluation of patient's response to treatment, examination of patient, ordering and review of laboratory studies, ordering and review of radiographic studies, ordering and performing treatments and interventions, pulse oximetry, re-evaluation of patient's condition, review of old charts and obtaining history from patient or surrogate   Care discussed with: admitting provider     Remained constant cardiac monitoring normal sinus rhythm  Medications Ordered in ED Medications  folic acid (FOLVITE) tablet 1 mg (1 mg Oral Not Given 07/11/22 1706)  cyanocobalamin (VITAMIN B12) tablet 1,000 mcg (has no  administration in time range)  ferrous sulfate tablet 325 mg (has no administration in time range)  enoxaparin (LOVENOX) injection 40 mg (has no administration in time range)  sodium chloride flush (NS) 0.9 % injection 3 mL (has no administration in time range)  acetaminophen (TYLENOL) tablet 650 mg (has no administration in time range)    Or  acetaminophen (TYLENOL) suppository 650 mg (has no administration in time range)  polyethylene glycol (MIRALAX / GLYCOLAX) packet 17 g (has no administration in time range)  lactated ringers infusion (has no administration in time range)  lactated ringers bolus 1,000 mL (0 mLs Intravenous Stopped 07/11/22 1735)  thiamine (VITAMIN B1) injection 100 mg (100 mg Intravenous Given 07/11/22 1719)  ipratropium-albuterol (DUONEB) 0.5-2.5 (3) MG/3ML nebulizer solution 6 mL (6 mLs Nebulization Given 07/11/22 1719)  LORazepam (ATIVAN) injection 2 mg (2 mg Intravenous Given 07/11/22 1812)  lactated ringers bolus 1,000 mL (0 mLs Intravenous Stopped 07/11/22 2203)  flumazenil (ROMAZICON) injection 0.2 mg (0.2 mg Intravenous Given 07/11/22 2241)  ceFEPIme (MAXIPIME) 2 g in sodium chloride 0.9 % 100 mL IVPB (2 g Intravenous New Bag/Given 07/11/22 2256)    ED Course/ Medical Decision Making/ A&P Clinical Course as of 07/11/22 2331  Tue Jul 11, 2022  1649 Personal interpretation of chest x-ray shows no consolidation concerning for pneumonia, no pulmonary edema, no pleural effusion.  Agree with radiology read no acute abnormality. [VB]  3154 VBG with normal pH 7.37, no hypercarbia PCO2 60.  PO2 48. [VB]  0086 Alcohol, Ethyl (B)(!!): 524 [VB]  1837 Patient was agitated and unable to tolerate the CT scanner.  He was given IV Ativan 2 mg and had a hypoxic event to the 60s was able to be brought up with nonrebreather and NPA placed.  He is currently on 5 L nasal cannula with NPA in place still able to fight and agitated with NPA placement.  His alcohol level is 524 consistent with  his presentation. Keeping on end-tidal monitoring we will repeat ABG in 30 minutes to 1 hour to ensure no hypercarbia or retention.  Lactate 2.2 likely from alcohol use.  Normal ammonia normal TSH. [VB]  2000 Repeat ABG with stable pH 7.37, PCO2 53 down trended from previous VBG and PO2 140.  Maintaining normal End tidal monitoring on 6L [place].  Holding off from intubation with no worsening of status. [VB]  2027 Patient still clinically intoxicated but will now wake up and speak to me and follow commands. He was noted to be hypothermic on rectal temperature, ordered for Bair hugger and warmed IV fluids.  UA still pending.   [VB]  2215 Patient has been taken off oxygen and is satting appropriately on room air with normal end tidal in 40s.  Repeat VBG in process as well as repeat lactate which is suspect is due to chronic alcoholism and dehydration.  UA is pending.  He is still quite somnolent.  CT head was unremarkable.  Spoke with pharmacist, no history of seizures no history of benzo dependence, likely low risk for flumazenil we will attempt flumazenil to also reverse Ativan due to long half-life of 12 hours [VB]  2256 Patient had significant improvement with flumazenil and is now awake alert and talking no longer somnolent.  He is following commands.  He is on 3L mouthpiece, with appropriate saturations, repeat lactate up trended I do not think related to sepsis but with his hypothermia and hypotension ordered for dose of cefepime.  No UTI and UA.  UDS negative.  Discussed case with intensivist who agrees he can be appropriate for stepdown for monitoring at this point since he woke up with flumazenil. Will start bipap and admit.. [VB]  2331 Patient's case discussed with hospitalist who will admit patient to stepdown for further work-up and management hypoxic hypercarbic respiratory failure secondary to acute intoxication. [VB]    Clinical Course User Index [VB] Elgie Congo, MD                            Medical Decision Making LIONAL ICENOGLE is a 61 y.o. male.  With PMH of COPD, alcohol abuse, HTN, DM2 brought in by EMS from home for altered mental status after being found by his mother.  Of note, he was surrounded by multiple beer cans.  He was given 500 mL normal saline from EMS in route.  Initial blood glucose 90 in ED.  The patient presents with altered mental status from baseline. The differential for altered mental status is broad and includes, but is not limited to: electrolyte abnormalities (such as hypoNa), thyroid abnormalities, hypercarbia, hypoxic encephalopathy, CVA/ICH, infection, hepatic encephalopathy, hyper-uremia, ischemia, atypical ACS, toxins,  infection such as UTI or pneumonia.  Unlikely infection such as encephalitis and meningitis,  which seem less likely on initial presentation given no fever or meningismus or problems ranging neck.  Based on the patient's presentation, I suspect most likely intoxication since he is known chronic drinker and surrounded by multiple beer cans as well as likely underlying electrolyte abnormalities due to his cachectic appearance and likely beer [place] mania.  Also consider Wernickes due to chronic drinking and encephalopathy. Started on fluids and Thiamine IV.  Initial BG 90.   Also concern for underlying infection, O2 sat 89% with mild hypotension (likely hypovolemic in nature) 89/60 but no tachycardia or increased work of breathing.  Starting sepsis work-up but holding off on antibiotics until source determined or present.  Plan to obtain broad workup including broad labs, cultures, VBG, UA, CXR, EKG/troponin, ammonia, Mg, CTH without contrast, CT C spine without contrast.  Disposition pending work-up and reassessment.   Amount and/or Complexity of Data Reviewed Labs: ordered. Decision-making details documented in ED Course. Radiology: ordered and independent  interpretation performed. Decision-making details documented in ED  Course. ECG/medicine tests: ordered and independent interpretation performed. Decision-making details documented in ED Course.  Risk Prescription drug management. Decision regarding hospitalization.    Final Clinical Impression(s) / ED Diagnoses Final diagnoses:  Altered mental status, unspecified altered mental status type  Low oxygen saturation  Transaminitis  Alcohol abuse with intoxication ([place])  Hypothermia of newborn  Hypercarbia    Rx / DC Orders ED Discharge Orders     None         Elgie Congo, MD 07/11/22 [phone]

## 2022-07-11 NOTE — H&P (Signed)
History and Physical   [name] BZJ:[id number] DOB: 1961/05/05 DOA: 07/11/2022  PCP: Ladell Pier, MD   Patient coming from: Home  Chief Complaint: Altered mental status  HPI: [name] is a 61 y.o. male with medical history significant of alcohol abuse, COPD, pancreatitis, hypertension, diabetes presenting with altered mental status.  Patient was found by his mother at home altered and EMS was called.  On EMS arrival patient responding to pain only.  Noted has large quantity of empty beer cans around him.  Received 500 cc in route.  History obtained with assistance of mother and chart review.  Patient was last normal yesterday.  He is a chronic alcohol user and will drink heavily and drinks whenever he can get his hands on in terms of ethanol.  No history of toxic alcohol use.  Mother reports some general decline in his health for the past month with gradual increasing weakness.  She states she does not remember him having alcohol withdrawal however he does not remember him ever actually stopping drinking for significant period time.  No recent reported fevers, chills, chest pain, shortness of breath, constipation, diarrhea, nausea, vomiting, abdominal pain.  Alert and oriented when I saw him, reports he feels "fine".  ED Course: Vital signs in the ED significant for requiring 6 to 8 L to maintain saturations and then placed on BiPAP.  Also some transient hypotension.  Lab work-up included CMP with chloride 95, calcium 7.9, AST 60, ALT 46.  CBC with leukopenia at 3.7.  PT, PTT, INR within normal limits.  Lactic acid mildly elevated at 2.2 and then 2.4.  Lipase normal.  Troponin normal with repeat pending.  TSH normal.  Respiratory panel flu and COVID-negative.  Ethanol level severely elevated to 524.  Tylenol, aspirin, ammonia levels normal.  Urinalysis and UDS without acute abnormality.  Urine culture and blood cultures pending.  VBG initially normal, ABG showed PCO2 of 53  subsequently and then a third study which was a VBG showed normal pH but increasing PCO2 to 66.  Magnesium normal.  Chest x-ray showed no acute abnormality.  CT of the head and CT of the C-spine showed no acute abnormality.  Patient received cefepime, Ativan, folic acid and ultimately flumazenil was given due to worsening mentation after receiving Ativan for CT scan.  After receiving flumazenil his mentation improved.  He also received 2 L of IV fluids, folate, thiamine, DuoNeb treatment.  PCCM was consulted and with his improvement after flumazenil believe he does not need to be in the ICU at this time will be appropriate for stepdown for now.  Review of Systems: As per HPI otherwise all other systems reviewed and are negative.  Past Medical History:  Diagnosis Date   COPD (chronic obstructive pulmonary disease) ([place])    Pancreatitis     Past Surgical History:  Procedure Laterality Date   NO PAST SURGERIES      Social History  reports that he has been smoking cigarettes. He has a 35.00 pack-year smoking history. He has never used smokeless tobacco. He reports current alcohol use. He reports that he does not use drugs.  No Known Allergies  Family History  Problem Relation Age of Onset   Diabetes Sister   Reviewed on admission  Prior to Admission medications   Medication Sig Start Date End Date Taking? Authorizing Provider  aspirin EC 81 MG tablet Take 1 tablet (81 mg total) by mouth daily. Patient not taking: No sig reported 04/06/17  Alfonse Spruce, FNP  atorvastatin (LIPITOR) 10 MG tablet Take 1 tablet (10 mg total) by mouth daily. 02/07/22   Ladell Pier, MD  Blood Glucose Monitoring Suppl (TRUE METRIX AIR GLUCOSE METER) w/Device KIT 1 kit by Does not apply route 2 (two) times daily at 8 am and 10 pm. 09/17/18   Fulp, Cammie, MD  ferrous sulfate 325 (65 FE) MG tablet Take 1 tablet (325 mg total) by mouth daily with breakfast. 02/15/22   Ladell Pier, MD  glimepiride  (AMARYL) 2 MG tablet Take 1 tablet (2 mg total) by mouth 2 (two) times daily. 02/07/22   Ladell Pier, MD  glucose blood (TRUE METRIX BLOOD GLUCOSE TEST) test strip Use as instructed-fasting before breakfast; 2 hours after largest meal of the day and before bedtime 09/17/18   Fulp, Cammie, MD  metFORMIN (GLUCOPHAGE) 1000 MG tablet Take 1 tablet (1,000 mg total) by mouth 2 (two) times daily with a meal. 02/07/22   Ladell Pier, MD  TRUEPLUS LANCETS 28G MISC Use when checking blood sugar fasting, at bedtime and about 2 hours after largest meal of the day 09/17/18   Fulp, Cammie, MD  vitamin B-12 (CYANOCOBALAMIN) 1000 MCG tablet Take 1 tablet (1,000 mcg total) by mouth daily. 02/15/22   Ladell Pier, MD    Physical Exam: Vitals:   07/11/22 2215 07/11/22 2245 07/11/22 2301 07/11/22 2315  BP: 101/67 94/67  100/69  Pulse: 83 78  76  Resp: 13 14  13   Temp:   (!) 97.3 F (36.3 C)   TempSrc:   Oral   SpO2: 98% 97%  99%    Physical Exam Constitutional:      General: He is not in acute distress.    Comments: Thin, chronically ill-appearing male  HENT:     Head: Normocephalic and atraumatic.     Mouth/Throat:     Mouth: Mucous membranes are moist.     Pharynx: Oropharynx is clear.  Eyes:     Extraocular Movements: Extraocular movements intact.     Pupils: Pupils are equal, round, and reactive to light.  Cardiovascular:     Rate and Rhythm: Normal rate and regular rhythm.     Pulses: Normal pulses.     Heart sounds: Normal heart sounds.  Pulmonary:     Effort: Pulmonary effort is normal. No respiratory distress.     Breath sounds: Normal breath sounds.  Abdominal:     General: Bowel sounds are normal. There is no distension.     Palpations: Abdomen is soft.     Tenderness: There is no abdominal tenderness.  Musculoskeletal:        General: No swelling or deformity.  Skin:    General: Skin is warm and dry.  Neurological:     General: No focal deficit present.     Mental  Status: He is oriented to person, place, and time.    Labs on Admission: I have personally reviewed following labs and imaging studies  CBC: Recent Labs  Lab 07/11/22 1615  WBC 3.7*  NEUTROABS 2.6  HGB 12.0*  HCT 38.1*  MCV 84.5  PLT 250    Basic Metabolic Panel: Recent Labs  Lab 07/11/22 1615 07/11/22 1634  NA 138  --   K 4.2  --   CL 95*  --   CO2 29  --   GLUCOSE 96  --   BUN 17  --   CREATININE 0.47*  --   CALCIUM 7.9*  --  MG  --  1.8    GFR: CrCl cannot be calculated (Unknown ideal weight.).  Liver Function Tests: Recent Labs  Lab 07/11/22 1615  AST 60*  ALT 46*  ALKPHOS 120  BILITOT 1.0  PROT 7.1  ALBUMIN 4.0    Urine analysis:    Component Value Date/Time   COLORURINE YELLOW 07/11/2022 2132   APPEARANCEUR CLEAR 07/11/2022 2132   LABSPEC 1.010 07/11/2022 2132   PHURINE 5.0 07/11/2022 2132   GLUCOSEU NEGATIVE 07/11/2022 2132   HGBUR MODERATE (A) 07/11/2022 2132   BILIRUBINUR NEGATIVE 07/11/2022 2132   BILIRUBINUR negative 09/17/2018 1635   KETONESUR 5 (A) 07/11/2022 2132   PROTEINUR NEGATIVE 07/11/2022 2132   UROBILINOGEN 0.2 09/17/2018 1635   UROBILINOGEN 1.0 11/04/2013 1736   NITRITE NEGATIVE 07/11/2022 2132   LEUKOCYTESUR NEGATIVE 07/11/2022 2132    Radiological Exams on Admission: CT Cervical Spine Wo Contrast  Result Date: 07/11/2022 CLINICAL DATA:  Altered mental status. EXAM: CT CERVICAL SPINE WITHOUT CONTRAST TECHNIQUE: Multidetector CT imaging of the cervical spine was performed without intravenous contrast. Multiplanar CT image reconstructions were also generated. RADIATION DOSE REDUCTION: This exam was performed according to the departmental dose-optimization program which includes automated exposure control, adjustment of the mA and/or kV according to patient size and/or use of iterative reconstruction technique. COMPARISON:  None Available. FINDINGS: Alignment: Normal. Skull base and vertebrae: No acute fracture. No primary  bone lesion or focal pathologic process. Soft tissues and spinal canal: No prevertebral fluid or swelling. No visible canal hematoma. Disc levels: There is disc space narrowing and endplate osteophyte formation throughout the cervical spine compatible with degenerative change. Neural foraminal stenosis is seen bilaterally at C3-C4, C5-C6 and C6-C7. There is mild central canal stenosis at these levels secondary to disc osteophyte complexes. Upper chest: Negative. Other: Examination is mildly technically limited secondary to motion artifact. IMPRESSION: 1. No acute fracture or traumatic subluxation of the cervical spine. 2.  Moderate degenerative changes. Electronically Signed   By: Ronney Asters M.D.   On: 07/11/2022 20:01   CT Head Wo Contrast  Result Date: 07/11/2022 CLINICAL DATA:  ams EXAM: CT HEAD WITHOUT CONTRAST TECHNIQUE: Contiguous axial images were obtained from the base of the skull through the vertex without intravenous contrast. RADIATION DOSE REDUCTION: This exam was performed according to the departmental dose-optimization program which includes automated exposure control, adjustment of the mA and/or kV according to patient size and/or use of iterative reconstruction technique. COMPARISON:  None Available. BRAIN: BRAIN Cerebral ventricle sizes are concordant with the degree of cerebral volume loss. Patchy and confluent areas of decreased attenuation are noted throughout the deep and periventricular white matter of the cerebral hemispheres bilaterally, compatible with chronic microvascular ischemic disease. No evidence of large-territorial acute infarction. No parenchymal hemorrhage. No mass lesion. No extra-axial collection. No mass effect or midline shift. No hydrocephalus. Basilar cisterns are patent. Vascular: No hyperdense vessel. Atherosclerotic calcifications are present within the cavernous internal carotid and vertebral arteries. Skull: No acute fracture or focal lesion. Sinuses/Orbits:  Paranasal sinuses and mastoid air cells are clear. The orbits are unremarkable. Other: Tube noted coursing within the right nares and tip overlying the hypopharynx on scalp lateral view. IMPRESSION: No acute intracranial abnormality. Electronically Signed   By: Iven Finn M.D.   On: 07/11/2022 20:01   DG Chest Port 1 View  Result Date: 07/11/2022 CLINICAL DATA:  Possible sepsis. EXAM: PORTABLE CHEST 1 VIEW COMPARISON:  11/04/2013 radiograph FINDINGS: The cardiomediastinal silhouette is unremarkable. There is no evidence of  focal airspace disease, pulmonary edema, suspicious pulmonary nodule/mass, pleural effusion, or pneumothorax. No acute bony abnormalities are identified. IMPRESSION: No active disease. Electronically Signed   By: Margarette Canada M.D.   On: 07/11/2022 16:46    EKG: Independently reviewed.  Sinus rhythm at 80 bpm.  Nonspecific T wave abnormalities.  Borderline QTc at 477.  Some baseline artifact and low voltage in multiple leads.  Assessment/Plan Principal Problem:   Very severe alcohol intoxication with complication (HCC) Active Problems:   COPD (chronic obstructive pulmonary disease) (HCC)   Alcohol abuse   Hypertension   Type 2 diabetes mellitus with hyperglycemia (HCC)   Acute on chronic respiratory failure with hypoxia and hypercapnia (HCC)   Acute encephalopathy   Alcohol abuse Acute encephalopathy Very severe alcohol intoxication Acute on chronic respiratory failure with hypoxia and hypercapnia > Patient presenting with acute encephalopathy when being found down by his mother.  Minimally responsive for EMS and then decreasing responsiveness in the ED. > Found to have severely elevated ethanol level at 524 in the setting of heavy alcohol use history.  No reported history of withdrawal however he has never stopped drinking long enough to see if he would withdrawal it appears. > Noted to be hypoxic requiring 6 to 8 L to maintain saturations with normal chest x-ray.   While in the ED became hypercarbic so was started on BiPAP. > Alertness improved after receiving flumazenil to reverse previously given Ativan for CT scan.  Able to tolerate BiPAP for now. > His symptoms all seem to stem from his severe intoxication.  Leukopenia at 3.7.  Mildly elevated lactic acid at 2.2 and then 2.4 on repeat.  Normal troponin.  Normal TSH.  Normal urinalysis.  Normal chest x-ray as above.  No other evidence of infection. > Case has been discussed with PCCM prior to flumazenil administration and improvement, they could be reconsulted if needed. - Monitor on stepdown unit - Continue BiPAP for now - If worsening mentation consider additional flumazenil dose - CIWA, no Ativan for now as this previously led to near obtundation - Thiamine, folate, multivitamin - IV fluids - Supportive care - Add on phosphorus  COPD - As needed DuoNeb  Hypertension > History of antihypertensives prescribed he has low normal blood pressure in the ED either way. - Continue to monitor  Diabetes - SSI  DVT prophylaxis: Lovenox Code Status:   Full Family Communication:  None on admission, mother was with him on arrival and is aware of his work-up and admission. Disposition Plan:   Patient is from:  Home  Anticipated DC to:  Home  Anticipated DC date:  2 to 7 days  Anticipated DC barriers: Concern for impending alcohol withdrawal  Consults called:  Case discussed with PCCM, ultimately improved enough to for stepdown admission.  They are not following as far as I can tell. Admission status:  Inpatient, stepdown  Severity of Illness: The appropriate patient status for this patient is INPATIENT. Inpatient status is judged to be reasonable and necessary in order to provide the required intensity of service to ensure the patient's safety. The patient's presenting symptoms, physical exam findings, and initial radiographic and laboratory data in the context of their chronic comorbidities is felt to  place them at high risk for further clinical deterioration. Furthermore, it is not anticipated that the patient will be medically stable for discharge from the hospital within 2 midnights of admission.   * I certify that at the point of admission it is my  clinical judgment that the patient will require inpatient hospital care spanning beyond 2 midnights from the point of admission due to high intensity of service, high risk for further deterioration and high frequency of surveillance required.Marcelyn Bruins MD [facility]  How to contact the [facility] Attending or Consulting provider [place] or covering provider during after hours [place], for this patient?   Check the care team in [facility] and look for a) attending/consulting [facility] provider listed and b) the [facility] team listed Log into [email or web address] and use [place]'s universal password to access. If you do not have the password, please contact the hospital operator. Locate the [facility] provider you are looking for under [facility] and page to a number that you can be directly reached. If you still have difficulty reaching the provider, please page the [facility] (Director on Call) for the Hospitalists listed on amion for assistance.  07/11/2022, 11:28 PM

## 2022-07-12 ENCOUNTER — Other Ambulatory Visit: Payer: Self-pay

## 2022-07-12 LAB — GLUCOSE, CAPILLARY
Glucose-Capillary: 105 mg/dL — ABNORMAL HIGH (ref 70–99)
Glucose-Capillary: 112 mg/dL — ABNORMAL HIGH (ref 70–99)
Glucose-Capillary: 159 mg/dL — ABNORMAL HIGH (ref 70–99)
Glucose-Capillary: 244 mg/dL — ABNORMAL HIGH (ref 70–99)
Glucose-Capillary: 293 mg/dL — ABNORMAL HIGH (ref 70–99)
Glucose-Capillary: 314 mg/dL — ABNORMAL HIGH (ref 70–99)
Glucose-Capillary: 94 mg/dL (ref 70–99)

## 2022-07-12 LAB — COMPREHENSIVE METABOLIC PANEL
ALT: 37 U/L (ref 0–44)
AST: 43 U/L — ABNORMAL HIGH (ref 15–41)
Albumin: 3.4 g/dL — ABNORMAL LOW (ref 3.5–5.0)
Alkaline Phosphatase: 97 U/L (ref 38–126)
Anion gap: 13 (ref 5–15)
BUN: 16 mg/dL (ref 8–23)
CO2: 28 mmol/L (ref 22–32)
Calcium: 7.8 mg/dL — ABNORMAL LOW (ref 8.9–10.3)
Chloride: 101 mmol/L (ref 98–111)
Creatinine, Ser: 0.42 mg/dL — ABNORMAL LOW (ref 0.61–1.24)
GFR, Estimated: >60 mL/min (ref >60)
Glucose, Bld: 104 mg/dL — ABNORMAL HIGH (ref 70–99)
Potassium: 3.9 mmol/L (ref 3.5–5.1)
Sodium: 142 mmol/L (ref 135–145)
Total Bilirubin: 0.8 mg/dL (ref 0.3–1.2)
Total Protein: 6.1 g/dL — ABNORMAL LOW (ref 6.5–8.1)

## 2022-07-12 LAB — URINE CULTURE: Culture: NO GROWTH

## 2022-07-12 LAB — PHOSPHORUS: Phosphorus: 3.2 mg/dL (ref 2.5–4.6)

## 2022-07-12 LAB — CBC
HCT: 33.2 % — ABNORMAL LOW (ref 39.0–52.0)
Hemoglobin: 10.4 g/dL — ABNORMAL LOW (ref 13.0–17.0)
MCH: 26.6 pg (ref 26.0–34.0)
MCHC: 31.3 g/dL (ref 30.0–36.0)
MCV: 84.9 fL (ref 80.0–100.0)
Platelets: 223 10*3/uL (ref 150–400)
RBC: 3.91 MIL/uL — ABNORMAL LOW (ref 4.22–5.81)
RDW: 25.3 % — ABNORMAL HIGH (ref 11.5–15.5)
WBC: 5.3 10*3/uL (ref 4.0–10.5)
nRBC: 0 % (ref 0.0–0.2)

## 2022-07-12 LAB — MRSA NEXT GEN BY PCR, NASAL: MRSA by PCR Next Gen: NOT DETECTED

## 2022-07-12 LAB — ETHANOL: Alcohol, Ethyl (B): 199 mg/dL — ABNORMAL HIGH (ref <10)

## 2022-07-12 MED ORDER — LORAZEPAM 1 MG PO TABS
1.0000 mg | ORAL_TABLET | ORAL | Status: AC | PRN
  Administered 2022-07-12 (×2): 2 mg via ORAL
  Administered 2022-07-13: 3 mg via ORAL
  Administered 2022-07-13: 1 mg via ORAL
  Administered 2022-07-13: 2 mg via ORAL
  Administered 2022-07-14 – 2022-07-15 (×3): 1 mg via ORAL
  Filled 2022-07-12: qty 3
  Filled 2022-07-12 (×3): qty 1
  Filled 2022-07-12: qty 2
  Filled 2022-07-12 (×2): qty 1
  Filled 2022-07-12: qty 3
  Filled 2022-07-12: qty 2

## 2022-07-12 MED ORDER — NICOTINE 21 MG/24HR TD PT24
21.0000 mg | MEDICATED_PATCH | Freq: Every day | TRANSDERMAL | Status: DC
  Administered 2022-07-12 – 2022-07-17 (×6): 21 mg via TRANSDERMAL
  Filled 2022-07-12 (×6): qty 1

## 2022-07-12 MED ORDER — ORAL CARE MOUTH RINSE: 15.0000 mL | OROMUCOSAL | Status: DC | PRN

## 2022-07-12 MED ORDER — LORAZEPAM 2 MG/ML IJ SOLN
1.0000 mg | INTRAMUSCULAR | Status: AC | PRN
  Administered 2022-07-12 – 2022-07-14 (×3): 2 mg via INTRAVENOUS
  Administered 2022-07-14: 1 mg via INTRAVENOUS
  Administered 2022-07-14: 2 mg via INTRAVENOUS
  Filled 2022-07-12 (×5): qty 1

## 2022-07-12 MED ORDER — ORAL CARE MOUTH RINSE
15.0000 mL | OROMUCOSAL | Status: DC
  Administered 2022-07-12 – 2022-07-17 (×20): 15 mL via OROMUCOSAL

## 2022-07-12 MED ORDER — CHLORHEXIDINE GLUCONATE CLOTH 2 % EX PADS
6.0000 | MEDICATED_PAD | Freq: Every day | CUTANEOUS | Status: DC
Start: 2022-07-12 — End: 2022-07-16
  Administered 2022-07-12 – 2022-07-14 (×3): 6 via TOPICAL

## 2022-07-12 NOTE — Progress Notes (Signed)
PROGRESS NOTE    [name]  ZOX:[id number] DOB: 1961/01/27 DOA: 07/11/2022 PCP: Marcine Matar, MD    Brief Narrative:   [name] is a 61 y.o. male with past medical history significant for COPD, HTN, type 2 diabetes mellitus, heavy alcohol abuse, history of pancreatitis who presented to [facility] ED on 8/29 via EMS for confusion, altered mental status.  Patient was found by mother who took him to the store yesterday and reported that he was in normal state of health.  Upon EMS arrival patient was responding only to pain with empty beer cans laying around his home.  Mother reports that he is a chronic alcohol user and will drink heavily and whenever he can get his hands on any type of alcohol.  No history of toxic alcohol use.  Mother reports he has had some general decline over the past month with a gradual increasing weakness.  Mother reports does not recall him having alcohol withdrawal symptoms; but does not remember him actually ever stopping drinking for a significant period of time.  In the ED, temperature 97.7 F, HR 84, RR 11, BP 89/60, SPO2 89% on room air.  WBC 3.7, hemoglobin 12.0, platelets 268.  Sodium 138, potassium 4.2, chloride 95, CO2 29, BUN 17, creatinine 0.47.  Glucose 96.  AST 60, ALT 46, total bilirubin 1.0.  Ammonia level 17.  Lactic acid 2.2.  Acetaminophen level less than 10.  Salicylate level less than 7.0.  COVID-19 PCR negative.  Influenza A/B PCR negative.  Urinalysis unrevealing.  EtOH level 524.  TSH 0.608.  CT head without contrast with no acute intracranial abnormality.  CT C-spine without contrast with no acute fracture/traumatic subluxation of the cervical spine, moderate degenerative changes noted.  Chest x-ray with no active cardiopulmonary disease process.  Patient received 2 L IV fluid bolus, folate, thiamine, DuoNeb treatment, cefepime, Ativan.  Then received flumazenil due to worsening mentation after receiving the Ativan for a CT scan with  improvement.  PCCM was consulted and given improvement after flumazenil and believes he does not need to be in the ICU at this time and would be appropriate for the stepdown unit for now.  TRH consulted for further evaluation and management of acute metabolic encephalopathy secondary to EtOH abuse with underlying respiratory depression/hypoxia due to poor mental status/Ativan use.  Assessment & Plan:   Acute respiratory failure with hypoxia/hypercapnia While in the ED, patient was hypoxic requiring 6-8 L nasal cannula maintain saturations.  Chest x-ray with no acute cardiopulmonary disease process.  ABG notable for hypercarbia and patient was started on BiPAP and alertness improved after receiving flumazenil as well to reverse previously given Ativan.  Underlying etiology likely secondary to his severe intoxication.  No infectious etiology found at this time. --Continue supplemental oxygen, maintain SPO2 greater than 88% (Hx COPD) --Incentive spirometry  Acute metabolic encephalopathy Severe EtOH use disorder with intoxication Concern for impending EtOH withdrawal Patient found at home down by mother in which she was minimally responsive on EMS arrival.  EtOH level 524 on admission.  TSH within normal limits, no infectious etiology found. --EtoH 524>199 --CIWA protocol with symptom triggered Ativan --Thiamine, folate, multivitamin --Supportive care, remain in stepdown unit for concern of severe and impending withdrawal symptoms  Hx COPD Not oxygen dependent at baseline --Continue supplemental oxygen as above, maintain SPO2 >88% --DuoNebs as needed  Hx Essential hypertension Currently not on antihypertensives outpatient.  Patient was noted to be slightly hypotensive on admission, likely secondary to  volume depletion and which responded well to IV fluid hydration. --Monitor BP closely  Lactic acidosis Lactic acid 2.2 on admission, likely secondary to volume depletion/dehydration from severe  EtOH abuse/intoxication.  No infectious etiology noted on work-up.  Received IV fluid hydration on admission. --Repeat lactic acid in the a.m.  Type 2 diabetes mellitus Patient reportedly on metformin 1000 g p.o. twice daily at home.  Hemoglobin A1c 12.0 on 12/09/2020. --Hold oral hypoglycemics while inpatient --Repeat hemoglobin A1c --SSI for coverage --CBGs qAC/HS  Nicotine use disorder Patient continues to endorse 1 pack/day.  Counseled on need for complete cessation. --Nicotine patch   DVT prophylaxis: enoxaparin (LOVENOX) injection 40 mg Start: 07/12/22 1000    Code Status: Full Code Family Communication:   Disposition Plan:  Level of care: Stepdown Status is: Observation The patient will require care spanning > 2 midnights and should be moved to inpatient because: Continues to require supplemental oxygen, remains intoxicated and currently undergoing early withdrawal symptoms    Consultants:  Case was discussed with PCCM on admission by admitting physician  Procedures:  None  Antimicrobials:  Cefepime 8/29 x 1 dose   Subjective: Patient seen examined bedside, resting comfortably.  Remains on oxygen with continued desaturation when discontinued.  Slightly confused.  Requesting something to eat.  RN present at bedside.  No other questions or concerns at this time.  Denies headache, no chest pain, no shortness of breath, no abdominal pain, no dizziness, no visual changes, no fever/chills/night sweats, no nausea/vomiting/diarrhea, no focal weakness, no fatigue, no paresthesias.  No acute events overnight per nursing staff.  Objective: Vitals:   07/12/22 0900 07/12/22 1000 07/12/22 1100 07/12/22 1200  BP: 125/79 (!) 140/81 (!) 168/93 (!) 156/74  Pulse: (!) 113 (!) 106 (!) 124 (!) 108  Resp: 16 19 (!) 24 (!) 21  Temp:    98.5 F (36.9 C)  TempSrc:    Axillary  SpO2: (!) 83% 92% 90% 98%  Weight:      Height:        Intake/Output Summary (Last 24 hours) at 07/12/2022  1322 Last data filed at 07/12/2022 1249 Gross per 24 hour  Intake 1512.24 ml  Output 350 ml  Net 1162.24 ml   Filed Weights   07/12/22 0100  Weight: 82.5 kg    Examination:  Physical Exam: GEN: NAD, alert, slightly confused, chronically ill/cachectic in appearance, appears older than stated age HEENT: NCAT, PERRL, EOMI, sclera clear, dry mucous membranes, poor dentition PULM: CTAB w/o wheezes/crackles, normal respiratory effort, on 2.5 L nasal cannula SPO2 99% at rest CV: RRR w/o M/G/R GI: abd soft, NTND, NABS, no R/G/M MSK: no peripheral edema, moves all extremities independently NEURO: CN II-XII intact, no focal deficits, sensation to light touch intact PSYCH: normal mood/affect Integumentary: dry/intact, no rashes or wounds    Data Reviewed: I have personally reviewed following labs and imaging studies  CBC: Recent Labs  Lab 07/11/22 1615 07/12/22 0258  WBC 3.7* 5.3  NEUTROABS 2.6  --   HGB 12.0* 10.4*  HCT 38.1* 33.2*  MCV 84.5 84.9  PLT 268 223   Basic Metabolic Panel: Recent Labs  Lab 07/11/22 1615 07/11/22 1634 07/12/22 0258  NA 138  --  142  K 4.2  --  3.9  CL 95*  --  101  CO2 29  --  28  GLUCOSE 96  --  104*  BUN 17  --  16  CREATININE 0.47*  --  0.42*  CALCIUM 7.9*  --  7.8*  MG  --  1.8  --   PHOS  --   --  3.2   GFR: Estimated Creatinine Clearance: 103.7 mL/min (A) (by C-G formula based on SCr of 0.42 mg/dL (L)). Liver Function Tests: Recent Labs  Lab 07/11/22 1615 07/12/22 0258  AST 60* 43*  ALT 46* 37  ALKPHOS 120 97  BILITOT 1.0 0.8  PROT 7.1 6.1*  ALBUMIN 4.0 3.4*   Recent Labs  Lab 07/11/22 1615  LIPASE 19   Recent Labs  Lab 07/11/22 1617  AMMONIA 17   Coagulation Profile: Recent Labs  Lab 07/11/22 1615  INR 1.0   Cardiac Enzymes: No results for input(s): "CKTOTAL", "CKMB", "CKMBINDEX", "TROPONINI" in the last 168 hours. BNP (last 3 results) No results for input(s): "PROBNP" in the last 8760 hours. HbA1C: No  results for input(s): "HGBA1C" in the last 72 hours. CBG: Recent Labs  Lab 07/11/22 2043 07/12/22 0052 07/12/22 0334 07/12/22 0753 07/12/22 1244  GLUCAP 100* 94 112* 105* 293*   Lipid Profile: No results for input(s): "CHOL", "HDL", "LDLCALC", "TRIG", "CHOLHDL", "LDLDIRECT" in the last 72 hours. Thyroid Function Tests: Recent Labs    07/11/22 1616  TSH 0.608   Anemia Panel: No results for input(s): "VITAMINB12", "FOLATE", "FERRITIN", "TIBC", "IRON", "RETICCTPCT" in the last 72 hours. Sepsis Labs: Recent Labs  Lab 07/11/22 1615 07/11/22 2156  LATICACIDVEN 2.2* 2.4*    Recent Results (from the past 240 hour(s))  Blood Culture (routine x 2)     Status: None (Preliminary result)   Collection Time: 07/11/22  4:15 PM   Specimen: BLOOD  Result Value Ref Range Status   Specimen Description   Final    BLOOD BLOOD RIGHT HAND Performed at [facility], [address] [place], [place] [address]    Special Requests   Final    BOTTLES DRAWN AEROBIC AND ANAEROBIC Blood Culture results may not be optimal due to an inadequate volume of blood received in culture bottles Performed at [facility] [address], [place], [place] [address]    Culture   Final    NO GROWTH < 12 HOURS Performed at [facility], [address], [place], [place] [address]    Report Status PENDING  Incomplete  Resp Panel by RT-PCR (Flu A&B, Covid) Anterior Nasal Swab     Status: None   Collection Time: 07/11/22  4:16 PM   Specimen: Anterior Nasal Swab  Result Value Ref Range Status   SARS Coronavirus 2 by RT PCR NEGATIVE NEGATIVE Final    Comment: (NOTE) SARS-CoV-2 target nucleic acids are NOT DETECTED.  The SARS-CoV-2 RNA is generally detectable in upper respiratory specimens during the acute phase of infection. The lowest concentration of SARS-CoV-2 viral copies this assay can detect is 138 copies/mL. A negative result does not preclude  SARS-Cov-2 infection and should not be used as the sole basis for treatment or other patient management decisions. A negative result may occur with  improper specimen collection/handling, submission of specimen other than nasopharyngeal swab, presence of viral mutation(s) within the areas targeted by this assay, and inadequate number of viral copies(<138 copies/mL). A negative result must be combined with clinical observations, patient history, and epidemiological information. The expected result is Negative.  Fact Sheet for Patients:  BloggerCourse.com  Fact Sheet for Healthcare Providers:  SeriousBroker.it  This test is no t yet approved or cleared by the [place] FDA and  has been authorized for detection and/or diagnosis of  SARS-CoV-2 by FDA under an Emergency Use Authorization (EUA). This EUA will remain  in effect (meaning this test can be used) for the duration of the COVID-19 declaration under Section 564(b)(1) of the Act, 21 U.S.C.section 360bbb-3(b)(1), unless the authorization is terminated  or revoked sooner.       Influenza A by PCR NEGATIVE NEGATIVE Final   Influenza B by PCR NEGATIVE NEGATIVE Final    Comment: (NOTE) The Xpert Xpress SARS-CoV-2/FLU/RSV plus assay is intended as an aid in the diagnosis of influenza from Nasopharyngeal swab specimens and should not be used as a sole basis for treatment. Nasal washings and aspirates are unacceptable for Xpert Xpress SARS-CoV-2/FLU/RSV testing.  Fact Sheet for Patients: BloggerCourse.com  Fact Sheet for Healthcare Providers: SeriousBroker.it  This test is not yet approved or cleared by the [place] FDA and has been authorized for detection and/or diagnosis of SARS-CoV-2 by FDA under an Emergency Use Authorization (EUA). This EUA will remain in effect (meaning this test can be used) for the duration of  the COVID-19 declaration under Section 564(b)(1) of the Act, 21 U.S.C. section 360bbb-3(b)(1), unless the authorization is terminated or revoked.  Performed at [facility], [address], [place], [place] [address]   Blood Culture (routine x 2)     Status: None (Preliminary result)   Collection Time: 07/11/22  4:20 PM   Specimen: BLOOD  Result Value Ref Range Status   Specimen Description   Final    BLOOD LEFT ANTECUBITAL Performed at [facility], [address], [place], [place] [address]    Special Requests   Final    BOTTLES DRAWN AEROBIC AND ANAEROBIC Blood Culture adequate volume Performed at [facility], [address], [place], [place] [address]    Culture   Final    NO GROWTH < 12 HOURS Performed at [facility], [address] [place], [place] [address]    Report Status PENDING  Incomplete  MRSA Next Gen by PCR, Nasal     Status: None   Collection Time: 07/12/22  4:03 AM   Specimen: Nasal Mucosa; Nasal Swab  Result Value Ref Range Status   MRSA by PCR Next Gen NOT DETECTED NOT DETECTED Final    Comment: (NOTE) The GeneXpert MRSA Assay (FDA approved for NASAL specimens only), is one component of a comprehensive MRSA colonization surveillance program. It is not intended to diagnose MRSA infection nor to guide or monitor treatment for MRSA infections. Test performance is not FDA approved in patients less than 41 years old. Performed at [facility], [address], [place], [place] [address]          Radiology Studies: CT Cervical Spine Wo Contrast  Result Date: 07/11/2022 CLINICAL DATA:  Altered mental status. EXAM: CT CERVICAL SPINE WITHOUT CONTRAST TECHNIQUE: Multidetector CT imaging of the cervical spine was performed without intravenous contrast. Multiplanar CT image reconstructions were also generated. RADIATION DOSE REDUCTION: This exam was performed according to the  departmental dose-optimization program which includes automated exposure control, adjustment of the mA and/or kV according to patient size and/or use of iterative reconstruction technique. COMPARISON:  None Available. FINDINGS: Alignment: Normal. Skull base and vertebrae: No acute fracture. No primary bone lesion or focal pathologic process. Soft tissues and spinal canal: No prevertebral fluid or swelling. No visible canal hematoma. Disc levels: There is disc space narrowing and endplate osteophyte formation throughout the cervical spine compatible with degenerative change. Neural foraminal stenosis is  seen bilaterally at C3-C4, C5-C6 and C6-C7. There is mild central canal stenosis at these levels secondary to disc osteophyte complexes. Upper chest: Negative. Other: Examination is mildly technically limited secondary to motion artifact. IMPRESSION: 1. No acute fracture or traumatic subluxation of the cervical spine. 2.  Moderate degenerative changes. Electronically Signed   By: Darliss Cheney M.D.   On: 07/11/2022 20:01   CT Head Wo Contrast  Result Date: 07/11/2022 CLINICAL DATA:  ams EXAM: CT HEAD WITHOUT CONTRAST TECHNIQUE: Contiguous axial images were obtained from the base of the skull through the vertex without intravenous contrast. RADIATION DOSE REDUCTION: This exam was performed according to the departmental dose-optimization program which includes automated exposure control, adjustment of the mA and/or kV according to patient size and/or use of iterative reconstruction technique. COMPARISON:  None Available. BRAIN: BRAIN Cerebral ventricle sizes are concordant with the degree of cerebral volume loss. Patchy and confluent areas of decreased attenuation are noted throughout the deep and periventricular white matter of the cerebral hemispheres bilaterally, compatible with chronic microvascular ischemic disease. No evidence of large-territorial acute infarction. No parenchymal hemorrhage. No mass lesion. No  extra-axial collection. No mass effect or midline shift. No hydrocephalus. Basilar cisterns are patent. Vascular: No hyperdense vessel. Atherosclerotic calcifications are present within the cavernous internal carotid and vertebral arteries. Skull: No acute fracture or focal lesion. Sinuses/Orbits: Paranasal sinuses and mastoid air cells are clear. The orbits are unremarkable. Other: Tube noted coursing within the right nares and tip overlying the hypopharynx on scalp lateral view. IMPRESSION: No acute intracranial abnormality. Electronically Signed   By: Tish Frederickson M.D.   On: 07/11/2022 20:01   DG Chest Port 1 View  Result Date: 07/11/2022 CLINICAL DATA:  Possible sepsis. EXAM: PORTABLE CHEST 1 VIEW COMPARISON:  11/04/2013 radiograph FINDINGS: The cardiomediastinal silhouette is unremarkable. There is no evidence of focal airspace disease, pulmonary edema, suspicious pulmonary nodule/mass, pleural effusion, or pneumothorax. No acute bony abnormalities are identified. IMPRESSION: No active disease. Electronically Signed   By: Harmon Pier M.D.   On: 07/11/2022 16:46        Scheduled Meds:  Chlorhexidine Gluconate Cloth  6 each Topical Q0600   cyanocobalamin  1,000 mcg Oral Daily   enoxaparin (LOVENOX) injection  40 mg Subcutaneous Q24H   ferrous sulfate  325 mg Oral Q breakfast   folic acid  1 mg Oral Once   folic acid  1 mg Oral Daily   insulin aspart  0-9 Units Subcutaneous Q4H   multivitamin with minerals  1 tablet Oral Daily   mouth rinse  15 mL Mouth Rinse 4 times per day   sodium chloride flush  3 mL Intravenous Q12H   thiamine  100 mg Oral Daily   Or   thiamine  100 mg Intravenous Daily   Continuous Infusions:  lactated ringers 125 mL/hr at 07/12/22 0302     LOS: 0 days    Time spent: 51 minutes spent on chart review, discussion with nursing staff, consultants, updating family and interview/physical exam; more than 50% of that time was spent in counseling and/or coordination  of care.    Alvira Philips [place], DO [facility] Available via Epic secure chat 7am-7pm After these hours, please refer to coverage provider listed on [email or web address] 07/12/2022, 1:22 PM

## 2022-07-12 NOTE — Progress Notes (Signed)
Pt refused BiPAP. Without O2 pt SAT's dropped to 82%. Connected Pt to HFNC on 7L O2. Sat's at  95%

## 2022-07-12 NOTE — Evaluation (Signed)
Physical Therapy Evaluation Patient Details Name: [name] MRN: [id number] DOB: Jul 17, 1961 Today's Date: 07/12/2022  History of Present Illness  61 yo male admitted with severe ETOH intoxication, acute respiratory failure. Hx of ETOH abuse, COPD, pancreatitis, DM  Clinical Impression  On eval, pt was Supv level for bed mobility and lateral scoot towards HOB. With minimal activity, O2 decreased to 86% on RA and HR increased to 148 bpm. Deferred any further activity. Assisted pt back to bed. Made RN aware. Will continue to follow and progress activity as tolerated.      Recommendations for follow up therapy are one component of a multi-disciplinary discharge planning process, led by the attending physician.  Recommendations may be updated based on patient status, additional functional criteria and insurance authorization.  Follow Up Recommendations No PT follow up      Assistance Recommended at Discharge PRN  Patient can return home with the following  A little help with walking and/or transfers;A little help with bathing/dressing/bathroom    Equipment Recommendations  (continuing to assess)  Recommendations for Other Services       Functional Status Assessment Patient has had a recent decline in their functional status and demonstrates the ability to make significant improvements in function in a reasonable and predictable amount of time.     Precautions / Restrictions Precautions Precautions: Fall Precaution Comments: monitor HR and O2 Restrictions Weight Bearing Restrictions: No      Mobility  Bed Mobility Overal bed mobility: Needs Assistance Bed Mobility: Supine to Sit, Sit to Supine     Supine to sit: Supervision, HOB elevated Sit to supine: Supervision, HOB elevated   General bed mobility comments: Supv for safety, lines. Once EOB, O2 dropped to 86% on RA and HR increased to 148 bpm. Returned to supine and put [place] O2 back on pt.    Transfers                    General transfer comment: Lateral scoot up towards HOB before returning to supine. Deferred standing 2* HR, O2.    Ambulation/Gait                  Stairs            Wheelchair Mobility    Modified Rankin (Stroke Patients Only)       Balance Overall balance assessment: Needs assistance Sitting-balance support: Feet supported Sitting balance-Leahy Scale: Normal                                       Pertinent Vitals/Pain Pain Assessment Pain Assessment: No/denies pain    Home Living Family/patient expects to be discharged to:: Private residence Living Arrangements: Alone   Type of Home: House Home Access: Stairs to enter       Home Layout: Two level;Bed/bath upstairs Home Equipment: None      Prior Function Prior Level of Function : Independent/Modified Independent                     Hand Dominance        Extremity/Trunk Assessment   Upper Extremity Assessment Upper Extremity Assessment: Overall WFL for tasks assessed    Lower Extremity Assessment Lower Extremity Assessment: Generalized weakness    Cervical / Trunk Assessment Cervical / Trunk Assessment: Normal  Communication   Communication: No difficulties  Cognition Arousal/Alertness: Awake/alert Behavior During Therapy:  WFL for tasks assessed/performed Overall Cognitive Status: Within Functional Limits for tasks assessed                                          General Comments      Exercises     Assessment/Plan    PT Assessment Patient needs continued PT services  PT Problem List Decreased strength;Decreased mobility;Decreased activity tolerance;Decreased balance;Decreased knowledge of use of DME       PT Treatment Interventions DME instruction;Gait training;Therapeutic activities;Therapeutic exercise;Patient/family education;Balance training;Functional mobility training    PT Goals (Current goals can be found in the  Care Plan section)  Acute Rehab PT Goals Patient Stated Goal: none stated PT Goal Formulation: With patient Time For Goal Achievement: 07/26/22 Potential to Achieve Goals: Good    Frequency Min 3X/week     Co-evaluation               AM-PAC PT "6 Clicks" Mobility  Outcome Measure Help needed turning from your back to your side while in a flat bed without using bedrails?: A Little Help needed moving from lying on your back to sitting on the side of a flat bed without using bedrails?: A Little Help needed moving to and from a bed to a chair (including a wheelchair)?: A Little Help needed standing up from a chair using your arms (e.g., wheelchair or bedside chair)?: A Little Help needed to walk in hospital room?: A Little Help needed climbing 3-5 steps with a railing? : A Lot 6 Click Score: 17    End of Session Equipment Utilized During Treatment: Oxygen Activity Tolerance:  (limited by HR and O2) Patient left: in bed;with call bell/phone within reach;with bed alarm set   PT Visit Diagnosis: Difficulty in walking, not elsewhere classified (R26.2)    Time: 1040-1055 PT Time Calculation (min) (ACUTE ONLY): 15 min   Charges:   PT Evaluation $PT Eval Low Complexity: 1 Low             Faye Ramsay, PT Acute Rehabilitation  Office: [phone] Pager: [phone]

## 2022-07-12 NOTE — TOC Progression Note (Signed)
Transition of Care [facility]) - Progression Note    Patient Details  Name: [name] MRN: [id number] Date of Birth: [birth year]  [facility] CM/SW Contact  Geni Bers, RN Phone Number: 07/12/2022, 1:19 PM  Clinical Narrative:    Substance Abuse resources placed on AVS. TOC will continue to follow pt.    Expected Discharge Plan: Home/Self Care Barriers to Discharge: No Barriers Identified  Expected Discharge Plan and Services Expected Discharge Plan: Home/Self Care     Post Acute Care Choice: NA Living arrangements for the past 2 months: Single Family Home                                       Social Determinants of Health (SDOH) Interventions    Readmission Risk Interventions     No data to display

## 2022-07-13 LAB — BASIC METABOLIC PANEL
Anion gap: 7 (ref 5–15)
BUN: 12 mg/dL (ref 8–23)
CO2: 32 mmol/L (ref 22–32)
Calcium: 8.2 mg/dL — ABNORMAL LOW (ref 8.9–10.3)
Chloride: 94 mmol/L — ABNORMAL LOW (ref 98–111)
Creatinine, Ser: 0.42 mg/dL — ABNORMAL LOW (ref 0.61–1.24)
GFR, Estimated: >60 mL/min (ref >60)
Glucose, Bld: 180 mg/dL — ABNORMAL HIGH (ref 70–99)
Potassium: 3.8 mmol/L (ref 3.5–5.1)
Sodium: 133 mmol/L — ABNORMAL LOW (ref 135–145)

## 2022-07-13 LAB — HEMOGLOBIN A1C
Hgb A1c MFr Bld: 8.2 % — ABNORMAL HIGH (ref 4.8–5.6)
Mean Plasma Glucose: 188.64 mg/dL

## 2022-07-13 LAB — GLUCOSE, CAPILLARY
Glucose-Capillary: 202 mg/dL — ABNORMAL HIGH (ref 70–99)
Glucose-Capillary: 140 mg/dL — ABNORMAL HIGH (ref 70–99)
Glucose-Capillary: 231 mg/dL — ABNORMAL HIGH (ref 70–99)
Glucose-Capillary: 186 mg/dL — ABNORMAL HIGH (ref 70–99)
Glucose-Capillary: 209 mg/dL — ABNORMAL HIGH (ref 70–99)

## 2022-07-13 LAB — ETHANOL: Alcohol, Ethyl (B): <10 mg/dL (ref <10)

## 2022-07-13 LAB — CBC
HCT: 29.5 % — ABNORMAL LOW (ref 39.0–52.0)
Hemoglobin: 9.2 g/dL — ABNORMAL LOW (ref 13.0–17.0)
MCH: 26.4 pg (ref 26.0–34.0)
MCHC: 31.2 g/dL (ref 30.0–36.0)
MCV: 84.8 fL (ref 80.0–100.0)
Platelets: 179 10*3/uL (ref 150–400)
RBC: 3.48 MIL/uL — ABNORMAL LOW (ref 4.22–5.81)
RDW: 24.9 % — ABNORMAL HIGH (ref 11.5–15.5)
WBC: 6 10*3/uL (ref 4.0–10.5)
nRBC: 0 % (ref 0.0–0.2)

## 2022-07-13 LAB — PHOSPHORUS: Phosphorus: 2.1 mg/dL — ABNORMAL LOW (ref 2.5–4.6)

## 2022-07-13 LAB — MAGNESIUM: Magnesium: 1.3 mg/dL — ABNORMAL LOW (ref 1.7–2.4)

## 2022-07-13 LAB — LACTIC ACID, PLASMA: Lactic Acid, Venous: 0.9 mmol/L (ref 0.5–1.9)

## 2022-07-13 MED ORDER — MAGNESIUM SULFATE 4 GM/100ML IV SOLN
4.0000 g | Freq: Once | INTRAVENOUS | Status: AC
  Administered 2022-07-13: 4 g via INTRAVENOUS
  Filled 2022-07-13: qty 100

## 2022-07-13 MED ORDER — SODIUM PHOSPHATES 45 MMOLE/15ML IV SOLN
15.0000 mmol | Freq: Once | INTRAVENOUS | Status: AC
  Administered 2022-07-13: 15 mmol via INTRAVENOUS
  Filled 2022-07-13: qty 5

## 2022-07-13 NOTE — Progress Notes (Signed)
Per patient to add his sister, [name], and uncle, Annette Stable, to his POC list and to make his mother his primary POC.

## 2022-07-13 NOTE — Progress Notes (Addendum)
PROGRESS NOTE    [name]  ZOX:[id number] DOB: 01-Mar-1961 DOA: 07/11/2022 PCP: Marcine Matar, MD    Brief Narrative:   [name] is a 61 y.o. male with past medical history significant for COPD, HTN, type 2 diabetes mellitus, heavy alcohol abuse, history of pancreatitis who presented to [facility] ED on 8/29 via EMS for confusion, altered mental status.  Patient was found by mother who took him to the store yesterday and reported that he was in normal state of health.  Upon EMS arrival patient was responding only to pain with empty beer cans laying around his home.  Mother reports that he is a chronic alcohol user and will drink heavily and whenever he can get his hands on any type of alcohol.  No history of toxic alcohol use.  Mother reports he has had some general decline over the past month with a gradual increasing weakness.  Mother reports does not recall him having alcohol withdrawal symptoms; but does not remember him actually ever stopping drinking for a significant period of time.  In the ED, temperature 97.7 F, HR 84, RR 11, BP 89/60, SPO2 89% on room air.  WBC 3.7, hemoglobin 12.0, platelets 268.  Sodium 138, potassium 4.2, chloride 95, CO2 29, BUN 17, creatinine 0.47.  Glucose 96.  AST 60, ALT 46, total bilirubin 1.0.  Ammonia level 17.  Lactic acid 2.2.  Acetaminophen level less than 10.  Salicylate level less than 7.0.  COVID-19 PCR negative.  Influenza A/B PCR negative.  Urinalysis unrevealing.  EtOH level 524.  TSH 0.608.  CT head without contrast with no acute intracranial abnormality.  CT C-spine without contrast with no acute fracture/traumatic subluxation of the cervical spine, moderate degenerative changes noted.  Chest x-ray with no active cardiopulmonary disease process.  Patient received 2 L IV fluid bolus, folate, thiamine, DuoNeb treatment, cefepime, Ativan.  Then received flumazenil due to worsening mentation after receiving the Ativan for a CT scan with  improvement.  PCCM was consulted and given improvement after flumazenil and believes he does not need to be in the ICU at this time and would be appropriate for the stepdown unit for now.  TRH consulted for further evaluation and management of acute metabolic encephalopathy secondary to EtOH abuse with underlying respiratory depression/hypoxia due to poor mental status/Ativan use.  Assessment & Plan:   Acute respiratory failure with hypoxia/hypercapnia While in the ED, patient was hypoxic requiring 6-8 L nasal cannula maintain saturations.  Chest x-ray with no acute cardiopulmonary disease process.  ABG notable for hypercarbia and patient was started on BiPAP and alertness improved after receiving flumazenil as well to reverse previously given Ativan.  Underlying etiology likely secondary to his severe intoxication.  No infectious etiology found at this time. --Continue supplemental oxygen, maintain SPO2 greater than 88% (Hx COPD) --Incentive spirometry -- Attempt to wean off oxygen today  Acute metabolic encephalopathy Severe EtOH use disorder with intoxication Concern for impending EtOH withdrawal Patient found at home down by mother in which she was minimally responsive on EMS arrival.  EtOH level 524 on admission.  TSH within normal limits, no infectious etiology found. --EtoH 524>199, now <10 --CIWA protocol with symptom triggered Ativan --Thiamine, folate, multivitamin --Supportive care, remain in stepdown unit for concern of severe and impending withdrawal symptoms  Hypomagnesemia Hypophosphatemia Phosphorus 2.1, magnesium 1.3 this morning, will replete. -- Repeat electrolytes in a.m.  Hx COPD Not oxygen dependent at baseline --Continue supplemental oxygen as above, maintain SPO2 >88% --  DuoNebs as needed  Hx Essential hypertension Currently not on antihypertensives outpatient.  Patient was noted to be slightly hypotensive on admission, likely secondary to volume depletion and  which responded well to IV fluid hydration. --Monitor BP closely  Lactic acidosis Lactic acid 2.2 on admission, likely secondary to volume depletion/dehydration from severe EtOH abuse/intoxication.  No infectious etiology noted on work-up.  Received IV fluid hydration on admission. --Repeat lactic acid in the a.m.  Type 2 diabetes mellitus Patient reportedly on metformin 1000 g p.o. twice daily at home.  Hemoglobin A1c 12.0 on 12/09/2020.  Repeat hemoglobin A1c 8.2. --Hold oral hypoglycemics while inpatient --Repeat hemoglobin A1c --SSI for coverage --CBGs qAC/HS  Nicotine use disorder Patient continues to endorse 1 pack/day.  Counseled on need for complete cessation. --Nicotine patch   DVT prophylaxis: enoxaparin (LOVENOX) injection 40 mg Start: 07/12/22 1000    Code Status: Full Code Family Communication: No family present at bedside this morning  Disposition Plan:  Level of care: Stepdown Status is: Inpatient Remains inpatient appropriate because: He is with alcohol withdrawals, CIWA scores elevated to 11, will likely need an additional 1-2 days of hospitalization before safe discharge home      Consultants:  Case was discussed with PCCM on admission by admitting physician  Procedures:  None  Antimicrobials:  Cefepime 8/29 x 1 dose   Subjective: Patient seen examined bedside, resting comfortably.  Remains tremulous and anxious.  RN present at bedside.  CIWA score currently 11.  Has received 7 mg of Ativan over the past 24 hours.  No other questions or concerns at this time.  Denies headache, no chest pain, no shortness of breath, no abdominal pain, no dizziness, no visual changes, no fever/chills/night sweats, no nausea/vomiting/diarrhea, no focal weakness, no fatigue, no paresthesias.  No acute events overnight per nursing staff.  Objective: Vitals:   07/13/22 0500 07/13/22 0600 07/13/22 0800 07/13/22 0830  BP:   (!) 156/88   Pulse: 84 82 89   Resp: (!) 24 20 (!)  32 (!) 24  Temp:   98.2 F (36.8 C)   TempSrc:   Oral   SpO2: 96% 95% 95% 92%  Weight: 83 kg     Height:        Intake/Output Summary (Last 24 hours) at 07/13/2022 1041 Last data filed at 07/13/2022 0839 Gross per 24 hour  Intake 3036.64 ml  Output 1650 ml  Net 1386.64 ml   Filed Weights   07/12/22 0100 07/13/22 0500  Weight: 82.5 kg 83 kg    Examination:  Physical Exam: GEN: NAD, alert, slightly confused, chronically ill/cachectic in appearance, appears older than stated age HEENT: NCAT, PERRL, EOMI, sclera clear, dry mucous membranes, poor dentition PULM: CTAB w/o wheezes/crackles, normal respiratory effort, on 3 L nasal cannula SPO2 99% at rest CV: RRR w/o M/G/R GI: abd soft, NTND, NABS, no R/G/M MSK: no peripheral edema, moves all extremities independently NEURO: CN II-XII intact, no focal deficits, sensation to light touch intact PSYCH: normal mood/affect Integumentary: dry/intact, no rashes or wounds    Data Reviewed: I have personally reviewed following labs and imaging studies  CBC: Recent Labs  Lab 07/11/22 1615 07/12/22 0258 07/13/22 0306  WBC 3.7* 5.3 6.0  NEUTROABS 2.6  --   --   HGB 12.0* 10.4* 9.2*  HCT 38.1* 33.2* 29.5*  MCV 84.5 84.9 84.8  PLT 268 223 179   Basic Metabolic Panel: Recent Labs  Lab 07/11/22 1615 07/11/22 1634 07/12/22 0258 07/13/22 0306  NA 138  --  142 133*  K 4.2  --  3.9 3.8  CL 95*  --  101 94*  CO2 29  --  28 32  GLUCOSE 96  --  104* 180*  BUN 17  --  16 12  CREATININE 0.47*  --  0.42* 0.42*  CALCIUM 7.9*  --  7.8* 8.2*  MG  --  1.8  --  1.3*  PHOS  --   --  3.2 2.1*   GFR: Estimated Creatinine Clearance: 103.7 mL/min (A) (by C-G formula based on SCr of 0.42 mg/dL (L)). Liver Function Tests: Recent Labs  Lab 07/11/22 1615 07/12/22 0258  AST 60* 43*  ALT 46* 37  ALKPHOS 120 97  BILITOT 1.0 0.8  PROT 7.1 6.1*  ALBUMIN 4.0 3.4*   Recent Labs  Lab 07/11/22 1615  LIPASE 19   Recent Labs  Lab  07/11/22 1617  AMMONIA 17   Coagulation Profile: Recent Labs  Lab 07/11/22 1615  INR 1.0   Cardiac Enzymes: No results for input(s): "CKTOTAL", "CKMB", "CKMBINDEX", "TROPONINI" in the last 168 hours. BNP (last 3 results) No results for input(s): "PROBNP" in the last 8760 hours. HbA1C: Recent Labs    07/13/22 0306  HGBA1C 8.2*   CBG: Recent Labs  Lab 07/12/22 1709 07/12/22 1910 07/12/22 2316 07/13/22 0314 07/13/22 0743  GLUCAP 314* 244* 159* 202* 140*   Lipid Profile: No results for input(s): "CHOL", "HDL", "LDLCALC", "TRIG", "CHOLHDL", "LDLDIRECT" in the last 72 hours. Thyroid Function Tests: Recent Labs    07/11/22 1616  TSH 0.608   Anemia Panel: No results for input(s): "VITAMINB12", "FOLATE", "FERRITIN", "TIBC", "IRON", "RETICCTPCT" in the last 72 hours. Sepsis Labs: Recent Labs  Lab 07/11/22 1615 07/11/22 2156 07/13/22 0306  LATICACIDVEN 2.2* 2.4* 0.9    Recent Results (from the past 240 hour(s))  Blood Culture (routine x 2)     Status: None (Preliminary result)   Collection Time: 07/11/22  4:15 PM   Specimen: BLOOD  Result Value Ref Range Status   Specimen Description   Final    BLOOD BLOOD RIGHT HAND Performed at [facility], [address] [place], [place] [address]    Special Requests   Final    BOTTLES DRAWN AEROBIC AND ANAEROBIC Blood Culture results may not be optimal due to an inadequate volume of blood received in culture bottles Performed at [facility], [address] [place], [place] [address]    Culture   Final    NO GROWTH 2 DAYS Performed at [facility], [address], [place], [place] [address]    Report Status PENDING  Incomplete  Resp Panel by RT-PCR (Flu A&B, Covid) Anterior Nasal Swab     Status: None   Collection Time: 07/11/22  4:16 PM   Specimen: Anterior Nasal Swab  Result Value Ref Range Status   SARS Coronavirus 2 by RT PCR NEGATIVE NEGATIVE Final    Comment:  (NOTE) SARS-CoV-2 target nucleic acids are NOT DETECTED.  The SARS-CoV-2 RNA is generally detectable in upper respiratory specimens during the acute phase of infection. The lowest concentration of SARS-CoV-2 viral copies this assay can detect is 138 copies/mL. A negative result does not preclude SARS-Cov-2 infection and should not be used as the sole basis for treatment or other patient management decisions. A negative result may occur with  improper specimen collection/handling, submission of specimen other than nasopharyngeal swab, presence of viral mutation(s) within the areas targeted by this assay, and inadequate number  of viral copies(<138 copies/mL). A negative result must be combined with clinical observations, patient history, and epidemiological information. The expected result is Negative.  Fact Sheet for Patients:  BloggerCourse.com  Fact Sheet for Healthcare Providers:  SeriousBroker.it  This test is no t yet approved or cleared by the [place] FDA and  has been authorized for detection and/or diagnosis of SARS-CoV-2 by FDA under an Emergency Use Authorization (EUA). This EUA will remain  in effect (meaning this test can be used) for the duration of the COVID-19 declaration under Section 564(b)(1) of the Act, 21 U.S.C.section 360bbb-3(b)(1), unless the authorization is terminated  or revoked sooner.       Influenza A by PCR NEGATIVE NEGATIVE Final   Influenza B by PCR NEGATIVE NEGATIVE Final    Comment: (NOTE) The Xpert Xpress SARS-CoV-2/FLU/RSV plus assay is intended as an aid in the diagnosis of influenza from Nasopharyngeal swab specimens and should not be used as a sole basis for treatment. Nasal washings and aspirates are unacceptable for Xpert Xpress SARS-CoV-2/FLU/RSV testing.  Fact Sheet for Patients: BloggerCourse.com  Fact Sheet for Healthcare  Providers: SeriousBroker.it  This test is not yet approved or cleared by the [place] FDA and has been authorized for detection and/or diagnosis of SARS-CoV-2 by FDA under an Emergency Use Authorization (EUA). This EUA will remain in effect (meaning this test can be used) for the duration of the COVID-19 declaration under Section 564(b)(1) of the Act, 21 U.S.C. section 360bbb-3(b)(1), unless the authorization is terminated or revoked.  Performed at [facility], [address], [place], [place] [address]   Blood Culture (routine x 2)     Status: None (Preliminary result)   Collection Time: 07/11/22  4:20 PM   Specimen: BLOOD  Result Value Ref Range Status   Specimen Description   Final    BLOOD LEFT ANTECUBITAL Performed at [facility], [address], [place], [place] [address]    Special Requests   Final    BOTTLES DRAWN AEROBIC AND ANAEROBIC Blood Culture adequate volume Performed at [facility], [address], [place], [place] [address]    Culture   Final    NO GROWTH 2 DAYS Performed at [facility], [address], [place], [place] [address]    Report Status PENDING  Incomplete  Urine Culture     Status: None   Collection Time: 07/11/22  9:41 PM   Specimen: In/Out Cath Urine  Result Value Ref Range Status   Specimen Description   Final    IN/OUT CATH URINE Performed at [facility], [address], [place], [place] [address]    Special Requests   Final    NONE Performed at [facility], [address], [place], [place] [address]    Culture   Final    NO GROWTH Performed at [facility], [address] [place], [place] [address]    Report Status 07/12/2022 FINAL  Final  MRSA Next Gen by PCR, Nasal     Status: None   Collection Time: 07/12/22  4:03 AM   Specimen: Nasal Mucosa; Nasal Swab  Result Value Ref Range Status    MRSA by PCR Next Gen NOT DETECTED NOT DETECTED Final    Comment: (NOTE) The GeneXpert MRSA Assay (FDA approved for NASAL specimens only), is one component of a comprehensive MRSA colonization surveillance program. It is not intended to diagnose MRSA infection nor to guide or  monitor treatment for MRSA infections. Test performance is not FDA approved in patients less than [address] years old. Performed at [facility], [address] [place], [place] [id number]          Radiology Studies: CT Cervical Spine Wo Contrast  Result Date: 07/11/2022 CLINICAL DATA:  Altered mental status. EXAM: CT CERVICAL SPINE WITHOUT CONTRAST TECHNIQUE: Multidetector CT imaging of the cervical spine was performed without intravenous contrast. Multiplanar CT image reconstructions were also generated. RADIATION DOSE REDUCTION: This exam was performed according to the departmental dose-optimization program which includes automated exposure control, adjustment of the mA and/or kV according to patient size and/or use of iterative reconstruction technique. COMPARISON:  None Available. FINDINGS: Alignment: Normal. Skull base and vertebrae: No acute fracture. No primary bone lesion or focal pathologic process. Soft tissues and spinal canal: No prevertebral fluid or swelling. No visible canal hematoma. Disc levels: There is disc space narrowing and endplate osteophyte formation throughout the cervical spine compatible with degenerative change. Neural foraminal stenosis is seen bilaterally at C3-C4, C5-C6 and C6-C7. There is mild central canal stenosis at these levels secondary to disc osteophyte complexes. Upper chest: Negative. Other: Examination is mildly technically limited secondary to motion artifact. IMPRESSION: 1. No acute fracture or traumatic subluxation of the cervical spine. 2.  Moderate degenerative changes. Electronically Signed   By: Darliss CheneyAmy  Guttmann M.D.   On: 07/11/2022 20:01   CT Head Wo  Contrast  Result Date: 07/11/2022 CLINICAL DATA:  ams EXAM: CT HEAD WITHOUT CONTRAST TECHNIQUE: Contiguous axial images were obtained from the base of the skull through the vertex without intravenous contrast. RADIATION DOSE REDUCTION: This exam was performed according to the departmental dose-optimization program which includes automated exposure control, adjustment of the mA and/or kV according to patient size and/or use of iterative reconstruction technique. COMPARISON:  None Available. BRAIN: BRAIN Cerebral ventricle sizes are concordant with the degree of cerebral volume loss. Patchy and confluent areas of decreased attenuation are noted throughout the deep and periventricular white matter of the cerebral hemispheres bilaterally, compatible with chronic microvascular ischemic disease. No evidence of large-territorial acute infarction. No parenchymal hemorrhage. No mass lesion. No extra-axial collection. No mass effect or midline shift. No hydrocephalus. Basilar cisterns are patent. Vascular: No hyperdense vessel. Atherosclerotic calcifications are present within the cavernous internal carotid and vertebral arteries. Skull: No acute fracture or focal lesion. Sinuses/Orbits: Paranasal sinuses and mastoid air cells are clear. The orbits are unremarkable. Other: Tube noted coursing within the right nares and tip overlying the hypopharynx on scalp lateral view. IMPRESSION: No acute intracranial abnormality. Electronically Signed   By: Tish FredericksonMorgane  Naveau M.D.   On: 07/11/2022 20:01   DG Chest Port 1 View  Result Date: 07/11/2022 CLINICAL DATA:  Possible sepsis. EXAM: PORTABLE CHEST 1 VIEW COMPARISON:  11/04/2013 radiograph FINDINGS: The cardiomediastinal silhouette is unremarkable. There is no evidence of focal airspace disease, pulmonary edema, suspicious pulmonary nodule/mass, pleural effusion, or pneumothorax. No acute bony abnormalities are identified. IMPRESSION: No active disease. Electronically Signed   By:  Harmon PierJeffrey  Hu M.D.   On: 07/11/2022 16:46        Scheduled Meds:  Chlorhexidine Gluconate Cloth  6 each Topical Q0600   cyanocobalamin  1,000 mcg Oral Daily   enoxaparin (LOVENOX) injection  40 mg Subcutaneous Q24H   ferrous sulfate  325 mg Oral Q breakfast   folic acid  1 mg Oral Once   folic acid  1 mg Oral Daily   insulin aspart  0-9  Units Subcutaneous Q4H   multivitamin with minerals  1 tablet Oral Daily   nicotine  21 mg Transdermal Daily   mouth rinse  15 mL Mouth Rinse 4 times per day   sodium chloride flush  3 mL Intravenous Q12H   thiamine  100 mg Oral Daily   Or   thiamine  100 mg Intravenous Daily   Continuous Infusions:  sodium phosphate 15 mmol in dextrose 5 % 250 mL infusion 15 mmol (07/13/22 0839)     LOS: 1 day    Time spent: 51 minutes spent on chart review, discussion with nursing staff, consultants, updating family and interview/physical exam; more than 50% of that time was spent in counseling and/or coordination of care.    Alvira Philips [place], DO [facility] Available via Epic secure chat 7am-7pm After these hours, please refer to coverage provider listed on [email or web address] 07/13/2022, 10:41 AM

## 2022-07-14 LAB — BASIC METABOLIC PANEL
Anion gap: 10 (ref 5–15)
BUN: 6 mg/dL — ABNORMAL LOW (ref 8–23)
CO2: 34 mmol/L — ABNORMAL HIGH (ref 22–32)
Calcium: 8.4 mg/dL — ABNORMAL LOW (ref 8.9–10.3)
Chloride: 88 mmol/L — ABNORMAL LOW (ref 98–111)
Creatinine, Ser: 0.33 mg/dL — ABNORMAL LOW (ref 0.61–1.24)
GFR, Estimated: >60 mL/min (ref >60)
Glucose, Bld: 142 mg/dL — ABNORMAL HIGH (ref 70–99)
Potassium: 3.3 mmol/L — ABNORMAL LOW (ref 3.5–5.1)
Sodium: 132 mmol/L — ABNORMAL LOW (ref 135–145)

## 2022-07-14 LAB — PHOSPHORUS: Phosphorus: 3.6 mg/dL (ref 2.5–4.6)

## 2022-07-14 LAB — GLUCOSE, CAPILLARY
Glucose-Capillary: 170 mg/dL — ABNORMAL HIGH (ref 70–99)
Glucose-Capillary: 147 mg/dL — ABNORMAL HIGH (ref 70–99)
Glucose-Capillary: 155 mg/dL — ABNORMAL HIGH (ref 70–99)
Glucose-Capillary: 217 mg/dL — ABNORMAL HIGH (ref 70–99)
Glucose-Capillary: 216 mg/dL — ABNORMAL HIGH (ref 70–99)
Glucose-Capillary: 206 mg/dL — ABNORMAL HIGH (ref 70–99)

## 2022-07-14 LAB — MAGNESIUM: Magnesium: 1.3 mg/dL — ABNORMAL LOW (ref 1.7–2.4)

## 2022-07-14 MED ORDER — POTASSIUM CHLORIDE CRYS ER 20 MEQ PO TBCR
40.0000 meq | EXTENDED_RELEASE_TABLET | ORAL | Status: AC
Start: 2022-07-14 — End: 2022-07-14
  Administered 2022-07-14 (×2): 40 meq via ORAL
  Filled 2022-07-14 (×2): qty 2

## 2022-07-14 MED ORDER — MAGNESIUM SULFATE 4 GM/100ML IV SOLN
4.0000 g | Freq: Once | INTRAVENOUS | Status: AC
Start: 2022-07-14 — End: 2022-07-14
  Administered 2022-07-14: 4 g via INTRAVENOUS
  Filled 2022-07-14: qty 100

## 2022-07-14 NOTE — Discharge Summary (Signed)
Physician Discharge Summary  [name] BLT:[id number] DOB: 23-Sep-1961 DOA: 07/11/2022  PCP: Ladell Pier, MD  Admit date: 07/11/2022 Discharge date: 07/17/2022  Admitted From: Home Disposition: Home  Recommendations for Outpatient Follow-up:  Follow up with PCP in 1-2 weeks Continue encourage alcohol cessation as this is the leading etiology to his hospitalization as he was severely intoxicated underwent mild withdrawals.  Home Health: No needs identified by physical therapy Equipment/Devices: None  Discharge Condition: Able CODE STATUS: Full code Diet recommendation: Consistent carbohydrate diet  History of present illness:  [name] is a 61 y.o. male with past medical history significant for COPD, HTN, type 2 diabetes mellitus, heavy alcohol abuse, history of pancreatitis who presented to [facility] ED on 8/29 via EMS for confusion, altered mental status.  Patient was found by mother who took him to the store yesterday and reported that he was in normal state of health.  Upon EMS arrival patient was responding only to pain with empty beer cans laying around his home.  Mother reports that he is a chronic alcohol user and will drink heavily and whenever he can get his hands on any type of alcohol.  No history of toxic alcohol use.  Mother reports he has had some general decline over the past month with a gradual increasing weakness.  Mother reports does not recall him having alcohol withdrawal symptoms; but does not remember him actually ever stopping drinking for a significant period of time.   In the ED, temperature 97.7 F, HR 84, RR 11, BP 89/60, SPO2 89% on room air.  WBC 3.7, hemoglobin 12.0, platelets 268.  Sodium 138, potassium 4.2, chloride 95, CO2 29, BUN 17, creatinine 0.47.  Glucose 96.  AST 60, ALT 46, total bilirubin 1.0.  Ammonia level 17.  Lactic acid 2.2.  Acetaminophen level less than 10.  Salicylate level less than 7.0.  COVID-19 PCR negative.  Influenza A/B  PCR negative.  Urinalysis unrevealing.  EtOH level 524.  TSH 0.608.  CT head without contrast with no acute intracranial abnormality.  CT C-spine without contrast with no acute fracture/traumatic subluxation of the cervical spine, moderate degenerative changes noted.  Chest x-ray with no active cardiopulmonary disease process.  Patient received 2 L IV fluid bolus, folate, thiamine, DuoNeb treatment, cefepime, Ativan.  Then received flumazenil due to worsening mentation after receiving the Ativan for a CT scan with improvement.  PCCM was consulted and given improvement after flumazenil and believes he does not need to be in the ICU at this time and would be appropriate for the stepdown unit for now.  TRH consulted for further evaluation and management of acute metabolic encephalopathy secondary to EtOH abuse with underlying respiratory depression/hypoxia due to poor mental status/Ativan use.  Hospital course:  Acute respiratory failure with hypoxia/hypercapnia While in the ED, patient was hypoxic requiring 6-8 L nasal cannula maintain saturations.  Chest x-ray with no acute cardiopulmonary disease process.  ABG notable for hypercarbia and patient was started on BiPAP and alertness improved after receiving flumazenil as well to reverse previously given Ativan.  Underlying etiology likely secondary to his severe intoxication.  No infectious etiology found at this time.  Patient was slowly weaned off of oxygen and no desaturation was noted on ambulation by physical therapy on time of discharge.   Acute metabolic encephalopathy Severe EtOH use disorder with intoxication EtOH withdrawal Patient found at home down by mother in which she was minimally responsive on EMS arrival.  EtOH level 524 on  admission.  TSH within normal limits, no infectious etiology found.  Patient was monitored on CIWA protocol due to withdrawal symptoms which have resolved at time of discharge.  Continue encourage complete alcohol  cessation.   Hypomagnesemia Hypophosphatemia Repleted during hospitalization   Hx COPD Not oxygen dependent at baseline  Hx Essential hypertension Currently not on antihypertensives outpatient.  Patient was noted to be slightly hypotensive on admission, likely secondary to volume depletion and which responded well to IV fluid hydration.  BP normalized at time of discharge.   Lactic acidosis Lactic acid 2.2 on admission, likely secondary to volume depletion/dehydration from severe EtOH abuse/intoxication.  No infectious etiology noted on work-up.  Received IV fluid hydration on admission.   Type 2 diabetes mellitus Patient reportedly on metformin 1000 g p.o. twice daily at home.  Hemoglobin A1c 12.0 on 12/09/2020.  Repeat hemoglobin A1c 8.2.  Started on glimepiride 2 mg p.o. daily as well.  Outpatient follow-up PCP.   Nicotine use disorder Patient continues to endorse 1 pack/day.  Counseled on need for complete cessation.    Discharge Diagnoses:  Principal Problem:   Very severe alcohol intoxication with complication ([place]) Active Problems:   COPD (chronic obstructive pulmonary disease) (HCC)   Alcohol abuse   Hypertension   Type 2 diabetes mellitus with hyperglycemia (HCC)   Acute on chronic respiratory failure with hypoxia and hypercapnia (HCC)   Acute encephalopathy    Discharge Instructions  Discharge Instructions     Call MD for:  difficulty breathing, headache or visual disturbances   Complete by: As directed    Call MD for:  difficulty breathing, headache or visual disturbances   Complete by: As directed    Call MD for:  extreme fatigue   Complete by: As directed    Call MD for:  extreme fatigue   Complete by: As directed    Call MD for:  persistant dizziness or light-headedness   Complete by: As directed    Call MD for:  persistant dizziness or light-headedness   Complete by: As directed    Call MD for:  persistant nausea and vomiting   Complete by: As  directed    Call MD for:  persistant nausea and vomiting   Complete by: As directed    Call MD for:  severe uncontrolled pain   Complete by: As directed    Call MD for:  severe uncontrolled pain   Complete by: As directed    Call MD for:  temperature >100.4   Complete by: As directed    Call MD for:  temperature >100.4   Complete by: As directed    Diet - low sodium heart healthy   Complete by: As directed    Increase activity slowly   Complete by: As directed    Increase activity slowly   Complete by: As directed    No wound care   Complete by: As directed    No wound care   Complete by: As directed       Allergies as of 07/17/2022   No Known Allergies      Medication List     STOP taking these medications    cyanocobalamin 1000 MCG tablet Commonly known as: VITAMIN B12   glimepiride 2 MG tablet Commonly known as: AMARYL       TAKE these medications    atorvastatin 10 MG tablet Commonly known as: LIPITOR Take 1 tablet (10 mg total) by mouth daily.   ferrous sulfate 325 (65 FE)  MG tablet Take 1 tablet (325 mg total) by mouth daily with breakfast.   glucose blood test strip Commonly known as: True Metrix Blood Glucose Test Use as instructed-fasting before breakfast; 2 hours after largest meal of the day and before bedtime   metFORMIN 1000 MG tablet Commonly known as: GLUCOPHAGE Take 1 tablet (1,000 mg total) by mouth 2 (two) times daily with a meal.   True Metrix Air Glucose Meter w/Device Kit 1 kit by Does not apply route 2 (two) times daily at 8 am and 10 pm.   TRUEplus Lancets 28G Misc Use when checking blood sugar fasting, at bedtime and about 2 hours after largest meal of the day        Follow-up Information     Ladell Pier, MD. Schedule an appointment as soon as possible for a visit in 1 week(s).   Specialty: Internal Medicine Contact information: [address] [place] [address] [phone]                 No Known Allergies  Consultations: None   Procedures/Studies: CT Cervical Spine Wo Contrast  Result Date: 07/11/2022 CLINICAL DATA:  Altered mental status. EXAM: CT CERVICAL SPINE WITHOUT CONTRAST TECHNIQUE: Multidetector CT imaging of the cervical spine was performed without intravenous contrast. Multiplanar CT image reconstructions were also generated. RADIATION DOSE REDUCTION: This exam was performed according to the departmental dose-optimization program which includes automated exposure control, adjustment of the mA and/or kV according to patient size and/or use of iterative reconstruction technique. COMPARISON:  None Available. FINDINGS: Alignment: Normal. Skull base and vertebrae: No acute fracture. No primary bone lesion or focal pathologic process. Soft tissues and spinal canal: No prevertebral fluid or swelling. No visible canal hematoma. Disc levels: There is disc space narrowing and endplate osteophyte formation throughout the cervical spine compatible with degenerative change. Neural foraminal stenosis is seen bilaterally at C3-C4, C5-C6 and C6-C7. There is mild central canal stenosis at these levels secondary to disc osteophyte complexes. Upper chest: Negative. Other: Examination is mildly technically limited secondary to motion artifact. IMPRESSION: 1. No acute fracture or traumatic subluxation of the cervical spine. 2.  Moderate degenerative changes. Electronically Signed   By: Ronney Asters M.D.   On: 07/11/2022 20:01   CT Head Wo Contrast  Result Date: 07/11/2022 CLINICAL DATA:  ams EXAM: CT HEAD WITHOUT CONTRAST TECHNIQUE: Contiguous axial images were obtained from the base of the skull through the vertex without intravenous contrast. RADIATION DOSE REDUCTION: This exam was performed according to the departmental dose-optimization program which includes automated exposure control, adjustment of the mA and/or kV according to patient size and/or use of iterative reconstruction  technique. COMPARISON:  None Available. BRAIN: BRAIN Cerebral ventricle sizes are concordant with the degree of cerebral volume loss. Patchy and confluent areas of decreased attenuation are noted throughout the deep and periventricular white matter of the cerebral hemispheres bilaterally, compatible with chronic microvascular ischemic disease. No evidence of large-territorial acute infarction. No parenchymal hemorrhage. No mass lesion. No extra-axial collection. No mass effect or midline shift. No hydrocephalus. Basilar cisterns are patent. Vascular: No hyperdense vessel. Atherosclerotic calcifications are present within the cavernous internal carotid and vertebral arteries. Skull: No acute fracture or focal lesion. Sinuses/Orbits: Paranasal sinuses and mastoid air cells are clear. The orbits are unremarkable. Other: Tube noted coursing within the right nares and tip overlying the hypopharynx on scalp lateral view. IMPRESSION: No acute intracranial abnormality. Electronically Signed   By: Clelia Croft.D.  On: 07/11/2022 20:01   DG Chest Port 1 View  Result Date: 07/11/2022 CLINICAL DATA:  Possible sepsis. EXAM: PORTABLE CHEST 1 VIEW COMPARISON:  11/04/2013 radiograph FINDINGS: The cardiomediastinal silhouette is unremarkable. There is no evidence of focal airspace disease, pulmonary edema, suspicious pulmonary nodule/mass, pleural effusion, or pneumothorax. No acute bony abnormalities are identified. IMPRESSION: No active disease. Electronically Signed   By: Margarette Canada M.D.   On: 07/11/2022 16:46     Subjective: Patient seen examined bedside, resting comfortably.  Patient reevaluated by PT today, able to ambulate 550 feet with rolling walker. Patient wishes to discharge home.  Discussed need for complete alcohol and tobacco cessation.  No other questions or concerns at this time.  Denies headache, no dizziness, no chest pain, no palpitations, no shortness of breath, no abdominal pain, no focal  weakness, no cough/congestion, no fever/chills/night sweats, no nausea/vomiting/diarrhea, no focal weakness, no fatigue, no paresthesias.  No acute events overnight per nursing staff.  Discharge Exam: Vitals:   07/17/22 0510 07/17/22 1215  BP: 127/80 116/80  Pulse: 82 85  Resp: 19 (!) 24  Temp: 98.9 F (37.2 C) 98.6 F (37 C)  SpO2: 92% 95%   Vitals:   07/16/22 2049 07/17/22 0510 07/17/22 0843 07/17/22 1215  BP: (!) 140/93 127/80  116/80  Pulse: 100 82  85  Resp: 19 19  (!) 24  Temp: 99.3 F (37.4 C) 98.9 F (37.2 C)  98.6 F (37 C)  TempSrc: Oral Oral  Oral  SpO2: 95% 92%  95%  Weight:   55.6 kg   Height:        Physical Exam: GEN: NAD, alert and oriented x 3, chronically ill/cachectic in appearance, appears older than stated age HEENT: NCAT, PERRL, EOMI, sclera clear, poor dentition PULM: CTAB w/o wheezes/crackles, normal respiratory effort, on room air CV: RRR w/o M/G/R GI: abd soft, NTND, NABS, no R/G/M MSK: no peripheral edema, muscle strength globally intact 5/5 bilateral upper/lower extremities NEURO: CN II-XII intact, no focal deficits, sensation to light touch intact PSYCH: normal mood/affect Integumentary: dry/intact, no rashes or wounds    The results of significant diagnostics from this hospitalization (including imaging, microbiology, ancillary and laboratory) are listed below for reference.     Microbiology: Recent Results (from the past 240 hour(s))  Blood Culture (routine x 2)     Status: None   Collection Time: 07/11/22  4:15 PM   Specimen: BLOOD  Result Value Ref Range Status   Specimen Description   Final    BLOOD BLOOD RIGHT HAND Performed at [facility] [address], [place], [place] [address]    Special Requests   Final    BOTTLES DRAWN AEROBIC AND ANAEROBIC Blood Culture results may not be optimal due to an inadequate volume of blood received in culture bottles Performed at [facility]  [address], [place], [place] [address]    Culture   Final    NO GROWTH 5 DAYS Performed at [facility], [place] [address], [place], [place] [address]    Report Status 07/16/2022 FINAL  Final  Resp Panel by RT-PCR (Flu A&B, Covid) Anterior Nasal Swab     Status: None   Collection Time: 07/11/22  4:16 PM   Specimen: Anterior Nasal Swab  Result Value Ref Range Status   SARS Coronavirus 2 by RT PCR NEGATIVE NEGATIVE Final    Comment: (NOTE) SARS-CoV-2 target nucleic acids are NOT DETECTED.  The SARS-CoV-2 RNA is generally  detectable in upper respiratory specimens during the acute phase of infection. The lowest concentration of SARS-CoV-2 viral copies this assay can detect is 138 copies/mL. A negative result does not preclude SARS-Cov-2 infection and should not be used as the sole basis for treatment or other patient management decisions. A negative result may occur with  improper specimen collection/handling, submission of specimen other than nasopharyngeal swab, presence of viral mutation(s) within the areas targeted by this assay, and inadequate number of viral copies(<138 copies/mL). A negative result must be combined with clinical observations, patient history, and epidemiological information. The expected result is Negative.  Fact Sheet for Patients:  EntrepreneurPulse.com.au  Fact Sheet for Healthcare Providers:  IncredibleEmployment.be  This test is no t yet approved or cleared by the [place] FDA and  has been authorized for detection and/or diagnosis of SARS-CoV-2 by FDA under an Emergency Use Authorization (EUA). This EUA will remain  in effect (meaning this test can be used) for the duration of the COVID-19 declaration under Section 564(b)(1) of the Act, 21 U.S.C.section 360bbb-3(b)(1), unless the authorization is terminated  or revoked sooner.       Influenza A by PCR NEGATIVE NEGATIVE Final   Influenza B by PCR NEGATIVE  NEGATIVE Final    Comment: (NOTE) The Xpert Xpress SARS-CoV-2/FLU/RSV plus assay is intended as an aid in the diagnosis of influenza from Nasopharyngeal swab specimens and should not be used as a sole basis for treatment. Nasal washings and aspirates are unacceptable for Xpert Xpress SARS-CoV-2/FLU/RSV testing.  Fact Sheet for Patients: EntrepreneurPulse.com.au  Fact Sheet for Healthcare Providers: IncredibleEmployment.be  This test is not yet approved or cleared by the [place] FDA and has been authorized for detection and/or diagnosis of SARS-CoV-2 by FDA under an Emergency Use Authorization (EUA). This EUA will remain in effect (meaning this test can be used) for the duration of the COVID-19 declaration under Section 564(b)(1) of the Act, 21 U.S.C. section 360bbb-3(b)(1), unless the authorization is terminated or revoked.  Performed at [facility], [place] [address] [place], [place] [address]   Blood Culture (routine x 2)     Status: None   Collection Time: 07/11/22  4:20 PM   Specimen: BLOOD  Result Value Ref Range Status   Specimen Description   Final    BLOOD LEFT ANTECUBITAL Performed at [facility] [address], [place], [place] [address]    Special Requests   Final    BOTTLES DRAWN AEROBIC AND ANAEROBIC Blood Culture adequate volume Performed at [facility] [address], [place], [place] [address]    Culture   Final    NO GROWTH 5 DAYS Performed at [facility], [place] [address], [place], [place] [address]    Report Status 07/16/2022 FINAL  Final  Urine Culture     Status: None   Collection Time: 07/11/22  9:41 PM   Specimen: In/Out Cath Urine  Result Value Ref Range Status   Specimen Description   Final    IN/OUT CATH URINE Performed at [facility] [address] [place], [place] [address]    Special Requests   Final     NONE Performed at [facility], [place] [address] [place], [place] [address]    Culture   Final    NO GROWTH Performed at [facility], [place] [address], [place], [place] [address]    Report Status 07/12/2022 FINAL  Final  MRSA Next Gen  by PCR, Nasal     Status: None   Collection Time: 07/12/22  4:03 AM   Specimen: Nasal Mucosa; Nasal Swab  Result Value Ref Range Status   MRSA by PCR Next Gen NOT DETECTED NOT DETECTED Final    Comment: (NOTE) The GeneXpert MRSA Assay (FDA approved for NASAL specimens only), is one component of a comprehensive MRSA colonization surveillance program. It is not intended to diagnose MRSA infection nor to guide or monitor treatment for MRSA infections. Test performance is not FDA approved in patients less than 64 years old. Performed at [facility], [place] [address] [place], [place] [address]      Labs: BNP (last 3 results) No results for input(s): "BNP" in the last 8760 hours. Basic Metabolic Panel: Recent Labs  Lab 07/11/22 1634 07/12/22 0258 07/13/22 0306 07/14/22 0447 07/16/22 0359 07/17/22 0330  NA  --  142 133* 132* 135 134*  K  --  3.9 3.8 3.3* 3.8 3.9  CL  --  101 94* 88* 98 99  CO2  --  28 32 34* 31 29  GLUCOSE  --  104* 180* 142* 158* 237*  BUN  --  16 12 6* 18 16  CREATININE  --  0.42* 0.42* 0.33* 0.43* 0.66  CALCIUM  --  7.8* 8.2* 8.4* 8.7* 8.4*  MG 1.8  --  1.3* 1.3* 1.5* 1.6*  PHOS  --  3.2 2.1* 3.6 4.1  --    Liver Function Tests: Recent Labs  Lab 07/11/22 1615 07/12/22 0258  AST 60* 43*  ALT 46* 37  ALKPHOS 120 97  BILITOT 1.0 0.8  PROT 7.1 6.1*  ALBUMIN 4.0 3.4*   Recent Labs  Lab 07/11/22 1615  LIPASE 19   Recent Labs  Lab 07/11/22 1617  AMMONIA 17   CBC: Recent Labs  Lab 07/11/22 1615 07/12/22 0258 07/13/22 0306  WBC 3.7* 5.3 6.0  NEUTROABS 2.6  --   --   HGB 12.0* 10.4* 9.2*  HCT 38.1* 33.2* 29.5*  MCV 84.5 84.9 84.8  PLT 268 223 179   Cardiac  Enzymes: No results for input(s): "CKTOTAL", "CKMB", "CKMBINDEX", "TROPONINI" in the last 168 hours. BNP: Invalid input(s): "POCBNP" CBG: Recent Labs  Lab 07/16/22 1549 07/16/22 1932 07/17/22 0042 07/17/22 0740 07/17/22 1211  GLUCAP 203* 200* 268* 177* 186*   D-Dimer No results for input(s): "DDIMER" in the last 72 hours. Hgb A1c No results for input(s): "HGBA1C" in the last 72 hours.  Lipid Profile No results for input(s): "CHOL", "HDL", "LDLCALC", "TRIG", "CHOLHDL", "LDLDIRECT" in the last 72 hours. Thyroid function studies No results for input(s): "TSH", "T4TOTAL", "T3FREE", "THYROIDAB" in the last 72 hours.  Invalid input(s): "FREET3"  Anemia work up No results for input(s): "VITAMINB12", "FOLATE", "FERRITIN", "TIBC", "IRON", "RETICCTPCT" in the last 72 hours. Urinalysis    Component Value Date/Time   COLORURINE YELLOW 07/11/2022 2132   APPEARANCEUR CLEAR 07/11/2022 2132   LABSPEC 1.010 07/11/2022 2132   PHURINE 5.0 07/11/2022 2132   GLUCOSEU NEGATIVE 07/11/2022 2132   HGBUR MODERATE (A) 07/11/2022 2132   BILIRUBINUR NEGATIVE 07/11/2022 2132   BILIRUBINUR negative 09/17/2018 1635   KETONESUR 5 (A) 07/11/2022 2132   PROTEINUR NEGATIVE 07/11/2022 2132   UROBILINOGEN 0.2 09/17/2018 1635   UROBILINOGEN 1.0 11/04/2013 1736   NITRITE NEGATIVE 07/11/2022 2132   LEUKOCYTESUR NEGATIVE 07/11/2022 2132   Sepsis Labs Recent Labs  Lab 07/11/22 1615 07/12/22 0258 07/13/22 0306  WBC 3.7* 5.3 6.0   Microbiology Recent Results (  from the past 240 hour(s))  Blood Culture (routine x 2)     Status: None   Collection Time: 07/11/22  4:15 PM   Specimen: BLOOD  Result Value Ref Range Status   Specimen Description   Final    BLOOD BLOOD RIGHT HAND Performed at [facility] [address] [place], [place] [address]    Special Requests   Final    BOTTLES DRAWN AEROBIC AND ANAEROBIC Blood Culture results may not be optimal due to an inadequate volume of  blood received in culture bottles Performed at [facility] [address], [place], [place] [address]    Culture   Final    NO GROWTH 5 DAYS Performed at [facility], [place] [address] [place], [place] [address]    Report Status 07/16/2022 FINAL  Final  Resp Panel by RT-PCR (Flu A&B, Covid) Anterior Nasal Swab     Status: None   Collection Time: 07/11/22  4:16 PM   Specimen: Anterior Nasal Swab  Result Value Ref Range Status   SARS Coronavirus 2 by RT PCR NEGATIVE NEGATIVE Final    Comment: (NOTE) SARS-CoV-2 target nucleic acids are NOT DETECTED.  The SARS-CoV-2 RNA is generally detectable in upper respiratory specimens during the acute phase of infection. The lowest concentration of SARS-CoV-2 viral copies this assay can detect is 138 copies/mL. A negative result does not preclude SARS-Cov-2 infection and should not be used as the sole basis for treatment or other patient management decisions. A negative result may occur with  improper specimen collection/handling, submission of specimen other than nasopharyngeal swab, presence of viral mutation(s) within the areas targeted by this assay, and inadequate number of viral copies(<138 copies/mL). A negative result must be combined with clinical observations, patient history, and epidemiological information. The expected result is Negative.  Fact Sheet for Patients:  EntrepreneurPulse.com.au  Fact Sheet for Healthcare Providers:  IncredibleEmployment.be  This test is no t yet approved or cleared by the [place] FDA and  has been authorized for detection and/or diagnosis of SARS-CoV-2 by FDA under an Emergency Use Authorization (EUA). This EUA will remain  in effect (meaning this test can be used) for the duration of the COVID-19 declaration under Section 564(b)(1) of the Act, 21 U.S.C.section 360bbb-3(b)(1), unless the authorization is terminated  or revoked  sooner.       Influenza A by PCR NEGATIVE NEGATIVE Final   Influenza B by PCR NEGATIVE NEGATIVE Final    Comment: (NOTE) The Xpert Xpress SARS-CoV-2/FLU/RSV plus assay is intended as an aid in the diagnosis of influenza from Nasopharyngeal swab specimens and should not be used as a sole basis for treatment. Nasal washings and aspirates are unacceptable for Xpert Xpress SARS-CoV-2/FLU/RSV testing.  Fact Sheet for Patients: EntrepreneurPulse.com.au  Fact Sheet for Healthcare Providers: IncredibleEmployment.be  This test is not yet approved or cleared by the [place] FDA and has been authorized for detection and/or diagnosis of SARS-CoV-2 by FDA under an Emergency Use Authorization (EUA). This EUA will remain in effect (meaning this test can be used) for the duration of the COVID-19 declaration under Section 564(b)(1) of the Act, 21 U.S.C. section 360bbb-3(b)(1), unless the authorization is terminated or revoked.  Performed at [facility], [place] [address] [place], [place] [address]   Blood Culture (routine x 2)     Status: None   Collection Time: 07/11/22  4:20 PM   Specimen: BLOOD  Result Value Ref Range Status  Specimen Description   Final    BLOOD LEFT ANTECUBITAL Performed at [facility] [address], [place], [place] [address]    Special Requests   Final    BOTTLES DRAWN AEROBIC AND ANAEROBIC Blood Culture adequate volume Performed at [facility] [address] [place], [place] [address]    Culture   Final    NO GROWTH 5 DAYS Performed at [facility], [place] [address] [place], [place] [address]    Report Status 07/16/2022 FINAL  Final  Urine Culture     Status: None   Collection Time: 07/11/22  9:41 PM   Specimen: In/Out Cath Urine  Result Value Ref Range Status   Specimen Description   Final    IN/OUT CATH URINE Performed at [facility] [address], [place], [place] [address]    Special Requests   Final    NONE Performed at [facility], [place] [address], [place], [place] [address]    Culture   Final    NO GROWTH Performed at [facility], [place] [address], [place], [place] [address]    Report Status 07/12/2022 FINAL  Final  MRSA Next Gen by PCR, Nasal     Status: None   Collection Time: 07/12/22  4:03 AM   Specimen: Nasal Mucosa; Nasal Swab  Result Value Ref Range Status   MRSA by PCR Next Gen NOT DETECTED NOT DETECTED Final    Comment: (NOTE) The GeneXpert MRSA Assay (FDA approved for NASAL specimens only), is one component of a comprehensive MRSA colonization surveillance program. It is not intended to diagnose MRSA infection nor to guide or monitor treatment for MRSA infections. Test performance is not FDA approved in patients less than 1 years old. Performed at [facility], [place] [address], [place], [place] [address]      Time coordinating discharge: Over 30 minutes  SIGNED:   Hammad Finkler J [place] DO  [facility] 07/17/2022, 3:53 PM

## 2022-07-14 NOTE — Progress Notes (Signed)
Author attempted to assist pt into mother's car for discharge. Pt was a max 2- assist out of wheelchair into car. Pt was not able to lift himself off the wheelchair or transfer into car. Chartered loss adjuster and nurse tech assisted pt back into room. MD made aware.  [name]

## 2022-07-14 NOTE — Progress Notes (Signed)
Report called to 4w, transferring patient  now.

## 2022-07-14 NOTE — Progress Notes (Signed)
4W call for report but the nurse said she will call back.

## 2022-07-14 NOTE — Progress Notes (Signed)
Physical Therapy Treatment Patient Details Name: [name] MRN: [id number] DOB: [birth year] Today's Date: 07/14/2022   History of Present Illness 61 yo male admitted with severe ETOH intoxication, acute respiratory failure. Hx of ETOH abuse, COPD, pancreatitis, DM    PT Comments    General Comments: AxO x 2 easily awoke but slow to respond.  Required repeat VC's to complete task.  Present with tremors and Alcohol Ataxia.  Pt having difficulty feeding himself.  Incont BB. Assisted OOB to amb was VERY difficult.  General bed mobility comments: Pt required increased time and MAX Assist to attempt supine to sit EOB.  Pt present with ataxia, tremors and severe posterior lean.  Unable to static sit EOB self required MAX Assist. General transfer comment: severe posterior lean and ataxia.  Required + 2 assist to stand using gait pt to "pull pt forward".  Also assisted with a BSC tranfer.  Pt unaware he was incont BM. General Gait Details: VERY unsteady gait requiring + 2 side by side asisst as well as pulling his safety belt forward due to severe posterior lean.  HIGH FALL RISK. Pt lives home alone.  Currently he needs [facility].  SATURATION QUALIFICATIONS: (This note is used to comply with regulatory documentation for home oxygen)  Patient Saturations on Room Air at Rest = 94%  Patient Saturations on Room Air while Ambulating = 96%  Please briefly explain why patient needs home oxygen:  Pt does NOT require supplemental oxygen    Recommendations for follow up therapy are one component of a multi-disciplinary discharge planning process, led by the attending physician.  Recommendations may be updated based on patient status, additional functional criteria and insurance authorization.  Follow Up Recommendations  No PT follow up     Assistance Recommended at Discharge PRN  Patient can return home with the following A little help with walking and/or transfers;A little help with  bathing/dressing/bathroom   Equipment Recommendations       Recommendations for Other Services       Precautions / Restrictions Precautions Precautions: Fall Precaution Comments: monitor HR and O2 Restrictions Weight Bearing Restrictions: No     Mobility  Bed Mobility Overal bed mobility: Needs Assistance Bed Mobility: Supine to Sit     Supine to sit: Max assist     General bed mobility comments: Pt required increased time and MAX Assist to attempt supine to sit EOB.  Pt present with ataxia, tremors and severe posterior lean.  Unable to static sit EOB self required MAX Assist.    Transfers Overall transfer level: Needs assistance Equipment used: None Transfers: Sit to/from Stand Sit to Stand: Max assist, +2 physical assistance, +2 safety/equipment           General transfer comment: severe posterior lean and ataxia.  Required + 2 assist to stand using gait pt to "pull pt forward".  Also assisted with a BSC tranfer.  Pt unaware he was incont BM.    Ambulation/Gait Ambulation/Gait assistance: Max assist, Total assist, +2 physical assistance, +2 safety/equipment Gait Distance (Feet): 8 Feet Assistive device: None, 2 person hand held assist Gait Pattern/deviations: Step-to pattern, Ataxic, Festinating, Decreased step length - right, Decreased step length - left Gait velocity: decreased     General Gait Details: VERY unsteady gait requiring + 2 side by side asisst as well as pulling his safety belt forward due to severe poaterior lean.  HIGH FALL RISK.   Stairs  Wheelchair Mobility    Modified Rankin (Stroke Patients Only)       Balance                                            Cognition Arousal/Alertness: Awake/alert Behavior During Therapy: Flat affect Overall Cognitive Status: Impaired/Different from baseline Area of Impairment: Attention, Memory, Safety/judgement, Awareness, Problem solving                                General Comments: AxO x 2 easily awoke but slow to respond.  Required repeat VC's to complete task.  Present with tremors and Alcohol Ataxia.  Pt having difficulty feeding himself.  Incont BB.        Exercises      General Comments        Pertinent Vitals/Pain Pain Assessment Pain Assessment: No/denies pain    Home Living                          Prior Function            PT Goals (current goals can now be found in the care plan section) Progress towards PT goals: Progressing toward goals    Frequency    Min 3X/week      PT Plan Current plan remains appropriate    Co-evaluation              AM-PAC PT "6 Clicks" Mobility   Outcome Measure  Help needed turning from your back to your side while in a flat bed without using bedrails?: Total Help needed moving from lying on your back to sitting on the side of a flat bed without using bedrails?: Total Help needed moving to and from a bed to a chair (including a wheelchair)?: Total Help needed standing up from a chair using your arms (e.g., wheelchair or bedside chair)?: Total Help needed to walk in hospital room?: Total Help needed climbing 3-5 steps with a railing? : Total 6 Click Score: 6    End of Session Equipment Utilized During Treatment: Gait belt Activity Tolerance: Other (comment) (ataxia) Patient left: in chair;with chair alarm set;with call bell/phone within reach Nurse Communication: Mobility status PT Visit Diagnosis: Difficulty in walking, not elsewhere classified (R26.2)     Time: 3748-2707 PT Time Calculation (min) (ACUTE ONLY): 36 min  Charges:  $Gait Training: 8-22 mins $Therapeutic Activity: 8-22 mins                     Felecia Shelling  PTA Acute  Rehabilitation Services Office M-F          [phone] Weekend pager [phone]

## 2022-07-14 NOTE — Plan of Care (Signed)
  Problem: Education: Goal: Knowledge of General Education information will improve Description Including pain rating scale, medication(s)/side effects and non-pharmacologic comfort measures Outcome: Progressing   Problem: Health Behavior/Discharge Planning: Goal: Ability to manage health-related needs will improve Outcome: Progressing   

## 2022-07-14 NOTE — Progress Notes (Signed)
  Transition of Care [facility]) Screening Note   Patient Details  Name: [name] Date of Birth: [birth year]   Transition of Care [facility]) CM/SW Contact:    Lavenia Atlas, RN Phone Number: 07/14/2022, 3:53 PM    Transition of Care Department [facility]) has reviewed patient and no TOC needs have been identified at this time. We will continue to monitor patient advancement through interdisciplinary progression rounds. If new patient transition needs arise, please place a TOC consult.

## 2022-07-14 NOTE — Progress Notes (Signed)
This RN tried to reach family members to inform them about patient being transfer to 4w but both mom and uncle did not answer their phones. We continue to monitor.

## 2022-07-15 LAB — GLUCOSE, CAPILLARY
Glucose-Capillary: 147 mg/dL — ABNORMAL HIGH (ref 70–99)
Glucose-Capillary: 155 mg/dL — ABNORMAL HIGH (ref 70–99)
Glucose-Capillary: 182 mg/dL — ABNORMAL HIGH (ref 70–99)
Glucose-Capillary: 188 mg/dL — ABNORMAL HIGH (ref 70–99)
Glucose-Capillary: 216 mg/dL — ABNORMAL HIGH (ref 70–99)
Glucose-Capillary: 285 mg/dL — ABNORMAL HIGH (ref 70–99)
Glucose-Capillary: 332 mg/dL — ABNORMAL HIGH (ref 70–99)

## 2022-07-15 MED ORDER — METOPROLOL TARTRATE 25 MG PO TABS
12.5000 mg | ORAL_TABLET | Freq: Two times a day (BID) | ORAL | Status: DC
  Administered 2022-07-15 – 2022-07-17 (×5): 12.5 mg via ORAL
  Filled 2022-07-15 (×5): qty 1

## 2022-07-15 MED ORDER — LOPERAMIDE HCL 2 MG PO CAPS
2.0000 mg | ORAL_CAPSULE | ORAL | Status: DC | PRN
Start: 2022-07-15 — End: 2022-07-17

## 2022-07-15 MED ORDER — METFORMIN HCL 500 MG PO TABS
1000.0000 mg | ORAL_TABLET | Freq: Two times a day (BID) | ORAL | Status: DC
  Administered 2022-07-15 – 2022-07-17 (×5): 1000 mg via ORAL
  Filled 2022-07-15 (×5): qty 2

## 2022-07-15 NOTE — Progress Notes (Addendum)
PROGRESS NOTE    [name]  [id number] DOB: 01/02/1961 DOA: 07/11/2022 PCP: Ladell Pier, MD    Brief Narrative:   [name] is a 61 y.o. male with past medical history significant for COPD, HTN, type 2 diabetes mellitus, heavy alcohol abuse, history of pancreatitis who presented to [facility] ED on 8/29 via EMS for confusion, altered mental status.  Patient was found by mother who took him to the store yesterday and reported that he was in normal state of health.  Upon EMS arrival patient was responding only to pain with empty beer cans laying around his home.  Mother reports that he is a chronic alcohol user and will drink heavily and whenever he can get his hands on any type of alcohol.  No history of toxic alcohol use.  Mother reports he has had some general decline over the past month with a gradual increasing weakness.  Mother reports does not recall him having alcohol withdrawal symptoms; but does not remember him actually ever stopping drinking for a significant period of time.  In the ED, temperature 97.7 F, HR 84, RR 11, BP 89/60, SPO2 89% on room air.  WBC 3.7, hemoglobin 12.0, platelets 268.  Sodium 138, potassium 4.2, chloride 95, CO2 29, BUN 17, creatinine 0.47.  Glucose 96.  AST 60, ALT 46, total bilirubin 1.0.  Ammonia level 17.  Lactic acid 2.2.  Acetaminophen level less than 10.  Salicylate level less than 7.0.  COVID-19 PCR negative.  Influenza A/B PCR negative.  Urinalysis unrevealing.  EtOH level 524.  TSH 0.608.  CT head without contrast with no acute intracranial abnormality.  CT C-spine without contrast with no acute fracture/traumatic subluxation of the cervical spine, moderate degenerative changes noted.  Chest x-ray with no active cardiopulmonary disease process.  Patient received 2 L IV fluid bolus, folate, thiamine, DuoNeb treatment, cefepime, Ativan.  Then received flumazenil due to worsening mentation after receiving the Ativan for a CT scan with  improvement.  PCCM was consulted and given improvement after flumazenil and believes he does not need to be in the ICU at this time and would be appropriate for the stepdown unit for now.  TRH consulted for further evaluation and management of acute metabolic encephalopathy secondary to EtOH abuse with underlying respiratory depression/hypoxia due to poor mental status/Ativan use.  Assessment & Plan:   Acute respiratory failure with hypoxia/hypercapnia: Resolved While in the ED, patient was hypoxic requiring 6-8 L nasal cannula maintain saturations.  Chest x-ray with no acute cardiopulmonary disease process.  ABG notable for hypercarbia and patient was started on BiPAP and alertness improved after receiving flumazenil as well to reverse previously given Ativan.  Underlying etiology likely secondary to his severe intoxication.  No infectious etiology found at this time.  Now weaned off of supplemental oxygen. --Incentive spirometry  Acute metabolic encephalopathy Severe EtOH use disorder with intoxication Concern for impending EtOH withdrawal Patient found at home down by mother in which she was minimally responsive on EMS arrival.  EtOH level 524 on admission.  TSH within normal limits, no infectious etiology found. --EtoH 524>199, now <10 --CIWA protocol with symptom triggered Ativan --Thiamine, folate, multivitamin --Supportive care, remain in stepdown unit for concern of severe and impending withdrawal symptoms  Hypomagnesemia Hypophosphatemia Repleted during hospitalization -- Repeat electrolytes in a.m.  Hx COPD Not oxygen dependent at baseline --Continue supplemental oxygen as above, maintain SPO2 >88% --DuoNebs as needed  Hx Essential hypertension Currently not on antihypertensives outpatient.  Patient  was noted to be slightly hypotensive on admission, likely secondary to volume depletion and which responded well to IV fluid hydration. --Monitor BP closely  Lactic  acidosis Lactic acid 2.2 on admission, likely secondary to volume depletion/dehydration from severe EtOH abuse/intoxication.  No infectious etiology noted on work-up.  Received IV fluid hydration on admission. --Repeat lactic acid in the a.m.  Type 2 diabetes mellitus Patient reportedly on metformin 1000 mg p.o. twice daily at home.  Hemoglobin A1c 12.0 on 12/09/2020.  Repeat hemoglobin A1c 8.2. --restart home metformin 1000mg  PO BID today --SSI for coverage --CBGs qAC/HS  Nicotine use disorder Patient continues to endorse 1 pack/day.  Counseled on need for complete cessation. --Nicotine patch  Weakness/debility/deconditioning: Patient was seen by PT, was max assistance and unable to transfer himself. --OT consultation -- Will need SNF placement, TOC consulted   DVT prophylaxis: enoxaparin (LOVENOX) injection 40 mg Start: 07/12/22 1000    Code Status: Full Code Family Communication: No family present at bedside this morning  Disposition Plan:  Level of care: Progressive Status is: Inpatient Remains inpatient appropriate because: Medically stable for SNF placement once bed available      Consultants:  Case was discussed with PCCM on admission by admitting physician  Procedures:  None  Antimicrobials:  Cefepime 8/29 x 1 dose   Subjective: Patient seen examined bedside, resting comfortably.  Eating breakfast.  Discussed given that he cannot ambulate and requires max assist x2 patient will require SNF rehabilitation before stable for discharge back home.  Patient amenable to this.  No other specific questions or concerns at this time. Denies headache, no dizziness, no chest pain, no shortness of breath, no abdominal pain.  No other acute events overnight per nursing staff.  Objective: Vitals:   07/14/22 1829 07/14/22 2002 07/15/22 0415 07/15/22 1019  BP: 116/78 100/72 109/67 112/71  Pulse: 87 100 97 (!) 110  Resp: 16 20 20    Temp: 98.4 F (36.9 C) 98 F (36.7 C) 99.8  F (37.7 C)   TempSrc: Oral Oral Oral   SpO2: 93% 93% 91%   Weight:      Height:        Intake/Output Summary (Last 24 hours) at 07/15/2022 1241 Last data filed at 07/15/2022 1122 Gross per 24 hour  Intake 3 ml  Output 250 ml  Net -247 ml   Filed Weights   07/12/22 0100 07/13/22 0500  Weight: 82.5 kg 83 kg    Examination:  Physical Exam: GEN: NAD, alert and oriented x3, chronically ill/cachectic in appearance, appears older than stated age HEENT: NCAT, PERRL, EOMI, sclera clear, dry mucous membranes, poor dentition PULM: CTAB w/o wheezes/crackles, normal respiratory effort, on room air CV: RRR w/o M/G/R GI: abd soft, NTND, NABS, no R/G/M MSK: no peripheral edema, moves all extremities independently NEURO: CN II-XII intact, no focal deficits, sensation to light touch intact PSYCH: normal mood/affect Integumentary: dry/intact, no rashes or wounds    Data Reviewed: I have personally reviewed following labs and imaging studies  CBC: Recent Labs  Lab 07/11/22 1615 07/12/22 0258 07/13/22 0306  WBC 3.7* 5.3 6.0  NEUTROABS 2.6  --   --   HGB 12.0* 10.4* 9.2*  HCT 38.1* 33.2* 29.5*  MCV 84.5 84.9 84.8  PLT 268 223 0000000   Basic Metabolic Panel: Recent Labs  Lab 07/11/22 1615 07/11/22 1634 07/12/22 0258 07/13/22 0306 07/14/22 0447  NA 138  --  142 133* 132*  K 4.2  --  3.9 3.8 3.3*  CL  95*  --  101 94* 88*  CO2 29  --  28 32 34*  GLUCOSE 96  --  104* 180* 142*  BUN 17  --  16 12 6*  CREATININE 0.47*  --  0.42* 0.42* 0.33*  CALCIUM 7.9*  --  7.8* 8.2* 8.4*  MG  --  1.8  --  1.3* 1.3*  PHOS  --   --  3.2 2.1* 3.6   GFR: Estimated Creatinine Clearance: 103.7 mL/min (A) (by C-G formula based on SCr of 0.33 mg/dL (L)). Liver Function Tests: Recent Labs  Lab 07/11/22 1615 07/12/22 0258  AST 60* 43*  ALT 46* 37  ALKPHOS 120 97  BILITOT 1.0 0.8  PROT 7.1 6.1*  ALBUMIN 4.0 3.4*   Recent Labs  Lab 07/11/22 1615  LIPASE 19   Recent Labs  Lab  07/11/22 1617  AMMONIA 17   Coagulation Profile: Recent Labs  Lab 07/11/22 1615  INR 1.0   Cardiac Enzymes: No results for input(s): "CKTOTAL", "CKMB", "CKMBINDEX", "TROPONINI" in the last 168 hours. BNP (last 3 results) No results for input(s): "PROBNP" in the last 8760 hours. HbA1C: Recent Labs    07/13/22 0306  HGBA1C 8.2*   CBG: Recent Labs  Lab 07/14/22 2000 07/15/22 0000 07/15/22 0409 07/15/22 0816 07/15/22 1145  GLUCAP 206* 155* 182* 147* 332*   Lipid Profile: No results for input(s): "CHOL", "HDL", "LDLCALC", "TRIG", "CHOLHDL", "LDLDIRECT" in the last 72 hours. Thyroid Function Tests: No results for input(s): "TSH", "T4TOTAL", "FREET4", "T3FREE", "THYROIDAB" in the last 72 hours.  Anemia Panel: No results for input(s): "VITAMINB12", "FOLATE", "FERRITIN", "TIBC", "IRON", "RETICCTPCT" in the last 72 hours. Sepsis Labs: Recent Labs  Lab 07/11/22 1615 07/11/22 2156 07/13/22 0306  LATICACIDVEN 2.2* 2.4* 0.9    Recent Results (from the past 240 hour(s))  Blood Culture (routine x 2)     Status: None (Preliminary result)   Collection Time: 07/11/22  4:15 PM   Specimen: BLOOD  Result Value Ref Range Status   Specimen Description   Final    BLOOD BLOOD RIGHT HAND Performed at [facility], [address] [place], [place] [address]    Special Requests   Final    BOTTLES DRAWN AEROBIC AND ANAEROBIC Blood Culture results may not be optimal due to an inadequate volume of blood received in culture bottles Performed at [facility], [address], [place], [place] [address]    Culture   Final    NO GROWTH 4 DAYS Performed at [facility], [address] [place], [place] [address]    Report Status PENDING  Incomplete  Resp Panel by RT-PCR (Flu A&B, Covid) Anterior Nasal Swab     Status: None   Collection Time: 07/11/22  4:16 PM   Specimen: Anterior Nasal Swab  Result Value Ref Range Status   SARS Coronavirus 2  by RT PCR NEGATIVE NEGATIVE Final    Comment: (NOTE) SARS-CoV-2 target nucleic acids are NOT DETECTED.  The SARS-CoV-2 RNA is generally detectable in upper respiratory specimens during the acute phase of infection. The lowest concentration of SARS-CoV-2 viral copies this assay can detect is 138 copies/mL. A negative result does not preclude SARS-Cov-2 infection and should not be used as the sole basis for treatment or other patient management decisions. A negative result may occur with  improper specimen collection/handling, submission of specimen other than nasopharyngeal swab, presence of viral mutation(s) within the areas targeted by this assay, and inadequate number of  viral copies(<138 copies/mL). A negative result must be combined with clinical observations, patient history, and epidemiological information. The expected result is Negative.  Fact Sheet for Patients:  BloggerCourse.com  Fact Sheet for Healthcare Providers:  SeriousBroker.it  This test is no t yet approved or cleared by the [place] FDA and  has been authorized for detection and/or diagnosis of SARS-CoV-2 by FDA under an Emergency Use Authorization (EUA). This EUA will remain  in effect (meaning this test can be used) for the duration of the COVID-19 declaration under Section 564(b)(1) of the Act, 21 U.S.C.section 360bbb-3(b)(1), unless the authorization is terminated  or revoked sooner.       Influenza A by PCR NEGATIVE NEGATIVE Final   Influenza B by PCR NEGATIVE NEGATIVE Final    Comment: (NOTE) The Xpert Xpress SARS-CoV-2/FLU/RSV plus assay is intended as an aid in the diagnosis of influenza from Nasopharyngeal swab specimens and should not be used as a sole basis for treatment. Nasal washings and aspirates are unacceptable for Xpert Xpress SARS-CoV-2/FLU/RSV testing.  Fact Sheet for Patients: BloggerCourse.com  Fact  Sheet for Healthcare Providers: SeriousBroker.it  This test is not yet approved or cleared by the [place] FDA and has been authorized for detection and/or diagnosis of SARS-CoV-2 by FDA under an Emergency Use Authorization (EUA). This EUA will remain in effect (meaning this test can be used) for the duration of the COVID-19 declaration under Section 564(b)(1) of the Act, 21 U.S.C. section 360bbb-3(b)(1), unless the authorization is terminated or revoked.  Performed at [facility], [address], [place], [place] [address]   Blood Culture (routine x 2)     Status: None (Preliminary result)   Collection Time: 07/11/22  4:20 PM   Specimen: BLOOD  Result Value Ref Range Status   Specimen Description   Final    BLOOD LEFT ANTECUBITAL Performed at [facility], [address] [place], [place] [address]    Special Requests   Final    BOTTLES DRAWN AEROBIC AND ANAEROBIC Blood Culture adequate volume Performed at [facility], [address], [place], [place] [address]    Culture   Final    NO GROWTH 4 DAYS Performed at [facility], [address] [place], [place] [address]    Report Status PENDING  Incomplete  Urine Culture     Status: None   Collection Time: 07/11/22  9:41 PM   Specimen: In/Out Cath Urine  Result Value Ref Range Status   Specimen Description   Final    IN/OUT CATH URINE Performed at [facility], [address] [place], [place] [address]    Special Requests   Final    NONE Performed at [facility], [address] [place], [place] [address]    Culture   Final    NO GROWTH Performed at [facility], [address], [place], [place] [address]    Report Status 07/12/2022 FINAL  Final  MRSA Next Gen by PCR, Nasal     Status: None   Collection Time: 07/12/22  4:03 AM   Specimen: Nasal Mucosa; Nasal Swab  Result Value  Ref Range Status   MRSA by PCR Next Gen NOT DETECTED NOT DETECTED Final    Comment: (NOTE) The GeneXpert MRSA Assay (FDA approved for NASAL specimens only), is one component of a comprehensive MRSA colonization surveillance program. It is not intended to diagnose MRSA infection nor to guide or monitor  treatment for MRSA infections. Test performance is not FDA approved in patients less than 80 years old. Performed at [facility], [place] [address], [place], [place] [address]          Radiology Studies: No results found.      Scheduled Meds:  Chlorhexidine Gluconate Cloth  6 each Topical Q0600   cyanocobalamin  1,000 mcg Oral Daily   enoxaparin (LOVENOX) injection  40 mg Subcutaneous Q24H   ferrous sulfate  325 mg Oral Q breakfast   folic acid  1 mg Oral Once   folic acid  1 mg Oral Daily   insulin aspart  0-9 Units Subcutaneous Q4H   metoprolol tartrate  12.5 mg Oral BID   multivitamin with minerals  1 tablet Oral Daily   nicotine  21 mg Transdermal Daily   mouth rinse  15 mL Mouth Rinse 4 times per day   sodium chloride flush  3 mL Intravenous Q12H   thiamine  100 mg Oral Daily   Or   thiamine  100 mg Intravenous Daily   Continuous Infusions:     LOS: 3 days    Time spent: 49 minutes spent on chart review, discussion with nursing staff, consultants, updating family and interview/physical exam; more than 50% of that time was spent in counseling and/or coordination of care.    Adriella Essex J [place] DO [facility] Available via Epic secure chat 7am-7pm After these hours, please refer to coverage provider listed on [email or web address] 07/15/2022, 12:41 PM

## 2022-07-15 NOTE — Progress Notes (Signed)
PROGRESS NOTE    [name]  HQI:[id number] DOB: [birth year] DOA: 07/11/2022 PCP: Nathan Matar, MD    Brief Narrative:   [name] is a 61 y.o. male with past medical history significant for COPD, HTN, type 2 diabetes mellitus, heavy alcohol abuse, history of pancreatitis who presented to [facility] ED on 8/29 via EMS for confusion, altered mental status.  Patient was found by mother who took him to the store yesterday and reported that he was in normal state of health.  Upon EMS arrival patient was responding only to pain with empty beer cans laying around his home.  Mother reports that he is a chronic alcohol user and will drink heavily and whenever he can get his hands on any type of alcohol.  No history of toxic alcohol use.  Mother reports he has had some general decline over the past month with a gradual increasing weakness.  Mother reports does not recall him having alcohol withdrawal symptoms; but does not remember him actually ever stopping drinking for a significant period of time.  In the ED, temperature 97.7 F, HR 84, RR 11, BP 89/60, SPO2 89% on room air.  WBC 3.7, hemoglobin 12.0, platelets 268.  Sodium 138, potassium 4.2, chloride 95, CO2 29, BUN 17, creatinine 0.47.  Glucose 96.  AST 60, ALT 46, total bilirubin 1.0.  Ammonia level 17.  Lactic acid 2.2.  Acetaminophen level less than 10.  Salicylate level less than 7.0.  COVID-19 PCR negative.  Influenza A/B PCR negative.  Urinalysis unrevealing.  EtOH level 524.  TSH 0.608.  CT head without contrast with no acute intracranial abnormality.  CT C-spine without contrast with no acute fracture/traumatic subluxation of the cervical spine, moderate degenerative changes noted.  Chest x-ray with no active cardiopulmonary disease process.  Patient received 2 L IV fluid bolus, folate, thiamine, DuoNeb treatment, cefepime, Ativan.  Then received flumazenil due to worsening mentation after receiving the Ativan for a CT scan with  improvement.  PCCM was consulted and given improvement after flumazenil and believes he does not need to be in the ICU at this time and would be appropriate for the stepdown unit for now.  TRH consulted for further evaluation and management of acute metabolic encephalopathy secondary to EtOH abuse with underlying respiratory depression/hypoxia due to poor mental status/Ativan use.  Assessment & Plan:   Acute respiratory failure with hypoxia/hypercapnia While in the ED, patient was hypoxic requiring 6-8 L nasal cannula maintain saturations.  Chest x-ray with no acute cardiopulmonary disease process.  ABG notable for hypercarbia and patient was started on BiPAP and alertness improved after receiving flumazenil as well to reverse previously given Ativan.  Underlying etiology likely secondary to his severe intoxication.  No infectious etiology found at this time. --Continue supplemental oxygen, maintain SPO2 greater than 88% (Hx COPD) --Incentive spirometry -- Attempt to wean off oxygen today  Acute metabolic encephalopathy Severe EtOH use disorder with intoxication Concern for impending EtOH withdrawal Patient found at home down by mother in which she was minimally responsive on EMS arrival.  EtOH level 524 on admission.  TSH within normal limits, no infectious etiology found. --EtoH 524>199, now <10 --CIWA protocol with symptom triggered Ativan --Thiamine, folate, multivitamin --Supportive care, remain in stepdown unit for concern of severe and impending withdrawal symptoms  Hypomagnesemia Hypophosphatemia Repleted during hospitalization  Hx COPD Not oxygen dependent at baseline --Continue supplemental oxygen as above, maintain SPO2 >88% --DuoNebs as needed  Hx Essential hypertension Currently not on  antihypertensives outpatient.  Patient was noted to be slightly hypotensive on admission, likely secondary to volume depletion and which responded well to IV fluid hydration. --Monitor BP  closely  Lactic acidosis Lactic acid 2.2 on admission, likely secondary to volume depletion/dehydration from severe EtOH abuse/intoxication.  No infectious etiology noted on work-up.  Received IV fluid hydration on admission. --Repeat lactic acid in the a.m.  Type 2 diabetes mellitus Patient reportedly on metformin 1000 g p.o. twice daily at home.  Hemoglobin A1c 12.0 on 12/09/2020.  Repeat hemoglobin A1c 8.2. --Hold oral hypoglycemics while inpatient --SSI for coverage --CBGs qAC/HS  Nicotine use disorder Patient continues to endorse 1 pack/day.  Counseled on need for complete cessation. --Nicotine patch  Weakness/debility/deconditioning: Patient was seen by PT, was max assistance and unable to transfer himself. --OT consultation -- Will need SNF placement, TOC consulted   DVT prophylaxis: enoxaparin (LOVENOX) injection 40 mg Start: 07/12/22 1000    Code Status: Full Code Family Communication: No family present at bedside this morning  Disposition Plan:  Level of care: Progressive Status is: Inpatient Remains inpatient appropriate because: Medically stable for SNF placement once bed available      Consultants:  Case was discussed with PCCM on admission by admitting physician  Procedures:  None  Antimicrobials:  Cefepime 8/29 x 1 dose   Subjective: Patient seen examined bedside, resting comfortably.  Eating breakfast.  Wishes to go home.  Was seen by PT with initially no recommendations but he remains max assistance and unable to transfer himself.  Mother reports unable to take care of him at home at this state.  Reevaluated by PT with recommendations of SNF placement now.  Medically stable for discharge once bed available.  Patient with no other questions or concerns at this time, states is amenable for rehab following hospitalization.  Denies headache, no dizziness, no chest pain, no shortness of breath, no abdominal pain.  No other acute events overnight per nursing  staff.  Objective: Vitals:   07/14/22 1829 07/14/22 2002 07/15/22 0415 07/15/22 1019  BP: 116/78 100/72 109/67 112/71  Pulse: 87 100 97 (!) 110  Resp: 16 20 20    Temp: 98.4 F (36.9 C) 98 F (36.7 C) 99.8 F (37.7 C)   TempSrc: Oral Oral Oral   SpO2: 93% 93% 91%   Weight:      Height:        Intake/Output Summary (Last 24 hours) at 07/15/2022 1238 Last data filed at 07/15/2022 1122 Gross per 24 hour  Intake 3 ml  Output 250 ml  Net -247 ml   Filed Weights   07/12/22 0100 07/13/22 0500  Weight: 82.5 kg 83 kg    Examination:  Physical Exam: GEN: NAD, alert and oriented x3, chronically ill/cachectic in appearance, appears older than stated age HEENT: NCAT, PERRL, EOMI, sclera clear, dry mucous membranes, poor dentition PULM: CTAB w/o wheezes/crackles, normal respiratory effort, on room air CV: RRR w/o M/G/R GI: abd soft, NTND, NABS, no R/G/M MSK: no peripheral edema, moves all extremities independently NEURO: CN II-XII intact, no focal deficits, sensation to light touch intact PSYCH: normal mood/affect Integumentary: dry/intact, no rashes or wounds    Data Reviewed: I have personally reviewed following labs and imaging studies  CBC: Recent Labs  Lab 07/11/22 1615 07/12/22 0258 07/13/22 0306  WBC 3.7* 5.3 6.0  NEUTROABS 2.6  --   --   HGB 12.0* 10.4* 9.2*  HCT 38.1* 33.2* 29.5*  MCV 84.5 84.9 84.8  PLT 268 223 179  Basic Metabolic Panel: Recent Labs  Lab 07/11/22 1615 07/11/22 1634 07/12/22 0258 07/13/22 0306 07/14/22 0447  NA 138  --  142 133* 132*  K 4.2  --  3.9 3.8 3.3*  CL 95*  --  101 94* 88*  CO2 29  --  28 32 34*  GLUCOSE 96  --  104* 180* 142*  BUN 17  --  16 12 6*  CREATININE 0.47*  --  0.42* 0.42* 0.33*  CALCIUM 7.9*  --  7.8* 8.2* 8.4*  MG  --  1.8  --  1.3* 1.3*  PHOS  --   --  3.2 2.1* 3.6   GFR: Estimated Creatinine Clearance: 103.7 mL/min (A) (by C-G formula based on SCr of 0.33 mg/dL (L)). Liver Function Tests: Recent Labs   Lab 07/11/22 1615 07/12/22 0258  AST 60* 43*  ALT 46* 37  ALKPHOS 120 97  BILITOT 1.0 0.8  PROT 7.1 6.1*  ALBUMIN 4.0 3.4*   Recent Labs  Lab 07/11/22 1615  LIPASE 19   Recent Labs  Lab 07/11/22 1617  AMMONIA 17   Coagulation Profile: Recent Labs  Lab 07/11/22 1615  INR 1.0   Cardiac Enzymes: No results for input(s): "CKTOTAL", "CKMB", "CKMBINDEX", "TROPONINI" in the last 168 hours. BNP (last 3 results) No results for input(s): "PROBNP" in the last 8760 hours. HbA1C: Recent Labs    07/13/22 0306  HGBA1C 8.2*   CBG: Recent Labs  Lab 07/14/22 2000 07/15/22 0000 07/15/22 0409 07/15/22 0816 07/15/22 1145  GLUCAP 206* 155* 182* 147* 332*   Lipid Profile: No results for input(s): "CHOL", "HDL", "LDLCALC", "TRIG", "CHOLHDL", "LDLDIRECT" in the last 72 hours. Thyroid Function Tests: No results for input(s): "TSH", "T4TOTAL", "FREET4", "T3FREE", "THYROIDAB" in the last 72 hours.  Anemia Panel: No results for input(s): "VITAMINB12", "FOLATE", "FERRITIN", "TIBC", "IRON", "RETICCTPCT" in the last 72 hours. Sepsis Labs: Recent Labs  Lab 07/11/22 1615 07/11/22 2156 07/13/22 0306  LATICACIDVEN 2.2* 2.4* 0.9    Recent Results (from the past 240 hour(s))  Blood Culture (routine x 2)     Status: None (Preliminary result)   Collection Time: 07/11/22  4:15 PM   Specimen: BLOOD  Result Value Ref Range Status   Specimen Description   Final    BLOOD BLOOD RIGHT HAND Performed at [facility], [address] [place], [place] [address]    Special Requests   Final    BOTTLES DRAWN AEROBIC AND ANAEROBIC Blood Culture results may not be optimal due to an inadequate volume of blood received in culture bottles Performed at [facility], [address] [place], [place] [address]    Culture   Final    NO GROWTH 4 DAYS Performed at [facility], [address] [place], [place] [address]    Report Status PENDING  Incomplete   Resp Panel by RT-PCR (Flu A&B, Covid) Anterior Nasal Swab     Status: None   Collection Time: 07/11/22  4:16 PM   Specimen: Anterior Nasal Swab  Result Value Ref Range Status   SARS Coronavirus 2 by RT PCR NEGATIVE NEGATIVE Final    Comment: (NOTE) SARS-CoV-2 target nucleic acids are NOT DETECTED.  The SARS-CoV-2 RNA is generally detectable in upper respiratory specimens during the acute phase of infection. The lowest concentration of SARS-CoV-2 viral copies this assay can detect is 138 copies/mL. A negative result does not preclude SARS-Cov-2 infection and should not be used as the sole basis for treatment  or other patient management decisions. A negative result may occur with  improper specimen collection/handling, submission of specimen other than nasopharyngeal swab, presence of viral mutation(s) within the areas targeted by this assay, and inadequate number of viral copies(<138 copies/mL). A negative result must be combined with clinical observations, patient history, and epidemiological information. The expected result is Negative.  Fact Sheet for Patients:  BloggerCourse.com  Fact Sheet for Healthcare Providers:  SeriousBroker.it  This test is no t yet approved or cleared by the [place] FDA and  has been authorized for detection and/or diagnosis of SARS-CoV-2 by FDA under an Emergency Use Authorization (EUA). This EUA will remain  in effect (meaning this test can be used) for the duration of the COVID-19 declaration under Section 564(b)(1) of the Act, 21 U.S.C.section 360bbb-3(b)(1), unless the authorization is terminated  or revoked sooner.       Influenza A by PCR NEGATIVE NEGATIVE Final   Influenza B by PCR NEGATIVE NEGATIVE Final    Comment: (NOTE) The Xpert Xpress SARS-CoV-2/FLU/RSV plus assay is intended as an aid in the diagnosis of influenza from Nasopharyngeal swab specimens and should not be used as a  sole basis for treatment. Nasal washings and aspirates are unacceptable for Xpert Xpress SARS-CoV-2/FLU/RSV testing.  Fact Sheet for Patients: BloggerCourse.com  Fact Sheet for Healthcare Providers: SeriousBroker.it  This test is not yet approved or cleared by the [place] FDA and has been authorized for detection and/or diagnosis of SARS-CoV-2 by FDA under an Emergency Use Authorization (EUA). This EUA will remain in effect (meaning this test can be used) for the duration of the COVID-19 declaration under Section 564(b)(1) of the Act, 21 U.S.C. section 360bbb-3(b)(1), unless the authorization is terminated or revoked.  Performed at [facility], [address] [place], [place] [address]   Blood Culture (routine x 2)     Status: None (Preliminary result)   Collection Time: 07/11/22  4:20 PM   Specimen: BLOOD  Result Value Ref Range Status   Specimen Description   Final    BLOOD LEFT ANTECUBITAL Performed at [facility], [address], [place], [place] [address]    Special Requests   Final    BOTTLES DRAWN AEROBIC AND ANAEROBIC Blood Culture adequate volume Performed at [facility], [address], [place], [place] [address]    Culture   Final    NO GROWTH 4 DAYS Performed at [facility], [address], [place], [place] [address]    Report Status PENDING  Incomplete  Urine Culture     Status: None   Collection Time: 07/11/22  9:41 PM   Specimen: In/Out Cath Urine  Result Value Ref Range Status   Specimen Description   Final    IN/OUT CATH URINE Performed at [facility], [address], [place], [place] [address]    Special Requests   Final    NONE Performed at [facility], [address] [place], [place] [address]    Culture   Final    NO GROWTH Performed at [facility], [address],  [place], [place] [address]    Report Status 07/12/2022 FINAL  Final  MRSA Next Gen by PCR, Nasal     Status: None   Collection Time: 07/12/22  4:03 AM   Specimen: Nasal Mucosa; Nasal Swab  Result Value Ref Range Status   MRSA by PCR Next Gen NOT DETECTED NOT DETECTED Final  Comment: (NOTE) The GeneXpert MRSA Assay (FDA approved for NASAL specimens only), is one component of a comprehensive MRSA colonization surveillance program. It is not intended to diagnose MRSA infection nor to guide or monitor treatment for MRSA infections. Test performance is not FDA approved in patients less than 77 years old. Performed at [facility], [address], [place], [place] [address]          Radiology Studies: No results found.      Scheduled Meds:  Chlorhexidine Gluconate Cloth  6 each Topical Q0600   cyanocobalamin  1,000 mcg Oral Daily   enoxaparin (LOVENOX) injection  40 mg Subcutaneous Q24H   ferrous sulfate  325 mg Oral Q breakfast   folic acid  1 mg Oral Once   folic acid  1 mg Oral Daily   insulin aspart  0-9 Units Subcutaneous Q4H   metoprolol tartrate  12.5 mg Oral BID   multivitamin with minerals  1 tablet Oral Daily   nicotine  21 mg Transdermal Daily   mouth rinse  15 mL Mouth Rinse 4 times per day   sodium chloride flush  3 mL Intravenous Q12H   thiamine  100 mg Oral Daily   Or   thiamine  100 mg Intravenous Daily   Continuous Infusions:     LOS: 3 days    Time spent: 51 minutes spent on chart review, discussion with nursing staff, consultants, updating family and interview/physical exam; more than 50% of that time was spent in counseling and/or coordination of care.    Alvira Philips [place], DO [facility] Available via Epic secure chat 7am-7pm After these hours, please refer to coverage provider listed on [email or web address] 07/15/2022, 12:38 PM

## 2022-07-15 NOTE — Progress Notes (Signed)
Physical Therapy Treatment Patient Details Name: [name] MRN: [id number] DOB: 12/03/1960 Today's Date: 07/15/2022   History of Present Illness 61 yo male admitted with severe ETOH intoxication, acute respiratory failure. Hx of ETOH abuse, COPD, pancreatitis, DM    PT Comments    Pt needing more physical assistance than at PT evaluation.  He is demonstrating ataxia and tremors in standing and requires +2 for safety.  Recommend SNF at this time.   Recommendations for follow up therapy are one component of a multi-disciplinary discharge planning process, led by the attending physician.  Recommendations may be updated based on patient status, additional functional criteria and insurance authorization.  Follow Up Recommendations  Skilled nursing-short term rehab (<3 hours/day) Can patient physically be transported by private vehicle: No   Assistance Recommended at Discharge Frequent or constant Supervision/Assistance  Patient can return home with the following Two people to help with walking and/or transfers   Equipment Recommendations  None recommended by PT    Recommendations for Other Services       Precautions / Restrictions Precautions Precautions: Fall Precaution Comments: monitor HR and O2 Restrictions Weight Bearing Restrictions: No     Mobility  Bed Mobility Overal bed mobility: Needs Assistance Bed Mobility: Supine to Sit     Supine to sit: Min assist, +2 for safety/equipment     General bed mobility comments: MIN of 1 plus MIN/guard of 2nd person    Transfers Overall transfer level: Needs assistance Equipment used: Rolling walker (2 wheels) Transfers: Sit to/from Stand, Bed to chair/wheelchair/BSC Sit to Stand: Max assist, +2 physical assistance, +2 safety/equipment   Step pivot transfers: Max assist, +2 physical assistance, +2 safety/equipment       General transfer comment: Posterior lean. Cues to shoulders forward and to push down into RW.   Pt incontinent of stool and stood while being cleaned.  the longer he stood the more tremors were present.  Stood 3x with each standing episode being shorter and shorter.  HHA of 2 for bed > recliner with blocking of feet to not slide forward.    Ambulation/Gait                   Stairs             Wheelchair Mobility    Modified Rankin (Stroke Patients Only)       Balance                                            Cognition Arousal/Alertness: Awake/alert Behavior During Therapy: Flat affect Overall Cognitive Status: Impaired/Different from baseline Area of Impairment: Problem solving, Safety/judgement                         Safety/Judgement: Decreased awareness of safety, Decreased awareness of deficits   Problem Solving: Slow processing, Difficulty sequencing, Decreased initiation, Requires verbal cues, Requires tactile cues          Exercises      General Comments        Pertinent Vitals/Pain Pain Assessment Pain Assessment: No/denies pain    Home Living Family/patient expects to be discharged to:: Private residence Living Arrangements: Alone   Type of Home: House Home Access: Stairs to enter   [employer] of Steps: "very few" Alternate Level Stairs-Number of Steps: "sometimes have to go up there" flight  Home Layout: Two level;Bed/bath upstairs Home Equipment: None      Prior Function            PT Goals (current goals can now be found in the care plan section) Acute Rehab PT Goals Patient Stated Goal: none stated PT Goal Formulation: With patient Time For Goal Achievement: 07/26/22 Potential to Achieve Goals: Good Progress towards PT goals: Not progressing toward goals - comment (tremors, ataxia, weakness)    Frequency    Min 3X/week      PT Plan Discharge plan needs to be updated    Co-evaluation PT/OT/SLP Co-Evaluation/Treatment: Yes Reason for Co-Treatment: Complexity of the  patient's impairments (multi-system involvement);For patient/therapist safety;To address functional/ADL transfers PT goals addressed during session: Mobility/safety with mobility;Proper use of DME        AM-PAC PT "6 Clicks" Mobility   Outcome Measure  Help needed turning from your back to your side while in a flat bed without using bedrails?: A Little Help needed moving from lying on your back to sitting on the side of a flat bed without using bedrails?: A Little Help needed moving to and from a bed to a chair (including a wheelchair)?: Total Help needed standing up from a chair using your arms (e.g., wheelchair or bedside chair)?: Total Help needed to walk in hospital room?: Total Help needed climbing 3-5 steps with a railing? : Total 6 Click Score: 10    End of Session   Activity Tolerance: Other (comment) (tremors and ataxia) Patient left: in chair;with call bell/phone within reach;with chair alarm set;with nursing/sitter in room Nurse Communication: Mobility status PT Visit Diagnosis: Unsteadiness on feet (R26.81);Ataxic gait (R26.0);Difficulty in walking, not elsewhere classified (R26.2)     Time: 6606-3016 PT Time Calculation (min) (ACUTE ONLY): 19 min  Charges:  $Therapeutic Activity: 8-22 mins                     Raevyn Sokol L. Katrinka Blazing, PT  07/15/2022    Enzo Montgomery 07/15/2022, 12:34 PM

## 2022-07-15 NOTE — Evaluation (Signed)
Occupational Therapy Evaluation Patient Details Name: [name] MRN: [id number] DOB: [birth year] Today's Date: 07/15/2022   History of Present Illness Patient is a 61 yo male admitted with severe ETOH intoxication, acute respiratory failure. Hx of ETOH abuse, COPD, pancreatitis, DM   Clinical Impression   Patient is a 61 year old male who was admitted for above. Patient reported living at home alone with support from mother.  Patient was noted to require max A for standing balance and hygiene tasks. Patient was unable to engage in hygiene tasks seated with lateral leaning. Patient was noted to have decreased functional activity tolerance, decreased endurance, decreased standing balance, decreased safety awareness, and decreased knowledge of AD/AE impacting participation in ADLs. Patient would continue to benefit from skilled OT services at this time while admitted and after d/c to address noted deficits in order to improve overall safety and independence in ADLs.          Recommendations for follow up therapy are one component of a multi-disciplinary discharge planning process, led by the attending physician.  Recommendations may be updated based on patient status, additional functional criteria and insurance authorization.   Follow Up Recommendations  Skilled nursing-short term rehab (<3 hours/day)    Assistance Recommended at Discharge Frequent or constant Supervision/Assistance  Patient can return home with the following Two people to help with walking and/or transfers;A lot of help with bathing/dressing/bathroom;Assistance with cooking/housework;Direct supervision/assist for financial management;Assist for transportation;Help with stairs or ramp for entrance;Direct supervision/assist for medications management    Functional Status Assessment  Patient has had a recent decline in their functional status and demonstrates the ability to make significant improvements in function in a  reasonable and predictable amount of time.  Equipment Recommendations  Other (comment) (TBD)    Recommendations for Other Services       Precautions / Restrictions Precautions Precautions: Fall Precaution Comments: monitor HR and O2 Restrictions Weight Bearing Restrictions: No      Mobility Bed Mobility Overal bed mobility: Needs Assistance Bed Mobility: Supine to Sit     Supine to sit: Min assist, +2 for safety/equipment Sit to supine: Supervision, HOB elevated   General bed mobility comments: MIN of 1 plus MIN/guard of 2nd person    Transfers                          Balance Overall balance assessment: Needs assistance Sitting-balance support: Feet supported Sitting balance-Leahy Scale: Good     Standing balance support: Reliant on assistive device for balance, Bilateral upper extremity supported Standing balance-Leahy Scale: Zero                             ADL either performed or assessed with clinical judgement   ADL Overall ADL's : Needs assistance/impaired Eating/Feeding: Minimal assistance;Sitting Eating/Feeding Details (indicate cue type and reason): tremors in BUE   Grooming Details (indicate cue type and reason): declined stating he completed this AM Upper Body Bathing: Minimal assistance;Bed level   Lower Body Bathing: Maximal assistance;Bed level   Upper Body Dressing : Minimal assistance;Sitting   Lower Body Dressing: Maximal assistance;Sit to/from stand   Toilet Transfer: Maximal assistance;+2 for physical assistance;+2 for safety/equipment;Rolling walker (2 wheels) Toilet Transfer Details (indicate cue type and reason): to turn from EOB to recliner with inreased posterior leaning noted. blocked BLE to prevent sliding with standing. Toileting- Clothing Manipulation and Hygiene: Maximal assistance;Bed level Toileting - Clothing  Manipulation Details (indicate cue type and reason): stong posterior lean in standing with TD to  stand and second to complete hygiene tasks noted to have sontinued incontinence during session             Vision Patient Visual Report: No change from baseline       Perception     Praxis      Pertinent Vitals/Pain Pain Assessment Pain Assessment: No/denies pain     Hand Dominance     Extremity/Trunk Assessment Upper Extremity Assessment Upper Extremity Assessment: Overall WFL for tasks assessed   Lower Extremity Assessment Lower Extremity Assessment: Defer to PT evaluation   Cervical / Trunk Assessment Cervical / Trunk Assessment: Normal   Communication Communication Communication: No difficulties   Cognition Arousal/Alertness: Awake/alert Behavior During Therapy: Flat affect Overall Cognitive Status: Impaired/Different from baseline Area of Impairment: Problem solving, Safety/judgement                         Safety/Judgement: Decreased awareness of safety, Decreased awareness of deficits   Problem Solving: Slow processing, Difficulty sequencing, Decreased initiation, Requires verbal cues, Requires tactile cues General Comments: easily awoke slow to respond with increased time needed to complete tasks.     General Comments       Exercises     Shoulder Instructions      Home Living Family/patient expects to be discharged to:: Private residence Living Arrangements: Alone   Type of Home: House Home Access: Stairs to enter [employer] of Steps: "very few"   Home Layout: Two level;Bed/bath upstairs Alternate Level Stairs-Number of Steps: "sometimes have to go up there" flight             Home Equipment: None          Prior Functioning/Environment Prior Level of Function : Independent/Modified Independent                        OT Problem List: Decreased activity tolerance;Impaired balance (sitting and/or standing);Decreased safety awareness;Decreased knowledge of precautions;Decreased knowledge of use of DME  or AE      OT Treatment/Interventions: Self-care/ADL training;Therapeutic exercise;Neuromuscular education;Energy conservation;DME and/or AE instruction;Therapeutic activities;Balance training;Patient/family education    OT Goals(Current goals can be found in the care plan section) Acute Rehab OT Goals Patient Stated Goal: none stated OT Goal Formulation: Patient unable to participate in goal setting Time For Goal Achievement: 07/29/22 Potential to Achieve Goals: Fair  OT Frequency: Min 2X/week    Co-evaluation PT/OT/SLP Co-Evaluation/Treatment: Yes Reason for Co-Treatment: For patient/therapist safety;To address functional/ADL transfers;Complexity of the patient's impairments (multi-system involvement) PT goals addressed during session: Mobility/safety with mobility OT goals addressed during session: ADL's and self-care      AM-PAC OT "6 Clicks" Daily Activity     Outcome Measure Help from another person eating meals?: A Little Help from another person taking care of personal grooming?: A Little Help from another person toileting, which includes using toliet, bedpan, or urinal?: A Lot Help from another person bathing (including washing, rinsing, drying)?: A Lot Help from another person to put on and taking off regular upper body clothing?: A Little Help from another person to put on and taking off regular lower body clothing?: A Lot 6 Click Score: 15   End of Session Equipment Utilized During Treatment: Gait belt;Rolling walker (2 wheels) Nurse Communication: Mobility status  Activity Tolerance: Patient tolerated treatment well Patient left: in chair;with call bell/phone within reach;with chair  alarm set;with nursing/sitter in room  OT Visit Diagnosis: Unsteadiness on feet (R26.81);Other abnormalities of gait and mobility (R26.89);Muscle weakness (generalized) (M62.81)                Time: 6153-7943 OT Time Calculation (min): 20 min Charges:  OT General Charges $OT Visit: 1  Visit OT Evaluation $OT Eval Moderate Complexity: 1 Mod  Ashlon Lottman OTR/L, MS Acute Rehabilitation Department Office# [phone]   [name] 07/15/2022, 1:02 PM

## 2022-07-15 NOTE — Plan of Care (Signed)
  Problem: Education: Goal: Knowledge of General Education information will improve Description Including pain rating scale, medication(s)/side effects and non-pharmacologic comfort measures Outcome: Progressing   Problem: Health Behavior/Discharge Planning: Goal: Ability to manage health-related needs will improve Outcome: Progressing   

## 2022-07-16 LAB — CULTURE, BLOOD (ROUTINE X 2)
Culture: NO GROWTH
Culture: NO GROWTH
Special Requests: ADEQUATE

## 2022-07-16 LAB — BASIC METABOLIC PANEL
Anion gap: 6 (ref 5–15)
BUN: 18 mg/dL (ref 8–23)
CO2: 31 mmol/L (ref 22–32)
Calcium: 8.7 mg/dL — ABNORMAL LOW (ref 8.9–10.3)
Chloride: 98 mmol/L (ref 98–111)
Creatinine, Ser: 0.43 mg/dL — ABNORMAL LOW (ref 0.61–1.24)
GFR, Estimated: >60 mL/min (ref >60)
Glucose, Bld: 158 mg/dL — ABNORMAL HIGH (ref 70–99)
Potassium: 3.8 mmol/L (ref 3.5–5.1)
Sodium: 135 mmol/L (ref 135–145)

## 2022-07-16 LAB — GLUCOSE, CAPILLARY
Glucose-Capillary: 224 mg/dL — ABNORMAL HIGH (ref 70–99)
Glucose-Capillary: 134 mg/dL — ABNORMAL HIGH (ref 70–99)
Glucose-Capillary: 152 mg/dL — ABNORMAL HIGH (ref 70–99)
Glucose-Capillary: 203 mg/dL — ABNORMAL HIGH (ref 70–99)
Glucose-Capillary: 200 mg/dL — ABNORMAL HIGH (ref 70–99)

## 2022-07-16 LAB — PHOSPHORUS: Phosphorus: 4.1 mg/dL (ref 2.5–4.6)

## 2022-07-16 LAB — MAGNESIUM: Magnesium: 1.5 mg/dL — ABNORMAL LOW (ref 1.7–2.4)

## 2022-07-16 MED ORDER — MAGNESIUM SULFATE 4 GM/100ML IV SOLN
4.0000 g | Freq: Once | INTRAVENOUS | Status: AC
  Administered 2022-07-16: 4 g via INTRAVENOUS
  Filled 2022-07-16: qty 100

## 2022-07-16 MED ORDER — INSULIN ASPART 100 UNIT/ML IJ SOLN
0.0000 [IU] | Freq: Three times a day (TID) | INTRAMUSCULAR | Status: DC
  Administered 2022-07-16: 3 [IU] via SUBCUTANEOUS
  Administered 2022-07-16: 2 [IU] via SUBCUTANEOUS
  Administered 2022-07-16: 1 [IU] via SUBCUTANEOUS
  Administered 2022-07-17: 0.2 [IU] via SUBCUTANEOUS
  Administered 2022-07-17 (×2): 2 [IU] via SUBCUTANEOUS

## 2022-07-16 MED ORDER — POTASSIUM CHLORIDE CRYS ER 20 MEQ PO TBCR
20.0000 meq | EXTENDED_RELEASE_TABLET | Freq: Once | ORAL | Status: AC
  Administered 2022-07-16: 20 meq via ORAL
  Filled 2022-07-16: qty 1

## 2022-07-16 NOTE — Progress Notes (Signed)
Patient continues not to wear BIPAP at this time;a unit is available when needed. Pt continues to be on  room air and O2 is available when needed.

## 2022-07-16 NOTE — Progress Notes (Signed)
PROGRESS NOTE    [name]  [id number] DOB: August 27, 1961 DOA: 07/11/2022 PCP: Ladell Pier, MD    Brief Narrative:   [name] is a 61 y.o. male with past medical history significant for COPD, HTN, type 2 diabetes mellitus, heavy alcohol abuse, history of pancreatitis who presented to [facility] ED on 8/29 via EMS for confusion, altered mental status.  Patient was found by mother who took him to the store yesterday and reported that he was in normal state of health.  Upon EMS arrival patient was responding only to pain with empty beer cans laying around his home.  Mother reports that he is a chronic alcohol user and will drink heavily and whenever he can get his hands on any type of alcohol.  No history of toxic alcohol use.  Mother reports he has had some general decline over the past month with a gradual increasing weakness.  Mother reports does not recall him having alcohol withdrawal symptoms; but does not remember him actually ever stopping drinking for a significant period of time.  In the ED, temperature 97.7 F, HR 84, RR 11, BP 89/60, SPO2 89% on room air.  WBC 3.7, hemoglobin 12.0, platelets 268.  Sodium 138, potassium 4.2, chloride 95, CO2 29, BUN 17, creatinine 0.47.  Glucose 96.  AST 60, ALT 46, total bilirubin 1.0.  Ammonia level 17.  Lactic acid 2.2.  Acetaminophen level less than 10.  Salicylate level less than 7.0.  COVID-19 PCR negative.  Influenza A/B PCR negative.  Urinalysis unrevealing.  EtOH level 524.  TSH 0.608.  CT head without contrast with no acute intracranial abnormality.  CT C-spine without contrast with no acute fracture/traumatic subluxation of the cervical spine, moderate degenerative changes noted.  Chest x-ray with no active cardiopulmonary disease process.  Patient received 2 L IV fluid bolus, folate, thiamine, DuoNeb treatment, cefepime, Ativan.  Then received flumazenil due to worsening mentation after receiving the Ativan for a CT scan with  improvement.  PCCM was consulted and given improvement after flumazenil and believes he does not need to be in the ICU at this time and would be appropriate for the stepdown unit for now.  TRH consulted for further evaluation and management of acute metabolic encephalopathy secondary to EtOH abuse with underlying respiratory depression/hypoxia due to poor mental status/Ativan use.  Assessment & Plan:   Acute respiratory failure with hypoxia/hypercapnia: Resolved While in the ED, patient was hypoxic requiring 6-8 L nasal cannula maintain saturations.  Chest x-ray with no acute cardiopulmonary disease process.  ABG notable for hypercarbia and patient was started on BiPAP and alertness improved after receiving flumazenil as well to reverse previously given Ativan.  Underlying etiology likely secondary to his severe intoxication.  No infectious etiology found at this time.  Now weaned off of supplemental oxygen. --Incentive spirometry  Acute metabolic encephalopathy Severe EtOH use disorder with intoxication EtOH withdrawal: Resolved Patient found at home down by mother in which she was minimally responsive on EMS arrival.  EtOH level 524 on admission.  TSH within normal limits, no infectious etiology found.  During initial hospitalization, patient did have withdrawal symptoms that were supported with symptom triggered Ativan and now have resolved. --EtoH 524>199, now <10 --Thiamine, folate, multivitamin --Supportive care  Hypomagnesemia Hypophosphatemia Potassium 3.8, magnesium 1.5, phosphorus 4.1, will replete potassium and magnesium. -- Repeat electrolytes in a.m.  Hx COPD Not oxygen dependent at baseline --Continue supplemental oxygen as above, maintain SPO2 >88% --DuoNebs as needed  Hx Essential  hypertension Currently not on antihypertensives outpatient.  Patient was noted to be slightly hypotensive on admission, likely secondary to volume depletion and which responded well to IV fluid  hydration. --Monitor BP closely  Lactic acidosis: Resolved Lactic acid 2.2 on admission, likely secondary to volume depletion/dehydration from severe EtOH abuse/intoxication.  No infectious etiology noted on work-up.  Received IV fluid hydration on admission with repeat lactic acid 0.9.   Type 2 diabetes mellitus Patient reportedly on metformin 1000 mg p.o. twice daily at home.  Hemoglobin A1c 12.0 on 12/09/2020.  Repeat hemoglobin A1c 8.2. --restart home metformin 1000mg  PO BID today --SSI for coverage --CBGs qAC/HS  Nicotine use disorder Patient continues to endorse 1 pack/day.  Counseled on need for complete cessation. --Nicotine patch  Weakness/debility/deconditioning: Seen by PT and OT with recommendations of SNF placement. --TOC for placement    DVT prophylaxis: enoxaparin (LOVENOX) injection 40 mg Start: 07/12/22 1000    Code Status: Full Code Family Communication: No family present at bedside this morning  Disposition Plan:  Level of care: Med-Surg Status is: Inpatient Remains inpatient appropriate because: Medically stable for SNF placement once bed available      Consultants:  Case was discussed with PCCM on admission by admitting physician  Procedures:  None  Antimicrobials:  Cefepime 8/29 x 1 dose   Subjective: Patient seen examined bedside, resting comfortably.  Lying in bed.  No complaints this morning.  RN present.  Awaiting SNF placement.  Medically stable for discharge once bed available.  No other specific questions or concerns at this time. Denies headache, no dizziness, no chest pain, no shortness of breath, no abdominal pain.  No other acute events overnight per nursing staff.  Objective: Vitals:   07/15/22 1019 07/15/22 1548 07/15/22 2114 07/16/22 0424  BP: 112/71 117/73 122/85 132/88  Pulse: (!) 110 95 100 100  Resp:  18 (!) 24 19  Temp:  98.1 F (36.7 C) 98.1 F (36.7 C) 98.3 F (36.8 C)  TempSrc:  Oral Oral Oral  SpO2:  94% 95% 93%   Weight:      Height:        Intake/Output Summary (Last 24 hours) at 07/16/2022 1101 Last data filed at 07/16/2022 0900 Gross per 24 hour  Intake 120 ml  Output 800 ml  Net -680 ml   Filed Weights   07/12/22 0100 07/13/22 0500  Weight: 82.5 kg 83 kg    Examination:  Physical Exam: GEN: NAD, alert and oriented x3, chronically ill/cachectic in appearance, appears older than stated age HEENT: NCAT, PERRL, EOMI, sclera clear, dry mucous membranes, poor dentition PULM: CTAB w/o wheezes/crackles, normal respiratory effort, on room air CV: RRR w/o M/G/R GI: abd soft, NTND, NABS, no R/G/M MSK: no peripheral edema, moves all extremities independently NEURO: CN II-XII intact, no focal deficits, sensation to light touch intact PSYCH: normal mood/affect Integumentary: dry/intact, no rashes or wounds    Data Reviewed: I have personally reviewed following labs and imaging studies  CBC: Recent Labs  Lab 07/11/22 1615 07/12/22 0258 07/13/22 0306  WBC 3.7* 5.3 6.0  NEUTROABS 2.6  --   --   HGB 12.0* 10.4* 9.2*  HCT 38.1* 33.2* 29.5*  MCV 84.5 84.9 84.8  PLT 268 223 179   Basic Metabolic Panel: Recent Labs  Lab 07/11/22 1615 07/11/22 1634 07/12/22 0258 07/13/22 0306 07/14/22 0447 07/16/22 0359  NA 138  --  142 133* 132* 135  K 4.2  --  3.9 3.8 3.3* 3.8  CL 95*  --  101 94* 88* 98  CO2 29  --  28 32 34* 31  GLUCOSE 96  --  104* 180* 142* 158*  BUN 17  --  16 12 6* 18  CREATININE 0.47*  --  0.42* 0.42* 0.33* 0.43*  CALCIUM 7.9*  --  7.8* 8.2* 8.4* 8.7*  MG  --  1.8  --  1.3* 1.3* 1.5*  PHOS  --   --  3.2 2.1* 3.6 4.1   GFR: Estimated Creatinine Clearance: 103.7 mL/min (A) (by C-G formula based on SCr of 0.43 mg/dL (L)). Liver Function Tests: Recent Labs  Lab 07/11/22 1615 07/12/22 0258  AST 60* 43*  ALT 46* 37  ALKPHOS 120 97  BILITOT 1.0 0.8  PROT 7.1 6.1*  ALBUMIN 4.0 3.4*   Recent Labs  Lab 07/11/22 1615  LIPASE 19   Recent Labs  Lab 07/11/22 1617   AMMONIA 17   Coagulation Profile: Recent Labs  Lab 07/11/22 1615  INR 1.0   Cardiac Enzymes: No results for input(s): "CKTOTAL", "CKMB", "CKMBINDEX", "TROPONINI" in the last 168 hours. BNP (last 3 results) No results for input(s): "PROBNP" in the last 8760 hours. HbA1C: No results for input(s): "HGBA1C" in the last 72 hours.  CBG: Recent Labs  Lab 07/15/22 1641 07/15/22 2026 07/15/22 2346 07/16/22 0419 07/16/22 0814  GLUCAP 285* 216* 188* 224* 134*   Lipid Profile: No results for input(s): "CHOL", "HDL", "LDLCALC", "TRIG", "CHOLHDL", "LDLDIRECT" in the last 72 hours. Thyroid Function Tests: No results for input(s): "TSH", "T4TOTAL", "FREET4", "T3FREE", "THYROIDAB" in the last 72 hours.  Anemia Panel: No results for input(s): "VITAMINB12", "FOLATE", "FERRITIN", "TIBC", "IRON", "RETICCTPCT" in the last 72 hours. Sepsis Labs: Recent Labs  Lab 07/11/22 1615 07/11/22 2156 07/13/22 0306  LATICACIDVEN 2.2* 2.4* 0.9    Recent Results (from the past 240 hour(s))  Blood Culture (routine x 2)     Status: None (Preliminary result)   Collection Time: 07/11/22  4:15 PM   Specimen: BLOOD  Result Value Ref Range Status   Specimen Description   Final    BLOOD BLOOD RIGHT HAND Performed at [facility], [place] [address] [place], [place] [address]    Special Requests   Final    BOTTLES DRAWN AEROBIC AND ANAEROBIC Blood Culture results may not be optimal due to an inadequate volume of blood received in culture bottles Performed at [place] [address] [place], [place] [address]    Culture   Final    NO GROWTH 4 DAYS Performed at [facility], [place] [address], [place], [place] [address]    Report Status PENDING  Incomplete  Resp Panel by RT-PCR (Flu A&B, Covid) Anterior Nasal Swab     Status: None   Collection Time: 07/11/22  4:16 PM   Specimen: Anterior Nasal Swab  Result Value Ref Range Status   SARS Coronavirus 2 by RT  PCR NEGATIVE NEGATIVE Final    Comment: (NOTE) SARS-CoV-2 target nucleic acids are NOT DETECTED.  The SARS-CoV-2 RNA is generally detectable in upper respiratory specimens during the acute phase of infection. The lowest concentration of SARS-CoV-2 viral copies this assay can detect is 138 copies/mL. A negative result does not preclude SARS-Cov-2 infection and should not be used as the sole basis for treatment or other patient management decisions. A negative result may occur with  improper specimen collection/handling, submission of specimen other than nasopharyngeal swab, presence of viral mutation(s) within the areas targeted by this assay, and  inadequate number of viral copies(<138 copies/mL). A negative result must be combined with clinical observations, patient history, and epidemiological information. The expected result is Negative.  Fact Sheet for Patients:  EntrepreneurPulse.com.au  Fact Sheet for Healthcare Providers:  IncredibleEmployment.be  This test is no t yet approved or cleared by the [place] FDA and  has been authorized for detection and/or diagnosis of SARS-CoV-2 by FDA under an Emergency Use Authorization (EUA). This EUA will remain  in effect (meaning this test can be used) for the duration of the COVID-19 declaration under Section 564(b)(1) of the Act, 21 U.S.C.section 360bbb-3(b)(1), unless the authorization is terminated  or revoked sooner.       Influenza A by PCR NEGATIVE NEGATIVE Final   Influenza B by PCR NEGATIVE NEGATIVE Final    Comment: (NOTE) The Xpert Xpress SARS-CoV-2/FLU/RSV plus assay is intended as an aid in the diagnosis of influenza from Nasopharyngeal swab specimens and should not be used as a sole basis for treatment. Nasal washings and aspirates are unacceptable for Xpert Xpress SARS-CoV-2/FLU/RSV testing.  Fact Sheet for Patients: EntrepreneurPulse.com.au  Fact Sheet for  Healthcare Providers: IncredibleEmployment.be  This test is not yet approved or cleared by the [place] FDA and has been authorized for detection and/or diagnosis of SARS-CoV-2 by FDA under an Emergency Use Authorization (EUA). This EUA will remain in effect (meaning this test can be used) for the duration of the COVID-19 declaration under Section 564(b)(1) of the Act, 21 U.S.C. section 360bbb-3(b)(1), unless the authorization is terminated or revoked.  Performed at [facility], [place] [address], [place], [place] [address]   Blood Culture (routine x 2)     Status: None (Preliminary result)   Collection Time: 07/11/22  4:20 PM   Specimen: BLOOD  Result Value Ref Range Status   Specimen Description   Final    BLOOD LEFT ANTECUBITAL Performed at [facility] [address] [place], [place] [address]    Special Requests   Final    BOTTLES DRAWN AEROBIC AND ANAEROBIC Blood Culture adequate volume Performed at [place] [address], [place], [place] [address]    Culture   Final    NO GROWTH 4 DAYS Performed at [facility], [place] [address] [place], [place] [address]    Report Status PENDING  Incomplete  Urine Culture     Status: None   Collection Time: 07/11/22  9:41 PM   Specimen: In/Out Cath Urine  Result Value Ref Range Status   Specimen Description   Final    IN/OUT CATH URINE Performed at [place] [address] [place], [place] [address]    Special Requests   Final    NONE Performed at [facility], [place] [address] [place], [place] [address]    Culture   Final    NO GROWTH Performed at [facility], [place] [address], [place], [place] [address]    Report Status 07/12/2022 FINAL  Final  MRSA Next Gen by PCR, Nasal     Status: None   Collection Time: 07/12/22  4:03 AM   Specimen: Nasal Mucosa; Nasal Swab  Result Value Ref Range  Status   MRSA by PCR Next Gen NOT DETECTED NOT DETECTED Final    Comment: (NOTE) The GeneXpert MRSA Assay (FDA approved for NASAL specimens only), is one component of a comprehensive MRSA colonization surveillance program. It is not intended to diagnose MRSA infection nor to  guide or monitor treatment for MRSA infections. Test performance is not FDA approved in patients less than 53 years old. Performed at [facility], [address] [place], [place] [address]          Radiology Studies: No results found.      Scheduled Meds:  Chlorhexidine Gluconate Cloth  6 each Topical Q0600   cyanocobalamin  1,000 mcg Oral Daily   enoxaparin (LOVENOX) injection  40 mg Subcutaneous Q24H   ferrous sulfate  325 mg Oral Q breakfast   folic acid  1 mg Oral Daily   insulin aspart  0-9 Units Subcutaneous TID WC   metFORMIN  1,000 mg Oral BID WC   metoprolol tartrate  12.5 mg Oral BID   multivitamin with minerals  1 tablet Oral Daily   nicotine  21 mg Transdermal Daily   mouth rinse  15 mL Mouth Rinse 4 times per day   sodium chloride flush  3 mL Intravenous Q12H   thiamine  100 mg Oral Daily   Or   thiamine  100 mg Intravenous Daily   Continuous Infusions:  magnesium sulfate bolus IVPB 4 g (07/16/22 1028)      LOS: 4 days    Time spent: 49 minutes spent on chart review, discussion with nursing staff, consultants, updating family and interview/physical exam; more than 50% of that time was spent in counseling and/or coordination of care.    Alvira Philips [place], DO [facility] Available via Epic secure chat 7am-7pm After these hours, please refer to coverage provider listed on [email or web address] 07/16/2022, 11:01 AM

## 2022-07-16 NOTE — Plan of Care (Signed)
  Problem: Clinical Measurements: Goal: Ability to maintain clinical measurements within normal limits will improve Outcome: Progressing Goal: Respiratory complications will improve Outcome: Progressing Goal: Cardiovascular complication will be avoided Outcome: Progressing   

## 2022-07-16 NOTE — Plan of Care (Signed)
  Problem: Activity: Goal: Risk for activity intolerance will decrease Outcome: Progressing   Problem: Coping: Goal: Level of anxiety will decrease Outcome: Progressing   Problem: Education: Goal: Knowledge of General Education information will improve Description: Including pain rating scale, medication(s)/side effects and non-pharmacologic comfort measures Outcome: Adequate for Discharge   Problem: Clinical Measurements: Goal: Respiratory complications will improve Outcome: Adequate for Discharge Goal: Cardiovascular complication will be avoided Outcome: Adequate for Discharge   Problem: Nutrition: Goal: Adequate nutrition will be maintained Outcome: Adequate for Discharge

## 2022-07-17 LAB — BASIC METABOLIC PANEL
Anion gap: 6 (ref 5–15)
BUN: 16 mg/dL (ref 8–23)
CO2: 29 mmol/L (ref 22–32)
Calcium: 8.4 mg/dL — ABNORMAL LOW (ref 8.9–10.3)
Chloride: 99 mmol/L (ref 98–111)
Creatinine, Ser: 0.66 mg/dL (ref 0.61–1.24)
GFR, Estimated: >60 mL/min (ref >60)
Glucose, Bld: 237 mg/dL — ABNORMAL HIGH (ref 70–99)
Potassium: 3.9 mmol/L (ref 3.5–5.1)
Sodium: 134 mmol/L — ABNORMAL LOW (ref 135–145)

## 2022-07-17 LAB — GLUCOSE, CAPILLARY
Glucose-Capillary: 177 mg/dL — ABNORMAL HIGH (ref 70–99)
Glucose-Capillary: 183 mg/dL — ABNORMAL HIGH (ref 70–99)
Glucose-Capillary: 186 mg/dL — ABNORMAL HIGH (ref 70–99)
Glucose-Capillary: 268 mg/dL — ABNORMAL HIGH (ref 70–99)

## 2022-07-17 LAB — MAGNESIUM: Magnesium: 1.6 mg/dL — ABNORMAL LOW (ref 1.7–2.4)

## 2022-07-17 MED ORDER — MAGNESIUM SULFATE 4 GM/100ML IV SOLN
4.0000 g | Freq: Once | INTRAVENOUS | Status: AC
Start: 2022-07-17 — End: 2022-07-17
  Administered 2022-07-17: 4 g via INTRAVENOUS
  Filled 2022-07-17: qty 100

## 2022-07-17 MED ORDER — MAGNESIUM OXIDE -MG SUPPLEMENT 400 (240 MG) MG PO TABS: 400.0000 mg | ORAL_TABLET | Freq: Two times a day (BID) | ORAL | Status: DC

## 2022-07-17 NOTE — Progress Notes (Signed)
Physical Therapy Treatment Patient Details Name: [name] MRN: [id number] DOB: 1961-08-20 Today's Date: 07/17/2022   History of Present Illness Patient is a 61 yo male admitted with severe ETOH intoxication, acute respiratory failure. Hx of ETOH abuse, COPD, pancreatitis, DM    PT Comments    General Comments: AxO x 3 very pleasant "feeling better". Assisted OOB to amb in hallway went VERY well.  General bed mobility comments: pt self able to rise using B UE's to steady self. General transfer comment: pt self able to rise with good use of B UE's to steady self.  MUCH IMPROVED  Pt able to amb full unit with walker to start at [facility] the without walker with same assist.  Good alternating gait with slight deviation with turns but self corrected. Pt plans to return home.  No equipment needed.  "Mom has a walker if I need one", stated pt.    Recommendations for follow up therapy are one component of a multi-disciplinary discharge planning process, led by the attending physician.  Recommendations may be updated based on patient status, additional functional criteria and insurance authorization.  Follow Up Recommendations  No PT follow up Can patient physically be transported by private vehicle: Yes   Assistance Recommended at Discharge Frequent or constant Supervision/Assistance  Patient can return home with the following Assist for transportation   Equipment Recommendations  None recommended by PT    Recommendations for Other Services       Precautions / Restrictions Precautions Precautions: Fall Precaution Comments: monitor HR Restrictions Weight Bearing Restrictions: No     Mobility  Bed Mobility Overal bed mobility: Needs Assistance Bed Mobility: Supine to Sit     Supine to sit: Supervision     General bed mobility comments: pt self able to rise using B UE's to steady self.    Transfers Overall transfer level: Needs assistance Equipment used: Rolling  walker (2 wheels), None Transfers: Sit to/from Stand Sit to Stand: Supervision, Min guard           General transfer comment: pt self able to rise with good use of B UE's to steady self.  MUCH IMPROVED    Ambulation/Gait Ambulation/Gait assistance: Min guard, Min assist Gait Distance (Feet): 550 Feet Assistive device: Rolling walker (2 wheels), None Gait Pattern/deviations: Step-through pattern       General Gait Details: MUCH IMPROVED able to amb full unit with walker to start at [facility] [facility] the without walker with same assist.  Good alternating gait with slight deviation with turns but self corrected.   Stairs             Wheelchair Mobility    Modified Rankin (Stroke Patients Only)       Balance                                            Cognition Arousal/Alertness: Awake/alert Behavior During Therapy: WFL for tasks assessed/performed Overall Cognitive Status: Within Functional Limits for tasks assessed                                 General Comments: AxO x 3 very pleasant "feeling better".        Exercises      General Comments        Pertinent Vitals/Pain Pain Assessment  Pain Assessment: 0-10    Home Living                          Prior Function            PT Goals (current goals can now be found in the care plan section) Progress towards PT goals: Progressing toward goals    Frequency    Min 3X/week      PT Plan Discharge plan needs to be updated    Co-evaluation              AM-PAC PT "6 Clicks" Mobility   Outcome Measure  Help needed turning from your back to your side while in a flat bed without using bedrails?: None Help needed moving from lying on your back to sitting on the side of a flat bed without using bedrails?: None Help needed moving to and from a bed to a chair (including a wheelchair)?: None Help needed standing up from a chair using your arms (e.g.,  wheelchair or bedside chair)?: None Help needed to walk in hospital room?: A Little Help needed climbing 3-5 steps with a railing? : A Little 6 Click Score: 22    End of Session Equipment Utilized During Treatment: Gait belt Activity Tolerance: Patient tolerated treatment well Patient left: in chair;with chair alarm set;with call bell/phone within reach Nurse Communication: Mobility status PT Visit Diagnosis: Unsteadiness on feet (R26.81);Ataxic gait (R26.0);Difficulty in walking, not elsewhere classified (R26.2)     Time: 1214-1229 PT Time Calculation (min) (ACUTE ONLY): 15 min  Charges:  $Gait Training: 8-22 mins                     Felecia Shelling  PTA Acute  Rehabilitation Services Office M-F          [phone] Weekend pager [phone]

## 2022-07-17 NOTE — TOC Initial Note (Addendum)
Transition of Care [facility]) - Initial/Assessment Note    Patient Details  Name: [name] MRN: [id number] Date of Birth: 08/09/1961  [facility] CM/SW Contact:    Lavenia Atlas, RN Phone Number: 07/17/2022, 12:23 PM  Clinical Narrative:      Jayme Cloud consult for SNF placement, patient does not have medical insurance and can not afford to pay out of pocket, which may make SNF placement a challenge. This RNCM discussed possible charity home health services if a home health agency will accept, patient agreed on this plan. MD notified this RNCM that patient's mother refused to take him home.   - 12:38p Left voicemail message for patient's mother for call back.  Home health agency unable to accept this patient for charity HHPT/OT.  12:56p PT no longer recommends SNF services. Spoke with patient who reports his mother will come pick him up for discharge, he will call her.  TOC will continue to follow.              Expected Discharge Plan: Home/Self Care Barriers to Discharge: Family Issues, Financial Resources   Patient Goals and CMS Choice Patient states their goals for this hospitalization and ongoing recovery are:: requesting SNF placement CMS Medicare.gov Compare Post Acute Care list provided to:: Patient Choice offered to / list presented to : NA  Expected Discharge Plan and Services Expected Discharge Plan: Home/Self Care In-house Referral: NA Discharge Planning Services: CM Consult Post Acute Care Choice: NA Living arrangements for the past 2 months: Skilled Nursing Facility Expected Discharge Date: 07/14/22               DME Arranged: N/A DME Agency: NA       HH Arranged: NA HH Agency: NA        Prior Living Arrangements/Services Living arrangements for the past 2 months: Skilled Nursing Facility Lives with:: Parents (Mother: Darlyne Russian) Patient language and need for interpreter reviewed:: Yes Do you feel safe going back to the place  where you live?: Yes      Need for Family Participation in Patient Care: No (Comment) Care giver support system in place?: No (comment) Current home services: Other (comment) (None) Criminal Activity/Legal Involvement Pertinent to Current Situation/Hospitalization: No - Comment as needed  Activities of Daily Living Home Assistive Devices/Equipment: None ADL Screening (condition at time of admission) Patient's cognitive ability adequate to safely complete daily activities?: Yes Is the patient deaf or have difficulty hearing?: No Does the patient have difficulty seeing, even when wearing glasses/contacts?: No Does the patient have difficulty concentrating, remembering, or making decisions?: Yes Patient able to express need for assistance with ADLs?: Yes Does the patient have difficulty dressing or bathing?: No Independently performs ADLs?: Yes (appropriate for developmental age) Does the patient have difficulty walking or climbing stairs?: No Weakness of Legs: Both Weakness of Arms/Hands: Both  Permission Sought/Granted Permission sought to share information with : Case Manager Permission granted to share information with : Yes, Verbal Permission Granted  Share Information with NAME: Case Manager           Emotional Assessment Appearance:: Appears stated age Attitude/Demeanor/Rapport: Gracious Affect (typically observed): Accepting Orientation: : Oriented to Self, Oriented to Place, Oriented to  Time, Oriented to Situation Alcohol / Substance Use: Alcohol Use Psych Involvement: No (comment)  Admission diagnosis:  Hypothermia of newborn [P80.9] Hypercarbia [R06.89] Transaminitis [R74.01] Low oxygen saturation [R79.81] Alcohol abuse with intoxication (HCC) [F10.129] Altered mental status, unspecified altered mental status type [R41.82]  Acute on chronic respiratory failure with hypoxia and hypercapnia (HCC) [D92.42, J96.22] Patient Active Problem List   Diagnosis Date Noted    Very severe alcohol intoxication with complication (HCC) 07/11/2022   Acute on chronic respiratory failure with hypoxia and hypercapnia (HCC) 07/11/2022   Acute encephalopathy 07/11/2022   Type 2 diabetes mellitus with hyperglycemia (HCC) 02/07/2022   COVID-19 vaccine series completed 12/09/2020   Influenza vaccination declined 12/09/2020   Drinking binge 12/09/2020   Hypertension 11/04/2013   Blurry vision, bilateral 11/04/2013   Pancreatitis 02/09/2012   Alcohol abuse 02/09/2012   TOBACCO ABUSE 10/19/2010   COPD (chronic obstructive pulmonary disease) (HCC) 10/19/2010   PANCREATITIS, ACUTE 10/19/2010   PCP:  Marcine Matar, MD Pharmacy:   [facility] [address], [address] [place] [address] Phone: [phone] Fax: [phone]     Social Determinants of Health (SDOH) Interventions    Readmission Risk Interventions     No data to display

## 2022-07-17 NOTE — Progress Notes (Signed)
PROGRESS NOTE    [name]  JQD:[id number] DOB: 1961/02/24 DOA: 07/11/2022 PCP: Marcine Matar, MD    Brief Narrative:   [name] is a 61 y.o. male with past medical history significant for COPD, HTN, type 2 diabetes mellitus, heavy alcohol abuse, history of pancreatitis who presented to [facility] ED on 8/29 via EMS for confusion, altered mental status.  Patient was found by mother who took him to the store yesterday and reported that he was in normal state of health.  Upon EMS arrival patient was responding only to pain with empty beer cans laying around his home.  Mother reports that he is a chronic alcohol user and will drink heavily and whenever he can get his hands on any type of alcohol.  No history of toxic alcohol use.  Mother reports he has had some general decline over the past month with a gradual increasing weakness.  Mother reports does not recall him having alcohol withdrawal symptoms; but does not remember him actually ever stopping drinking for a significant period of time.  In the ED, temperature 97.7 F, HR 84, RR 11, BP 89/60, SPO2 89% on room air.  WBC 3.7, hemoglobin 12.0, platelets 268.  Sodium 138, potassium 4.2, chloride 95, CO2 29, BUN 17, creatinine 0.47.  Glucose 96.  AST 60, ALT 46, total bilirubin 1.0.  Ammonia level 17.  Lactic acid 2.2.  Acetaminophen level less than 10.  Salicylate level less than 7.0.  COVID-19 PCR negative.  Influenza A/B PCR negative.  Urinalysis unrevealing.  EtOH level 524.  TSH 0.608.  CT head without contrast with no acute intracranial abnormality.  CT C-spine without contrast with no acute fracture/traumatic subluxation of the cervical spine, moderate degenerative changes noted.  Chest x-ray with no active cardiopulmonary disease process.  Patient received 2 L IV fluid bolus, folate, thiamine, DuoNeb treatment, cefepime, Ativan.  Then received flumazenil due to worsening mentation after receiving the Ativan for a CT scan with  improvement.  PCCM was consulted and given improvement after flumazenil and believes he does not need to be in the ICU at this time and would be appropriate for the stepdown unit for now.  TRH consulted for further evaluation and management of acute metabolic encephalopathy secondary to EtOH abuse with underlying respiratory depression/hypoxia due to poor mental status/Ativan use.  Assessment & Plan:   Acute respiratory failure with hypoxia/hypercapnia: Resolved While in the ED, patient was hypoxic requiring 6-8 L nasal cannula maintain saturations.  Chest x-ray with no acute cardiopulmonary disease process.  ABG notable for hypercarbia and patient was started on BiPAP and alertness improved after receiving flumazenil as well to reverse previously given Ativan.  Underlying etiology likely secondary to his severe intoxication.  No infectious etiology found at this time.  Now weaned off of supplemental oxygen. --Incentive spirometry  Acute metabolic encephalopathy Severe EtOH use disorder with intoxication EtOH withdrawal: Resolved Patient found at home down by mother in which she was minimally responsive on EMS arrival.  EtOH level 524 on admission.  TSH within normal limits, no infectious etiology found.  During initial hospitalization, patient did have withdrawal symptoms that were supported with symptom triggered Ativan and now have resolved. --EtoH 524>199, now <10 --Thiamine, folate, multivitamin --Supportive care  Hypomagnesemia Hypophosphatemia Potassium 3.9, magnesium 1.6, phosphorus 4.1, will replete magnesium.  Hx COPD Not oxygen dependent at baseline --Continue supplemental oxygen as above, maintain SPO2 >88% --DuoNebs as needed  Hx Essential hypertension Currently not on antihypertensives outpatient.  Patient was noted to be slightly hypotensive on admission, likely secondary to volume depletion and which responded well to IV fluid hydration. --Monitor BP closely  Lactic  acidosis: Resolved Lactic acid 2.2 on admission, likely secondary to volume depletion/dehydration from severe EtOH abuse/intoxication.  No infectious etiology noted on work-up.  Received IV fluid hydration on admission with repeat lactic acid 0.9.   Type 2 diabetes mellitus Patient reportedly on metformin 1000 mg p.o. twice daily at home.  Hemoglobin A1c 12.0 on 12/09/2020.  Repeat hemoglobin A1c 8.2. --restart home metformin 1000mg  PO BID today --SSI for coverage --CBGs qAC/HS  Nicotine use disorder Patient continues to endorse 1 pack/day.  Counseled on need for complete cessation. --Nicotine patch  Weakness/debility/deconditioning: Seen by PT and OT with recommendations of SNF placement. --TOC for placement    DVT prophylaxis: enoxaparin (LOVENOX) injection 40 mg Start: 07/12/22 1000    Code Status: Full Code Family Communication: No family present at bedside this morning  Disposition Plan:  Level of care: Med-Surg Status is: Inpatient Remains inpatient appropriate because: Medically stable for SNF placement once bed available      Consultants:  Case was discussed with PCCM on admission by admitting physician  Procedures:  None  Antimicrobials:  Cefepime 8/29 x 1 dose   Subjective: Patient seen examined bedside, resting comfortably.  Lying in bed.  No complaints this morning.  RN present.  Awaiting SNF placement.  Medically stable for discharge once bed available.  No other specific questions or concerns at this time. Denies headache, no dizziness, no chest pain, no shortness of breath, no abdominal pain.  No other acute events overnight per nursing staff.  Objective: Vitals:   07/16/22 1323 07/16/22 2049 07/17/22 0510 07/17/22 0843  BP: (!) 141/90 (!) 140/93 127/80   Pulse: (!) 103 100 82   Resp: 20 19 19    Temp: 98.5 F (36.9 C) 99.3 F (37.4 C) 98.9 F (37.2 C)   TempSrc: Oral Oral Oral   SpO2: 95% 95% 92%   Weight:    55.6 kg  Height:         Intake/Output Summary (Last 24 hours) at 07/17/2022 1055 Last data filed at 07/17/2022 0000 Gross per 24 hour  Intake 120 ml  Output 500 ml  Net -380 ml   Filed Weights   07/12/22 0100 07/13/22 0500 07/17/22 0843  Weight: 82.5 kg 83 kg 55.6 kg    Examination:  Physical Exam: GEN: NAD, alert and oriented x3, chronically ill/cachectic in appearance, appears older than stated age HEENT: NCAT, PERRL, EOMI, sclera clear, dry mucous membranes, poor dentition PULM: CTAB w/o wheezes/crackles, normal respiratory effort, on room air CV: RRR w/o M/G/R GI: abd soft, NTND, NABS, no R/G/M MSK: no peripheral edema, moves all extremities independently NEURO: CN II-XII intact, no focal deficits, sensation to light touch intact PSYCH: normal mood/affect Integumentary: dry/intact, no rashes or wounds    Data Reviewed: I have personally reviewed following labs and imaging studies  CBC: Recent Labs  Lab 07/11/22 1615 07/12/22 0258 07/13/22 0306  WBC 3.7* 5.3 6.0  NEUTROABS 2.6  --   --   HGB 12.0* 10.4* 9.2*  HCT 38.1* 33.2* 29.5*  MCV 84.5 84.9 84.8  PLT 268 223 0000000   Basic Metabolic Panel: Recent Labs  Lab 07/11/22 1634 07/12/22 0258 07/13/22 0306 07/14/22 0447 07/16/22 0359 07/17/22 0330  NA  --  142 133* 132* 135 134*  K  --  3.9 3.8 3.3* 3.8 3.9  CL  --  101 94* 88* 98 99  CO2  --  28 32 34* 31 29  GLUCOSE  --  104* 180* 142* 158* 237*  BUN  --  16 12 6* 18 16  CREATININE  --  0.42* 0.42* 0.33* 0.43* 0.66  CALCIUM  --  7.8* 8.2* 8.4* 8.7* 8.4*  MG 1.8  --  1.3* 1.3* 1.5* 1.6*  PHOS  --  3.2 2.1* 3.6 4.1  --    GFR: Estimated Creatinine Clearance: 74.3 mL/min (by C-G formula based on SCr of 0.66 mg/dL). Liver Function Tests: Recent Labs  Lab 07/11/22 1615 07/12/22 0258  AST 60* 43*  ALT 46* 37  ALKPHOS 120 97  BILITOT 1.0 0.8  PROT 7.1 6.1*  ALBUMIN 4.0 3.4*   Recent Labs  Lab 07/11/22 1615  LIPASE 19   Recent Labs  Lab 07/11/22 1617  AMMONIA 17    Coagulation Profile: Recent Labs  Lab 07/11/22 1615  INR 1.0   Cardiac Enzymes: No results for input(s): "CKTOTAL", "CKMB", "CKMBINDEX", "TROPONINI" in the last 168 hours. BNP (last 3 results) No results for input(s): "PROBNP" in the last 8760 hours. HbA1C: No results for input(s): "HGBA1C" in the last 72 hours.  CBG: Recent Labs  Lab 07/16/22 1135 07/16/22 1549 07/16/22 1932 07/17/22 0042 07/17/22 0740  GLUCAP 152* 203* 200* 268* 177*   Lipid Profile: No results for input(s): "CHOL", "HDL", "LDLCALC", "TRIG", "CHOLHDL", "LDLDIRECT" in the last 72 hours. Thyroid Function Tests: No results for input(s): "TSH", "T4TOTAL", "FREET4", "T3FREE", "THYROIDAB" in the last 72 hours.  Anemia Panel: No results for input(s): "VITAMINB12", "FOLATE", "FERRITIN", "TIBC", "IRON", "RETICCTPCT" in the last 72 hours. Sepsis Labs: Recent Labs  Lab 07/11/22 1615 07/11/22 2156 07/13/22 0306  LATICACIDVEN 2.2* 2.4* 0.9    Recent Results (from the past 240 hour(s))  Blood Culture (routine x 2)     Status: None   Collection Time: 07/11/22  4:15 PM   Specimen: BLOOD  Result Value Ref Range Status   Specimen Description   Final    BLOOD BLOOD RIGHT HAND Performed at [facility], [address], [place], [place] [address]    Special Requests   Final    BOTTLES DRAWN AEROBIC AND ANAEROBIC Blood Culture results may not be optimal due to an inadequate volume of blood received in culture bottles Performed at [facility], [address], [place], [place] [address]    Culture   Final    NO GROWTH 5 DAYS Performed at [facility], [address], [place], [place] [address]    Report Status 07/16/2022 FINAL  Final  Resp Panel by RT-PCR (Flu A&B, Covid) Anterior Nasal Swab     Status: None   Collection Time: 07/11/22  4:16 PM   Specimen: Anterior Nasal Swab  Result Value Ref Range Status   SARS Coronavirus 2 by RT PCR NEGATIVE NEGATIVE Final     Comment: (NOTE) SARS-CoV-2 target nucleic acids are NOT DETECTED.  The SARS-CoV-2 RNA is generally detectable in upper respiratory specimens during the acute phase of infection. The lowest concentration of SARS-CoV-2 viral copies this assay can detect is 138 copies/mL. A negative result does not preclude SARS-Cov-2 infection and should not be used as the sole basis for treatment or other patient management decisions. A negative result may occur with  improper specimen collection/handling, submission of specimen other than nasopharyngeal swab, presence of viral mutation(s) within the areas targeted by this assay, and inadequate number of viral  copies(<138 copies/mL). A negative result must be combined with clinical observations, patient history, and epidemiological information. The expected result is Negative.  Fact Sheet for Patients:  EntrepreneurPulse.com.au  Fact Sheet for Healthcare Providers:  IncredibleEmployment.be  This test is no t yet approved or cleared by the [place] FDA and  has been authorized for detection and/or diagnosis of SARS-CoV-2 by FDA under an Emergency Use Authorization (EUA). This EUA will remain  in effect (meaning this test can be used) for the duration of the COVID-19 declaration under Section 564(b)(1) of the Act, 21 U.S.C.section 360bbb-3(b)(1), unless the authorization is terminated  or revoked sooner.       Influenza A by PCR NEGATIVE NEGATIVE Final   Influenza B by PCR NEGATIVE NEGATIVE Final    Comment: (NOTE) The Xpert Xpress SARS-CoV-2/FLU/RSV plus assay is intended as an aid in the diagnosis of influenza from Nasopharyngeal swab specimens and should not be used as a sole basis for treatment. Nasal washings and aspirates are unacceptable for Xpert Xpress SARS-CoV-2/FLU/RSV testing.  Fact Sheet for Patients: EntrepreneurPulse.com.au  Fact Sheet for Healthcare  Providers: IncredibleEmployment.be  This test is not yet approved or cleared by the [place] FDA and has been authorized for detection and/or diagnosis of SARS-CoV-2 by FDA under an Emergency Use Authorization (EUA). This EUA will remain in effect (meaning this test can be used) for the duration of the COVID-19 declaration under Section 564(b)(1) of the Act, 21 U.S.C. section 360bbb-3(b)(1), unless the authorization is terminated or revoked.  Performed at [facility], [place] [address] [place], [place] [address]   Blood Culture (routine x 2)     Status: None   Collection Time: 07/11/22  4:20 PM   Specimen: BLOOD  Result Value Ref Range Status   Specimen Description   Final    BLOOD LEFT ANTECUBITAL Performed at [facility] [address], [place], [place] [address]    Special Requests   Final    BOTTLES DRAWN AEROBIC AND ANAEROBIC Blood Culture adequate volume Performed at [facility] [address] [place], [place] [address]    Culture   Final    NO GROWTH 5 DAYS Performed at [facility], [place] [address], [place], [place] [address]    Report Status 07/16/2022 FINAL  Final  Urine Culture     Status: None   Collection Time: 07/11/22  9:41 PM   Specimen: In/Out Cath Urine  Result Value Ref Range Status   Specimen Description   Final    IN/OUT CATH URINE Performed at [facility] [address] [place], [place] [address]    Special Requests   Final    NONE Performed at [facility], [place] [address] [place], [place] [address]    Culture   Final    NO GROWTH Performed at [facility], [place] [address] [place], [place] [address]    Report Status 07/12/2022 FINAL  Final  MRSA Next Gen by PCR, Nasal     Status: None   Collection Time: 07/12/22  4:03 AM   Specimen: Nasal Mucosa; Nasal Swab  Result Value Ref Range Status   MRSA by PCR Next  Gen NOT DETECTED NOT DETECTED Final    Comment: (NOTE) The GeneXpert MRSA Assay (FDA approved for NASAL specimens only), is one component of a comprehensive MRSA colonization surveillance program. It is not intended to diagnose MRSA infection nor to guide or monitor treatment for  MRSA infections. Test performance is not FDA approved in patients less than 65 years old. Performed at [facility], [place] [address] [place], [place] [address]          Radiology Studies: No results found.      Scheduled Meds:  cyanocobalamin  1,000 mcg Oral Daily   enoxaparin (LOVENOX) injection  40 mg Subcutaneous Q24H   ferrous sulfate  325 mg Oral Q breakfast   folic acid  1 mg Oral Daily   insulin aspart  0-9 Units Subcutaneous TID WC   metFORMIN  1,000 mg Oral BID WC   metoprolol tartrate  12.5 mg Oral BID   multivitamin with minerals  1 tablet Oral Daily   nicotine  21 mg Transdermal Daily   mouth rinse  15 mL Mouth Rinse 4 times per day   sodium chloride flush  3 mL Intravenous Q12H   thiamine  100 mg Oral Daily   Or   thiamine  100 mg Intravenous Daily   Continuous Infusions:  magnesium sulfate bolus IVPB 4 g (07/17/22 0923)      LOS: 5 days    Time spent: 49 minutes spent on chart review, discussion with nursing staff, consultants, updating family and interview/physical exam; more than 50% of that time was spent in counseling and/or coordination of care.    Kadience Macchi J [place] DO [facility] Available via Epic secure chat 7am-7pm After these hours, please refer to coverage provider listed on [email or web address] 07/17/2022, 10:55 AM

## 2022-07-18 ENCOUNTER — Telehealth: Payer: Self-pay

## 2022-07-18 NOTE — Telephone Encounter (Signed)
Transition Care Management Unsuccessful Follow-up Telephone Call  Date of discharge and from where:  07/17/2022, [facility]  Attempts:  1st Attempt  Reason for unsuccessful TCM follow-up call:  Left voice message on # [phone], call back requested.   Need to schedule a hospital follow up appointment

## 2022-07-19 ENCOUNTER — Telehealth: Payer: Self-pay

## 2022-07-19 NOTE — Telephone Encounter (Signed)
Transition Care Management Unsuccessful Follow-up Telephone Call  Date of discharge and from where:  07/17/2022, [facility]   Attempts:  2nd Attempt  Reason for unsuccessful TCM follow-up call:  Left voice message on # [phone], call back requested.    Need to schedule a hospital follow up appointment

## 2022-07-20 ENCOUNTER — Telehealth: Payer: Self-pay

## 2022-07-20 NOTE — Telephone Encounter (Signed)
Transition Care Management Unsuccessful Follow-up Telephone Call  Date of discharge and from where:   07/17/2022, [facility]    Attempts:  3rd Attempt  Reason for unsuccessful TCM follow-up call:  Left voice message  on # [phone], call back requested .  Letter also sent to patient requesting he call [facility] to schedule a hospital follow up appointment as we have not been able to reach him.

## 2022-08-28 ENCOUNTER — Other Ambulatory Visit: Payer: Self-pay

## 2023-04-19 ENCOUNTER — Ambulatory Visit: Payer: Self-pay

## 2023-04-19 NOTE — Telephone Encounter (Signed)
Call placed X  2 to patient unable to reach message left on VM.

## 2023-04-19 NOTE — Telephone Encounter (Signed)
Weak  Didn't want anyone to help house dirty  Self neglect  No difficulty breathing Called Momma I need help Weak not sure why  Breathing ok  Cominmg for a while    Chief Complaint: weakness Symptoms: just mentioned fatigue  Frequency: "for a while" Pertinent Negatives: Patient denies chest pain, SOB  Disposition: [] ED /[] Urgent Care (no appt availability in office) / [] Appointment(In office/virtual)/ []  [facility]/ [] Home Care/ [x] Refused Recommended Disposition /[] [facility]/ []  Follow-up with PCP Additional Notes: Refused appt or UC or ED. Mother called to report he called her and said he needs help. Stated he was weak and not sure why. Advised mother pt refused appt or any disposition. Pt was a poor historian and was edgy with a hint of anger. Mother sated she will call pt to try and get him in to office.   Reason for Disposition  [1] MODERATE weakness (i.e., interferes with work, school, normal activities) AND [2] cause unknown  (Exceptions: Weakness from acute minor illness or poor fluid intake; weakness is chronic and not worse.)  Answer Assessment - Initial Assessment Questions 1. DESCRIPTION: "Describe how you are feeling."     Weak  2. SEVERITY: "How bad is it?"  "Can you stand and walk?"   - MILD (0-3): Feels weak or tired, but does not interfere with work, school or normal activities.   - MODERATE (4-7): Able to stand and walk; weakness interferes with work, school, or normal activities.   - SEVERE (8-10): Unable to stand or walk; unable to do usual activities.     Took much effort to get clothes on- is walking 3. ONSET: "When did these symptoms begin?" (e.g., hours, days, weeks, months)     "For a while" 4. CAUSE: "What do you think is causing the weakness or fatigue?" (e.g., not drinking enough fluids, medical problem, trouble sleeping)     "Low oxygen" 'I dont know" 5. NEW MEDICINES:  "Have you started on any new medicines recently?" (e.g.,  opioid pain medicines, benzodiazepines, muscle relaxants, antidepressants, antihistamines, neuroleptics, beta blockers)     N/a 6. OTHER SYMPTOMS: "Do you have any other symptoms?" (e.g., chest pain, fever, cough, SOB, vomiting, diarrhea, bleeding, other areas of pain)     weakness 7. PREGNANCY: "Is there any chance you are pregnant?" "When was your last menstrual period?"     N/a  Protocols used: Weakness (Generalized) and Fatigue-A-AH

## 2023-05-15 ENCOUNTER — Telehealth: Payer: Self-pay | Admitting: Licensed Clinical Social Worker

## 2023-05-15 ENCOUNTER — Ambulatory Visit: Payer: Self-pay

## 2023-05-15 NOTE — Telephone Encounter (Signed)
  Chief Complaint: Pt is doing poorly Symptoms: Drinking a lot unable to stand. Living in unclean environment. Frequency: Ongoing  - getting worse Pertinent Negatives: Patient denies  Disposition: [] ED /[] Urgent Care (no appt availability in office) / [] Appointment(In office/virtual)/ []  [facility]/ [] Home Care/ [] Refused Recommended Disposition /[] [facility]/ [x]  Follow-up with PCP Additional Notes: Call from pt's mother, Darlyne Russian. [name] states that he son is in bad shape. He is drinking a lot. He cannot stand. His home is very dirty, and it is getting worse. Mom took pt to the grocery store yesterday and pt was unable to walk into the store. He sat outside and waited for mom to return. Pt told mom that all he wanted was some cigarettes and then to go home. Mom purchased [place] take out for pt, pt did not eat any of it. Pt has asked people to leave his home.  Mom is very worried that pt will soon die without intervention. Mom states that pt has said he does not want to die.  Pt has refused all medical treatment/ help.  Mom would like help from PCP for her son.  Please advise.    Reason for Disposition  [1] Caller requests to speak ONLY to PCP AND [2] URGENT question  Answer Assessment - Initial Assessment Questions 1. REASON FOR CALL or QUESTION: "What is your reason for calling today?" or "How can I best help you?" or "What question do you have that I can help answer?"     Pt is in bad way 2. CALLER: Document the source of call. (e.g., laboratory, patient).     Pt's mother  Protocols used: PCP Call - No Triage-A-AH

## 2023-05-15 NOTE — Telephone Encounter (Signed)
Received a call from pt mother concerned about her sons well being. LCSWA recommended she contact DSS Adult Protective Services and Non Emergency Line to do a check in with him to address his mental health needs and well being. When calling pt he did not answer.

## 2023-05-15 NOTE — Telephone Encounter (Signed)
Sure lets chat

## 2023-05-28 ENCOUNTER — Other Ambulatory Visit: Payer: Self-pay

## 2023-05-28 ENCOUNTER — Emergency Department (HOSPITAL_BASED_OUTPATIENT_CLINIC_OR_DEPARTMENT_OTHER): Payer: Medicaid Other | Admitting: Radiology

## 2023-05-28 ENCOUNTER — Inpatient Hospital Stay (HOSPITAL_BASED_OUTPATIENT_CLINIC_OR_DEPARTMENT_OTHER)
Admission: EM | Admit: 2023-05-28 | Discharge: 2023-06-01 | Disposition: A | Discharge status: Home / Self Care | Payer: Medicaid Other | Attending: Internal Medicine | Admitting: Internal Medicine

## 2023-05-28 ENCOUNTER — Emergency Department (HOSPITAL_BASED_OUTPATIENT_CLINIC_OR_DEPARTMENT_OTHER): Payer: Medicaid Other

## 2023-05-28 ENCOUNTER — Encounter (HOSPITAL_BASED_OUTPATIENT_CLINIC_OR_DEPARTMENT_OTHER): Payer: Self-pay | Admitting: Emergency Medicine

## 2023-05-28 DIAGNOSIS — R0902 Hypoxemia: Secondary | ICD-10-CM

## 2023-05-28 DIAGNOSIS — Z681 Body mass index (BMI) 19 or less, adult: Secondary | ICD-10-CM | POA: Diagnosis not present

## 2023-05-28 DIAGNOSIS — E871 Hypo-osmolality and hyponatremia: Secondary | ICD-10-CM | POA: Diagnosis present

## 2023-05-28 DIAGNOSIS — Z7984 Long term (current) use of oral hypoglycemic drugs: Secondary | ICD-10-CM

## 2023-05-28 DIAGNOSIS — J441 Chronic obstructive pulmonary disease with (acute) exacerbation: Secondary | ICD-10-CM | POA: Diagnosis not present

## 2023-05-28 DIAGNOSIS — J432 Centrilobular emphysema: Secondary | ICD-10-CM | POA: Diagnosis not present

## 2023-05-28 DIAGNOSIS — Z91199 Patient's noncompliance with other medical treatment and regimen due to unspecified reason: Secondary | ICD-10-CM

## 2023-05-28 DIAGNOSIS — J440 Chronic obstructive pulmonary disease with acute lower respiratory infection: Secondary | ICD-10-CM | POA: Diagnosis present

## 2023-05-28 DIAGNOSIS — J189 Pneumonia, unspecified organism: Principal | ICD-10-CM | POA: Diagnosis present

## 2023-05-28 DIAGNOSIS — E440 Moderate protein-calorie malnutrition: Secondary | ICD-10-CM | POA: Diagnosis present

## 2023-05-28 DIAGNOSIS — R531 Weakness: Secondary | ICD-10-CM

## 2023-05-28 DIAGNOSIS — J209 Acute bronchitis, unspecified: Secondary | ICD-10-CM | POA: Diagnosis present

## 2023-05-28 DIAGNOSIS — F1010 Alcohol abuse, uncomplicated: Secondary | ICD-10-CM | POA: Diagnosis present

## 2023-05-28 DIAGNOSIS — E785 Hyperlipidemia, unspecified: Secondary | ICD-10-CM | POA: Diagnosis present

## 2023-05-28 DIAGNOSIS — F17210 Nicotine dependence, cigarettes, uncomplicated: Secondary | ICD-10-CM | POA: Diagnosis present

## 2023-05-28 DIAGNOSIS — Z1152 Encounter for screening for COVID-19: Secondary | ICD-10-CM

## 2023-05-28 DIAGNOSIS — R918 Other nonspecific abnormal finding of lung field: Secondary | ICD-10-CM | POA: Diagnosis not present

## 2023-05-28 DIAGNOSIS — T17890A Other foreign object in other parts of respiratory tract causing asphyxiation, initial encounter: Secondary | ICD-10-CM | POA: Diagnosis not present

## 2023-05-28 DIAGNOSIS — I10 Essential (primary) hypertension: Secondary | ICD-10-CM | POA: Diagnosis present

## 2023-05-28 DIAGNOSIS — E1165 Type 2 diabetes mellitus with hyperglycemia: Secondary | ICD-10-CM | POA: Diagnosis not present

## 2023-05-28 DIAGNOSIS — Z833 Family history of diabetes mellitus: Secondary | ICD-10-CM | POA: Diagnosis not present

## 2023-05-28 DIAGNOSIS — R911 Solitary pulmonary nodule: Secondary | ICD-10-CM | POA: Diagnosis not present

## 2023-05-28 HISTORY — DX: Type 2 diabetes mellitus without complications: E11.9

## 2023-05-28 LAB — CBC WITH DIFFERENTIAL/PLATELET
Abs Immature Granulocytes: 0.03 10*3/uL (ref 0.00–0.07)
Basophils Absolute: 0.1 10*3/uL (ref 0.0–0.1)
Basophils Relative: 1 %
Eosinophils Absolute: 0 10*3/uL (ref 0.0–0.5)
Eosinophils Relative: 0 %
HCT: 43.3 % (ref 39.0–52.0)
Hemoglobin: 14.5 g/dL (ref 13.0–17.0)
Immature Granulocytes: 0 %
Lymphocytes Relative: 8 %
Lymphs Abs: 0.8 10*3/uL (ref 0.7–4.0)
MCH: 31.9 pg (ref 26.0–34.0)
MCHC: 33.5 g/dL (ref 30.0–36.0)
MCV: 95.2 fL (ref 80.0–100.0)
Monocytes Absolute: 0.8 10*3/uL (ref 0.1–1.0)
Monocytes Relative: 8 %
Neutro Abs: 8.1 10*3/uL — ABNORMAL HIGH (ref 1.7–7.7)
Neutrophils Relative %: 83 %
Platelets: 171 10*3/uL (ref 150–400)
RBC: 4.55 MIL/uL (ref 4.22–5.81)
RDW: 15.5 % (ref 11.5–15.5)
WBC: 9.8 10*3/uL (ref 4.0–10.5)
nRBC: 0 % (ref 0.0–0.2)

## 2023-05-28 LAB — TROPONIN I (HIGH SENSITIVITY)
Troponin I (High Sensitivity): 8 ng/L (ref <18)
Troponin I (High Sensitivity): 7 ng/L (ref <18)

## 2023-05-28 LAB — URINALYSIS, ROUTINE W REFLEX MICROSCOPIC
Bacteria, UA: NONE SEEN
Bilirubin Urine: NEGATIVE
Glucose, UA: 500 mg/dL — AB
Hgb urine dipstick: NEGATIVE
Ketones, ur: NEGATIVE mg/dL
Leukocytes,Ua: NEGATIVE
Nitrite: NEGATIVE
Protein, ur: NEGATIVE mg/dL
Specific Gravity, Urine: 1.017 (ref 1.005–1.030)
pH: 5.5 (ref 5.0–8.0)

## 2023-05-28 LAB — RESP PANEL BY RT-PCR (RSV, FLU A&B, COVID)  RVPGX2
Influenza A by PCR: NEGATIVE
Influenza B by PCR: NEGATIVE
Resp Syncytial Virus by PCR: NEGATIVE
SARS Coronavirus 2 by RT PCR: NEGATIVE

## 2023-05-28 LAB — COMPREHENSIVE METABOLIC PANEL
ALT: 18 U/L (ref 0–44)
AST: 20 U/L (ref 15–41)
Albumin: 3.8 g/dL (ref 3.5–5.0)
Alkaline Phosphatase: 84 U/L (ref 38–126)
Anion gap: 8 (ref 5–15)
BUN: 7 mg/dL — ABNORMAL LOW (ref 8–23)
CO2: 34 mmol/L — ABNORMAL HIGH (ref 22–32)
Calcium: 8.7 mg/dL — ABNORMAL LOW (ref 8.9–10.3)
Chloride: 87 mmol/L — ABNORMAL LOW (ref 98–111)
Creatinine, Ser: 0.36 mg/dL — ABNORMAL LOW (ref 0.61–1.24)
GFR, Estimated: >60 mL/min (ref >60)
Glucose, Bld: 211 mg/dL — ABNORMAL HIGH (ref 70–99)
Potassium: 4.2 mmol/L (ref 3.5–5.1)
Sodium: 129 mmol/L — ABNORMAL LOW (ref 135–145)
Total Bilirubin: 1 mg/dL (ref 0.3–1.2)
Total Protein: 6.2 g/dL — ABNORMAL LOW (ref 6.5–8.1)

## 2023-05-28 LAB — FOLATE: Folate: 15.2 ng/mL (ref >5.9)

## 2023-05-28 LAB — CBC
HCT: 38.7 % — ABNORMAL LOW (ref 39.0–52.0)
Hemoglobin: 12.4 g/dL — ABNORMAL LOW (ref 13.0–17.0)
MCH: 30.8 pg (ref 26.0–34.0)
MCHC: 32 g/dL (ref 30.0–36.0)
MCV: 96 fL (ref 80.0–100.0)
Platelets: 143 10*3/uL — ABNORMAL LOW (ref 150–400)
RBC: 4.03 MIL/uL — ABNORMAL LOW (ref 4.22–5.81)
RDW: 15.7 % — ABNORMAL HIGH (ref 11.5–15.5)
WBC: 6.5 10*3/uL (ref 4.0–10.5)
nRBC: 0 % (ref 0.0–0.2)

## 2023-05-28 LAB — CREATININE, SERUM
Creatinine, Ser: 0.3 mg/dL — ABNORMAL LOW (ref 0.61–1.24)
GFR, Estimated: >60 mL/min (ref >60)

## 2023-05-28 LAB — HEMOGLOBIN A1C
Hgb A1c MFr Bld: 7.4 % — ABNORMAL HIGH (ref 4.8–5.6)
Mean Plasma Glucose: 165.68 mg/dL

## 2023-05-28 LAB — CBG MONITORING, ED: Glucose-Capillary: 209 mg/dL — ABNORMAL HIGH (ref 70–99)

## 2023-05-28 LAB — HIV ANTIBODY (ROUTINE TESTING W REFLEX): HIV Screen 4th Generation wRfx: NONREACTIVE

## 2023-05-28 LAB — MAGNESIUM: Magnesium: 1.4 mg/dL — ABNORMAL LOW (ref 1.7–2.4)

## 2023-05-28 LAB — GLUCOSE, CAPILLARY: Glucose-Capillary: 247 mg/dL — ABNORMAL HIGH (ref 70–99)

## 2023-05-28 LAB — VITAMIN B12: Vitamin B-12: 486 pg/mL (ref 180–914)

## 2023-05-28 LAB — ETHANOL: Alcohol, Ethyl (B): <10 mg/dL (ref <10)

## 2023-05-28 MED ORDER — SODIUM CHLORIDE 0.9 % IV SOLN
500.0000 mg | INTRAVENOUS | Status: DC
  Administered 2023-05-29 – 2023-05-31 (×3): 500 mg via INTRAVENOUS
  Filled 2023-05-28 (×4): qty 5

## 2023-05-28 MED ORDER — SODIUM CHLORIDE 0.9 % IV SOLN
500.0000 mg | Freq: Once | INTRAVENOUS | Status: AC
  Administered 2023-05-28: 500 mg via INTRAVENOUS
  Filled 2023-05-28: qty 5

## 2023-05-28 MED ORDER — ACETAMINOPHEN 325 MG PO TABS
650.0000 mg | ORAL_TABLET | Freq: Four times a day (QID) | ORAL | Status: DC | PRN
  Filled 2023-05-28: qty 2

## 2023-05-28 MED ORDER — SODIUM CHLORIDE 0.9 % IV SOLN: INTRAVENOUS | Status: DC

## 2023-05-28 MED ORDER — NICOTINE 21 MG/24HR TD PT24
21.0000 mg | MEDICATED_PATCH | Freq: Every day | TRANSDERMAL | Status: DC
  Administered 2023-05-28 – 2023-06-01 (×5): 21 mg via TRANSDERMAL
  Filled 2023-05-28 (×5): qty 1

## 2023-05-28 MED ORDER — ADULT MULTIVITAMIN W/MINERALS CH
1.0000 | ORAL_TABLET | Freq: Once | ORAL | Status: AC
  Administered 2023-05-28: 1 via ORAL
  Filled 2023-05-28: qty 1

## 2023-05-28 MED ORDER — ACETAMINOPHEN 650 MG RE SUPP: 650.0000 mg | Freq: Four times a day (QID) | RECTAL | Status: DC | PRN

## 2023-05-28 MED ORDER — THIAMINE HCL 100 MG/ML IJ SOLN
100.0000 mg | Freq: Once | INTRAMUSCULAR | Status: AC
  Administered 2023-05-28: 100 mg via INTRAVENOUS
  Filled 2023-05-28: qty 2

## 2023-05-28 MED ORDER — SODIUM CHLORIDE 0.9 % IV BOLUS
1000.0000 mL | Freq: Once | INTRAVENOUS | Status: AC
  Administered 2023-05-28: 1000 mL via INTRAVENOUS

## 2023-05-28 MED ORDER — NICOTINE 7 MG/24HR TD PT24
7.0000 mg | MEDICATED_PATCH | Freq: Once | TRANSDERMAL | Status: DC
  Administered 2023-05-28: 7 mg via TRANSDERMAL
  Filled 2023-05-28: qty 1

## 2023-05-28 MED ORDER — MAGNESIUM SULFATE IN D5W 1-5 GM/100ML-% IV SOLN
1.0000 g | Freq: Once | INTRAVENOUS | Status: AC
  Administered 2023-05-28: 1 g via INTRAVENOUS
  Filled 2023-05-28: qty 100

## 2023-05-28 MED ORDER — IOHEXOL 300 MG/ML  SOLN
100.0000 mL | Freq: Once | INTRAMUSCULAR | Status: AC | PRN
  Administered 2023-05-28: 60 mL via INTRAVENOUS

## 2023-05-28 MED ORDER — INSULIN ASPART 100 UNIT/ML IJ SOLN
0.0000 [IU] | Freq: Three times a day (TID) | INTRAMUSCULAR | Status: DC
  Administered 2023-05-29: 1 [IU] via SUBCUTANEOUS
  Administered 2023-05-29: 7 [IU] via SUBCUTANEOUS
  Administered 2023-05-30: 9 [IU] via SUBCUTANEOUS
  Administered 2023-05-30 – 2023-05-31 (×2): 3 [IU] via SUBCUTANEOUS
  Administered 2023-05-31: 2 [IU] via SUBCUTANEOUS
  Administered 2023-06-01: 5 [IU] via SUBCUTANEOUS
  Administered 2023-06-01: 3 [IU] via SUBCUTANEOUS

## 2023-05-28 MED ORDER — SODIUM CHLORIDE 0.9 % IV SOLN: INTRAVENOUS | Status: DC | PRN

## 2023-05-28 MED ORDER — ENOXAPARIN SODIUM 40 MG/0.4ML IJ SOSY
40.0000 mg | PREFILLED_SYRINGE | INTRAMUSCULAR | Status: DC
  Administered 2023-05-28 – 2023-05-31 (×4): 40 mg via SUBCUTANEOUS
  Filled 2023-05-28 (×4): qty 0.4

## 2023-05-28 MED ORDER — SODIUM CHLORIDE 0.9 % IV SOLN
1.0000 g | Freq: Once | INTRAVENOUS | Status: AC
  Administered 2023-05-28: 1 g via INTRAVENOUS
  Filled 2023-05-28: qty 10

## 2023-05-28 MED ORDER — ALBUTEROL SULFATE (2.5 MG/3ML) 0.083% IN NEBU: 2.5000 mg | INHALATION_SOLUTION | Freq: Four times a day (QID) | RESPIRATORY_TRACT | Status: DC | PRN

## 2023-05-28 MED ORDER — SODIUM CHLORIDE 0.9 % IV SOLN
2.0000 g | INTRAVENOUS | Status: DC
  Administered 2023-05-28: 2 g via INTRAVENOUS
  Filled 2023-05-28 (×2): qty 20

## 2023-05-28 NOTE — ED Triage Notes (Addendum)
C/o being "excessively tired" for several weeks. States he has no energy at all. Denies pain. States appetite is good. States he used to drink alcohol daily but has not had any in 3 weeks

## 2023-05-28 NOTE — Progress Notes (Addendum)
Plan of Care Note for accepted transfer  Patient: [name]    HYQ:[id number]  DOA: 05/28/2023     Nursing staff, Please call [facility] Admits & Consults System-Wide number on Amion as soon as patient's arrival to the unit (not the listed attending) so that the appropriate admitting provider can evaluate the pt. ASAP to avoid any delay in care.  Facility requesting transfer: [facility] Requesting Provider: Lenard Simmer, PA-C  Reason for transfer: Admission Facility course: Patient with PMH of HLD, type II DM, COPD, HTN, alcohol abuse present to the hospital with complaints of generalized lethargy and weakness as well as cough.  Workup showed the patient has right lower lobe pneumonia with mucous plugging on CT scan of chest.  Hypoxic to 87% on room air and tachypnea. Blood pressure is also soft. Started on IV antibiotic.  Incidentally, also found to have right middle lobe nodule measuring up to 1.4 cm. Repeat CT chest recommended 4 weeks.  Plan of care: The patient is accepted for admission to Telemetry unit. Requesting to check a B1 level, alcohol level and blood cultures.  Author: Lynden Oxford, MD  05/28/2023  Check [email or web address] for on-call coverage.

## 2023-05-28 NOTE — ED Notes (Signed)
Pt given peanut butter crackers and ginger ale per PA approval.

## 2023-05-28 NOTE — ED Provider Notes (Signed)
[place] EMERGENCY DEPARTMENT AT [facility] Provider Note   CSN: [id number] Arrival date & time: 05/28/23  1022     History  Chief Complaint  Patient presents with   Weakness    [name] is a 62 y.o. male with past medical history significant for tobacco abuse, COPD, alcohol abuse, diabetes, pancreatitis presents to the ED complaining of excessive fatigue and generalized weakness for the past few months.  He states it has been progressively worsening to the point where doing simple tasks "requires a great effort".  Patient's mother at bedside also expresses concern about patient's weight loss and general appearance.  Patient reports he used to consume alcohol daily, but has not had any in the past 3 weeks.  He states that he has had a normal appetite.  Patient does have a cough, which he says is chronic and a "smoker's cough".  Denies drug use, fever, shortness of breath, chest pain, nausea, vomiting, diarrhea, numbness, unilateral weakness, syncope, dizziness, light-headedness.         Home Medications Prior to Admission medications   Medication Sig Start Date End Date Taking? Authorizing Provider  atorvastatin (LIPITOR) 10 MG tablet Take 1 tablet (10 mg total) by mouth daily. Patient not taking: Reported on 07/12/2022 02/07/22   Marcine Matar, MD  Blood Glucose Monitoring Suppl (TRUE METRIX AIR GLUCOSE METER) w/Device KIT 1 kit by Does not apply route 2 (two) times daily at 8 am and 10 pm. 09/17/18   Fulp, Cammie, MD  ferrous sulfate 325 (65 FE) MG tablet Take 1 tablet (325 mg total) by mouth daily with breakfast. Patient not taking: Reported on 07/12/2022 02/15/22   Marcine Matar, MD  glucose blood (TRUE METRIX BLOOD GLUCOSE TEST) test strip Use as instructed-fasting before breakfast; 2 hours after largest meal of the day and before bedtime 09/17/18   Fulp, Cammie, MD  metFORMIN (GLUCOPHAGE) 1000 MG tablet Take 1 tablet (1,000 mg total) by mouth 2 (two) times  daily with a meal. 02/07/22   Marcine Matar, MD  TRUEPLUS LANCETS 28G MISC Use when checking blood sugar fasting, at bedtime and about 2 hours after largest meal of the day 09/17/18   Cain Saupe, MD      Allergies    Patient has no known allergies.    Review of Systems   Review of Systems  Constitutional:  Positive for fatigue and unexpected weight change (weight loss). Negative for appetite change, chills and fever.  Respiratory:  Positive for cough (chronic). Negative for shortness of breath.   Cardiovascular:  Negative for chest pain.  Gastrointestinal:  Negative for diarrhea, nausea and vomiting.  Neurological:  Positive for weakness (generalized). Negative for dizziness, syncope, light-headedness and numbness.    Physical Exam Updated Vital Signs BP 107/80   Pulse 73   Temp 98.3 F (36.8 C) (Oral)   Resp (!) 23   Ht 6' (1.829 m)   SpO2 100%   BMI 16.62 kg/m  Physical Exam Vitals and nursing note reviewed.  Constitutional:      General: He is not in acute distress.    Appearance: He is underweight. He is ill-appearing. He is not diaphoretic.     Comments: Poor hygiene  HENT:     Mouth/Throat:     Mouth: Mucous membranes are moist.     Pharynx: Oropharynx is clear.  Eyes:     Extraocular Movements: Extraocular movements intact.     Conjunctiva/sclera: Conjunctivae normal.  Pupils: Pupils are equal, round, and reactive to light.  Cardiovascular:     Rate and Rhythm: Normal rate and regular rhythm.  Pulmonary:     Effort: Tachypnea present. No accessory muscle usage or respiratory distress.     Breath sounds: Normal air entry. Examination of the right-middle field reveals rhonchi. Examination of the right-lower field reveals rhonchi. Rhonchi present.  Skin:    General: Skin is warm and dry.     Capillary Refill: Capillary refill takes less than 2 seconds.  Neurological:     General: No focal deficit present.     Mental Status: He is alert and oriented to  person, place, and time. Mental status is at baseline.     GCS: GCS eye subscore is 4. GCS verbal subscore is 5. GCS motor subscore is 6.     Cranial Nerves: Cranial nerves 2-12 are intact.     Sensory: Sensation is intact.     Motor: Motor function is intact.     Coordination: Coordination is intact.     Gait: Gait is intact.     Comments: Cranial Nerves:  II: peripheral fields grossly intact III,IV, VI: ptosis not present, extra-ocular movements intact bilaterally, direct and consensual pupillary light reflexes intact bilaterally V: facial sensation, jaw opening, and bite strength equal bilaterally VII: eyebrow raise, eyelid close, smile, frown, pucker equal bilaterally VIII: hearing grossly normal bilaterally  IX,X: palate elevation and swallowing intact XI: bilateral shoulder shrug and lateral head rotation equal and strong XII: midline tongue extension Motor: 5/5 strength in bilateral upper and lower extremities Sensation grossly intact to light touch.  Patient is able to sit, stand, and walk without assistance.    Psychiatric:        Mood and Affect: Mood normal.        Behavior: Behavior normal.     ED Results / Procedures / Treatments   Labs (all labs ordered are listed, but only abnormal results are displayed) Labs Reviewed  URINALYSIS, ROUTINE W REFLEX MICROSCOPIC - Abnormal; Notable for the following components:      Result Value   Glucose, UA 500 (*)    All other components within normal limits  CBC WITH DIFFERENTIAL/PLATELET - Abnormal; Notable for the following components:   Neutro Abs 8.1 (*)    All other components within normal limits  COMPREHENSIVE METABOLIC PANEL - Abnormal; Notable for the following components:   Sodium 129 (*)    Chloride 87 (*)    CO2 34 (*)    Glucose, Bld 211 (*)    BUN 7 (*)    Creatinine, Ser 0.36 (*)    Calcium 8.7 (*)    Total Protein 6.2 (*)    All other components within normal limits  MAGNESIUM - Abnormal; Notable for the  following components:   Magnesium 1.4 (*)    All other components within normal limits  CBG MONITORING, ED - Abnormal; Notable for the following components:   Glucose-Capillary 209 (*)    All other components within normal limits  RESP PANEL BY RT-PCR (RSV, FLU A&B, COVID)  RVPGX2  CULTURE, BLOOD (ROUTINE X 2)  CULTURE, BLOOD (ROUTINE X 2)  ETHANOL  FOLATE  VITAMIN B12  VITAMIN B1  TROPONIN I (HIGH SENSITIVITY)  TROPONIN I (HIGH SENSITIVITY)    EKG EKG Interpretation Date/Time:  Monday May 28 2023 11:10:31 EDT Ventricular Rate:  88 PR Interval:  158 QRS Duration:  90 QT Interval:  375 QTC Calculation: 454 R Axis:  18  Text Interpretation: Sinus rhythm Low voltage, extremity leads Abnormal R-wave progression, late transition Abnrm T, consider ischemia, anterolateral lds Confirmed by Ernie Avena ([id number]) on 05/28/2023 12:53:14 PM  Radiology CT Chest W Contrast  Result Date: 05/28/2023 CLINICAL DATA:  Respiratory illness, nondiagnostic xray EXAM: CT CHEST WITH CONTRAST TECHNIQUE: Multidetector CT imaging of the chest was performed during intravenous contrast administration. RADIATION DOSE REDUCTION: This exam was performed according to the departmental dose-optimization program which includes automated exposure control, adjustment of the mA and/or kV according to patient size and/or use of iterative reconstruction technique. CONTRAST:  60mL OMNIPAQUE IOHEXOL 300 MG/ML  SOLN COMPARISON:  Today.  CT 09/15/2010 FINDINGS: Cardiovascular: Heart is normal size. Aorta is normal caliber. Diffusely calcified coronary arteries. Scattered aortic calcifications. No aneurysm or dissection. Mediastinum/Nodes: No mediastinal, hilar, or axillary adenopathy. Trachea and esophagus are unremarkable. Thyroid unremarkable. Lungs/Pleura: Moderate centrilobular emphysema. Peripheral right rib lower lobe interstitial thickening and ground-glass opacities. Nodular density in the right middle lobe peripherally  measures 1.4 cm. Nodular density in the right middle lobe on image 104 measures 6 mm. There is mucous plugging within the right lower lobe airways. Minimal left basilar opacity, likely atelectasis. No effusions. Upper Abdomen: No acute findings Musculoskeletal: Chest wall soft tissues are unremarkable. No acute bony abnormality. IMPRESSION: Peripheral ground-glass opacities and interstitial thickening in the right middle lobe and right lower lobe. Mucous plugging within the lower lobe airways. Areas of nodularity in the right middle lobe measuring up to 1.4 cm. Legrand Rams this reflects an infectious or inflammatory process, possibly bronchopneumonia. Followup chest CT is recommended in 3-4 weeks following trial of antibiotic therapy to ensure resolution and exclude underlying malignancy. Diffuse coronary artery disease. Aortic Atherosclerosis (ICD10-I70.0) and Emphysema (ICD10-J43.9). Electronically Signed   By: Charlett Nose M.D.   On: 05/28/2023 15:02   DG Chest 1 View  Result Date: 05/28/2023 CLINICAL DATA:  Weakness. EXAM: CHEST  1 VIEW COMPARISON:  July 11, 2022. FINDINGS: The heart size and mediastinal contours are within normal limits. Minimal bibasilar subsegmental atelectasis or scarring is noted. Hyperexpansion of the lungs is noted. The visualized skeletal structures are unremarkable. IMPRESSION: Minimal bibasilar subsegmental atelectasis or scarring. Electronically Signed   By: Lupita Raider M.D.   On: 05/28/2023 13:27    Procedures Procedures    Medications Ordered in ED Medications  nicotine (NICODERM CQ - dosed in mg/24 hr) patch 7 mg (7 mg Transdermal Patch Applied 05/28/23 1448)  0.9 %  sodium chloride infusion (0 mLs Intravenous Stopped 05/28/23 1748)  sodium chloride 0.9 % bolus 1,000 mL (0 mLs Intravenous Stopped 05/28/23 1232)  magnesium sulfate IVPB 1 g 100 mL (0 g Intravenous Stopped 05/28/23 1428)  multivitamin with minerals tablet 1 tablet (1 tablet Oral Given 05/28/23 1347)   thiamine (VITAMIN B1) injection 100 mg (100 mg Intravenous Given 05/28/23 1348)  iohexol (OMNIPAQUE) 300 MG/ML solution 100 mL (60 mLs Intravenous Contrast Given 05/28/23 1437)  cefTRIAXone (ROCEPHIN) 1 g in sodium chloride 0.9 % 100 mL IVPB (0 g Intravenous Stopped 05/28/23 1605)  azithromycin (ZITHROMAX) 500 mg in sodium chloride 0.9 % 250 mL IVPB (0 mg Intravenous Stopped 05/28/23 1746)    ED Course/ Medical Decision Making/ A&P                             Medical Decision Making Amount and/or Complexity of Data Reviewed Labs: ordered. Radiology: ordered.  Risk OTC drugs. Prescription drug management. Decision  regarding hospitalization.   This patient presents to the ED with chief complaint(s) of generalized weakness, fatigue with pertinent past medical history of tobacco abuse, alcohol abuse, diabetes, hypertension, COPD.  The complaint involves an extensive differential diagnosis and also carries with it a high risk of complications and morbidity.    The differential diagnosis includes metabolic derangement, electrolyte disturbance, malnutrition, dehydration, toxic ingestion, acute on chronic respiratory failure, failure to thrive, vitamin deficiency, infectious etiology, ACS  The initial plan is to obtain baseline labs, chest x-ray, EKG  Additional history obtained: Additional history obtained from  mother at bedside who expresses concerns about patient's weight loss and overall wellbeing. Records reviewed  -patient is mother reached out to social work with concern about her son's well-appearing and LCSW who recommended she contact DSS Adult Protective Services a nonemergency line to do a welfare check.  Patient was also admitted in August 2023 for altered mental status and severe alcohol intoxication.  Initial Assessment:   Exam significant for ill-appearing, underweight patient who is not in acute distress.  Patient is tachypneic, but is able to speak in full sentences.  Patient  with poor hygiene.  Rhonchi appreciated in right lung fields, adequate tidal volume.  Skin is warm and dry.  Heart rate is normal in the 70s with regular rhythm.  Abdomen is soft and nontender to palpation.  Neuro exam is reassuring.  Independent ECG/labs interpretation:  The following labs were independently interpreted:  CBC without leukocytosis or anemia.  Metabolic panel with hyponatremia, hypocalcemia, and decreased total protein.  There is also hyperglycemia at 211.  BUN and creatinine are decreased.  Magnesium low at 1.4.  Negative for COVID, flu, RSV.  Troponins are negative.  Ethanol less than 10.  UA without infection, there is glucosuria.  Independent visualization and interpretation of imaging: I independently visualized the following imaging with scope of interpretation limited to determining acute life threatening conditions related to emergency care: chest x-ray, which revealed minimal bibasilar subsegmental atelectasis or scarring.  CT chest with contrast ordered as follow up which demonstrated peripheral ground glass opacities and interstitial thickening in the right middle and lower lobes.  Mucus plugging within lower lobe airways.  Areas of nodularity in right middle lobe with largest 1.4 cm.  Infectious vs inflammatory vs malignancy.  I agree with radiologist interpretation.   Treatment and Reassessment: Patient given IV fluids with improvement in blood pressure.  While discussing blood results with patient, observed that he was 87 to 88% while on room air with a good [place].  Patient does have rhonchi on the right side and is also tachypneic.  Will place him on 2L/min O2 via [place].  Patient CT concerning for CAP.  Will treat with Rocephin and azithromycin.  Patient also is underweight and has history of alcohol abuse.  Will give vitamins and electrolytes to replenish deficiencies.  Patient was also given food and drink which he tolerated without difficulty.  Consultations obtained:    I requested consultation with on-call hospitalist and spoke with Dr. Lynden Oxford who agreed with hospital admission.   Disposition:   Patient to be admitted to [facility] and transferred via [facility].  Patient understands he is being admitted to the hospital and is in agreement with this plan.  Social Determinants of Health:   Patient's  tobacco abuse, alcohol abuse   increases the complexity of managing their presentation         Final Clinical Impression(s) / ED Diagnoses Final diagnoses:  Generalized  weakness  Hypoxia  Community acquired pneumonia of right lung, unspecified part of lung    Rx / DC Orders ED Discharge Orders     None         Lenard Simmer, PA-C 05/28/23 1800    Ernie Avena, MD 05/29/23 1135

## 2023-05-28 NOTE — H&P (Signed)
History and Physical    Patient: [name] ZOX:[id number] DOB: [birth year] DOA: 05/28/2023 DOS: the patient was seen and examined on 05/28/2023 PCP: Marcine Matar, MD  Patient coming from: Outside Hospital  Chief Complaint:  Chief Complaint  Patient presents with   Weakness   HPI: [name] is a 62 y.o. male with medical history significant of COPD, diabetes, pancreatitis hypertension, hyperlipidemia, alcohol abuse presents with generalized lethargy and weakness for the past few months. He has found it progressively difficult to complete simple tasks. His mother, per the ED notes was also concerned about his weight loss. Pt usually has a chronic cough because he is a long term smoker. No recent change in his cough. Denies chest pain, palpitations, dizziness, dyspnea or fevers. Denies sick contacts.  Denies nausea,vomiting, diarrhea, abdominal pain, rectal bleeding or melena. Smokes a pack of cigarettes a day, hx of alcohol abuse but has not had a drink in 3 months.  Imaging CXR: Bibasilar subsegmental atelectasis or scarring CT chest: Peripheral ground-glass opacities and interstitial thickening in the right middle lobe and right lower lobe. Mucous plugging within the lower lobe airways. Areas of nodularity in the right middle lobe measuring up to 1.4 cm. Legrand Rams this reflects an infectious or inflammatory process, possibly bronchopneumonia. Followup chest CT is recommended in 3-4 weeks following trial of antibiotic therapy to ensure resolution and exclude underlying malignancy.  ED course Vital signs on admission: Afebrile, heart rate 88, respiratory rate 20, BP 99/76, sats 94% on 2 L.  Sodium 129, glucose 211, creatinine 0.36 magnesium 1.4, CBC within normal limits, UA glucosuria.  Patient received IV azithromycin, Rocephin museum sulfate, 1 L sodium chloride bolus in the ED.  Review of Systems: As mentioned in the history of present illness. All other systems  reviewed and are negative. Past Medical History:  Diagnosis Date   COPD (chronic obstructive pulmonary disease) (HCC)    Diabetes mellitus without complication (HCC)    Pancreatitis    Past Surgical History:  Procedure Laterality Date   NO PAST SURGERIES     Social History:  reports that he has been smoking cigarettes. He has a 35 pack-year smoking history. He has never used smokeless tobacco. He reports current alcohol use. He reports that he does not use drugs.  No Known Allergies  Family History  Problem Relation Age of Onset   Diabetes Sister     Prior to Admission medications   Medication Sig Start Date End Date Taking? Authorizing Provider  atorvastatin (LIPITOR) 10 MG tablet Take 1 tablet (10 mg total) by mouth daily. Patient not taking: Reported on 07/12/2022 02/07/22   Marcine Matar, MD  Blood Glucose Monitoring Suppl (TRUE METRIX AIR GLUCOSE METER) w/Device KIT 1 kit by Does not apply route 2 (two) times daily at 8 am and 10 pm. 09/17/18   Fulp, Cammie, MD  ferrous sulfate 325 (65 FE) MG tablet Take 1 tablet (325 mg total) by mouth daily with breakfast. Patient not taking: Reported on 07/12/2022 02/15/22   Marcine Matar, MD  glucose blood (TRUE METRIX BLOOD GLUCOSE TEST) test strip Use as instructed-fasting before breakfast; 2 hours after largest meal of the day and before bedtime 09/17/18   Fulp, Cammie, MD  metFORMIN (GLUCOPHAGE) 1000 MG tablet Take 1 tablet (1,000 mg total) by mouth 2 (two) times daily with a meal. 02/07/22   Marcine Matar, MD  TRUEPLUS LANCETS 28G MISC Use when checking blood sugar fasting, at bedtime and about 2  hours after largest meal of the day 09/17/18   Cain Saupe, MD    Physical Exam: Vitals:   05/28/23 1215 05/28/23 1455 05/28/23 1458 05/28/23 1830  BP: 100/76 107/80  98/77  Pulse: 74 73  81  Resp: 13 (!) 23  19  Temp:   98.3 F (36.8 C) 98.3 F (36.8 C)  TempSrc:   Oral Oral  SpO2: 93% 100%  94%  Height:       General:  Alert, no acute distress, nasal cannula 2L, frail appearing  Cardio: Normal S1 and S2, RRR, no r/m/g Pulm: poor AE left lung, rales throughout right lung  Abdomen: Bowel sounds normal. Abdomen soft and non-tender.  Extremities: No peripheral edema.  Neuro: Cranial nerves grossly intact   Data Reviewed:  Assessment and Plan:  Community-acquired pneumonia -Admit to medical telemetry -Vitals per floor routine -Wean oxygen as able, goal sats> 94% -Continue IV azithromycin, IV ceftriaxone -Further 1 L bolus -Continue maintenance IV fluids -CBC CMP Mg a.m. -PT OT am -Lovenox for DVT prophylaxis -Incentive spirometry   Type 2 diabetes 211 on BMP.  Last A1c 8.2 on file 07/03/2022 -Holding home metformin -Repeat A1c -CBGs 4 times daily with meals and bedtime -Continue sliding scale insulin  COPD Long term smoker, not on any inhalers -PRN albuterol nebs  -Would benefit from PCP follow up   Hx of ETOH use disorder Used to drink 8-10 beers a day for several years. Last drink was a few months ago. -Continue to monitor  Hyperlipidemia Pt is non compliant Lipitor 10 mg -Lipid panel -Encourage PCP follow up   Pseudohyponatremia Corrected sodium 131 in setting of hyperglycemia -Monitor with BMP  Hypomagnesemia Mag 1.4 s/p 1 g repletion -Continue to monitor  Tobacco use disorder  Packs a day, smoking since teens  -Nicotine patch   Right middle lobe nodule Incidental finding on CT chest, nodule measuring up to 1.4 cm.  -Repeat CT chest recommended 4 weeks.   Advance Care Planning:   Code Status: Prior FULL   Consults:   Family Communication:   Severity of Illness: The appropriate patient status for this patient is OBSERVATION. Observation status is judged to be reasonable and necessary in order to provide the required intensity of service to ensure the patient's safety. The patient's presenting symptoms, physical exam findings, and initial radiographic and laboratory  data in the context of their medical condition is felt to place them at decreased risk for further clinical deterioration. Furthermore, it is anticipated that the patient will be medically stable for discharge from the hospital within 2 midnights of admission.   Author: Rolm Gala, MD 05/28/2023 6:45 PM  For on call review [email or web address].

## 2023-05-29 DIAGNOSIS — E785 Hyperlipidemia, unspecified: Secondary | ICD-10-CM | POA: Diagnosis present

## 2023-05-29 DIAGNOSIS — J219 Acute bronchiolitis, unspecified: Secondary | ICD-10-CM

## 2023-05-29 DIAGNOSIS — J209 Acute bronchitis, unspecified: Secondary | ICD-10-CM | POA: Diagnosis present

## 2023-05-29 DIAGNOSIS — R531 Weakness: Secondary | ICD-10-CM | POA: Diagnosis not present

## 2023-05-29 DIAGNOSIS — J9601 Acute respiratory failure with hypoxia: Secondary | ICD-10-CM | POA: Diagnosis not present

## 2023-05-29 DIAGNOSIS — Z91199 Patient's noncompliance with other medical treatment and regimen due to unspecified reason: Secondary | ICD-10-CM | POA: Diagnosis not present

## 2023-05-29 DIAGNOSIS — J449 Chronic obstructive pulmonary disease, unspecified: Secondary | ICD-10-CM | POA: Diagnosis not present

## 2023-05-29 DIAGNOSIS — E1165 Type 2 diabetes mellitus with hyperglycemia: Secondary | ICD-10-CM | POA: Diagnosis present

## 2023-05-29 DIAGNOSIS — E871 Hypo-osmolality and hyponatremia: Secondary | ICD-10-CM | POA: Diagnosis present

## 2023-05-29 DIAGNOSIS — Z833 Family history of diabetes mellitus: Secondary | ICD-10-CM | POA: Diagnosis not present

## 2023-05-29 DIAGNOSIS — I10 Essential (primary) hypertension: Secondary | ICD-10-CM | POA: Diagnosis present

## 2023-05-29 DIAGNOSIS — R0902 Hypoxemia: Secondary | ICD-10-CM | POA: Diagnosis not present

## 2023-05-29 DIAGNOSIS — F17210 Nicotine dependence, cigarettes, uncomplicated: Secondary | ICD-10-CM | POA: Diagnosis present

## 2023-05-29 DIAGNOSIS — Z7984 Long term (current) use of oral hypoglycemic drugs: Secondary | ICD-10-CM | POA: Diagnosis not present

## 2023-05-29 DIAGNOSIS — J440 Chronic obstructive pulmonary disease with acute lower respiratory infection: Secondary | ICD-10-CM | POA: Diagnosis present

## 2023-05-29 DIAGNOSIS — J441 Chronic obstructive pulmonary disease with (acute) exacerbation: Secondary | ICD-10-CM | POA: Diagnosis not present

## 2023-05-29 DIAGNOSIS — F1010 Alcohol abuse, uncomplicated: Secondary | ICD-10-CM | POA: Diagnosis present

## 2023-05-29 DIAGNOSIS — J189 Pneumonia, unspecified organism: Secondary | ICD-10-CM | POA: Diagnosis not present

## 2023-05-29 DIAGNOSIS — T17890A Other foreign object in other parts of respiratory tract causing asphyxiation, initial encounter: Secondary | ICD-10-CM | POA: Diagnosis present

## 2023-05-29 DIAGNOSIS — Z1152 Encounter for screening for COVID-19: Secondary | ICD-10-CM | POA: Diagnosis not present

## 2023-05-29 DIAGNOSIS — Z681 Body mass index (BMI) 19 or less, adult: Secondary | ICD-10-CM | POA: Diagnosis not present

## 2023-05-29 DIAGNOSIS — E440 Moderate protein-calorie malnutrition: Secondary | ICD-10-CM | POA: Diagnosis present

## 2023-05-29 LAB — PHOSPHORUS: Phosphorus: 2.6 mg/dL (ref 2.5–4.6)

## 2023-05-29 LAB — RESPIRATORY PANEL BY PCR

## 2023-05-29 LAB — COMPREHENSIVE METABOLIC PANEL
ALT: 24 U/L (ref 0–44)
AST: 20 U/L (ref 15–41)
Albumin: 2.5 g/dL — ABNORMAL LOW (ref 3.5–5.0)
Alkaline Phosphatase: 77 U/L (ref 38–126)
Anion gap: 7 (ref 5–15)
BUN: <5 mg/dL — ABNORMAL LOW (ref 8–23)
CO2: 34 mmol/L — ABNORMAL HIGH (ref 22–32)
Calcium: 7.6 mg/dL — ABNORMAL LOW (ref 8.9–10.3)
Chloride: 90 mmol/L — ABNORMAL LOW (ref 98–111)
Creatinine, Ser: 0.33 mg/dL — ABNORMAL LOW (ref 0.61–1.24)
GFR, Estimated: >60 mL/min (ref >60)
Glucose, Bld: 121 mg/dL — ABNORMAL HIGH (ref 70–99)
Potassium: 3.9 mmol/L (ref 3.5–5.1)
Sodium: 131 mmol/L — ABNORMAL LOW (ref 135–145)
Total Bilirubin: 0.1 mg/dL — ABNORMAL LOW (ref 0.3–1.2)
Total Protein: 5 g/dL — ABNORMAL LOW (ref 6.5–8.1)

## 2023-05-29 LAB — CBC
HCT: 39.4 % (ref 39.0–52.0)
Hemoglobin: 12.5 g/dL — ABNORMAL LOW (ref 13.0–17.0)
MCH: 30.9 pg (ref 26.0–34.0)
MCHC: 31.7 g/dL (ref 30.0–36.0)
MCV: 97.5 fL (ref 80.0–100.0)
Platelets: 149 10*3/uL — ABNORMAL LOW (ref 150–400)
RBC: 4.04 MIL/uL — ABNORMAL LOW (ref 4.22–5.81)
RDW: 15.6 % — ABNORMAL HIGH (ref 11.5–15.5)
WBC: 6.4 10*3/uL (ref 4.0–10.5)
nRBC: 0 % (ref 0.0–0.2)

## 2023-05-29 LAB — LIPID PANEL
Cholesterol: 89 mg/dL (ref 0–200)
HDL: 40 mg/dL — ABNORMAL LOW (ref >40)
LDL Cholesterol: 42 mg/dL (ref 0–99)
Total CHOL/HDL Ratio: 2.2 RATIO
Triglycerides: 34 mg/dL (ref <150)
VLDL: 7 mg/dL (ref 0–40)

## 2023-05-29 LAB — GLUCOSE, CAPILLARY
Glucose-Capillary: 150 mg/dL — ABNORMAL HIGH (ref 70–99)
Glucose-Capillary: 224 mg/dL — ABNORMAL HIGH (ref 70–99)
Glucose-Capillary: 302 mg/dL — ABNORMAL HIGH (ref 70–99)
Glucose-Capillary: 385 mg/dL — ABNORMAL HIGH (ref 70–99)

## 2023-05-29 LAB — BRAIN NATRIURETIC PEPTIDE: B Natriuretic Peptide: 207.3 pg/mL — ABNORMAL HIGH (ref 0.0–100.0)

## 2023-05-29 LAB — PROCALCITONIN: Procalcitonin: <0.1 ng/mL

## 2023-05-29 MED ORDER — ONDANSETRON HCL 4 MG/2ML IJ SOLN: 4.0000 mg | Freq: Four times a day (QID) | INTRAMUSCULAR | Status: DC | PRN

## 2023-05-29 MED ORDER — METOPROLOL TARTRATE 5 MG/5ML IV SOLN: 5.0000 mg | INTRAVENOUS | Status: DC | PRN

## 2023-05-29 MED ORDER — HYDRALAZINE HCL 20 MG/ML IJ SOLN: 10.0000 mg | INTRAMUSCULAR | Status: DC | PRN

## 2023-05-29 MED ORDER — IPRATROPIUM-ALBUTEROL 0.5-2.5 (3) MG/3ML IN SOLN: 3.0000 mL | RESPIRATORY_TRACT | Status: DC | PRN

## 2023-05-29 MED ORDER — BUDESONIDE 0.5 MG/2ML IN SUSP
0.5000 mg | Freq: Two times a day (BID) | RESPIRATORY_TRACT | Status: DC
  Administered 2023-05-30 – 2023-06-01 (×5): 0.5 mg via RESPIRATORY_TRACT
  Filled 2023-05-29 (×7): qty 2

## 2023-05-29 MED ORDER — TRAZODONE HCL 50 MG PO TABS
50.0000 mg | ORAL_TABLET | Freq: Every evening | ORAL | Status: DC | PRN
  Administered 2023-05-29: 50 mg via ORAL
  Filled 2023-05-29: qty 1

## 2023-05-29 MED ORDER — GUAIFENESIN 100 MG/5ML PO LIQD
5.0000 mL | ORAL | Status: DC | PRN
  Administered 2023-05-30: 5 mL via ORAL
  Filled 2023-05-29: qty 5

## 2023-05-29 MED ORDER — SENNOSIDES-DOCUSATE SODIUM 8.6-50 MG PO TABS: 1.0000 | ORAL_TABLET | Freq: Every evening | ORAL | Status: DC | PRN

## 2023-05-29 MED ORDER — METHYLPREDNISOLONE SODIUM SUCC 40 MG IJ SOLR
40.0000 mg | Freq: Two times a day (BID) | INTRAMUSCULAR | Status: DC
  Administered 2023-05-29 (×2): 40 mg via INTRAVENOUS
  Filled 2023-05-29 (×2): qty 1

## 2023-05-29 MED ORDER — GUAIFENESIN ER 600 MG PO TB12
1200.0000 mg | ORAL_TABLET | Freq: Two times a day (BID) | ORAL | Status: DC
  Administered 2023-05-29 – 2023-06-01 (×7): 1200 mg via ORAL
  Filled 2023-05-29 (×7): qty 2

## 2023-05-29 MED ORDER — IPRATROPIUM-ALBUTEROL 0.5-2.5 (3) MG/3ML IN SOLN: 3.0000 mL | Freq: Three times a day (TID) | RESPIRATORY_TRACT | Status: DC

## 2023-05-29 MED ORDER — CALCIUM GLUCONATE-NACL 2-0.675 GM/100ML-% IV SOLN
2.0000 g | Freq: Once | INTRAVENOUS | Status: AC
  Administered 2023-05-29: 2000 mg via INTRAVENOUS
  Filled 2023-05-29: qty 100

## 2023-05-29 MED ORDER — IPRATROPIUM-ALBUTEROL 0.5-2.5 (3) MG/3ML IN SOLN
3.0000 mL | Freq: Four times a day (QID) | RESPIRATORY_TRACT | Status: DC
  Filled 2023-05-29: qty 3

## 2023-05-29 MED ORDER — IPRATROPIUM-ALBUTEROL 0.5-2.5 (3) MG/3ML IN SOLN
3.0000 mL | Freq: Three times a day (TID) | RESPIRATORY_TRACT | Status: DC
  Administered 2023-05-29: 3 mL via RESPIRATORY_TRACT

## 2023-05-29 NOTE — Progress Notes (Signed)
OT NOTE  OT NOTE  OT evaluation completed at this time. OT formal evaluation to follow. Pt currently requires Min (A) to sit<>Stand and recommendation for skilled inpatient follow up therapy, <3 hours/day (pending progression). Mother of patient present and expressed concerns with patients ability to care for himself at this time and care.   Pt requires calm and single step commands to progress this session. Pt easily agitated with 2 step commands and cognitive deficits noted. Pt reports reason for admission "weakness" at this time and no recall of PNA dx.    Mateo Flow   OTR/L Pager: [phone] Office: [phone]

## 2023-05-29 NOTE — Inpatient Diabetes Management (Signed)
Inpatient Diabetes Program Recommendations  AACE/ADA: New Consensus Statement on Inpatient Glycemic Control (2015)  Target Ranges:  Prepandial:   less than 140 mg/dL      Peak postprandial:   less than 180 mg/dL (1-2 hours)      Critically ill patients:  140 - 180 mg/dL   Lab Results  Component Value Date   GLUCAP 150 (H) 05/29/2023   HGBA1C 7.4 (H) 05/28/2023    Review of Glycemic Control  Latest Reference Range & Units 05/28/23 11:20 05/28/23 22:06 05/29/23 06:18  Glucose-Capillary 70 - 99 mg/dL 657 (H) 846 (H) 962 (H)   Diabetes history: DM 2 Outpatient Diabetes medications:  None (no longer taking Metformin) Current orders for Inpatient glycemic control:  Novolog 0-9 units tid with meals  Inpatient Diabetes Program Recommendations:    If blood sugars continue to be >goal, consider adding Semglee 5 units daily.   Thanks,  Beryl Meager, RN, BC-ADM Inpatient Diabetes Coordinator Pager [phone]  (8a-5p)

## 2023-05-29 NOTE — Progress Notes (Signed)
0845 This RN in room. Pt's O2 off. Sats in the 80's. O2 placed back on. Sat's WDL. This RN educated pt on importance of keeping oxygen on. Per pt, "The oxygen causes me to have a headache. I will remove it as often as I need to resolve my headache". Dr. Nelson Chimes made aware.

## 2023-05-29 NOTE — Evaluation (Signed)
Physical Therapy Evaluation Patient Details Name: [name] MRN: [id number] DOB: September 05, 1961 Today's Date: 05/29/2023  History of Present Illness  Pt is a 62 y.o. male admitted 7/15 with CAP. PMH:  COPD, DM, pancreatitis, HTN, hyperlipidemia, ETOH abuse  Clinical Impression  Pt admitted with above diagnosis. PTA pt lived at home with his mother, independent household mobility without AD. No home O2 at baseline. Pt currently with functional limitations due to the deficits listed below (see PT Problem List). On eval, pt demonstrated mod I bed mobility. He required supervision sit to stand. Pt declining ambulation. SpO2 90% on 2L. Pt will benefit from acute skilled PT to increase their independence and safety with mobility to allow discharge.  PT to follow acutely, primarily to further assess O2 needs with activity. Suspect he will progress quickly with mobility and not required PT follow up.          Assistance Recommended at Discharge PRN  If plan is discharge home, recommend the following:  Can travel by private vehicle  Assistance with cooking/housework;Assist for transportation;Help with stairs or ramp for entrance        Equipment Recommendations None recommended by PT  Recommendations for Other Services       Functional Status Assessment Patient has had a recent decline in their functional status and demonstrates the ability to make significant improvements in function in a reasonable and predictable amount of time.     Precautions / Restrictions Precautions Precautions: Other (comment) Precaution Comments: watch vitals      Mobility  Bed Mobility Overal bed mobility: Modified Independent             General bed mobility comments: increased time    Transfers Overall transfer level: Needs assistance Equipment used: None Transfers: Sit to/from Stand Sit to Stand: Supervision           General transfer comment: increased time    Ambulation/Gait                General Gait Details: Pt declining despite encouragement. Stating "ma'am I am just not getting on my feet today. I don't feel up to it."  Stairs            Wheelchair Mobility     Tilt Bed    Modified Rankin (Stroke Patients Only)       Balance Overall balance assessment: Needs assistance Sitting-balance support: No upper extremity supported, Feet supported Sitting balance-Leahy Scale: Good     Standing balance support: No upper extremity supported Standing balance-Leahy Scale: Fair Standing balance comment: static without UE support                             Pertinent Vitals/Pain Pain Assessment Pain Assessment: No/denies pain    Home Living Family/patient expects to be discharged to:: Private residence Living Arrangements: Parent Available Help at Discharge: Family;Available 24 hours/day Type of Home: House Home Access: Stairs to enter   [employer] of Steps: 1   Home Layout: One level Home Equipment: None Additional Comments: moved in with his mother several months ago. She is in good health and able to provide assist as needed.    Prior Function Prior Level of Function : Needs assist             Mobility Comments: primarily household mobility only, independent without AD ADLs Comments: Pt's mother performs iADLs     Hand Dominance  Extremity/Trunk Assessment   Upper Extremity Assessment Upper Extremity Assessment: Defer to OT evaluation    Lower Extremity Assessment Lower Extremity Assessment: Generalized weakness    Cervical / Trunk Assessment Cervical / Trunk Assessment: Kyphotic  Communication   Communication: No difficulties  Cognition Arousal/Alertness: Awake/alert Behavior During Therapy: Agitated (mildly) Overall Cognitive Status: Within Functional Limits for tasks assessed                                          General Comments General comments (skin  integrity, edema, etc.): Pt on RA on arrival as his [place] was around his neck. SpO2 87%. 2L replaced with improvement to 90%.    Exercises     Assessment/Plan    PT Assessment Patient needs continued PT services  PT Problem List Decreased strength;Decreased balance;Decreased mobility;Cardiopulmonary status limiting activity;Decreased activity tolerance       PT Treatment Interventions DME instruction;Functional mobility training;Balance training;Patient/family education;Therapeutic activities;Gait training;Therapeutic exercise;Stair training    PT Goals (Current goals can be found in the Care Plan section)  Acute Rehab PT Goals Patient Stated Goal: not stated PT Goal Formulation: With patient Time For Goal Achievement: 06/12/23 Potential to Achieve Goals: Good    Frequency Min 3X/week     Co-evaluation               AM-PAC PT "6 Clicks" Mobility  Outcome Measure Help needed turning from your back to your side while in a flat bed without using bedrails?: None Help needed moving from lying on your back to sitting on the side of a flat bed without using bedrails?: None Help needed moving to and from a bed to a chair (including a wheelchair)?: A Little Help needed standing up from a chair using your arms (e.g., wheelchair or bedside chair)?: A Little Help needed to walk in hospital room?: A Little Help needed climbing 3-5 steps with a railing? : A Little 6 Click Score: 20    End of Session Equipment Utilized During Treatment: Oxygen Activity Tolerance: Patient limited by fatigue;Treatment limited secondary to agitation Patient left: in bed;with call bell/phone within reach;with bed alarm set Nurse Communication: Mobility status PT Visit Diagnosis: Muscle weakness (generalized) (M62.81);Difficulty in walking, not elsewhere classified (R26.2)    Time: 0981-1914 PT Time Calculation (min) (ACUTE ONLY): 14 min   Charges:   PT Evaluation $PT Eval Low Complexity: 1 Low    PT General Charges $$ ACUTE PT VISIT: 1 Visit         Ferd Glassing., PT  Office # [phone]   Ilda Foil 05/29/2023, 8:16 AM

## 2023-05-29 NOTE — Progress Notes (Signed)
PROGRESS NOTE    [name]  OZH:[id number] DOB: [birth year] DOA: 05/28/2023 PCP: Marcine Matar, MD   Brief Narrative:  62 year old with history of COPD, diabetes mellitus type 2, pancreatitis, HTN, HLD, alcohol abuse presents to the hospital with generalized lethargy and weakness ongoing for past few weeks and having difficulty completing simple tasks.  He has also had weight loss during this time.  Chest x-ray shows bibasilar atelectasis.  CT chest concerning for peripheral groundglass opacity especially on the right lower base with mucous plugging concerning for bronchopneumonia.  Patient was diagnosed with community-acquired pneumonia and admitted on IV Rocephin and azithromycin.   Assessment & Plan:   Acute bronchitis/COPD exacerbation Active tobacco use - Procalcitonin negative, discontinue Rocephin.  Will continue azithromycin.  Viral panel was also negative.  Start Solu-Medrol - Bronchodilators scheduled and as needed.  I-S/flutter valve.  Generalized weakness - Check TSH - B12, folate are normal.  Check CK levels. -PT/OT   Type 2 diabetes -A1c 7.4.  Holding metformin - Sliding scale and Accu-Cheks     Hx of ETOH use disorder Used to drink 8-10 beers a day for several years. Last drink was a few months ago. -Continue to monitor   Hyperlipidemia Lipid panel appears to be stable.  Continue outpatient Lipitor   Hyponatremia Sodium stable at 131   Hypomagnesemia/hypocalcemia Repletion Check phosphorus   Right middle lobe nodule Incidental finding on CT chest, nodule measuring up to 1.4 cm.  -Repeat CT chest recommended 4 weeks.    DVT prophylaxis: Lovenox Code Status: Full code Family Communication:   Still has significant abnormal breath sounds.  Continue hospital stay.       Diet Orders (From admission, onward)     Start     Ordered   05/28/23 1900  Diet regular Room service appropriate? Yes; Fluid consistency: Thin  Diet effective now        Question Answer Comment  Room service appropriate? Yes   Fluid consistency: Thin      05/28/23 1901            Subjective: Still having exertional shortness of breath even with minimal exertion.   Examination:  General exam: Appears calm and comfortable ; 2L [place] Respiratory system: diffuse b/l diminished BS Cardiovascular system: S1 & S2 heard, RRR. No JVD, murmurs, rubs, gallops or clicks. No pedal edema. Gastrointestinal system: Abdomen is nondistended, soft and nontender. No organomegaly or masses felt. Normal bowel sounds heard. Central nervous system: Alert and oriented. No focal neurological deficits. Extremities: Symmetric 5 x 5 power. Skin: No rashes, lesions or ulcers Psychiatry: Judgement and insight appear normal. Mood & affect appropriate.  Objective: Vitals:   05/28/23 1957 05/28/23 2324 05/29/23 0359 05/29/23 0407  BP: (!) 89/63 118/82 112/80   Pulse: 79 85 88   Resp: 18 17 18    Temp: 97.9 F (36.6 C) (!) 97.5 F (36.4 C) 98 F (36.7 C)   TempSrc: Oral  Oral   SpO2: 93% (!) 87% (!) 81% 91%  Height:        Intake/Output Summary (Last 24 hours) at 05/29/2023 0714 Last data filed at 05/28/2023 1748 Gross per 24 hour  Intake 1456.1 ml  Output --  Net 1456.1 ml   There were no vitals filed for this visit.  Scheduled Meds:  enoxaparin (LOVENOX) injection  40 mg Subcutaneous Q24H   insulin aspart  0-9 Units Subcutaneous TID WC   nicotine  21 mg Transdermal Daily   Continuous Infusions:  sodium chloride Stopped (05/28/23 1748)   sodium chloride 65 mL/hr at 05/28/23 2020   azithromycin     cefTRIAXone (ROCEPHIN)  IV 2 g (05/28/23 2123)    Nutritional status     Body mass index is 16.4 kg/m.  Data Reviewed:   CBC: Recent Labs  Lab 05/28/23 1110 05/28/23 1933 05/29/23 0305  WBC 9.8 6.5 6.4  NEUTROABS 8.1*  --   --   HGB 14.5 12.4* 12.5*  HCT 43.3 38.7* 39.4  MCV 95.2 96.0 97.5  PLT 171 143* 149*   Basic Metabolic Panel: Recent  Labs  Lab 05/28/23 1110 05/28/23 1933 05/29/23 0305  NA 129*  --  131*  K 4.2  --  3.9  CL 87*  --  90*  CO2 34*  --  34*  GLUCOSE 211*  --  121*  BUN 7*  --  <5*  CREATININE 0.36* 0.30* 0.33*  CALCIUM 8.7*  --  7.6*  MG 1.4*  --   --    GFR: CrCl cannot be calculated (Unknown ideal weight.). Liver Function Tests: Recent Labs  Lab 05/28/23 1110 05/29/23 0305  AST 20 20  ALT 18 24  ALKPHOS 84 77  BILITOT 1.0 0.1*  PROT 6.2* 5.0*  ALBUMIN 3.8 2.5*   No results for input(s): "LIPASE", "AMYLASE" in the last 168 hours. No results for input(s): "AMMONIA" in the last 168 hours. Coagulation Profile: No results for input(s): "INR", "PROTIME" in the last 168 hours. Cardiac Enzymes: No results for input(s): "CKTOTAL", "CKMB", "CKMBINDEX", "TROPONINI" in the last 168 hours. BNP (last 3 results) No results for input(s): "PROBNP" in the last 8760 hours. HbA1C: Recent Labs    05/28/23 1933  HGBA1C 7.4*   CBG: Recent Labs  Lab 05/28/23 1120 05/28/23 2206 05/29/23 0618  GLUCAP 209* 247* 150*   Lipid Profile: Recent Labs    05/29/23 0305  CHOL 89  HDL 40*  LDLCALC 42  TRIG 34  CHOLHDL 2.2   Thyroid Function Tests: No results for input(s): "TSH", "T4TOTAL", "FREET4", "T3FREE", "THYROIDAB" in the last 72 hours. Anemia Panel: Recent Labs    05/28/23 1110  VITAMINB12 486  FOLATE 15.2   Sepsis Labs: No results for input(s): "PROCALCITON", "LATICACIDVEN" in the last 168 hours.  Recent Results (from the past 240 hour(s))  Resp panel by RT-PCR (RSV, Flu A&B, Covid) Anterior Nasal Swab     Status: None   Collection Time: 05/28/23  2:47 PM   Specimen: Anterior Nasal Swab  Result Value Ref Range Status   SARS Coronavirus 2 by RT PCR NEGATIVE NEGATIVE Final    Comment: (NOTE) SARS-CoV-2 target nucleic acids are NOT DETECTED.  The SARS-CoV-2 RNA is generally detectable in upper respiratory specimens during the acute phase of infection. The lowest concentration of  SARS-CoV-2 viral copies this assay can detect is 138 copies/mL. A negative result does not preclude SARS-Cov-2 infection and should not be used as the sole basis for treatment or other patient management decisions. A negative result may occur with  improper specimen collection/handling, submission of specimen other than nasopharyngeal swab, presence of viral mutation(s) within the areas targeted by this assay, and inadequate number of viral copies(<138 copies/mL). A negative result must be combined with clinical observations, patient history, and epidemiological information. The expected result is Negative.  Fact Sheet for Patients:  BloggerCourse.com  Fact Sheet for Healthcare Providers:  SeriousBroker.it  This test is no t yet approved or cleared by the [place] FDA and  has been  authorized for detection and/or diagnosis of SARS-CoV-2 by FDA under an Emergency Use Authorization (EUA). This EUA will remain  in effect (meaning this test can be used) for the duration of the COVID-19 declaration under Section 564(b)(1) of the Act, 21 U.S.C.section 360bbb-3(b)(1), unless the authorization is terminated  or revoked sooner.       Influenza A by PCR NEGATIVE NEGATIVE Final   Influenza B by PCR NEGATIVE NEGATIVE Final    Comment: (NOTE) The Xpert Xpress SARS-CoV-2/FLU/RSV plus assay is intended as an aid in the diagnosis of influenza from Nasopharyngeal swab specimens and should not be used as a sole basis for treatment. Nasal washings and aspirates are unacceptable for Xpert Xpress SARS-CoV-2/FLU/RSV testing.  Fact Sheet for Patients: BloggerCourse.com  Fact Sheet for Healthcare Providers: SeriousBroker.it  This test is not yet approved or cleared by the [place] FDA and has been authorized for detection and/or diagnosis of SARS-CoV-2 by FDA under an Emergency Use  Authorization (EUA). This EUA will remain in effect (meaning this test can be used) for the duration of the COVID-19 declaration under Section 564(b)(1) of the Act, 21 U.S.C. section 360bbb-3(b)(1), unless the authorization is terminated or revoked.     Resp Syncytial Virus by PCR NEGATIVE NEGATIVE Final    Comment: (NOTE) Fact Sheet for Patients: BloggerCourse.com  Fact Sheet for Healthcare Providers: SeriousBroker.it  This test is not yet approved or cleared by the [place] FDA and has been authorized for detection and/or diagnosis of SARS-CoV-2 by FDA under an Emergency Use Authorization (EUA). This EUA will remain in effect (meaning this test can be used) for the duration of the COVID-19 declaration under Section 564(b)(1) of the Act, 21 U.S.C. section 360bbb-3(b)(1), unless the authorization is terminated or revoked.  Performed at [facility], [address] [place], [place] [address]          Radiology Studies: CT Chest W Contrast  Result Date: 05/28/2023 CLINICAL DATA:  Respiratory illness, nondiagnostic xray EXAM: CT CHEST WITH CONTRAST TECHNIQUE: Multidetector CT imaging of the chest was performed during intravenous contrast administration. RADIATION DOSE REDUCTION: This exam was performed according to the departmental dose-optimization program which includes automated exposure control, adjustment of the mA and/or kV according to patient size and/or use of iterative reconstruction technique. CONTRAST:  60mL OMNIPAQUE IOHEXOL 300 MG/ML  SOLN COMPARISON:  Today.  CT 09/15/2010 FINDINGS: Cardiovascular: Heart is normal size. Aorta is normal caliber. Diffusely calcified coronary arteries. Scattered aortic calcifications. No aneurysm or dissection. Mediastinum/Nodes: No mediastinal, hilar, or axillary adenopathy. Trachea and esophagus are unremarkable. Thyroid unremarkable. Lungs/Pleura: Moderate  centrilobular emphysema. Peripheral right rib lower lobe interstitial thickening and ground-glass opacities. Nodular density in the right middle lobe peripherally measures 1.4 cm. Nodular density in the right middle lobe on image 104 measures 6 mm. There is mucous plugging within the right lower lobe airways. Minimal left basilar opacity, likely atelectasis. No effusions. Upper Abdomen: No acute findings Musculoskeletal: Chest wall soft tissues are unremarkable. No acute bony abnormality. IMPRESSION: Peripheral ground-glass opacities and interstitial thickening in the right middle lobe and right lower lobe. Mucous plugging within the lower lobe airways. Areas of nodularity in the right middle lobe measuring up to 1.4 cm. Legrand Rams this reflects an infectious or inflammatory process, possibly bronchopneumonia. Followup chest CT is recommended in 3-4 weeks following trial of antibiotic therapy to ensure resolution and exclude underlying malignancy. Diffuse coronary artery disease. Aortic Atherosclerosis (ICD10-I70.0) and Emphysema (ICD10-J43.9). Electronically Signed   By: Charlett Nose M.D.  On: 05/28/2023 15:02   DG Chest 1 View  Result Date: 05/28/2023 CLINICAL DATA:  Weakness. EXAM: CHEST  1 VIEW COMPARISON:  July 11, 2022. FINDINGS: The heart size and mediastinal contours are within normal limits. Minimal bibasilar subsegmental atelectasis or scarring is noted. Hyperexpansion of the lungs is noted. The visualized skeletal structures are unremarkable. IMPRESSION: Minimal bibasilar subsegmental atelectasis or scarring. Electronically Signed   By: Lupita Raider M.D.   On: 05/28/2023 13:27           LOS: 0 days   Time spent= 35 mins    Keyaan Lederman Joline Maxcy, MD [facility]  If 7PM-7AM, please contact night-coverage  05/29/2023, 7:14 AM

## 2023-05-29 NOTE — Evaluation (Signed)
Occupational Therapy Evaluation Patient Details Name: [name] MRN: [id number] DOB: 17-Oct-1961 Today's Date: 05/29/2023   History of Present Illness 62 y.o. male admitted 7/15 with CAP. PMH:  COPD, DM, pancreatitis, HTN, hyperlipidemia, ETOH abuse   Clinical Impression   PT admitted with CAP. Pt currently with functional limitiations due to the deficits listed below (see OT problem list). Pt lives with elderly mother that required volunteers to push out of room to entrance this session. Pt has required mother (A) recently and living with mother. Pt easily agitated and needs calming strategies to continue session. Pt incontinence of bowel / bladder and no awareness.  Pt will benefit from skilled OT to increase their independence and safety with adls and balance to allow discharge skilled inpatient follow up therapy, <3 hours/day. Pt will need to be at a supervision level to d/c home with mother.       Recommendations for follow up therapy are one component of a multi-disciplinary discharge planning process, led by the attending physician.  Recommendations may be updated based on patient status, additional functional criteria and insurance authorization.   Assistance Recommended at Discharge Set up Supervision/Assistance  Patient can return home with the following A little help with walking and/or transfers;A little help with bathing/dressing/bathroom    Functional Status Assessment  Patient has had a recent decline in their functional status and demonstrates the ability to make significant improvements in function in a reasonable and predictable amount of time.  Equipment Recommendations  Other (comment) (RW)    Recommendations for Other Services Speech consult     Precautions / Restrictions Precautions Precautions: Other (comment) Precaution Comments: watch vitals Restrictions Weight Bearing Restrictions: No      Mobility Bed Mobility Overal bed mobility: Modified  Independent             General bed mobility comments: increased time    Transfers Overall transfer level: Needs assistance Equipment used: None Transfers: Sit to/from Stand Sit to Stand: Min assist           General transfer comment: increased time reaching for therapist and states "get me up"      Balance Overall balance assessment: Needs assistance Sitting-balance support: No upper extremity supported, Feet supported Sitting balance-Leahy Scale: Good     Standing balance support: No upper extremity supported Standing balance-Leahy Scale: Fair Standing balance comment: requires external support of therapist                           ADL either performed or assessed with clinical judgement   ADL Overall ADL's : Needs assistance/impaired Eating/Feeding: Set up   Grooming: Set up   Upper Body Bathing: Moderate assistance   Lower Body Bathing: Moderate assistance   Upper Body Dressing : Moderate assistance   Lower Body Dressing: Moderate assistance   Toilet Transfer: Minimal assistance             General ADL Comments: pt eating lunch from side tray on arrival in laying position with 02 off. pt educated on oxygen with visual of monitor present. Pt after education became more agitated and increased tone. pt given quiet and longer pause between commands and return to appropriate responses.     Vision Baseline Vision/History: 0 No visual deficits Ability to See in Adequate Light: 0 Adequate Patient Visual Report: No change from baseline       Perception     Praxis      Pertinent  Vitals/Pain Pain Assessment Pain Assessment: No/denies pain     Hand Dominance Right   Extremity/Trunk Assessment Upper Extremity Assessment Upper Extremity Assessment: Generalized weakness   Lower Extremity Assessment Lower Extremity Assessment: Defer to PT evaluation   Cervical / Trunk Assessment Cervical / Trunk Assessment: Kyphotic   Communication  Communication Communication: No difficulties   Cognition Arousal/Alertness: Awake/alert Behavior During Therapy: Agitated (mildly) Overall Cognitive Status: Impaired/Different from baseline Area of Impairment: Orientation, Memory                 Orientation Level: Disoriented to, Situation   Memory: Decreased short-term memory         General Comments: pt easily agitated with multiple step command. pt needs 1 step command with pauses to allow for pt to process command     General Comments  on arrival on RA with saturation 85% with pt denying any SOB. pt provided 2 L O2 and unable to rebound past 86%. pt with >1 minute 3L 02 saturations 91 supine and 99% sitting    Exercises     Shoulder Instructions      Home Living Family/patient expects to be discharged to:: Private residence Living Arrangements: Parent Available Help at Discharge: Family;Available 24 hours/day Type of Home: House Home Access: Stairs to enter [employer] of Steps: 1   Home Layout: One level     Bathroom Shower/Tub: Producer, television/film/video: Handicapped height     Home Equipment: None   Additional Comments: moved in with his mother several months ago. She is in good health and able to provide assist as needed.      Prior Functioning/Environment Prior Level of Function : Needs assist             Mobility Comments: primarily household mobility only, independent without AD ADLs Comments: Pt's mother performs iADLs        OT Problem List: Impaired balance (sitting and/or standing);Decreased cognition;Decreased safety awareness;Decreased knowledge of use of DME or AE;Decreased knowledge of precautions;Cardiopulmonary status limiting activity      OT Treatment/Interventions: Self-care/ADL training;Therapeutic exercise;Energy conservation;DME and/or AE instruction;Therapeutic activities;Cognitive remediation/compensation;Patient/family education;Balance training     OT Goals(Current goals can be found in the care plan section) Acute Rehab OT Goals Patient Stated Goal: none stated by patient. OT Goal Formulation: Patient unable to participate in goal setting Time For Goal Achievement: 06/12/23 Potential to Achieve Goals: Good  OT Frequency: Min 2X/week    Co-evaluation              AM-PAC OT "6 Clicks" Daily Activity     Outcome Measure Help from another person eating meals?: A Little Help from another person taking care of personal grooming?: A Little Help from another person toileting, which includes using toliet, bedpan, or urinal?: A Lot Help from another person bathing (including washing, rinsing, drying)?: A Lot Help from another person to put on and taking off regular upper body clothing?: A Little Help from another person to put on and taking off regular lower body clothing?: A Lot 6 Click Score: 15   End of Session Equipment Utilized During Treatment: Oxygen Nurse Communication: Mobility status;Precautions  Activity Tolerance: Patient tolerated treatment well Patient left: in bed;with call bell/phone within reach;with bed alarm set  OT Visit Diagnosis: Unsteadiness on feet (R26.81);Muscle weakness (generalized) (M62.81)                Time: 4098-1191 OT Time Calculation (min): 20 min Charges:  OT General Charges $OT  Visit: 1 Visit OT Evaluation $OT Eval Moderate Complexity: 1 Mod   Brynn, OTR/L  Acute Rehabilitation Services Office: [phone] .   Mateo Flow 05/29/2023, 4:50 PM

## 2023-05-30 DIAGNOSIS — J189 Pneumonia, unspecified organism: Secondary | ICD-10-CM | POA: Diagnosis not present

## 2023-05-30 LAB — CK: Total CK: 46 U/L — ABNORMAL LOW (ref 49–397)

## 2023-05-30 LAB — BASIC METABOLIC PANEL
Anion gap: 5 (ref 5–15)
BUN: 5 mg/dL — ABNORMAL LOW (ref 8–23)
CO2: 33 mmol/L — ABNORMAL HIGH (ref 22–32)
Calcium: 7.9 mg/dL — ABNORMAL LOW (ref 8.9–10.3)
Chloride: 94 mmol/L — ABNORMAL LOW (ref 98–111)
Creatinine, Ser: 0.4 mg/dL — ABNORMAL LOW (ref 0.61–1.24)
GFR, Estimated: >60 mL/min (ref >60)
Glucose, Bld: 233 mg/dL — ABNORMAL HIGH (ref 70–99)
Potassium: 3.9 mmol/L (ref 3.5–5.1)
Sodium: 132 mmol/L — ABNORMAL LOW (ref 135–145)

## 2023-05-30 LAB — GLUCOSE, CAPILLARY
Glucose-Capillary: 248 mg/dL — ABNORMAL HIGH (ref 70–99)
Glucose-Capillary: 408 mg/dL — ABNORMAL HIGH (ref 70–99)
Glucose-Capillary: 411 mg/dL — ABNORMAL HIGH (ref 70–99)
Glucose-Capillary: 390 mg/dL — ABNORMAL HIGH (ref 70–99)
Glucose-Capillary: 432 mg/dL — ABNORMAL HIGH (ref 70–99)

## 2023-05-30 LAB — CBC
HCT: 38.1 % — ABNORMAL LOW (ref 39.0–52.0)
Hemoglobin: 12.2 g/dL — ABNORMAL LOW (ref 13.0–17.0)
MCH: 31.5 pg (ref 26.0–34.0)
MCHC: 32 g/dL (ref 30.0–36.0)
MCV: 98.4 fL (ref 80.0–100.0)
Platelets: 153 10*3/uL (ref 150–400)
RBC: 3.87 MIL/uL — ABNORMAL LOW (ref 4.22–5.81)
RDW: 15.4 % (ref 11.5–15.5)
WBC: 5.7 10*3/uL (ref 4.0–10.5)
nRBC: 0 % (ref 0.0–0.2)

## 2023-05-30 LAB — MAGNESIUM: Magnesium: 1.3 mg/dL — ABNORMAL LOW (ref 1.7–2.4)

## 2023-05-30 LAB — VITAMIN B1: Vitamin B1 (Thiamine): 105.5 nmol/L (ref 66.5–200.0)

## 2023-05-30 LAB — TSH: TSH: 0.302 u[IU]/mL — ABNORMAL LOW (ref 0.350–4.500)

## 2023-05-30 MED ORDER — INSULIN ASPART 100 UNIT/ML IJ SOLN
15.0000 [IU] | Freq: Once | INTRAMUSCULAR | Status: AC
  Administered 2023-05-30: 15 [IU] via SUBCUTANEOUS

## 2023-05-30 MED ORDER — INSULIN ASPART 100 UNIT/ML IJ SOLN: 0.0000 [IU] | Freq: Three times a day (TID) | INTRAMUSCULAR | Status: DC

## 2023-05-30 MED ORDER — ARFORMOTEROL TARTRATE 15 MCG/2ML IN NEBU
15.0000 ug | INHALATION_SOLUTION | Freq: Two times a day (BID) | RESPIRATORY_TRACT | Status: DC
  Administered 2023-05-31 – 2023-06-01 (×3): 15 ug via RESPIRATORY_TRACT
  Filled 2023-05-30 (×4): qty 2

## 2023-05-30 MED ORDER — INSULIN ASPART 100 UNIT/ML IJ SOLN
12.0000 [IU] | Freq: Once | INTRAMUSCULAR | Status: AC
  Administered 2023-05-30: 12 [IU] via SUBCUTANEOUS

## 2023-05-30 MED ORDER — INSULIN GLARGINE-YFGN 100 UNIT/ML ~~LOC~~ SOLN
6.0000 [IU] | Freq: Every day | SUBCUTANEOUS | Status: DC
  Administered 2023-05-30 – 2023-06-01 (×3): 6 [IU] via SUBCUTANEOUS
  Filled 2023-05-30 (×3): qty 0.06

## 2023-05-30 MED ORDER — REVEFENACIN 175 MCG/3ML IN SOLN
175.0000 ug | Freq: Every day | RESPIRATORY_TRACT | Status: DC
  Administered 2023-05-31 – 2023-06-01 (×2): 175 ug via RESPIRATORY_TRACT
  Filled 2023-05-30 (×2): qty 3

## 2023-05-30 MED ORDER — MAGNESIUM SULFATE 4 GM/100ML IV SOLN
4.0000 g | Freq: Once | INTRAVENOUS | Status: AC
  Administered 2023-05-30: 4 g via INTRAVENOUS
  Filled 2023-05-30: qty 100

## 2023-05-30 MED ORDER — MAGNESIUM OXIDE -MG SUPPLEMENT 400 (240 MG) MG PO TABS
800.0000 mg | ORAL_TABLET | Freq: Once | ORAL | Status: AC
  Administered 2023-05-30: 800 mg via ORAL
  Filled 2023-05-30: qty 2

## 2023-05-30 MED ORDER — METHYLPREDNISOLONE SODIUM SUCC 40 MG IJ SOLR
40.0000 mg | Freq: Every day | INTRAMUSCULAR | Status: DC
  Administered 2023-05-31 – 2023-06-01 (×2): 40 mg via INTRAVENOUS
  Filled 2023-05-30 (×2): qty 1

## 2023-05-30 NOTE — Progress Notes (Signed)
SATURATION QUALIFICATIONS: (This note is used to comply with regulatory documentation for home oxygen)  Patient Saturations on Room Air at Rest = 90%  Patient Saturations on Room Air while Ambulating = 86%  Patient Saturations on 2 Liters of oxygen while Ambulating =  92%  Please briefly explain why patient needs home oxygen: Patient unable to  maintain adequate oxygenation  on room air

## 2023-05-30 NOTE — Progress Notes (Signed)
Mobility Specialist Progress Note   05/30/23 1613  Mobility  Activity Ambulated with assistance in hallway  Level of Assistance Minimal assist, patient does 75% or more  Assistive Device Other (Comment) (IV pole)  Distance Ambulated (ft) 220 ft  Activity Response Tolerated well  Mobility Referral Yes  $Mobility charge 1 Mobility  Mobility Specialist Start Time (ACUTE ONLY) 1520  Mobility Specialist Stop Time (ACUTE ONLY) 1600  Mobility Specialist Time Calculation (min) (ACUTE ONLY) 40 min   Pre Mobility: 85% SpO2 on RA During Mobility: 94% SpO2 on 2LO2 Post Mobility: 95% SpO2 on RA  Received pt in bed on RA sitting at 85% SpO2 but having no complaints. Placed on 2L of intermittent O2 per advisement or RN. Pt then rose to 94% SpO2. MinA to stand from EOB, minG used during hallway ambulation d/t slight unsteadiness. Returned back to room and placed in BR w/o fault. Successful BM. Returned to bed w/ call bell in reach and bed alarm on. Kept pt on 2LO2 per advisement of RN.   Frederico Hamman Mobility Specialist Please contact via SecureChat or  Rehab office at [phone]

## 2023-05-30 NOTE — TOC Initial Note (Signed)
Transition of Care [facility]) - Initial/Assessment Note    Patient Details  Name: [name] MRN: [id number] Date of Birth: 09-30-1961  [facility] CM/SW Contact:    Kermit Balo, RN Phone Number: 05/30/2023, 2:44 PM  Clinical Narrative:                 Pt is from home alone but plans to go to his mothers home until he is well enough to return home.  Mom can provide needed transportation. Pt was only taking his metformin prior to admission and does state he forgets at times. CM asked about things to do to help assist in not forgetting his meds.  CM inquired about a CBG meter at home. He states he has one but doesn't use it. CM encouraged him to use it to check his sugars. He became loud and angry stating that if he doesn't want to check it he's not going to and to drop it.  Pt states he has oxygen concentrator and tanks at home but hasn't used them in years. He doesn't know which company provided them. CM has asked [employer] to see if they were the company to provide the DME. TOC following to see if pt is going to require oxygen at d/c.  Mom will transport home when medically ready.  Expected Discharge Plan: Home/Self Care Barriers to Discharge: Inadequate or no insurance, Continued Medical Work up   Patient Goals and CMS Choice            Expected Discharge Plan and Services   Discharge Planning Services: CM Consult   Living arrangements for the past 2 months: Single Family Home                                      Prior Living Arrangements/Services Living arrangements for the past 2 months: Single Family Home Lives with:: Self Patient language and need for interpreter reviewed:: Yes        Need for Family Participation in Patient Care: Yes (Comment) Care giver support system in place?: Yes (comment) Current home services: DME (has an oxygen concentrator and oxygen tanks at home) Criminal Activity/Legal Involvement Pertinent to Current  Situation/Hospitalization: No - Comment as needed  Activities of Daily Living Home Assistive Devices/Equipment: None ADL Screening (condition at time of admission) Patient's cognitive ability adequate to safely complete daily activities?: Yes Is the patient deaf or have difficulty hearing?: No Does the patient have difficulty seeing, even when wearing glasses/contacts?: No Does the patient have difficulty concentrating, remembering, or making decisions?: No Patient able to express need for assistance with ADLs?: Yes Does the patient have difficulty dressing or bathing?: No Independently performs ADLs?: Yes (appropriate for developmental age) Does the patient have difficulty walking or climbing stairs?: No Weakness of Legs: None Weakness of Arms/Hands: None  Permission Sought/Granted                  Emotional Assessment Appearance:: Appears older than stated age Attitude/Demeanor/Rapport: Hostile Affect (typically observed): Defensive Orientation: : Oriented to Self, Oriented to  Time, Oriented to Situation, Oriented to Place   Psych Involvement: No (comment)  Admission diagnosis:  Hypoxia [R09.02] CAP (community acquired pneumonia) [J18.9] Generalized weakness [R53.1] Community acquired pneumonia of right lung, unspecified part of lung [J18.9] Acute bronchitis [J20.9] Patient Active Problem List   Diagnosis Date Noted   Acute bronchitis 05/29/2023   CAP (  community acquired pneumonia) 05/28/2023   Very severe alcohol intoxication with complication (HCC) 07/11/2022   Acute on chronic respiratory failure with hypoxia and hypercapnia (HCC) 07/11/2022   Acute encephalopathy 07/11/2022   Type 2 diabetes mellitus with hyperglycemia (HCC) 02/07/2022   COVID-19 vaccine series completed 12/09/2020   Influenza vaccination declined 12/09/2020   Drinking binge 12/09/2020   Hypertension 11/04/2013   Blurry vision, bilateral 11/04/2013   Pancreatitis 02/09/2012   Alcohol abuse  02/09/2012   TOBACCO ABUSE 10/19/2010   COPD (chronic obstructive pulmonary disease) (HCC) 10/19/2010   PANCREATITIS, ACUTE 10/19/2010   PCP:  Marcine Matar, MD Pharmacy:   [facility] [address], [address] [place] [address] Phone: [phone] Fax: [phone]     Social Determinants of Health (SDOH) Social History: SDOH Screenings   Food Insecurity: No Food Insecurity (05/28/2023)  Housing: Patient Declined (05/28/2023)  Transportation Needs: No Transportation Needs (05/28/2023)  Utilities: Not At Risk (05/28/2023)  Depression (PHQ2-9): Low Risk  (02/07/2022)  Tobacco Use: High Risk (05/28/2023)   SDOH Interventions:     Readmission Risk Interventions     No data to display

## 2023-05-30 NOTE — Inpatient Diabetes Management (Addendum)
Inpatient Diabetes Program Recommendations  AACE/ADA: New Consensus Statement on Inpatient Glycemic Control (2015)  Target Ranges:  Prepandial:   less than 140 mg/dL      Peak postprandial:   less than 180 mg/dL (1-2 hours)      Critically ill patients:  140 - 180 mg/dL   Lab Results  Component Value Date   GLUCAP 248 (H) 05/30/2023   HGBA1C 7.4 (H) 05/28/2023    Review of Glycemic Control  Latest Reference Range & Units 05/29/23 06:18 05/29/23 12:55 05/29/23 16:40 05/29/23 21:35 05/30/23 06:11 05/30/23 13:00  Glucose-Capillary 70 - 99 mg/dL 161 (H) 096 (H) 045 (H) 385 (H) 248 (H) 408 (H)  Diabetes history: DM 2 Outpatient Diabetes medications:  None (no longer taking Metformin) Current orders for Inpatient glycemic control:  Novolog 0-9 units tid with meals Solumedrol 40 mg IV daily   Inpatient Diabetes Program Recommendations:    Consider adding Semglee 6 units daily and Novolog 2 units tid with meals (hold if patient eats less than 50% or NPO).   Thanks,  Beryl Meager, RN, BC-ADM Inpatient Diabetes Coordinator Pager [phone]  (8a-5p)

## 2023-05-30 NOTE — Progress Notes (Signed)
PROGRESS NOTE    [name]  WUJ:[id number] DOB: 1961-02-20 DOA: 05/28/2023 PCP: Marcine Matar, MD   Brief Narrative:  62 year old with history of COPD, diabetes mellitus type 2, pancreatitis, HTN, HLD, alcohol abuse presents to the hospital with generalized lethargy and weakness ongoing for past few weeks and having difficulty completing simple tasks.  He has also had weight loss during this time.  Chest x-ray shows bibasilar atelectasis.  CT chest concerning for peripheral groundglass opacity especially on the right lower base with mucous plugging concerning for bronchopneumonia.  Patient was diagnosed with community-acquired pneumonia and admitted on IV Rocephin and azithromycin.  Eventually Rocephin was discontinued due to negative procalcitonin.   Assessment & Plan:   Acute bronchitis/COPD exacerbation Active tobacco use - Procalcitonin negative, Rocephin stopped.  Continue azithromycin.  RVP negative. - Solu-Medrol, bronchodilators scheduled and as needed.  I-S/flutter valve.  Generalized weakness - Check CK and TSH - B12, folate are normal.  C -PT/OT-no follow-up needed   Type 2 diabetes -A1c 7.4.  Holding metformin - Sliding scale and Accu-Cheks     Hx of ETOH use disorder Used to drink 8-10 beers a day for several years. Last drink was a few months ago. -Continue to monitor   Hyperlipidemia Lipid panel appears to be stable.  Continue outpatient Lipitor   Hyponatremia stable   Hypomagnesemia/hypocalcemia Replete as needed   Right middle lobe nodule Incidental finding on CT chest, nodule measuring up to 1.4 cm.  -Repeat CT chest recommended 4 weeks.    DVT prophylaxis: Lovenox Code Status: Full code Family Communication:   Still has significant abnormal breath sounds.  Continue hospital stay.       Diet Orders (From admission, onward)     Start     Ordered   05/28/23 1900  Diet regular Room service appropriate? Yes; Fluid consistency: Thin   Diet effective now       Question Answer Comment  Room service appropriate? Yes   Fluid consistency: Thin      05/28/23 1901            Subjective: SOB improved.    Examination:  Constitutional: Not in acute distress; 2lNC Respiratory: mild rhonchi at the bases.  Cardiovascular: Normal sinus rhythm, no rubs Abdomen: Nontender nondistended good bowel sounds Musculoskeletal: No edema noted Skin: No rashes seen Neurologic: CN 2-12 grossly intact.  And nonfocal Psychiatric: Normal judgment and insight. Alert and oriented x 3. Normal mood.    Objective: Vitals:   05/29/23 1948 05/29/23 2336 05/30/23 0411 05/30/23 0725  BP: 115/76 97/81 128/88   Pulse: (!) 107 99 90   Resp: 20 20 19    Temp: 98 F (36.7 C) 97.6 F (36.4 C) 97.8 F (36.6 C)   TempSrc: Oral Oral Oral   SpO2: 92% 98% 96% 96%  Weight:      Height:        Intake/Output Summary (Last 24 hours) at 05/30/2023 0808 Last data filed at 05/30/2023 0600 Gross per 24 hour  Intake 1784.66 ml  Output 1350 ml  Net 434.66 ml   Filed Weights   05/29/23 0500  Weight: 53.8 kg    Scheduled Meds:  budesonide (PULMICORT) nebulizer solution  0.5 mg Nebulization BID   enoxaparin (LOVENOX) injection  40 mg Subcutaneous Q24H   guaiFENesin  1,200 mg Oral BID   insulin aspart  0-9 Units Subcutaneous TID WC   magnesium oxide  800 mg Oral Once   methylPREDNISolone (SOLU-MEDROL) injection  40 mg  Intravenous Q12H   nicotine  21 mg Transdermal Daily   Continuous Infusions:  sodium chloride Stopped (05/28/23 1748)   sodium chloride 65 mL/hr at 05/30/23 0423   azithromycin 500 mg (05/29/23 1738)   magnesium sulfate bolus IVPB      Nutritional status     Body mass index is 15.86 kg/m.  Data Reviewed:   CBC: Recent Labs  Lab 05/28/23 1110 05/28/23 1933 05/29/23 0305 05/30/23 0340  WBC 9.8 6.5 6.4 5.7  NEUTROABS 8.1*  --   --   --   HGB 14.5 12.4* 12.5* 12.2*  HCT 43.3 38.7* 39.4 38.1*  MCV 95.2 96.0 97.5  98.4  PLT 171 143* 149* 153   Basic Metabolic Panel: Recent Labs  Lab 05/28/23 1110 05/28/23 1933 05/29/23 0305 05/29/23 0731 05/30/23 0340  NA 129*  --  131*  --  132*  K 4.2  --  3.9  --  3.9  CL 87*  --  90*  --  94*  CO2 34*  --  34*  --  33*  GLUCOSE 211*  --  121*  --  233*  BUN 7*  --  <5*  --  5*  CREATININE 0.36* 0.30* 0.33*  --  0.40*  CALCIUM 8.7*  --  7.6*  --  7.9*  MG 1.4*  --   --   --  1.3*  PHOS  --   --   --  2.6  --    GFR: Estimated Creatinine Clearance: 71 mL/min (A) (by C-G formula based on SCr of 0.4 mg/dL (L)). Liver Function Tests: Recent Labs  Lab 05/28/23 1110 05/29/23 0305  AST 20 20  ALT 18 24  ALKPHOS 84 77  BILITOT 1.0 0.1*  PROT 6.2* 5.0*  ALBUMIN 3.8 2.5*   No results for input(s): "LIPASE", "AMYLASE" in the last 168 hours. No results for input(s): "AMMONIA" in the last 168 hours. Coagulation Profile: No results for input(s): "INR", "PROTIME" in the last 168 hours. Cardiac Enzymes: No results for input(s): "CKTOTAL", "CKMB", "CKMBINDEX", "TROPONINI" in the last 168 hours. BNP (last 3 results) No results for input(s): "PROBNP" in the last 8760 hours. HbA1C: Recent Labs    05/28/23 1933  HGBA1C 7.4*   CBG: Recent Labs  Lab 05/29/23 0618 05/29/23 1255 05/29/23 1640 05/29/23 2135 05/30/23 0611  GLUCAP 150* 224* 302* 385* 248*   Lipid Profile: Recent Labs    05/29/23 0305  CHOL 89  HDL 40*  LDLCALC 42  TRIG 34  CHOLHDL 2.2   Thyroid Function Tests: No results for input(s): "TSH", "T4TOTAL", "FREET4", "T3FREE", "THYROIDAB" in the last 72 hours. Anemia Panel: Recent Labs    05/28/23 1110  VITAMINB12 486  FOLATE 15.2   Sepsis Labs: Recent Labs  Lab 05/29/23 0731  PROCALCITON <0.10    Recent Results (from the past 240 hour(s))  Resp panel by RT-PCR (RSV, Flu A&B, Covid) Anterior Nasal Swab     Status: None   Collection Time: 05/28/23  2:47 PM   Specimen: Anterior Nasal Swab  Result Value Ref Range Status    SARS Coronavirus 2 by RT PCR NEGATIVE NEGATIVE Final    Comment: (NOTE) SARS-CoV-2 target nucleic acids are NOT DETECTED.  The SARS-CoV-2 RNA is generally detectable in upper respiratory specimens during the acute phase of infection. The lowest concentration of SARS-CoV-2 viral copies this assay can detect is 138 copies/mL. A negative result does not preclude SARS-Cov-2 infection and should not be used as the sole basis  for treatment or other patient management decisions. A negative result may occur with  improper specimen collection/handling, submission of specimen other than nasopharyngeal swab, presence of viral mutation(s) within the areas targeted by this assay, and inadequate number of viral copies(<138 copies/mL). A negative result must be combined with clinical observations, patient history, and epidemiological information. The expected result is Negative.  Fact Sheet for Patients:  BloggerCourse.com  Fact Sheet for Healthcare Providers:  SeriousBroker.it  This test is no t yet approved or cleared by the [place] FDA and  has been authorized for detection and/or diagnosis of SARS-CoV-2 by FDA under an Emergency Use Authorization (EUA). This EUA will remain  in effect (meaning this test can be used) for the duration of the COVID-19 declaration under Section 564(b)(1) of the Act, 21 U.S.C.section 360bbb-3(b)(1), unless the authorization is terminated  or revoked sooner.       Influenza A by PCR NEGATIVE NEGATIVE Final   Influenza B by PCR NEGATIVE NEGATIVE Final    Comment: (NOTE) The Xpert Xpress SARS-CoV-2/FLU/RSV plus assay is intended as an aid in the diagnosis of influenza from Nasopharyngeal swab specimens and should not be used as a sole basis for treatment. Nasal washings and aspirates are unacceptable for Xpert Xpress SARS-CoV-2/FLU/RSV testing.  Fact Sheet for  Patients: BloggerCourse.com  Fact Sheet for Healthcare Providers: SeriousBroker.it  This test is not yet approved or cleared by the [place] FDA and has been authorized for detection and/or diagnosis of SARS-CoV-2 by FDA under an Emergency Use Authorization (EUA). This EUA will remain in effect (meaning this test can be used) for the duration of the COVID-19 declaration under Section 564(b)(1) of the Act, 21 U.S.C. section 360bbb-3(b)(1), unless the authorization is terminated or revoked.     Resp Syncytial Virus by PCR NEGATIVE NEGATIVE Final    Comment: (NOTE) Fact Sheet for Patients: BloggerCourse.com  Fact Sheet for Healthcare Providers: SeriousBroker.it  This test is not yet approved or cleared by the [place] FDA and has been authorized for detection and/or diagnosis of SARS-CoV-2 by FDA under an Emergency Use Authorization (EUA). This EUA will remain in effect (meaning this test can be used) for the duration of the COVID-19 declaration under Section 564(b)(1) of the Act, 21 U.S.C. section 360bbb-3(b)(1), unless the authorization is terminated or revoked.  Performed at [facility], [address], [place], [place] [address]   Respiratory (~20 pathogens) panel by PCR     Status: None   Collection Time: 05/29/23  7:21 AM   Specimen: Nasopharyngeal Swab; Respiratory  Result Value Ref Range Status   Adenovirus NOT DETECTED NOT DETECTED Final   Coronavirus 229E NOT DETECTED NOT DETECTED Final    Comment: (NOTE) The Coronavirus on the Respiratory Panel, DOES NOT test for the novel  Coronavirus (2019 nCoV)    Coronavirus HKU1 NOT DETECTED NOT DETECTED Final   Coronavirus NL63 NOT DETECTED NOT DETECTED Final   Coronavirus OC43 NOT DETECTED NOT DETECTED Final   Metapneumovirus NOT DETECTED NOT DETECTED Final   Rhinovirus / Enterovirus NOT  DETECTED NOT DETECTED Final   Influenza A NOT DETECTED NOT DETECTED Final   Influenza B NOT DETECTED NOT DETECTED Final   Parainfluenza Virus 1 NOT DETECTED NOT DETECTED Final   Parainfluenza Virus 2 NOT DETECTED NOT DETECTED Final   Parainfluenza Virus 3 NOT DETECTED NOT DETECTED Final   Parainfluenza Virus 4 NOT DETECTED NOT DETECTED Final   Respiratory Syncytial Virus NOT DETECTED NOT DETECTED Final   Bordetella pertussis  NOT DETECTED NOT DETECTED Final   Bordetella Parapertussis NOT DETECTED NOT DETECTED Final   Chlamydophila pneumoniae NOT DETECTED NOT DETECTED Final   Mycoplasma pneumoniae NOT DETECTED NOT DETECTED Final    Comment: Performed at [facility], [address] [place], [place] [address]         Radiology Studies: CT Chest W Contrast  Result Date: 05/28/2023 CLINICAL DATA:  Respiratory illness, nondiagnostic xray EXAM: CT CHEST WITH CONTRAST TECHNIQUE: Multidetector CT imaging of the chest was performed during intravenous contrast administration. RADIATION DOSE REDUCTION: This exam was performed according to the departmental dose-optimization program which includes automated exposure control, adjustment of the mA and/or kV according to patient size and/or use of iterative reconstruction technique. CONTRAST:  60mL OMNIPAQUE IOHEXOL 300 MG/ML  SOLN COMPARISON:  Today.  CT 09/15/2010 FINDINGS: Cardiovascular: Heart is normal size. Aorta is normal caliber. Diffusely calcified coronary arteries. Scattered aortic calcifications. No aneurysm or dissection. Mediastinum/Nodes: No mediastinal, hilar, or axillary adenopathy. Trachea and esophagus are unremarkable. Thyroid unremarkable. Lungs/Pleura: Moderate centrilobular emphysema. Peripheral right rib lower lobe interstitial thickening and ground-glass opacities. Nodular density in the right middle lobe peripherally measures 1.4 cm. Nodular density in the right middle lobe on image 104 measures 6 mm. There is mucous plugging  within the right lower lobe airways. Minimal left basilar opacity, likely atelectasis. No effusions. Upper Abdomen: No acute findings Musculoskeletal: Chest wall soft tissues are unremarkable. No acute bony abnormality. IMPRESSION: Peripheral ground-glass opacities and interstitial thickening in the right middle lobe and right lower lobe. Mucous plugging within the lower lobe airways. Areas of nodularity in the right middle lobe measuring up to 1.4 cm. Legrand Rams this reflects an infectious or inflammatory process, possibly bronchopneumonia. Followup chest CT is recommended in 3-4 weeks following trial of antibiotic therapy to ensure resolution and exclude underlying malignancy. Diffuse coronary artery disease. Aortic Atherosclerosis (ICD10-I70.0) and Emphysema (ICD10-J43.9). Electronically Signed   By: Charlett Nose M.D.   On: 05/28/2023 15:02   DG Chest 1 View  Result Date: 05/28/2023 CLINICAL DATA:  Weakness. EXAM: CHEST  1 VIEW COMPARISON:  July 11, 2022. FINDINGS: The heart size and mediastinal contours are within normal limits. Minimal bibasilar subsegmental atelectasis or scarring is noted. Hyperexpansion of the lungs is noted. The visualized skeletal structures are unremarkable. IMPRESSION: Minimal bibasilar subsegmental atelectasis or scarring. Electronically Signed   By: Lupita Raider M.D.   On: 05/28/2023 13:27           LOS: 1 day   Time spent= 35 mins    Boleslaus Holloway Joline Maxcy, MD [facility]  If 7PM-7AM, please contact night-coverage  05/30/2023, 8:08 AM

## 2023-05-30 NOTE — Progress Notes (Addendum)
Physical Therapy Treatment Patient Details Name: [name] MRN: [id number] DOB: 1961-02-26 Today's Date: 05/30/2023   History of Present Illness 62 y.o. male admitted 7/15 with CAP. PMH:  COPD, DM, pancreatitis, HTN, hyperlipidemia, ETOH abuse    PT Comments  Pt pleasant and cooperative today. In recliner on arrival on RA, SpO2 89%. Mobilized on RA with desat to 81%. Placed on 2L via [place] upon return to room for recovery to 90%. He required min assist transfers, and min guard assist amb 120' with IV pole support in L hand. Steady gait noted. Pt in bed on 2L at end of session. Pt reports he does have O2 concentrator and portable O2 tanks at home as he has needed them in the past. He reports he was not using home O2 at time of admission.       Assistance Recommended at Discharge PRN  If plan is discharge home, recommend the following:  Can travel by private vehicle    Assistance with cooking/housework;Assist for transportation;Help with stairs or ramp for entrance      Equipment Recommendations  None recommended by PT    Recommendations for Other Services       Precautions / Restrictions Precautions Precautions: Other (comment) Precaution Comments: watch vitals     Mobility  Bed Mobility Overal bed mobility: Modified Independent             General bed mobility comments: increased time    Transfers Overall transfer level: Needs assistance Equipment used: None Transfers: Sit to/from Stand Sit to Stand: Min assist           General transfer comment: assist to power up to full stance, increased time    Ambulation/Gait Ambulation/Gait assistance: Min guard Gait Distance (Feet): 120 Feet Assistive device: IV Pole Gait Pattern/deviations: Step-through pattern, Decreased stride length Gait velocity: decreased     General Gait Details: Steady gait with IV pole support in L hand. Desat to 81% on RA. Cues for pursed lip breathing.   Stairs              Wheelchair Mobility     Tilt Bed    Modified Rankin (Stroke Patients Only)       Balance Overall balance assessment: Needs assistance Sitting-balance support: No upper extremity supported, Feet supported Sitting balance-Leahy Scale: Good     Standing balance support: Single extremity supported, No upper extremity supported, During functional activity Standing balance-Leahy Scale: Fair                              Cognition Arousal/Alertness: Awake/alert Behavior During Therapy: WFL for tasks assessed/performed Overall Cognitive Status: Impaired/Different from baseline Area of Impairment: Awareness                           Awareness: Emergent   General Comments: Pt pleasant and cooperative today. Decreased awareness of medical status.        Exercises      General Comments General comments (skin integrity, edema, etc.): SpO2 89% on RA at rest on arrival. Desat to 81% ambulating on RA. 2L O2 via [place] upon return to room for recovery to 90%. Pt instructed on use of inspirometer and pursed lip breathing.      Pertinent Vitals/Pain Pain Assessment Pain Assessment: No/denies pain    Home Living  Prior Function            PT Goals (current goals can now be found in the care plan section) Acute Rehab PT Goals Patient Stated Goal: home Progress towards PT goals: Progressing toward goals    Frequency    Min 3X/week      PT Plan Current plan remains appropriate    Co-evaluation              AM-PAC PT "6 Clicks" Mobility   Outcome Measure  Help needed turning from your back to your side while in a flat bed without using bedrails?: None Help needed moving from lying on your back to sitting on the side of a flat bed without using bedrails?: None Help needed moving to and from a bed to a chair (including a wheelchair)?: A Little Help needed standing up from a chair using your arms (e.g.,  wheelchair or bedside chair)?: A Little Help needed to walk in hospital room?: A Little Help needed climbing 3-5 steps with a railing? : A Little 6 Click Score: 20    End of Session Equipment Utilized During Treatment: Oxygen;Gait belt Activity Tolerance: Patient tolerated treatment well Patient left: in bed;with call bell/phone within reach Nurse Communication: Mobility status;Other (comment) (placed pt on 2L O2) PT Visit Diagnosis: Muscle weakness (generalized) (M62.81);Difficulty in walking, not elsewhere classified (R26.2)     Time: 1610-9604 PT Time Calculation (min) (ACUTE ONLY): 17 min  Charges:    $Gait Training: 8-22 mins PT General Charges $$ ACUTE PT VISIT: 1 Visit                     Ferd Glassing., PT  Office # [phone]    Ilda Foil 05/30/2023, 1:28 PM

## 2023-05-31 DIAGNOSIS — J441 Chronic obstructive pulmonary disease with (acute) exacerbation: Secondary | ICD-10-CM

## 2023-05-31 LAB — CBC
HCT: 38.4 % — ABNORMAL LOW (ref 39.0–52.0)
Hemoglobin: 12.2 g/dL — ABNORMAL LOW (ref 13.0–17.0)
MCH: 30.9 pg (ref 26.0–34.0)
MCHC: 31.8 g/dL (ref 30.0–36.0)
MCV: 97.2 fL (ref 80.0–100.0)
Platelets: 169 10*3/uL (ref 150–400)
RBC: 3.95 MIL/uL — ABNORMAL LOW (ref 4.22–5.81)
RDW: 15.6 % — ABNORMAL HIGH (ref 11.5–15.5)
WBC: 16.2 10*3/uL — ABNORMAL HIGH (ref 4.0–10.5)
nRBC: 0 % (ref 0.0–0.2)

## 2023-05-31 LAB — BASIC METABOLIC PANEL
Anion gap: 5 (ref 5–15)
BUN: 8 mg/dL (ref 8–23)
CO2: 34 mmol/L — ABNORMAL HIGH (ref 22–32)
Calcium: 8 mg/dL — ABNORMAL LOW (ref 8.9–10.3)
Chloride: 93 mmol/L — ABNORMAL LOW (ref 98–111)
Creatinine, Ser: 0.48 mg/dL — ABNORMAL LOW (ref 0.61–1.24)
GFR, Estimated: >60 mL/min (ref >60)
Glucose, Bld: 174 mg/dL — ABNORMAL HIGH (ref 70–99)
Potassium: 3.5 mmol/L (ref 3.5–5.1)
Sodium: 132 mmol/L — ABNORMAL LOW (ref 135–145)

## 2023-05-31 LAB — GLUCOSE, CAPILLARY
Glucose-Capillary: 62 mg/dL — ABNORMAL LOW (ref 70–99)
Glucose-Capillary: 71 mg/dL (ref 70–99)
Glucose-Capillary: 191 mg/dL — ABNORMAL HIGH (ref 70–99)
Glucose-Capillary: 208 mg/dL — ABNORMAL HIGH (ref 70–99)
Glucose-Capillary: 235 mg/dL — ABNORMAL HIGH (ref 70–99)
Glucose-Capillary: 282 mg/dL — ABNORMAL HIGH (ref 70–99)

## 2023-05-31 LAB — MAGNESIUM: Magnesium: 1.6 mg/dL — ABNORMAL LOW (ref 1.7–2.4)

## 2023-05-31 MED ORDER — MAGNESIUM SULFATE 2 GM/50ML IV SOLN
2.0000 g | Freq: Once | INTRAVENOUS | Status: AC
  Administered 2023-05-31: 2 g via INTRAVENOUS
  Filled 2023-05-31: qty 50

## 2023-05-31 MED ORDER — INSULIN ASPART 100 UNIT/ML IJ SOLN
3.0000 [IU] | Freq: Three times a day (TID) | INTRAMUSCULAR | Status: DC
  Administered 2023-05-31 – 2023-06-01 (×3): 3 [IU] via SUBCUTANEOUS

## 2023-05-31 NOTE — Progress Notes (Signed)
Physical Therapy Treatment Patient Details Name: [name] MRN: [id number] DOB: 1961-05-24 Today's Date: 05/31/2023   History of Present Illness 62 y.o. male admitted 7/15 with CAP. PMH:  COPD, DM, pancreatitis, HTN, hyperlipidemia, ETOH abuse    PT Comments  Pt is continuing with slightly unsteady gait and would benefit from use of AD at discharge. Pt has no AD needs at this time.  Pt O2 sats dropped to 79% on RA with gait and pt was educated on the meaning of O2 saturation and normal numbers within PT scope of practice. Pt will have his mother at home who he states is in good health. Currently no recommended skilled physical therapy services at this time and pt is cleared from a physical therapy perspective once medically stable. Pt will benefit from continued skilled physical therapy services in order to improve safety with mobility at home while in the hospital setting.     Assistance Recommended at Discharge PRN  If plan is discharge home, recommend the following:  Can travel by private vehicle    Assistance with cooking/housework;Assist for transportation;Help with stairs or ramp for entrance      Equipment Recommendations  None recommended by PT    Recommendations for Other Services       Precautions / Restrictions Precautions Precautions: Other (comment) Precaution Comments: watch vitals, O2 drops on RA Restrictions Weight Bearing Restrictions: No     Mobility  Bed Mobility Overal bed mobility: Modified Independent             General bed mobility comments: increased time sat EOB at end of session to eat,  did not lie down. Patient Response: Anxious  Transfers Overall transfer level: Needs assistance Equipment used: None Transfers: Sit to/from Stand Sit to Stand: Supervision           General transfer comment: 2x rocking from EOB, able to stand from recliner with UE support. Pt was asked if his knees were bothering him due to pt kept asking for  help to get up and pt stated "No I am just not use to getting up."    Ambulation/Gait Ambulation/Gait assistance: Min guard Gait Distance (Feet): 250 Feet Assistive device: IV Pole Gait Pattern/deviations: Step-through pattern, Decreased stride length Gait velocity: decreased Gait velocity interpretation: <1.31 ft/sec, indicative of household ambulator   General Gait Details: Steady gait with IV pole support in L hand. Desat to 79% on RA. Cues for pursed lip breathing which slightly improved to 81% O2 on to 1L O2 sat up to 84% and then increased to 2 L with O2 sat at 90%, RN aware     Tilt Bed Tilt Bed Patient Response: Anxious  Modified Rankin (Stroke Patients Only)       Balance Overall balance assessment: Needs assistance Sitting-balance support: No upper extremity supported, Feet supported Sitting balance-Leahy Scale: Good     Standing balance support: Single extremity supported, No upper extremity supported, During functional activity Standing balance-Leahy Scale: Fair Standing balance comment: Benefits from unilateral UE support        Cognition Arousal/Alertness: Awake/alert Behavior During Therapy: WFL for tasks assessed/performed               General Comments General comments (skin integrity, edema, etc.): Walking test done with O2 sat dropping to 79% with walking, on RA at rest pt O2 sat 87%      Pertinent Vitals/Pain Pain Assessment Pain Assessment: No/denies pain     PT Goals (current goals can now be  found in the care plan section) Acute Rehab PT Goals Patient Stated Goal: home PT Goal Formulation: With patient Time For Goal Achievement: 06/12/23 Potential to Achieve Goals: Good Progress towards PT goals: Progressing toward goals    Frequency    Min 3X/week      PT Plan Current plan remains appropriate       AM-PAC PT "6 Clicks" Mobility   Outcome Measure  Help needed turning from your back to your side while in a flat bed  without using bedrails?: None Help needed moving from lying on your back to sitting on the side of a flat bed without using bedrails?: None Help needed moving to and from a bed to a chair (including a wheelchair)?: A Little Help needed standing up from a chair using your arms (e.g., wheelchair or bedside chair)?: A Little Help needed to walk in hospital room?: A Little Help needed climbing 3-5 steps with a railing? : A Little 6 Click Score: 20    End of Session Equipment Utilized During Treatment: Oxygen;Gait belt Activity Tolerance: Patient tolerated treatment well Patient left: in bed;with call bell/phone within reach Nurse Communication: Mobility status;Other (comment) (O2 gait test) PT Visit Diagnosis: Muscle weakness (generalized) (M62.81);Difficulty in walking, not elsewhere classified (R26.2)     Time: 7829-5621 PT Time Calculation (min) (ACUTE ONLY): 38 min  Charges:    $Gait Training: 8-22 mins $Therapeutic Activity: 23-37 mins PT General Charges $$ ACUTE PT VISIT: 1 Visit                     Harrel Carina, DPT, CLT  Acute Rehabilitation Services Office: [phone] (Secure chat preferred)    Claudia Desanctis 05/31/2023, 9:48 AM

## 2023-05-31 NOTE — Progress Notes (Signed)
Hypoglycemic Event  CBG: 62  Treatment: 4 oz juice/soda  Symptoms: None  Follow-up CBG: Time: 0650 CBG Result:71  Possible Reasons for Event: Unknown   Comments/MD notified: Given snack gram crackers and peanut butter , provider notified.     Tracie Harrier

## 2023-05-31 NOTE — Plan of Care (Signed)

## 2023-05-31 NOTE — TOC Progression Note (Signed)
Transition of Care [facility]) - Progression Note    Patient Details  Name: [name] MRN: [id number] Date of Birth: July 27, 1961  [facility] CM/SW Contact  Kermit Balo, RN Phone Number: 05/31/2023, 1:24 PM  Clinical Narrative:    Pt qualifies for home oxygen. CM was unable to find any oxygen company that claims his concentrator and tanks at his home. He is unsure how old they are and they are out in his shed.  Pt is uninsured and can not afford the monthly oxygen cost. CM has asked for LOG. This was granted for 30 days and sent to [employer]. Zack with [employer] will have oxygen delivered to patients room prior to d/c. Concentrator will be delivered to pts mothers home as this is where he will be going at d/c.  CM will talk to [facility] on Tuesday to see if they can assist with oxygen needs after the initial 30 days.  CM has also asked financial counseling to work him up for [employer].  Mother will transport home when medically ready.   Expected Discharge Plan: Home/Self Care Barriers to Discharge: Inadequate or no insurance, Continued Medical Work up  Expected Discharge Plan and Services   Discharge Planning Services: CM Consult   Living arrangements for the past 2 months: Single Family Home                 DME Arranged: Oxygen DME Agency: [employer] Date DME Agency Contacted: 05/31/23   Representative spoke with at DME Agency: Timothy Lasso with [employer]             Social Determinants of Health (SDOH) Interventions SDOH Screenings   Food Insecurity: No Food Insecurity (05/28/2023)  Housing: Patient Declined (05/28/2023)  Transportation Needs: No Transportation Needs (05/28/2023)  Utilities: Not At Risk (05/28/2023)  Depression (PHQ2-9): Low Risk  (02/07/2022)  Tobacco Use: High Risk (05/28/2023)    Readmission Risk Interventions     No data to display

## 2023-05-31 NOTE — Progress Notes (Signed)
Occupational Therapy Treatment Patient Details Name: [name] MRN: [id number] DOB: 1961/07/13 Today's Date: 05/31/2023   History of present illness 62 y.o. male admitted 7/15 with CAP. PMH:  COPD, DM, pancreatitis, HTN, hyperlipidemia, ETOH abuse   OT comments  Pt making incremental progress towards OT goals though becomes easily agitated with questions and attempts at education. Focus on bathroom mobility and O2 mgmt with tubing extension at min guard. Provided energy conservation handout, initial education for ADL modification, obtaining pulse ox for at home monitoring and good O2 levels. Pt declines need for postacute rehab, opting for home at DC.    Recommendations for follow up therapy are one component of a multi-disciplinary discharge planning process, led by the attending physician.  Recommendations may be updated based on patient status, additional functional criteria and insurance authorization.    Assistance Recommended at Discharge Set up Supervision/Assistance  Patient can return home with the following  A little help with bathing/dressing/bathroom;Direct supervision/assist for medications management   Equipment Recommendations   (TBD)    Recommendations for Other Services      Precautions / Restrictions Precautions Precautions: Other (comment) Precaution Comments: monitor O2 (did not wear O2 PTA) Restrictions Weight Bearing Restrictions: No       Mobility Bed Mobility Overal bed mobility: Modified Independent                  Transfers Overall transfer level: Needs assistance Equipment used: None Transfers: Sit to/from Stand Sit to Stand: Supervision                 Balance Overall balance assessment: Needs assistance Sitting-balance support: No upper extremity supported, Feet supported Sitting balance-Leahy Scale: Good     Standing balance support: Single extremity supported, No upper extremity supported, During functional  activity Standing balance-Leahy Scale: Fair                             ADL either performed or assessed with clinical judgement   ADL Overall ADL's : Needs assistance/impaired                         Toilet Transfer: Min guard;Ambulation Toilet Transfer Details (indicate cue type and reason): without AD, elongated O2 tubing to assess if it would reach. cues for O2 mgmt needed for safety           General ADL Comments: Focus on energy conservation education (shower chair, ADL modification), pulse ox monitoring O2, good O2 levels and assist w/ med mgmt at home. Pt easily agitated with education but when OT offered to leave then pt receptive to education    Extremity/Trunk Assessment Upper Extremity Assessment Upper Extremity Assessment: Generalized weakness   Lower Extremity Assessment Lower Extremity Assessment: Defer to PT evaluation        Vision   Vision Assessment?: No apparent visual deficits   Perception     Praxis      Cognition Arousal/Alertness: Awake/alert Behavior During Therapy: WFL for tasks assessed/performed Overall Cognitive Status: Impaired/Different from baseline Area of Impairment: Awareness, Safety/judgement, Memory                     Memory: Decreased short-term memory   Safety/Judgement: Decreased awareness of safety, Decreased awareness of deficits Awareness: Emergent   General Comments: easily agitated, follows directions, decreased insight into deficits, need for O2, etc        Exercises  Shoulder Instructions       General Comments Mother at bedside and preacher entering at end of session    Pertinent Vitals/ Pain       Pain Assessment Pain Assessment: No/denies pain  Home Living                                          Prior Functioning/Environment              Frequency  Min 2X/week        Progress Toward Goals  OT Goals(current goals can now be found in  the care plan section)  Progress towards OT goals: Progressing toward goals  Acute Rehab OT Goals Patient Stated Goal: home soon OT Goal Formulation: Patient unable to participate in goal setting Time For Goal Achievement: 06/12/23 Potential to Achieve Goals: Good ADL Goals Pt Will Perform Lower Body Dressing: with min guard assist;sit to/from stand Pt Will Transfer to Toilet: with min guard assist;ambulating;regular height toilet Pt Will Perform Toileting - Clothing Manipulation and hygiene: with min guard assist;sit to/from stand Additional ADL Goal #1: pt will complete 5 step pathfinding task with written instructions as guide Additional ADL Goal #2: Pt will complete full adl at sink supervision level with 2 compensatory strategies  Plan Discharge plan needs to be updated    Co-evaluation                 AM-PAC OT "6 Clicks" Daily Activity     Outcome Measure   Help from another person eating meals?: A Little Help from another person taking care of personal grooming?: A Little Help from another person toileting, which includes using toliet, bedpan, or urinal?: A Little Help from another person bathing (including washing, rinsing, drying)?: A Lot Help from another person to put on and taking off regular upper body clothing?: A Little Help from another person to put on and taking off regular lower body clothing?: A Little 6 Click Score: 17    End of Session Equipment Utilized During Treatment: Oxygen;Gait belt  OT Visit Diagnosis: Unsteadiness on feet (R26.81);Muscle weakness (generalized) (M62.81)   Activity Tolerance Patient tolerated treatment well   Patient Left in chair;with call bell/phone within reach;with family/visitor present;Other (comment) (declines chair alarm/bed alarm)   Nurse Communication Mobility status        Time: [phone] OT Time Calculation (min): 25 min  Charges: OT General Charges $OT Visit: 1 Visit OT Treatments $Self Care/Home  Management : 23-37 mins  Bradd Canary, OTR/L Acute Rehab Services Office: [phone]   Lorre Munroe 05/31/2023, 2:41 PM

## 2023-05-31 NOTE — Progress Notes (Signed)
PROGRESS NOTE    [name]  ONG:[id number] DOB: July 24, 1961 DOA: 05/28/2023 PCP: Marcine Matar, MD   Brief Narrative:  62 year old with history of COPD, diabetes mellitus type 2, pancreatitis, HTN, HLD, alcohol abuse presents to the hospital with generalized lethargy and weakness ongoing for past few weeks and having difficulty completing simple tasks.  He has also had weight loss during this time.  Chest x-ray shows bibasilar atelectasis.  CT chest concerning for peripheral groundglass opacity especially on the right lower base with mucous plugging concerning for bronchopneumonia.  Patient was diagnosed with community-acquired pneumonia and admitted on IV Rocephin and azithromycin.  Eventually Rocephin was discontinued due to negative procalcitonin.  Still hypoxic with ambulation   Assessment & Plan:   Acute bronchitis/COPD exacerbation Active tobacco use -Ambulatory pulse ox dropped to 79% on room air, continue hospital stay.  For now I will go ahead and order 2 L nasal cannula for home but hopefully will be able to wean this off - Procalcitonin negative, Rocephin stopped.  RVP negative.  Finish 5-day azithromycin course - Solu-Medrol, bronchodilators scheduled and as needed.  I-S/flutter valve.  Generalized weakness - Check CK and TSH - B12, folate are normal.  C -PT/OT-no follow-up needed   Type 2 diabetes -A1c 7.4.  Holding metformin - Sliding scale and Accu-Cheks     Hx of ETOH use disorder Used to drink 8-10 beers a day for several years. Last drink was a few months ago. -Continue to monitor   Hyperlipidemia Lipid panel appears to be stable.  Continue outpatient Lipitor   Hyponatremia stable   Hypomagnesemia/hypocalcemia Replete as needed   Right middle lobe nodule Incidental finding on CT chest, nodule measuring up to 1.4 cm.  -Repeat CT chest recommended 4 weeks.    DVT prophylaxis: Lovenox Code Status: Full code Family Communication:   Hypoxia  with room air saturating 79% on room air.  Hopefully we are able to wean this off in next 24-48 hours      Diet Orders (From admission, onward)     Start     Ordered   05/28/23 1900  Diet regular Room service appropriate? Yes; Fluid consistency: Thin  Diet effective now       Question Answer Comment  Room service appropriate? Yes   Fluid consistency: Thin      05/28/23 1901            Subjective: Shortness of breath improved but still getting quite hypoxic and some dyspnea on exertion with ambulation.  Examination:  Constitutional: Not in acute distress Respiratory: Slightly diminished breath sounds bilaterally Cardiovascular: Normal sinus rhythm, no rubs Abdomen: Nontender nondistended good bowel sounds Musculoskeletal: No edema noted Skin: No rashes seen Neurologic: CN 2-12 grossly intact.  And nonfocal Psychiatric: Normal judgment and insight. Alert and oriented x 3. Normal mood.   Objective: Vitals:   05/31/23 0051 05/31/23 0336 05/31/23 0731 05/31/23 0740  BP: 122/82 125/85  117/83  Pulse:  89  87  Resp:  (!) 22  19  Temp: 97.9 F (36.6 C) 98 F (36.7 C)  97.7 F (36.5 C)  TempSrc: Oral   Oral  SpO2: 90% 97% 98% 98%  Weight:      Height:        Intake/Output Summary (Last 24 hours) at 05/31/2023 1052 Last data filed at 05/31/2023 1012 Gross per 24 hour  Intake 1597.26 ml  Output 1000 ml  Net 597.26 ml   Filed Weights   05/29/23 0500  Weight: 53.8 kg    Scheduled Meds:  arformoterol  15 mcg Nebulization BID   budesonide (PULMICORT) nebulizer solution  0.5 mg Nebulization BID   enoxaparin (LOVENOX) injection  40 mg Subcutaneous Q24H   guaiFENesin  1,200 mg Oral BID   insulin aspart  0-9 Units Subcutaneous TID WC   insulin glargine-yfgn  6 Units Subcutaneous Daily   methylPREDNISolone (SOLU-MEDROL) injection  40 mg Intravenous Daily   nicotine  21 mg Transdermal Daily   revefenacin  175 mcg Nebulization Daily   Continuous Infusions:  sodium  chloride Stopped (05/28/23 1748)   sodium chloride 65 mL/hr at 05/31/23 1012   azithromycin 500 mg (05/30/23 1724)    Nutritional status     Body mass index is 15.86 kg/m.  Data Reviewed:   CBC: Recent Labs  Lab 05/28/23 1110 05/28/23 1933 05/29/23 0305 05/30/23 0340 05/31/23 0120  WBC 9.8 6.5 6.4 5.7 16.2*  NEUTROABS 8.1*  --   --   --   --   HGB 14.5 12.4* 12.5* 12.2* 12.2*  HCT 43.3 38.7* 39.4 38.1* 38.4*  MCV 95.2 96.0 97.5 98.4 97.2  PLT 171 143* 149* 153 169   Basic Metabolic Panel: Recent Labs  Lab 05/28/23 1110 05/28/23 1933 05/29/23 0305 05/29/23 0731 05/30/23 0340 05/31/23 0120  NA 129*  --  131*  --  132* 132*  K 4.2  --  3.9  --  3.9 3.5  CL 87*  --  90*  --  94* 93*  CO2 34*  --  34*  --  33* 34*  GLUCOSE 211*  --  121*  --  233* 174*  BUN 7*  --  <5*  --  5* 8  CREATININE 0.36* 0.30* 0.33*  --  0.40* 0.48*  CALCIUM 8.7*  --  7.6*  --  7.9* 8.0*  MG 1.4*  --   --   --  1.3* 1.6*  PHOS  --   --   --  2.6  --   --    GFR: Estimated Creatinine Clearance: 71 mL/min (A) (by C-G formula based on SCr of 0.48 mg/dL (L)). Liver Function Tests: Recent Labs  Lab 05/28/23 1110 05/29/23 0305  AST 20 20  ALT 18 24  ALKPHOS 84 77  BILITOT 1.0 0.1*  PROT 6.2* 5.0*  ALBUMIN 3.8 2.5*   No results for input(s): "LIPASE", "AMYLASE" in the last 168 hours. No results for input(s): "AMMONIA" in the last 168 hours. Coagulation Profile: No results for input(s): "INR", "PROTIME" in the last 168 hours. Cardiac Enzymes: Recent Labs  Lab 05/30/23 0340  CKTOTAL 46*   BNP (last 3 results) No results for input(s): "PROBNP" in the last 8760 hours. HbA1C: Recent Labs    05/28/23 1933  HGBA1C 7.4*   CBG: Recent Labs  Lab 05/30/23 1309 05/30/23 1617 05/30/23 2106 05/31/23 0623 05/31/23 0650  GLUCAP 411* 390* 432* 62* 71   Lipid Profile: Recent Labs    05/29/23 0305  CHOL 89  HDL 40*  LDLCALC 42  TRIG 34  CHOLHDL 2.2   Thyroid Function  Tests: Recent Labs    05/30/23 0340  TSH 0.302*   Anemia Panel: Recent Labs    05/28/23 1110  VITAMINB12 486  FOLATE 15.2   Sepsis Labs: Recent Labs  Lab 05/29/23 0731  PROCALCITON <0.10    Recent Results (from the past 240 hour(s))  Resp panel by RT-PCR (RSV, Flu A&B, Covid) Anterior Nasal Swab     Status: None  Collection Time: 05/28/23  2:47 PM   Specimen: Anterior Nasal Swab  Result Value Ref Range Status   SARS Coronavirus 2 by RT PCR NEGATIVE NEGATIVE Final    Comment: (NOTE) SARS-CoV-2 target nucleic acids are NOT DETECTED.  The SARS-CoV-2 RNA is generally detectable in upper respiratory specimens during the acute phase of infection. The lowest concentration of SARS-CoV-2 viral copies this assay can detect is 138 copies/mL. A negative result does not preclude SARS-Cov-2 infection and should not be used as the sole basis for treatment or other patient management decisions. A negative result may occur with  improper specimen collection/handling, submission of specimen other than nasopharyngeal swab, presence of viral mutation(s) within the areas targeted by this assay, and inadequate number of viral copies(<138 copies/mL). A negative result must be combined with clinical observations, patient history, and epidemiological information. The expected result is Negative.  Fact Sheet for Patients:  BloggerCourse.com  Fact Sheet for Healthcare Providers:  SeriousBroker.it  This test is no t yet approved or cleared by the [place] FDA and  has been authorized for detection and/or diagnosis of SARS-CoV-2 by FDA under an Emergency Use Authorization (EUA). This EUA will remain  in effect (meaning this test can be used) for the duration of the COVID-19 declaration under Section 564(b)(1) of the Act, 21 U.S.C.section 360bbb-3(b)(1), unless the authorization is terminated  or revoked sooner.       Influenza A by  PCR NEGATIVE NEGATIVE Final   Influenza B by PCR NEGATIVE NEGATIVE Final    Comment: (NOTE) The Xpert Xpress SARS-CoV-2/FLU/RSV plus assay is intended as an aid in the diagnosis of influenza from Nasopharyngeal swab specimens and should not be used as a sole basis for treatment. Nasal washings and aspirates are unacceptable for Xpert Xpress SARS-CoV-2/FLU/RSV testing.  Fact Sheet for Patients: BloggerCourse.com  Fact Sheet for Healthcare Providers: SeriousBroker.it  This test is not yet approved or cleared by the [place] FDA and has been authorized for detection and/or diagnosis of SARS-CoV-2 by FDA under an Emergency Use Authorization (EUA). This EUA will remain in effect (meaning this test can be used) for the duration of the COVID-19 declaration under Section 564(b)(1) of the Act, 21 U.S.C. section 360bbb-3(b)(1), unless the authorization is terminated or revoked.     Resp Syncytial Virus by PCR NEGATIVE NEGATIVE Final    Comment: (NOTE) Fact Sheet for Patients: BloggerCourse.com  Fact Sheet for Healthcare Providers: SeriousBroker.it  This test is not yet approved or cleared by the [place] FDA and has been authorized for detection and/or diagnosis of SARS-CoV-2 by FDA under an Emergency Use Authorization (EUA). This EUA will remain in effect (meaning this test can be used) for the duration of the COVID-19 declaration under Section 564(b)(1) of the Act, 21 U.S.C. section 360bbb-3(b)(1), unless the authorization is terminated or revoked.  Performed at [facility], [address], [place], [place] [address]   Respiratory (~20 pathogens) panel by PCR     Status: None   Collection Time: 05/29/23  7:21 AM   Specimen: Nasopharyngeal Swab; Respiratory  Result Value Ref Range Status   Adenovirus NOT DETECTED NOT DETECTED Final   Coronavirus 229E  NOT DETECTED NOT DETECTED Final    Comment: (NOTE) The Coronavirus on the Respiratory Panel, DOES NOT test for the novel  Coronavirus (2019 nCoV)    Coronavirus HKU1 NOT DETECTED NOT DETECTED Final   Coronavirus NL63 NOT DETECTED NOT DETECTED Final   Coronavirus OC43 NOT DETECTED NOT DETECTED Final  Metapneumovirus NOT DETECTED NOT DETECTED Final   Rhinovirus / Enterovirus NOT DETECTED NOT DETECTED Final   Influenza A NOT DETECTED NOT DETECTED Final   Influenza B NOT DETECTED NOT DETECTED Final   Parainfluenza Virus 1 NOT DETECTED NOT DETECTED Final   Parainfluenza Virus 2 NOT DETECTED NOT DETECTED Final   Parainfluenza Virus 3 NOT DETECTED NOT DETECTED Final   Parainfluenza Virus 4 NOT DETECTED NOT DETECTED Final   Respiratory Syncytial Virus NOT DETECTED NOT DETECTED Final   Bordetella pertussis NOT DETECTED NOT DETECTED Final   Bordetella Parapertussis NOT DETECTED NOT DETECTED Final   Chlamydophila pneumoniae NOT DETECTED NOT DETECTED Final   Mycoplasma pneumoniae NOT DETECTED NOT DETECTED Final    Comment: Performed at [facility], [address], [place], [place] [address]         Radiology Studies: No results found.         LOS: 2 days   Time spent= 35 mins    Rita Vialpando Joline Maxcy, MD [facility]  If 7PM-7AM, please contact night-coverage  05/31/2023, 10:52 AM

## 2023-05-31 NOTE — Inpatient Diabetes Management (Signed)
Inpatient Diabetes Program Recommendations  AACE/ADA: New Consensus Statement on Inpatient Glycemic Control (2015)  Target Ranges:  Prepandial:   less than 140 mg/dL      Peak postprandial:   less than 180 mg/dL (1-2 hours)      Critically ill patients:  140 - 180 mg/dL   Lab Results  Component Value Date   GLUCAP 71 05/31/2023   HGBA1C 7.4 (H) 05/28/2023    Review of Glycemic Control  Latest Reference Range & Units 05/30/23 13:00 05/30/23 13:09 05/30/23 16:17 05/30/23 21:06 05/31/23 06:23 05/31/23 06:50  Glucose-Capillary 70 - 99 mg/dL 161 (H) 096 (H) 045 (H) 432 (H) 62 (L) 71   Diabetes history: DM 2 Outpatient Diabetes medications: None Current orders for Inpatient glycemic control:  Novolog 0-9 units tid with meals Semglee 6 units daily Solumedrol 40 mg daily  Inpatient Diabetes Program Recommendations:    If appropriate, consider adding Novolog 3 units tid with meals for meal coverage (hold if patient eats less than 50% or NPO).    Thanks,  Beryl Meager, RN, BC-ADM Inpatient Diabetes Coordinator Pager [phone]  (8a-5p)

## 2023-06-01 ENCOUNTER — Other Ambulatory Visit (HOSPITAL_COMMUNITY): Payer: Self-pay

## 2023-06-01 ENCOUNTER — Other Ambulatory Visit: Payer: Self-pay

## 2023-06-01 DIAGNOSIS — J9601 Acute respiratory failure with hypoxia: Secondary | ICD-10-CM | POA: Diagnosis not present

## 2023-06-01 DIAGNOSIS — J189 Pneumonia, unspecified organism: Secondary | ICD-10-CM | POA: Diagnosis not present

## 2023-06-01 LAB — BASIC METABOLIC PANEL
Anion gap: 9 (ref 5–15)
BUN: 9 mg/dL (ref 8–23)
CO2: 22 mmol/L (ref 22–32)
Calcium: 8.8 mg/dL — ABNORMAL LOW (ref 8.9–10.3)
Chloride: 104 mmol/L (ref 98–111)
Creatinine, Ser: 0.69 mg/dL (ref 0.61–1.24)
GFR, Estimated: >60 mL/min (ref >60)
Glucose, Bld: 168 mg/dL — ABNORMAL HIGH (ref 70–99)
Potassium: 4.2 mmol/L (ref 3.5–5.1)
Sodium: 135 mmol/L (ref 135–145)

## 2023-06-01 LAB — GLUCOSE, CAPILLARY
Glucose-Capillary: 282 mg/dL — ABNORMAL HIGH (ref 70–99)
Glucose-Capillary: 296 mg/dL — ABNORMAL HIGH (ref 70–99)
Glucose-Capillary: 229 mg/dL — ABNORMAL HIGH (ref 70–99)

## 2023-06-01 LAB — MAGNESIUM: Magnesium: 2 mg/dL (ref 1.7–2.4)

## 2023-06-01 MED ORDER — SENNOSIDES-DOCUSATE SODIUM 8.6-50 MG PO TABS
1.0000 | ORAL_TABLET | Freq: Every evening | ORAL | 0 refills | Status: DC | PRN
  Filled 2023-06-01 (×2): qty 30, 30d supply, fill #0

## 2023-06-01 MED ORDER — NICOTINE 21 MG/24HR TD PT24
21.0000 mg | MEDICATED_PATCH | Freq: Every day | TRANSDERMAL | 0 refills | Status: DC
  Filled 2023-06-01 (×2): qty 28, 28d supply, fill #0

## 2023-06-01 MED ORDER — BUDESONIDE 0.5 MG/2ML IN SUSP
0.5000 mg | Freq: Two times a day (BID) | RESPIRATORY_TRACT | 0 refills | Status: DC
  Filled 2023-06-01 – 2023-06-07 (×3): qty 120, 30d supply, fill #0

## 2023-06-01 MED ORDER — REVEFENACIN 175 MCG/3ML IN SOLN
175.0000 ug | Freq: Every day | RESPIRATORY_TRACT | 1 refills | Status: DC
  Filled 2023-06-01 – 2023-06-07 (×3): qty 90, 30d supply, fill #0

## 2023-06-01 MED ORDER — IPRATROPIUM-ALBUTEROL 0.5-2.5 (3) MG/3ML IN SOLN
3.0000 mL | RESPIRATORY_TRACT | 0 refills | Status: DC | PRN
  Filled 2023-06-01 (×2): qty 360, 20d supply, fill #0

## 2023-06-01 MED ORDER — PREDNISONE 10 MG PO TABS
ORAL_TABLET | ORAL | 0 refills | Status: AC
  Filled 2023-06-01: qty 48, fill #0
  Filled 2023-06-01: qty 48, 15d supply, fill #0

## 2023-06-01 MED ORDER — ARFORMOTEROL TARTRATE 15 MCG/2ML IN NEBU
15.0000 ug | INHALATION_SOLUTION | Freq: Two times a day (BID) | RESPIRATORY_TRACT | 0 refills | Status: DC
  Filled 2023-06-01: qty 60, 15d supply, fill #0
  Filled 2023-06-06 – 2023-06-07 (×2): qty 120, 30d supply, fill #0

## 2023-06-01 NOTE — TOC Progression Note (Signed)
Transition of Care [facility]) - Progression Note    Patient Details  Name: [name] MRN: [id number] Date of Birth: 08-Oct-1961  Transition of Care [facility]) CM/SW Contact  Nadene Rubins Adria Devon, RN Phone Number: 06/01/2023, 12:23 PM  Clinical Narrative:      Followed up with Zack with [employer] regarding home oxygen . Per Zack oxygen was delivered to room this am. Tried to call patient no answer . Secure chatted RN  Expected Discharge Plan: Home/Self Care Barriers to Discharge: Inadequate or no insurance, Continued Medical Work up  Expected Discharge Plan and Services   Discharge Planning Services: CM Consult   Living arrangements for the past 2 months: Single Family Home Expected Discharge Date: 06/01/23               DME Arranged: Oxygen DME Agency: [employer] Date DME Agency Contacted: 05/31/23   Representative spoke with at DME Agency: Timothy Lasso with [facility]             Social Determinants of Health (SDOH) Interventions SDOH Screenings   Food Insecurity: No Food Insecurity (05/28/2023)  Housing: Patient Declined (05/28/2023)  Transportation Needs: No Transportation Needs (05/28/2023)  Utilities: Not At Risk (05/28/2023)  Depression (PHQ2-9): Low Risk  (02/07/2022)  Tobacco Use: High Risk (05/28/2023)    Readmission Risk Interventions     No data to display

## 2023-06-01 NOTE — Inpatient Diabetes Management (Signed)
Inpatient Diabetes Program Recommendations  AACE/ADA: New Consensus Statement on Inpatient Glycemic Control   Target Ranges:  Prepandial:   less than 140 mg/dL      Peak postprandial:   less than 180 mg/dL (1-2 hours)      Critically ill patients:  140 - 180 mg/dL    Latest Reference Range & Units 05/31/23 06:23 05/31/23 06:50 05/31/23 10:18 05/31/23 11:34 05/31/23 15:52 05/31/23 17:22 05/31/23 21:34 06/01/23 06:06 06/01/23 08:47  Glucose-Capillary 70 - 99 mg/dL 62 (L) 71      Semglee 6 units  Solumedrol 40 mg 191 (H)  Novolog 2 units 208 (H) 235 (H)  Novolog 6 units 282 (H) 282 (H) 296 (H)  Novolog 8 units  Semglee 6 units  Solumedrol 40 mg    Latest Reference Range & Units 05/30/23 06:11 05/30/23 13:00 05/30/23 13:09 05/30/23 15:00 05/30/23 16:17 05/30/23 21:06  Glucose-Capillary 70 - 99 mg/dL 865 (H) 784 (H) 696 (H)  Novolog 12 units      Semglee 6 units 390 (H)  Novolog 9 units 432 (H)  Novolog 15 units @23 :06    Review of Glycemic Control  Current orders for Inpatient glycemic control: Semglee 6 units daily, Novolog 3 units TID with meals, Novolog 0-9 units TID with meals; Solumedrol 40 mg daily  Inpatient Diabetes Program Recommendations:    Insulin: If steroids are continued as ordered, please consider increasing Semglee to 8 units daily and adding Novolog 0-5 units at bedtime for bedtime correction.  Thanks, Orlando Penner, RN, MSN, CDCES Diabetes Coordinator Inpatient Diabetes Program [phone] (Team Pager from 8am to 5pm)

## 2023-06-01 NOTE — Discharge Summary (Addendum)
Physician Discharge Summary  Patient ID: [name] MRN: [id number] DOB/AGE: 1961/08/09 62 y.o.  Admit date: 05/28/2023 Discharge date: 06/01/2023  Admission Diagnoses:  Discharge Diagnoses:  Principal Problem:   CAP (community acquired pneumonia) Active Problems:   Acute bronchitis   Discharged Condition: stable  Hospital Course: Patient is a 62 year old with history of COPD, diabetes mellitus type 2, pancreatitis, HTN, HLD and alcohol abuse.  Patient presented to the hospital with generalized lethargy and weakness ongoing for past few weeks and having difficulty completing simple tasks.  He has also had weight loss during this time.  Chest x-ray shows bibasilar atelectasis.  CT chest concerning for peripheral groundglass opacity especially on the right lower base with mucous plugging concerning for bronchopneumonia.  Patient was diagnosed with community-acquired pneumonia, admitted and managed with IV Rocephin and azithromycin.  Eventually Rocephin was discontinued due to negative procalcitonin.  Still hypoxic with ambulation.  Patient will be discharged home on supplemental oxygen.  Acute bronchitis/COPD exacerbation: Active tobacco use:: - Initially treated with IV Rocephin and azithromycin. -Rocephin was eventually discontinued. -Procalcitonin came back negative. -Ambulatory pulse ox dropped to 79% on room air.  Patient will be discharged back home on supplemental oxygen. -RVP negative.  Completed 5-day azithromycin course - Patient was also managed with Solu-Medrol, bronchodilators scheduled and as needed.  I-S/flutter valve.   Generalized weakness - CK was 46. -TSH was 0.302. -PCP to repeat thyroid function test in 4 to 6 weeks. - B12, folate are normal.   -PT/OT-no follow-up needed   Type 2 diabetes -A1c 7.4.  Holding metformin - Sliding scale and Accu-Cheks     Hx of ETOH use disorder Used to drink 8-10 beers a day for several years. Last drink was a few  months ago. -Continue to monitor   Hyperlipidemia Lipid panel appears to be stable.  Continue outpatient Lipitor   Hyponatremia stable   Hypomagnesemia/hypocalcemia Replete as needed   Right middle lobe nodule Incidental finding on CT chest, nodule measuring up to 1.4 cm.  -Repeat CT chest recommended 4 weeks.   Moderate Malnutrition:    Consults: None  Significant Diagnostic Studies:    Discharge Exam: Blood pressure (!) 145/103, pulse (!) 101, temperature 97.9 F (36.6 C), temperature source Oral, resp. rate 18, height 6' 0.5" (1.842 m), weight 53.8 kg, SpO2 92%.   Disposition: Discharge disposition: 01-Home or Self Care       Discharge Instructions     Ambulatory Referral for Lung Cancer Scre   Complete by: As directed    Diet - low sodium heart healthy   Complete by: As directed    Diet Carb Modified   Complete by: As directed    Increase activity slowly   Complete by: As directed       Allergies as of 06/01/2023   No Known Allergies      Medication List     STOP taking these medications    atorvastatin 10 MG tablet Commonly known as: LIPITOR   ferrous sulfate 325 (65 FE) MG tablet       TAKE these medications    arformoterol 15 MCG/2ML Nebu Commonly known as: BROVANA Take 2 mLs (15 mcg total) by nebulization 2 (two) times daily.   budesonide 0.5 MG/2ML nebulizer solution Commonly known as: PULMICORT Take 2 mLs (0.5 mg total) by nebulization 2 (two) times daily.   glucose blood test strip Commonly known as: True Metrix Blood Glucose Test Use as instructed-fasting before breakfast; 2 hours after largest meal  of the day and before bedtime   ipratropium-albuterol 0.5-2.5 (3) MG/3ML Soln Commonly known as: DUONEB Take 3 mLs by nebulization every 4 (four) hours as needed.   metFORMIN 1000 MG tablet Commonly known as: GLUCOPHAGE Take 1 tablet (1,000 mg total) by mouth 2 (two) times daily with a meal.   nicotine 21 mg/24hr  patch Commonly known as: NICODERM CQ - dosed in mg/24 hours Place 1 patch (21 mg total) onto the skin daily. Start taking on: June 02, 2023   predniSONE 10 MG tablet Commonly known as: DELTASONE Prednisone 60 mg po once daily X 3 days, then 40 mg po daily X 3 days, then 30 mg po daily X 3 days, then 20 mg po daily X 3 days and then 10 mg po daily X 3 days and stop   revefenacin 175 MCG/3ML nebulizer solution Commonly known as: YUPELRI Take 3 mLs (175 mcg total) by nebulization daily. Start taking on: June 02, 2023   senna-docusate 8.6-50 MG tablet Commonly known as: Senokot-S Take 1 tablet by mouth at bedtime as needed for moderate constipation.   True Metrix Air Glucose Meter w/Device Kit 1 kit by Does not apply route 2 (two) times daily at 8 am and 10 pm.   TRUEplus Lancets 28G Misc Use when checking blood sugar fasting, at bedtime and about 2 hours after largest meal of the day               Durable Medical Equipment  (From admission, onward)           Start     Ordered   05/31/23 1049  For home use only DME oxygen  Once       Question Answer Comment  Length of Need Lifetime   Mode or (Route) Nasal cannula   Liters per Minute 2   Frequency Continuous (stationary and portable oxygen unit needed)   Oxygen delivery system Gas      05/31/23 1048            Time spent: 35 minutes.  SignedBarnetta Chapel 06/01/2023, 11:50 AM

## 2023-06-01 NOTE — Progress Notes (Signed)
Discharge instructions provided to patient. All medications, follow up appointments, and discharge instructions provided to patient. IV out. Monitor off. Discharging home with mom. O2 instructions reviewed with mom and patient.

## 2023-06-03 ENCOUNTER — Telehealth: Payer: Self-pay

## 2023-06-03 NOTE — Telephone Encounter (Signed)
Left message on mothers phone to call back to [facility], will have to get patient permission. Previous message from another employee stating she called and was worried about patient medications.

## 2023-06-03 NOTE — Telephone Encounter (Signed)
Mother [name] had called to another [facility] associate stating she was worried about getting patient medications and wondered if they could be switched over to a different pharmacy ( this was relayed in a message to me). The patient was discharged on Thursday July 18.   Called patient back and left a confidential message to give this RNCM a call regarding  these mediations.

## 2023-06-04 ENCOUNTER — Other Ambulatory Visit: Payer: Self-pay | Admitting: Internal Medicine

## 2023-06-04 ENCOUNTER — Other Ambulatory Visit: Payer: Self-pay

## 2023-06-04 DIAGNOSIS — E1165 Type 2 diabetes mellitus with hyperglycemia: Secondary | ICD-10-CM

## 2023-06-04 NOTE — Telephone Encounter (Signed)
Medication Refill - Medication: metFORMIN (GLUCOPHAGE) 1000 MG tablet  Has the patient contacted their pharmacy? Yes, advised to call office.  Preferred Pharmacy (with phone number or street name): [facility] [facility]  Phone: [phone] Fax: [phone]   Has the patient been seen for an appointment in the last year OR does the patient have an upcoming appointment? No, last seen 01/2022, waiting for a call back to schedule HFU visit.   Patient's mother [name] # [phone]

## 2023-06-05 ENCOUNTER — Telehealth: Payer: Self-pay

## 2023-06-05 ENCOUNTER — Other Ambulatory Visit: Payer: Self-pay

## 2023-06-05 DIAGNOSIS — J449 Chronic obstructive pulmonary disease, unspecified: Secondary | ICD-10-CM

## 2023-06-05 MED ORDER — METFORMIN HCL 1000 MG PO TABS
1000.0000 mg | ORAL_TABLET | Freq: Two times a day (BID) | ORAL | 0 refills | Status: DC
Start: 2023-06-05 — End: 2023-09-18
  Filled 2023-06-05: qty 180, 90d supply, fill #0
  Filled 2023-06-06: qty 60, 30d supply, fill #0

## 2023-06-05 NOTE — Progress Notes (Signed)
Receive message to contact patient's sister concerning patient's medication, nebulizer. Noted Erskine Squibb [facility] from [facility] following patient since discharge. Telephone call to sister [name] [phone] to inform her that Erskine Squibb [facility] would be in touch with her. [name] [facility] Transitional of Care Supervisor [phone]

## 2023-06-05 NOTE — Telephone Encounter (Signed)
Requested Prescriptions  Pending Prescriptions Disp Refills   metFORMIN (GLUCOPHAGE) 1000 MG tablet 180 tablet 0    Sig: Take 1 tablet (1,000 mg total) by mouth 2 (two) times daily with a meal.     Endocrinology:  Diabetes - Biguanides Failed - 06/04/2023  2:59 PM      Failed - Valid encounter within last 6 months    Recent Outpatient Visits           1 year ago Type 2 diabetes mellitus with hyperglycemia, without long-term current use of insulin (HCC)   [facility] Jonah Blue B, MD   1 year ago Need for vaccination against Streptococcus pneumoniae   [facility] [place], Cornelius Moras, RPH-CPP   1 year ago Type 2 diabetes mellitus with hyperglycemia, without long-term current use of insulin (HCC)   [facility] Jonah Blue B, MD   1 year ago No-show for appointment   [facility] Jonah Blue B, MD   2 years ago Type 2 diabetes mellitus with hyperglycemia, without long-term current use of insulin (HCC)   [facility] Marcine Matar, MD       Future Appointments             In 1 week Marcine Matar, MD [facility]            Failed - CBC within normal limits and completed in the last 12 months    WBC  Date Value Ref Range Status  05/31/2023 16.2 (H) 4.0 - 10.5 K/uL Final   RBC  Date Value Ref Range Status  05/31/2023 3.95 (L) 4.22 - 5.81 MIL/uL Final   Hemoglobin  Date Value Ref Range Status  05/31/2023 12.2 (L) 13.0 - 17.0 g/dL Final  16/08/9603 54.0 (L) 13.0 - 17.7 g/dL Final   HCT  Date Value Ref Range Status  05/31/2023 38.4 (L) 39.0 - 52.0 % Final   Hematocrit  Date Value Ref Range Status  02/07/2022 35.2 (L) 37.5 - 51.0 % Final   MCHC  Date Value Ref Range Status  05/31/2023 31.8 30.0 - 36.0 g/dL Final   [facility]  Date Value Ref  Range Status  05/31/2023 30.9 26.0 - 34.0 pg Final   MCV  Date Value Ref Range Status  05/31/2023 97.2 80.0 - 100.0 fL Final  02/07/2022 100 (H) 79 - 97 fL Final   No results found for: "PLTCOUNTKUC", "LABPLAT", "POCPLA" RDW  Date Value Ref Range Status  05/31/2023 15.6 (H) 11.5 - 15.5 % Final  02/07/2022 13.1 11.6 - 15.4 % Final         Passed - Cr in normal range and within 360 days    Creatinine, Ser  Date Value Ref Range Status  06/01/2023 0.69 0.61 - 1.24 mg/dL Final   Creatinine, POC  Date Value Ref Range Status  03/29/2017 50 mg/dL Final         Passed - HBA1C is between 0 and 7.9 and within 180 days    HbA1c, POC (prediabetic range)  Date Value Ref Range Status  09/17/2018 10.6 (A) 5.7 - 6.4 % Final   HbA1c, POC (controlled diabetic range)  Date Value Ref Range Status  02/07/2022 8.8 (A) 0.0 - 7.0 % Final   Hgb A1c MFr Bld  Date Value Ref Range Status  05/28/2023 7.4 (  H) 4.8 - 5.6 % Final    Comment:    (NOTE) Pre diabetes:          5.7%-6.4%  Diabetes:              >6.4%  Glycemic control for   <7.0% adults with diabetes          Passed - eGFR in normal range and within 360 days    GFR calc Af Amer  Date Value Ref Range Status  07/22/2020 120 >59 mL/min/1.73 Final    Comment:    **[facility] currently reports eGFR in compliance with the current**   recommendations of the [employer]. [facility] will   update reporting as new guidelines are published from the NKF-ASN   Task force.    GFR, Estimated  Date Value Ref Range Status  06/01/2023 >60 >60 mL/min Final    Comment:    (NOTE) Calculated using the CKD-EPI Creatinine Equation (2021)    eGFR  Date Value Ref Range Status  02/07/2022 118 >59 mL/min/1.73 Final         Passed - B12 Level in normal range and within 720 days    Vitamin B-12  Date Value Ref Range Status  05/28/2023 486 180 - 914 pg/mL Final    Comment:    (NOTE) This assay is not validated for testing neonatal  or myeloproliferative syndrome specimens for Vitamin B12 levels. Performed at [facility], [address] [place], [place] [address]

## 2023-06-05 NOTE — Telephone Encounter (Signed)
From the [facility] call:  The patient's mother, [name], and his sister [name], had multiple questions regarding the medications. The patient is currently staying with his mother.     [name] asked that I speak to [name] about the meds because she is confused. I then spoke to Ramona, who dose not live with her mother,  and she did not have the list of meds.  She was not sure what he has and if he has any of the medications. She said her mother picked up the meds and noted that she did not get them all- one med was $395, another was $1300; but she still could not tell me what was picked up.  I told her that I will contact the pharmacy tomorrow and inquire what was picked up and see if there are any patient assistance programs to help with the cost of the meds.    There was no order for a nebulizer and the patient has orders for 4 medications that are to be administered via a nebulizer.  He is uninsured and would have to pay out of pocket for the nebulizer. I spoke to [name], RN CM about being discharged home without a nebulizer.  He received his O2 with a LOG but the need for the nebulizer was not addressed,  I will also need to see if there is funding available to assist with the purchase of a nebulizer.  Per [facility] a nebulizer is  $ 129.99.   [name] said she has spoken to [employer] caseworker and believes that a Medicaid application has been submitted  I told her that I would check the status with the DSS caseworkers in our building.   I sent an email to [place]/ DSS to inquire about the status of an application.   The patient has an appointment with Dr Laural Benes - 06/14/2023.

## 2023-06-05 NOTE — Transitions of Care (Post Inpatient/ED Visit) (Signed)
   06/05/2023  Name: [name] MRN: [id number] DOB: [birth year]  Today's TOC FU Call Status: Today's TOC FU Call Status:: Unsuccessul Call (1st Attempt) Unsuccessful Call (1st Attempt) Date: 06/05/23  Attempted to reach the patient regarding the most recent Inpatient/ED visit.  Follow Up Plan: Additional outreach attempts will be made to reach the patient to complete the Transitions of Care (Post Inpatient/ED visit) call.   Signature  Robyne Peers, RN

## 2023-06-05 NOTE — Transitions of Care (Post Inpatient/ED Visit) (Signed)
06/05/2023  Name: [name] MRN: [id number] DOB: 1961-08-03  Today's TOC FU Call Status: Today's TOC FU Call Status:: Successful TOC FU Call Competed Unsuccessful Call (1st Attempt) Date: 06/05/23 [facility] FU Call Complete Date: 06/05/23  Transition Care Management Follow-up Telephone Call Date of Discharge: 06/01/23 Discharge Facility: [facility]) Type of Discharge: Inpatient Admission Primary Inpatient Discharge Diagnosis:: CAP How have you been since you were released from the hospital?: Better Any questions or concerns?: Yes Patient Questions/Concerns:: the patient's mother, [name], and his sister [name], had multiple questions regarding the medications.  [name] asked that I speak to [name] about the meds because she is confused. I then spoke to [place], who dose not live with her mother,  and she did not have the list of meds.  She was not sure what he has and if he has any of the medications. She said her mother picked up the meds and noted that she did not get them all- one med was $395, another was $1300; but she still could not tell me what was picked up.  I told her that I will contact the pharmacy tomorrow and inquire what was picked up and see if there are any patient assistance programs to help with the cost of the meds.  There was no order for a nebuizer and the patient has orders for 4 medications that are to be administered via a nebulizer.  He is uninsured and would have to pay out of pocket for the nebulizer. I spoke to [name] about being discharged home without a nebulizer.  He received his O2 with a LOG but the need for the nebulizer was not addressed,  I will also need to see if there is funding available to assist with the purchase of a nebulizer.  Per [facility] a nebulizer is  $ 129.99.  [name] said she has spoken to [name]/ [name] and believes that a Medicaid application has been submitted  I told her that I would check the status with the  Nathan caseworkers in our building.  Items Reviewed: Did you receive and understand the discharge instructions provided?: Yes Medications obtained,verified, and reconciled?: Partial Review Completed Reason for Partial Mediation Review: Issues with medications are noted above.  He does not have a glucometer. Any new allergies since your discharge?: No Dietary orders reviewed?: Yes Type of Diet Ordered:: low sodium, heart healthy, carb modified. Do you have support at home?: Yes People in Home: parent(s) Name of Support/Comfort Primary Source: He is currently staying with his mother, [name]  Medications Reviewed Today: Medications Reviewed Today     Reviewed by [name], CPhT (Pharmacy Technician) on 05/28/23 at 2242  Med List Status: Complete   Medication Order Taking? Sig Documenting Provider Last Dose Status Informant  atorvastatin (LIPITOR) 10 MG tablet [id number] No Take 1 tablet (10 mg total) by mouth daily.  Patient not taking: Reported on 07/12/2022   Marcine Matar, MD Not Taking Active Self  Blood Glucose Monitoring Suppl (TRUE METRIX AIR GLUCOSE METER) w/Device KIT [id number]  1 kit by Does not apply route 2 (two) times daily at 8 am and 10 pm. Fulp, Cammie, MD  Active Self  ferrous sulfate 325 (65 FE) MG tablet [id number] No Take 1 tablet (325 mg total) by mouth daily with breakfast.  Patient not taking: Reported on 07/12/2022   Marcine Matar, MD Not Taking Active Self  glucose blood (TRUE METRIX BLOOD GLUCOSE TEST) test strip  [id number]  Use as instructed-fasting before breakfast; 2 hours after largest meal of the day and before bedtime Fulp, Cammie, MD  Active Self  metFORMIN (GLUCOPHAGE) 1000 MG tablet [id number] No Take 1 tablet (1,000 mg total) by mouth 2 (two) times daily with a meal.  Patient not taking: Reported on 05/28/2023   Marcine Matar, MD Not Taking Active Self  TRUEPLUS LANCETS 28G MISC [id number]  Use when checking blood sugar fasting, at  bedtime and about 2 hours after largest meal of the day Cain Saupe, MD  Active Self            Home Care and Equipment/Supplies: Were Home Health Services Ordered?: No Any new equipment or medical supplies ordered?: Yes Name of Medical supply agency?: [facility]- O2 room concentrator and supplies, Were you able to get the equipment/medical supplies?: Yes (he said he has the O2 and uses it " sometimes'"  mostly at night.) Do you have any questions related to the use of the equipment/supplies?: No  Functional Questionnaire: Do you need assistance with bathing/showering or dressing?: No Do you need assistance with meal preparation?: Yes (his mother assists) Do you need assistance with eating?: No Do you have difficulty maintaining continence: No Do you need assistance with getting out of bed/getting out of a chair/moving?: No Do you have difficulty managing or taking your medications?: Yes (His mother is managing his med regime)  Follow up appointments reviewed: PCP Follow-up appointment confirmed?: Yes Date of PCP follow-up appointment?: 06/14/23 Follow-up Provider: [facility] Follow-up appointment confirmed?: NA Do you need transportation to your follow-up appointment?: No (his mother will drive him) Do you understand care options if your condition(s) worsen?: Yes-patient verbalized understanding    SIGNATURE Nathan Peers, RN

## 2023-06-06 ENCOUNTER — Other Ambulatory Visit: Payer: Self-pay

## 2023-06-06 MED ORDER — BUDESONIDE-FORMOTEROL FUMARATE 80-4.5 MCG/ACT IN AERO
2.0000 | INHALATION_SPRAY | Freq: Two times a day (BID) | RESPIRATORY_TRACT | 0 refills | Status: DC
  Filled 2023-06-06: qty 1, fill #0
  Filled 2023-06-07: qty 10.2, 30d supply, fill #0

## 2023-06-06 NOTE — Telephone Encounter (Signed)
I spoke to Lauro Regulus, [employer] regarding patient's medications.  She confirmed that the metformin was not picked up and the following nebulizer medications were not picked up: , brovana, budesonide and Yupelri. The costs of those neb medications were between $ [phone] each.  She said there is no patient assistance available for medications that expensive and they did not have suggestions for alternatives.    I then spoke to Julianne Handler, [facility] DSS [place] caseworker and she confirmed that the patient was approved for Medicaid yesterday. He hung up the phone before they could share that information with him, so they sent him a letter   His Medicaid ID: [id number]  I gave his Medicaid number to the pharmacy to see if they could tell me if Medicaid would cover any of the meds and the ID number was not yet registering in Epic.  It can take a few days for the number to verify in Epic and then  the pharmacy will run the costs.  With Medicaid the patient will also be able to receive a nebulizer. .    I called patient's sister, [name], and provided her with a update on the Medicaid status and medication status in the pharmacy

## 2023-06-07 ENCOUNTER — Other Ambulatory Visit: Payer: Self-pay

## 2023-06-07 ENCOUNTER — Inpatient Hospital Stay (INDEPENDENT_AMBULATORY_CARE_PROVIDER_SITE_OTHER): Payer: Self-pay

## 2023-06-07 ENCOUNTER — Other Ambulatory Visit: Payer: Self-pay | Admitting: Internal Medicine

## 2023-06-07 MED ORDER — BUDESONIDE-FORMOTEROL FUMARATE 160-4.5 MCG/ACT IN AERO
2.0000 | INHALATION_SPRAY | Freq: Two times a day (BID) | RESPIRATORY_TRACT | 4 refills | Status: DC
  Filled 2023-06-07: qty 10.3, 30d supply, fill #0

## 2023-06-07 NOTE — Telephone Encounter (Signed)
I called patient's sister, [name], and informed her that the Medicaid still has not cleared and we are trying to get her brother an inhaler to use while we wait for the Medicaid to cover the nebulizer and neb meds.  I explained that I sent a message to Dr Laural Benes with information from our pharmacy regarding an inhaler that he would be able to obtain free. That order has not been placed yet.  [name] was very Adult nurse.    Cassie- can you please call [name] when the order for the inhaler, [name], is placed with our pharmacy downstairs and either she or her mother can pick it up.    Also, Daniella can check the status of his Medicaid. She has the Medicaid ID number.  If he is approved in the system, then the pharmacy will need to check which of his meds will be covered and which may need to be changed.  Medicaid will also pay for a nebulizer, so when the Medicaid is confirmed, the order that is in my desk can be faxed to [facility].

## 2023-06-08 ENCOUNTER — Other Ambulatory Visit: Payer: Self-pay

## 2023-06-08 NOTE — Telephone Encounter (Signed)
Patient's Wife is aware that Inhaler has been sent to pharmacy.

## 2023-06-11 ENCOUNTER — Other Ambulatory Visit: Payer: Self-pay

## 2023-06-11 NOTE — Telephone Encounter (Addendum)
I spoke to patient's sister, [name], and she said she did not receive a call from anyone about the inhaler.    I spoke to patient's mother, [name], and she said she did not receive a call about the inhaler.  I explained to her that it is ready at [facility] and there is no charge.  It was ordered prior to his Medicaid clearing.  [name] said she would pick it up tomorrow.   I also explained to her that his Medicaid is now cleared in Epic and the pharmacy ran the charges for the medications and the co-pays are $ 4.00 each.  I have the order for the nebulizer and told her that I can send it to [facility] and they will be in contact with her about delivery.  She said she understood.  Dr Laural Benes - Now that they will picking up the symbicort inhaler, do you also want his mother to pick up all of the nebulizer medications?   I know he sees you on 06/14/2023.  Thanks    Order for nebulizer faxed to [facility]

## 2023-06-12 ENCOUNTER — Other Ambulatory Visit: Payer: Self-pay

## 2023-06-12 NOTE — Telephone Encounter (Signed)
I called patient's mother, [name], and instructed her to pick up all of the medications that the pharmacy has filled for her son.  She said she plans to do that today.  I also reminded her that I sent the order for the nebulizer to [facility] and they will be contacting her when the insurance coverage is verified and they are ready to deliver it.   She said she understood and and was very Adult nurse.

## 2023-06-14 ENCOUNTER — Ambulatory Visit: Payer: Medicaid Other | Attending: Internal Medicine | Admitting: Internal Medicine

## 2023-06-14 ENCOUNTER — Other Ambulatory Visit: Payer: Self-pay

## 2023-06-14 ENCOUNTER — Telehealth: Payer: Self-pay | Admitting: Internal Medicine

## 2023-06-14 ENCOUNTER — Encounter: Payer: Self-pay | Admitting: Internal Medicine

## 2023-06-14 VITALS — BP 147/84 | HR 102 | Ht 72.0 in | Wt 121.0 lb

## 2023-06-14 DIAGNOSIS — J439 Emphysema, unspecified: Secondary | ICD-10-CM

## 2023-06-14 DIAGNOSIS — J9611 Chronic respiratory failure with hypoxia: Secondary | ICD-10-CM

## 2023-06-14 DIAGNOSIS — Z9981 Dependence on supplemental oxygen: Secondary | ICD-10-CM

## 2023-06-14 DIAGNOSIS — R911 Solitary pulmonary nodule: Secondary | ICD-10-CM

## 2023-06-14 DIAGNOSIS — F1021 Alcohol dependence, in remission: Secondary | ICD-10-CM

## 2023-06-14 DIAGNOSIS — E119 Type 2 diabetes mellitus without complications: Secondary | ICD-10-CM

## 2023-06-14 DIAGNOSIS — Z09 Encounter for follow-up examination after completed treatment for conditions other than malignant neoplasm: Secondary | ICD-10-CM

## 2023-06-14 DIAGNOSIS — E46 Unspecified protein-calorie malnutrition: Secondary | ICD-10-CM

## 2023-06-14 DIAGNOSIS — Z7984 Long term (current) use of oral hypoglycemic drugs: Secondary | ICD-10-CM

## 2023-06-14 DIAGNOSIS — F17200 Nicotine dependence, unspecified, uncomplicated: Secondary | ICD-10-CM

## 2023-06-14 DIAGNOSIS — I10 Essential (primary) hypertension: Secondary | ICD-10-CM

## 2023-06-14 DIAGNOSIS — R7989 Other specified abnormal findings of blood chemistry: Secondary | ICD-10-CM

## 2023-06-14 MED ORDER — HYDROCHLOROTHIAZIDE 12.5 MG PO TABS
12.5000 mg | ORAL_TABLET | Freq: Every day | ORAL | 0 refills | Status: DC
Start: 2023-06-14 — End: 2023-09-18
  Filled 2023-06-14: qty 6, 6d supply, fill #0

## 2023-06-14 NOTE — Progress Notes (Signed)
Patient ID: [name], male    DOB: 06/23/1961  MRN: [id number]  CC: Transition of care: Date of hospitalization 7/15-19/2024 Date of phone call with case worker: 06/05/2023  Hospitalization Follow-up (Hospitalization f/u. Med refill - inhaler /Awaiting contact from [facility] pulmonology /)   Subjective: [name] is a 62 y.o. male who presents for TOC visit.  Mom, Roselind Rily, is with him. His concerns today include:  Patient with history of, diabetes, HL, pancreatitis, ETOH abuse, tob dep COPD.     Patient presents for hospital follow-up.  He was hospitalized with generalized weakness x 1 week, COPD exacerbation and weight loss.  CT of the chest showed peripheral groundglass opacities and interstitial thickening in the right middle lobe and right lower lobe, mucous plugging within the lower lobe airways and a 1.4 cm right middle lobe nodule.  Findings favored infectious or inflammatory process possible bronchopneumonia.  A follow-up CT in 3 to 4 weeks was recommended close trial of antibiotics. -Patient was treated with IV Rocephin and Zithromax.  Rocephin DC'd after procalcitonin was negative.  He was hypoxic on room air so was discharged with supplemental oxygen.  Also discharged with nebulizer treatments but patient did not have a nebulizer machine. -TSH was 0.3.  B12 and folate normal.  Seen by PT/OT.  No inpatient need A1c was 7.4 for diabetes.  Metformin was held while in hospital.  Today: COPD/pneumonia: Since discharge patient, patient did not have Medicaid and so was unable to get the Brovana, Pulmicort and DuoNeb treatments right away.  We prescribed Breyna inhaler instead until Medicaid was approved.  Mother has picked up the nebulizer treatments for Brovana, Pulmicort and DuoNeb.  Awaiting nebulizer machine from [facility]. -Still tapering off prednisone. -Mother tried calling [place] [facility] to get an appointment but was unsuccessful. -He has O2 concentrator  at home.  Not sure how many liters he is on. -Would like to get portable O2. -Feels breathing has improved compared to before hospital admission.  He continues to have a wet cough.  He is walking little bit more but only short distances within the house. -Has his own place but currently staying with his mother.  Mother wonders when it would be safe for him to return home to live by himself again. -Noted to have BL lower extremity edema today.  Reports this has been present since hospital and gets better the more he moves around. Blood pressure noted to be elevated today as well.  No prior history of hypertension.  Looks like blood pressure fluctuated up and down during hospital.  Tobacco minutes: Continues to smoke.  Not ready to quit.  EtOH use disorder: Quit 2 months ago. He reports that he is eating well since hospital discharge.  He feels he is drinking more fluids since discharge.  DM: Restarted metformin since hospital discharge.  No device to check blood sugar and he is not interested in checking his blood sugars.  Patient Active Problem List   Diagnosis Date Noted   Acute bronchitis 05/29/2023   CAP (community acquired pneumonia) 05/28/2023   Very severe alcohol intoxication with complication (HCC) 07/11/2022   Acute on chronic respiratory failure with hypoxia and hypercapnia (HCC) 07/11/2022   Acute encephalopathy 07/11/2022   Type 2 diabetes mellitus with hyperglycemia (HCC) 02/07/2022   COVID-19 vaccine series completed 12/09/2020   Influenza vaccination declined 12/09/2020   Drinking binge 12/09/2020   Hypertension 11/04/2013   Blurry vision, bilateral 11/04/2013   Pancreatitis 02/09/2012  Alcohol abuse 02/09/2012   TOBACCO ABUSE 10/19/2010   COPD (chronic obstructive pulmonary disease) (HCC) 10/19/2010   PANCREATITIS, ACUTE 10/19/2010     Current Outpatient Medications on File Prior to Visit  Medication Sig Dispense Refill   arformoterol (BROVANA) 15 MCG/2ML NEBU  Take 2 mLs (15 mcg total) by nebulization 2 (two) times daily. 120 mL 0   Blood Glucose Monitoring Suppl (TRUE METRIX AIR GLUCOSE METER) w/Device KIT 1 kit by Does not apply route 2 (two) times daily at 8 am and 10 pm. 1 kit 0   budesonide (PULMICORT) 0.5 MG/2ML nebulizer solution Take 2 mLs (0.5 mg total) by nebulization 2 (two) times daily. 120 mL 0   budesonide-formoterol (BREYNA) 160-4.5 MCG/ACT inhaler Inhale 2 puffs into the lungs in the morning and at bedtime. 10.3 g 4   glucose blood (TRUE METRIX BLOOD GLUCOSE TEST) test strip Use as instructed-fasting before breakfast; 2 hours after largest meal of the day and before bedtime 100 each 12   ipratropium-albuterol (DUONEB) 0.5-2.5 (3) MG/3ML SOLN Take 3 mLs by nebulization every 4 (four) hours as needed. 360 mL 0   metFORMIN (GLUCOPHAGE) 1000 MG tablet Take 1 tablet (1,000 mg total) by mouth 2 (two) times daily with a meal. 180 tablet 0   nicotine (NICODERM CQ - DOSED IN MG/24 HOURS) 21 mg/24hr patch Place 1 patch (21 mg total) onto the skin daily. 28 patch 0   predniSONE (DELTASONE) 10 MG tablet Take 6 tablets (60 mg total) by mouth daily for 3 days, THEN 4 tablets (40 mg total) daily for 3 days, THEN 3 tablets (30 mg total) daily for 3 days, THEN 2 tablets (20 mg total) daily for 3 days, THEN 1 tablet (10 mg total) daily for 3 days. 48 tablet 0   revefenacin (YUPELRI) 175 MCG/3ML nebulizer solution Take 3 mLs (175 mcg total) by nebulization daily. 90 mL 1   senna-docusate (SENOKOT-S) 8.6-50 MG tablet Take 1 tablet by mouth at bedtime as needed for moderate constipation. 30 tablet 0   TRUEPLUS LANCETS 28G MISC Use when checking blood sugar fasting, at bedtime and about 2 hours after largest meal of the day 100 each 6   No current facility-administered medications on file prior to visit.    No Known Allergies  Social History   Socioeconomic History   Marital status: Single    Spouse name: Not on file   Number of children: Not on file    Years of education: Not on file   Highest education level: Not on file  Occupational History   Not on file  Tobacco Use   Smoking status: Every Day    Current packs/day: 1.00    Average packs/day: 1 pack/day for 35.0 years (35.0 ttl pk-yrs)    Types: Cigarettes   Smokeless tobacco: Never  Substance and Sexual Activity   Alcohol use: Yes    Comment: occassionally    Drug use: No   Sexual activity: Not Currently  Other Topics Concern   Not on file  Social History Narrative   Not on file   Social Determinants of Health   Financial Resource Strain: Not on file  Food Insecurity: No Food Insecurity (05/28/2023)   Hunger Vital Sign    Worried About Running Out of Food in the Last Year: Never true    Ran Out of Food in the Last Year: Never true  Transportation Needs: No Transportation Needs (05/28/2023)   PRAPARE - Administrator, Civil Service (Medical):  No    Lack of Transportation (Non-Medical): No  Physical Activity: Not on file  Stress: Not on file  Social Connections: Not on file  Intimate Partner Violence: Not At Risk (05/28/2023)   Humiliation, Afraid, Rape, and Kick questionnaire    Fear of Current or Ex-Partner: No    Emotionally Abused: No    Physically Abused: No    Sexually Abused: No    Family History  Problem Relation Age of Onset   Diabetes Sister     Past Surgical History:  Procedure Laterality Date   NO PAST SURGERIES      ROS: Review of Systems Negative except as stated above  PHYSICAL EXAM: BP (!) 147/84 (BP Location: Left Arm, Patient Position: Sitting, Cuff Size: Normal)   Pulse (!) 102   Ht 6' (1.829 m)   Wt 121 lb (54.9 kg)   SpO2 (!) 83%   BMI 16.41 kg/m   Physical Exam Patient presented with no supplemental O2.  Pulse ox on room air at rest 83%. Placed on 2 L O2.  Pulse ox 97% at rest.  General appearance -older Caucasian male sitting in wheelchair in NAD.  He has a wet cough.  He appears malnourished and underweight for  height. Mental status -patient answers questions appropriately but not in a good mood Mouth -oral mucosa slightly dry Neck - supple, no significant adenopathy Chest -breath sounds decreased bilaterally with some rhonchi Heart -regular rate and rhythm. Extremities -1-2+ bilateral lower extremity edema from below the knee down     Latest Ref Rng & Units 06/01/2023    3:26 AM 05/31/2023    1:20 AM 05/30/2023    3:40 AM  CMP  Glucose 70 - 99 mg/dL 161  096  045   BUN 8 - 23 mg/dL 9  8  5    Creatinine 0.61 - 1.24 mg/dL 4.09  8.11  9.14   Sodium 135 - 145 mmol/L 135  132  132   Potassium 3.5 - 5.1 mmol/L 4.2  3.5  3.9   Chloride 98 - 111 mmol/L 104  93  94   CO2 22 - 32 mmol/L 22  34  33   Calcium 8.9 - 10.3 mg/dL 8.8  8.0  7.9    Lipid Panel     Component Value Date/Time   CHOL 89 05/29/2023 0305   CHOL 120 02/07/2022 0939   TRIG 34 05/29/2023 0305   HDL 40 (L) 05/29/2023 0305   HDL 58 02/07/2022 0939   CHOLHDL 2.2 05/29/2023 0305   VLDL 7 05/29/2023 0305   LDLCALC 42 05/29/2023 0305   LDLCALC 50 02/07/2022 0939    CBC    Component Value Date/Time   WBC 16.2 (H) 05/31/2023 0120   RBC 3.95 (L) 05/31/2023 0120   HGB 12.2 (L) 05/31/2023 0120   HGB 12.3 (L) 02/07/2022 0939   HCT 38.4 (L) 05/31/2023 0120   HCT 35.2 (L) 02/07/2022 0939   PLT 169 05/31/2023 0120   PLT 220 02/07/2022 0939   MCV 97.2 05/31/2023 0120   MCV 100 (H) 02/07/2022 0939   MCH 30.9 05/31/2023 0120   MCHC 31.8 05/31/2023 0120   RDW 15.6 (H) 05/31/2023 0120   RDW 13.1 02/07/2022 0939   LYMPHSABS 0.8 05/28/2023 1110   LYMPHSABS 2.2 07/22/2020 1030   MONOABS 0.8 05/28/2023 1110   EOSABS 0.0 05/28/2023 1110   EOSABS 0.2 07/22/2020 1030   BASOSABS 0.1 05/28/2023 1110   BASOSABS 0.1 07/22/2020 1030    ASSESSMENT  AND PLAN: 1. Hospital discharge follow-up   2. Chronic respiratory failure with hypoxia, on home oxygen therapy (HCC) 3. Pulmonary emphysema, unspecified emphysema type (HCC) 3. Lung  nodule -Recently treated for possible pneumonia.  He is completing course of prednisone.  He would benefit from having a nebulizer machine.  Will have RN touch base with [facility] to try to get this out to his house as soon as possible. -Will also try to get him portable O2 and pulse ox device. -Message sent to our referral coordinator to try to get him in with pulmonary ASAP Strongly advised to discontinue smoking. -Will need repeat CT in about 1 month to make sure there is no underlying mass in the right lungs.  Will plan to order that on his next visit with me. - Ambulatory referral to Pulmonology  5. Tobacco dependence See above.  Strongly advised to quit smoking.  Patient not ready to give a trial of quitting.  6. Hypertension, unspecified type Blood pressure elevated today.  He has significant bilateral lower extremity edema.  We will put him on hydrochlorothiazide to take 3 days a week for the next 2 weeks.  Reassess blood pressure on subsequent visit. - hydrochlorothiazide (HYDRODIURIL) 12.5 MG tablet; Take 1 tablet (12.5 mg total) by mouth daily. Only on Thursday, Saturday, and Monday.  Dispense: 6 tablet; Refill: 0  7. Protein malnutrition (HCC) Commended him on quitting alcohol.  Encouraged him to remain alcohol free.  He reports good appetite  8. Alcohol use disorder, severe, in early remission (HCC) See #7 above.  9. Abnormal thyroid blood test Plan to recheck thyroid function test in 4 to 6 weeks.  10.  Diabetes type 2 -On metformin 1 g twice a day. Recommend getting a glucometer to check blood sugars with him being on prednisone.  Informed that prednisone can cause significant elevation in blood sugars.  Patient was not interested in getting any type of device that would check his blood sugars stating he is just not interested in that.    Patient was given the opportunity to ask questions.  Patient verbalized understanding of the plan and was able to repeat key  elements of the plan.   This documentation was completed using Paediatric nurse.  Any transcriptional errors are unintentional.  Orders Placed This Encounter  Procedures   For home use only DME oxygen   Ambulatory referral to Pulmonology     Requested Prescriptions   Signed Prescriptions Disp Refills   hydrochlorothiazide (HYDRODIURIL) 12.5 MG tablet 6 tablet 0    Sig: Take 1 tablet (12.5 mg total) by mouth daily. Only on Thursday, Saturday, and Monday.    Return in about 4 weeks (around 07/12/2023).  Jonah Blue, MD, FACP

## 2023-06-15 NOTE — Telephone Encounter (Addendum)
Spoke with patient's mother [name] ) to verifily address in patient's  chart and to asked if they had received Nebulizer.  [name] voiced that they had not received Nebulizer and  patient is now staying with mother and address had been updated in patient's chart.   Spoke with Rivka Barbara at [facility] re: getting pt portable oxygen tank and nebulizer as ordered by PCP. Glenda voiced that there was a noted the states that  the patient said  that his mother was going to pick up Nebulizer at one of their retail stores. Raynelle Highland that I had just spoken to [place] and she said that she was waiting for Nebulizer and it has not arrived. Advised Glenda to deliver Nebulizer when portable O2 is delivered. Per Rivka Barbara 's request  orders emailed to her . Patient Current address given to [facility].  Glenda voiced that she would contact the department that delivers O2 and Nebulizer to  ensure that they knew to deliver nebulizer and O2 today or tomorrow if possible.

## 2023-06-18 NOTE — Telephone Encounter (Signed)
Spoke with patient . Verified name & DOB    Patient voiced that his mother was not there. Advised patient that I have reached out to [facility] again to see why is DME has not been delivered and when I get a reply I will let them know when to expect the delivery.

## 2023-06-18 NOTE — Telephone Encounter (Signed)
Pt's mother is calling in wanting to know the status of pt's Oxygen machine as well as his nebulizer. Please advise.

## 2023-06-18 NOTE — Telephone Encounter (Signed)
error 

## 2023-06-19 ENCOUNTER — Telehealth: Payer: Self-pay

## 2023-06-19 DIAGNOSIS — J189 Pneumonia, unspecified organism: Secondary | ICD-10-CM | POA: Diagnosis not present

## 2023-06-19 DIAGNOSIS — J209 Acute bronchitis, unspecified: Secondary | ICD-10-CM | POA: Diagnosis not present

## 2023-06-19 DIAGNOSIS — J449 Chronic obstructive pulmonary disease, unspecified: Secondary | ICD-10-CM | POA: Diagnosis not present

## 2023-06-19 NOTE — Telephone Encounter (Signed)
Call placed to Patient's mother [name]. Advised that patient would have to be evaluated in [facility] before they will deliver the portable oxygen tank.  [name] that this was part of the process in order for her son to be eligable for portable oxygen tanks. Advise bonnie to ask if someone could do a home evaluation instead of having to go to [facility]. [name] voiced that would be better for her since the O2 tanks they have now is so Heavy to carry.   [name] that once approved [facility] would provide education on how long the tank could stay in use and they would discuss which home O2 tanks she could send back. [name] was very appreciative for the call.

## 2023-06-19 NOTE — Telephone Encounter (Signed)
Call placed to [facility]. Spoke with Corrie.She was unable to decipher the reason why o2 and nebulizer were not sent to patient resident as of yet. Corrie voiced that she did not see an order for o2 portable tank in the system , but did see a note stating that the patients mother was going to pick up neb machine .  Emilia Beck that I spoke Glenda on Friday and faxed the documents to her and also, advised that patients mother is waiting for the deliver of the o2 portable tank and nebs machine. Mother voiced that she was not going to pick up the neb machine and she states she does  not know why her son would have said that.  Advised Corrie that I would refax information faxed to [facility] on Friday and asked if they could get out the neb machine out  today . She voiced that she would contact the person,  that took the call and ask if they could get the neb machine out today. Will follow-up.

## 2023-06-19 NOTE — Telephone Encounter (Signed)
Patient's mother [name] stated she received a call from Elease Hashimoto today about the portable oxygen tank, patient's mother stated Elease Hashimoto told her that patient would have to be evaluated in [facility] before they will deliver the portable oxygen tank and patient's mother wants to know why, as patient is not able to go to [facility] for this and patient has already been evaluated by PCP. Patient's mother stated patient has the oxygen tank that sits on the floor but needed the portable one but can not go all the way to [facility] for an evaluation, patient's mother would like a call back. Also patients mother was questioning about the big oxygen tank, for picking it up whenever the portable is delivered?   Patients mothers #[phone]     Patients mother [name] also stated Patricia's number was [phone].

## 2023-06-21 ENCOUNTER — Other Ambulatory Visit: Payer: Self-pay

## 2023-06-25 ENCOUNTER — Other Ambulatory Visit: Payer: Self-pay

## 2023-06-25 DIAGNOSIS — R911 Solitary pulmonary nodule: Secondary | ICD-10-CM

## 2023-06-28 ENCOUNTER — Inpatient Hospital Stay: Payer: Self-pay | Admitting: Internal Medicine

## 2023-07-02 DIAGNOSIS — J209 Acute bronchitis, unspecified: Secondary | ICD-10-CM | POA: Diagnosis not present

## 2023-07-02 DIAGNOSIS — J449 Chronic obstructive pulmonary disease, unspecified: Secondary | ICD-10-CM | POA: Diagnosis not present

## 2023-07-02 DIAGNOSIS — J189 Pneumonia, unspecified organism: Secondary | ICD-10-CM | POA: Diagnosis not present

## 2023-07-03 ENCOUNTER — Encounter (HOSPITAL_BASED_OUTPATIENT_CLINIC_OR_DEPARTMENT_OTHER): Payer: Self-pay

## 2023-07-03 ENCOUNTER — Ambulatory Visit (HOSPITAL_BASED_OUTPATIENT_CLINIC_OR_DEPARTMENT_OTHER)
Admission: RE | Admit: 2023-07-03 | Discharge: 2023-07-03 | Disposition: A | Discharge status: Home / Self Care | Payer: Medicaid Other | Source: Ambulatory Visit | Attending: Pulmonary Disease | Admitting: Pulmonary Disease

## 2023-07-03 DIAGNOSIS — R911 Solitary pulmonary nodule: Secondary | ICD-10-CM | POA: Insufficient documentation

## 2023-07-03 DIAGNOSIS — R918 Other nonspecific abnormal finding of lung field: Secondary | ICD-10-CM | POA: Diagnosis not present

## 2023-07-03 DIAGNOSIS — K861 Other chronic pancreatitis: Secondary | ICD-10-CM | POA: Diagnosis not present

## 2023-07-03 DIAGNOSIS — J432 Centrilobular emphysema: Secondary | ICD-10-CM | POA: Diagnosis not present

## 2023-07-05 NOTE — Telephone Encounter (Signed)
I spoke to John/[facility] and he confirmed that the nebulizer supplies and compressor were shipped to the patients home on 06/19/2023.

## 2023-07-09 ENCOUNTER — Ambulatory Visit: Payer: Medicaid Other | Admitting: Pulmonary Disease

## 2023-07-09 ENCOUNTER — Encounter: Payer: Self-pay | Admitting: Pulmonary Disease

## 2023-07-09 ENCOUNTER — Other Ambulatory Visit: Payer: Self-pay

## 2023-07-09 VITALS — BP 130/90 | HR 97 | Ht 72.0 in | Wt 128.8 lb

## 2023-07-09 DIAGNOSIS — I739 Peripheral vascular disease, unspecified: Secondary | ICD-10-CM

## 2023-07-09 DIAGNOSIS — J432 Centrilobular emphysema: Secondary | ICD-10-CM

## 2023-07-09 DIAGNOSIS — R911 Solitary pulmonary nodule: Secondary | ICD-10-CM | POA: Diagnosis not present

## 2023-07-09 MED ORDER — TIOTROPIUM BROMIDE MONOHYDRATE 18 MCG IN CAPS
18.0000 ug | ORAL_CAPSULE | Freq: Every day | RESPIRATORY_TRACT | 12 refills | Status: DC
Start: 2023-07-09 — End: 2023-09-18
  Filled 2023-07-09: qty 30, 30d supply, fill #0

## 2023-07-09 MED ORDER — FLUTICASONE-SALMETEROL 230-21 MCG/ACT IN AERO
2.0000 | INHALATION_SPRAY | Freq: Two times a day (BID) | RESPIRATORY_TRACT | 12 refills | Status: DC
Start: 2023-07-09 — End: 2023-09-18
  Filled 2023-07-09: qty 12, 30d supply, fill #0

## 2023-07-09 NOTE — Progress Notes (Signed)
Synopsis: Referred in Aug 2024 for COPD, ct follow up by Marcine Matar, MD  Subjective:   PATIENT ID: [name] GENDER: male DOB: 1961-09-12, MRN: [id number]  Chief Complaint  Patient presents with   Consult    Consult on lung nodule.    This is a 62 year old gentleman, past medical history of COPD diabetes.  Was recently admitted to the hospital with pneumonia.  Patient has a longstanding history of tobacco use.  He is currently still smoking 1 pack/day.  He has had a significant weight loss over the past year.  Nodule BMI of 17.  He is present today in the office with ongoing cough sputum production at baseline.  Currently using a nebulized medication.  He is unsure of which one has been dispensed he was discharged on triple therapy but currently believes to be using Yupelri.  Uses as needed albuterol.    Past Medical History:  Diagnosis Date   COPD (chronic obstructive pulmonary disease) (HCC)    Diabetes mellitus without complication (HCC)    Pancreatitis      Family History  Problem Relation Age of Onset   Diabetes Sister      Past Surgical History:  Procedure Laterality Date   NO PAST SURGERIES      Social History   Socioeconomic History   Marital status: Single    Spouse name: Not on file   Number of children: Not on file   Years of education: Not on file   Highest education level: Not on file  Occupational History   Not on file  Tobacco Use   Smoking status: Every Day    Current packs/day: 1.00    Average packs/day: 1 pack/day for 35.0 years (35.0 ttl pk-yrs)    Types: Cigarettes   Smokeless tobacco: Never   Tobacco comments:    Smokes 7 packs of cigarettes in a week. 07/09/2023 Tay  Substance and Sexual Activity   Alcohol use: Yes    Comment: occassionally    Drug use: No   Sexual activity: Not Currently  Other Topics Concern   Not on file  Social History Narrative   Not on file   Social Determinants of Health   Financial Resource  Strain: Not on file  Food Insecurity: No Food Insecurity (05/28/2023)   Hunger Vital Sign    Worried About Running Out of Food in the Last Year: Never true    Ran Out of Food in the Last Year: Never true  Transportation Needs: No Transportation Needs (05/28/2023)   PRAPARE - Administrator, Civil Service (Medical): No    Lack of Transportation (Non-Medical): No  Physical Activity: Not on file  Stress: Not on file  Social Connections: Not on file  Intimate Partner Violence: Not At Risk (05/28/2023)   Humiliation, Afraid, Rape, and Kick questionnaire    Fear of Current or Ex-Partner: No    Emotionally Abused: No    Physically Abused: No    Sexually Abused: No     No Known Allergies   Outpatient Medications Prior to Visit  Medication Sig Dispense Refill   Blood Glucose Monitoring Suppl (TRUE METRIX AIR GLUCOSE METER) w/Device KIT 1 kit by Does not apply route 2 (two) times daily at 8 am and 10 pm. 1 kit 0   budesonide (PULMICORT) 0.5 MG/2ML nebulizer solution Take 2 mLs (0.5 mg total) by nebulization 2 (two) times daily. 120 mL 0   budesonide-formoterol (BREYNA) 160-4.5 MCG/ACT inhaler Inhale 2  puffs into the lungs in the morning and at bedtime. 10.3 g 4   glucose blood (TRUE METRIX BLOOD GLUCOSE TEST) test strip Use as instructed-fasting before breakfast; 2 hours after largest meal of the day and before bedtime 100 each 12   hydrochlorothiazide (HYDRODIURIL) 12.5 MG tablet Take 1 tablet (12.5 mg total) by mouth daily. Only on Thursday, Saturday, and Monday. 6 tablet 0   ipratropium-albuterol (DUONEB) 0.5-2.5 (3) MG/3ML SOLN Take 3 mLs by nebulization every 4 (four) hours as needed. 360 mL 0   metFORMIN (GLUCOPHAGE) 1000 MG tablet Take 1 tablet (1,000 mg total) by mouth 2 (two) times daily with a meal. 180 tablet 0   nicotine (NICODERM CQ - DOSED IN MG/24 HOURS) 21 mg/24hr patch Place 1 patch (21 mg total) onto the skin daily. 28 patch 0   senna-docusate (SENOKOT-S) 8.6-50 MG  tablet Take 1 tablet by mouth at bedtime as needed for moderate constipation. 30 tablet 0   TRUEPLUS LANCETS 28G MISC Use when checking blood sugar fasting, at bedtime and about 2 hours after largest meal of the day 100 each 6   arformoterol (BROVANA) 15 MCG/2ML NEBU Take 2 mLs (15 mcg total) by nebulization 2 (two) times daily. 120 mL 0   revefenacin (YUPELRI) 175 MCG/3ML nebulizer solution Take 3 mLs (175 mcg total) by nebulization daily. 90 mL 1   No facility-administered medications prior to visit.    Review of Systems  Constitutional:  Negative for chills, fever, malaise/fatigue and weight loss.  HENT:  Negative for hearing loss, sore throat and tinnitus.   Eyes:  Negative for blurred vision and double vision.  Respiratory:  Positive for cough, sputum production and shortness of breath. Negative for hemoptysis, wheezing and stridor.   Cardiovascular:  Negative for chest pain, palpitations, orthopnea, leg swelling and PND.  Gastrointestinal:  Negative for abdominal pain, constipation, diarrhea, heartburn, nausea and vomiting.  Genitourinary:  Negative for dysuria, hematuria and urgency.  Musculoskeletal:  Negative for joint pain and myalgias.  Skin:  Negative for itching and rash.  Neurological:  Negative for dizziness, tingling, weakness and headaches.  Endo/Heme/Allergies:  Negative for environmental allergies. Does not bruise/bleed easily.  Psychiatric/Behavioral:  Negative for depression. The patient is not nervous/anxious and does not have insomnia.   All other systems reviewed and are negative.    Objective:  Physical Exam Vitals reviewed.  Constitutional:      General: He is not in acute distress.    Appearance: He is well-developed.  HENT:     Head: Normocephalic and atraumatic.     Mouth/Throat:     Pharynx: No oropharyngeal exudate.  Eyes:     Conjunctiva/sclera: Conjunctivae normal.     Pupils: Pupils are equal, round, and reactive to light.  Neck:     Vascular:  No JVD.     Trachea: No tracheal deviation.     Comments: Loss of supraclavicular fat Cardiovascular:     Rate and Rhythm: Normal rate and regular rhythm.     Heart sounds: S1 normal and S2 normal.     Comments: Distant heart tones Pulmonary:     Effort: No tachypnea or accessory muscle usage.     Breath sounds: No stridor. Decreased breath sounds (throughout all lung fields) present. No wheezing, rhonchi or rales.     Comments: Severely diminished breath sounds bilaterally Abdominal:     General: Bowel sounds are normal. There is no distension.     Palpations: Abdomen is soft.  Tenderness: There is no abdominal tenderness.  Musculoskeletal:        General: Deformity (muscle wasting ) present.     Right lower leg: Edema present.     Left lower leg: Edema present.  Skin:    General: Skin is warm and dry.     Capillary Refill: Capillary refill takes less than 2 seconds.     Findings: No rash.  Neurological:     Mental Status: He is alert and oriented to person, place, and time.  Psychiatric:        Behavior: Behavior normal.      Vitals:   07/09/23 1329  BP: (!) 130/90  Pulse: 97  SpO2: (!) 88%  Weight: 128 lb 12.8 oz (58.4 kg)  Height: 6' (1.829 m)   (!) 88% on RA BMI Readings from Last 3 Encounters:  07/09/23 17.47 kg/m  06/14/23 16.41 kg/m  05/29/23 15.86 kg/m   Wt Readings from Last 3 Encounters:  07/09/23 128 lb 12.8 oz (58.4 kg)  06/14/23 121 lb (54.9 kg)  05/29/23 118 lb 9.7 oz (53.8 kg)     CBC    Component Value Date/Time   WBC 16.2 (H) 05/31/2023 0120   RBC 3.95 (L) 05/31/2023 0120   HGB 12.2 (L) 05/31/2023 0120   HGB 12.3 (L) 02/07/2022 0939   HCT 38.4 (L) 05/31/2023 0120   HCT 35.2 (L) 02/07/2022 0939   PLT 169 05/31/2023 0120   PLT 220 02/07/2022 0939   MCV 97.2 05/31/2023 0120   MCV 100 (H) 02/07/2022 0939   MCH 30.9 05/31/2023 0120   MCHC 31.8 05/31/2023 0120   RDW 15.6 (H) 05/31/2023 0120   RDW 13.1 02/07/2022 0939   LYMPHSABS  0.8 05/28/2023 1110   LYMPHSABS 2.2 07/22/2020 1030   MONOABS 0.8 05/28/2023 1110   EOSABS 0.0 05/28/2023 1110   EOSABS 0.2 07/22/2020 1030   BASOSABS 0.1 05/28/2023 1110   BASOSABS 0.1 07/22/2020 1030      Chest Imaging: CT image August 2024: Right lower lobe on the right side pneumonia improved. The peripheral density with associated small cyst is still there.  It looks like it changed may be slightly smaller from previous. Likely inflammatory but will need short-term CT follow-up as he does have severe emphysematous lung disease. The patient's images have been independently reviewed by me.    Pulmonary Functions Testing Results:     No data to display          FeNO:   Pathology:   Echocardiogram:   Heart Catheterization:     Assessment & Plan:     ICD-10-CM   1. Centrilobular emphysema (HCC)  J43.2 fluticasone-salmeterol (ADVAIR HFA) 230-21 MCG/ACT inhaler    tiotropium (SPIRIVA HANDIHALER) 18 MCG inhalation capsule    Pulmonary Function Test    CT Chest Wo Contrast    2. Lung nodule  R91.1 Pulmonary Function Test    CT Chest Wo Contrast    3. Peripheral vascular disease (HCC)  I73.9       Discussion:  This is a 62 year old gentleman longstanding history of tobacco use, CT radiographic evidence of severe obstructive lung disease and severe upper lobe predominant emphysema.  Also has lower lobe emphysema.  Had a right lower lobe pneumonia which has improved on recent CT imaging.  Plan: Will recommend repeat noncontrast CT chest in 6 months. Patient was counseled heavily on smoking cessation. He states that he is not going to buy any more cigarettes and is trying to taper  down. He also has bilateral lower extremity edema, loss of hair And suspect has peripheral vascular disease I could not palpate pulse clearly.  Therefore I will refer to vascular surgery for further evaluation.  Return to clinic in 6 months.  New prescription order for triple therapy  inhaler regimens that is covered by his insurance Advair plus Spiriva.    Current Outpatient Medications:    Blood Glucose Monitoring Suppl (TRUE METRIX AIR GLUCOSE METER) w/Device KIT, 1 kit by Does not apply route 2 (two) times daily at 8 am and 10 pm., Disp: 1 kit, Rfl: 0   budesonide (PULMICORT) 0.5 MG/2ML nebulizer solution, Take 2 mLs (0.5 mg total) by nebulization 2 (two) times daily., Disp: 120 mL, Rfl: 0   budesonide-formoterol (BREYNA) 160-4.5 MCG/ACT inhaler, Inhale 2 puffs into the lungs in the morning and at bedtime., Disp: 10.3 g, Rfl: 4   fluticasone-salmeterol (ADVAIR HFA) 230-21 MCG/ACT inhaler, Inhale 2 puffs into the lungs 2 (two) times daily., Disp: 1 each, Rfl: 12   glucose blood (TRUE METRIX BLOOD GLUCOSE TEST) test strip, Use as instructed-fasting before breakfast; 2 hours after largest meal of the day and before bedtime, Disp: 100 each, Rfl: 12   hydrochlorothiazide (HYDRODIURIL) 12.5 MG tablet, Take 1 tablet (12.5 mg total) by mouth daily. Only on Thursday, Saturday, and Monday., Disp: 6 tablet, Rfl: 0   ipratropium-albuterol (DUONEB) 0.5-2.5 (3) MG/3ML SOLN, Take 3 mLs by nebulization every 4 (four) hours as needed., Disp: 360 mL, Rfl: 0   metFORMIN (GLUCOPHAGE) 1000 MG tablet, Take 1 tablet (1,000 mg total) by mouth 2 (two) times daily with a meal., Disp: 180 tablet, Rfl: 0   nicotine (NICODERM CQ - DOSED IN MG/24 HOURS) 21 mg/24hr patch, Place 1 patch (21 mg total) onto the skin daily., Disp: 28 patch, Rfl: 0   senna-docusate (SENOKOT-S) 8.6-50 MG tablet, Take 1 tablet by mouth at bedtime as needed for moderate constipation., Disp: 30 tablet, Rfl: 0   tiotropium (SPIRIVA HANDIHALER) 18 MCG inhalation capsule, Place 1 capsule (18 mcg total) into inhaler and inhale daily., Disp: 30 capsule, Rfl: 12   TRUEPLUS LANCETS 28G MISC, Use when checking blood sugar fasting, at bedtime and about 2 hours after largest meal of the day, Disp: 100 each, Rfl: 6    Josephine Igo,  DO [facility] 07/09/2023 2:15 PM

## 2023-07-09 NOTE — Patient Instructions (Signed)
Thank you for visiting Dr. Tonia Brooms at [facility]. Today we recommend the following:  Meds ordered this encounter  Medications   fluticasone-salmeterol (ADVAIR HFA) 230-21 MCG/ACT inhaler    Sig: Inhale 2 puffs into the lungs 2 (two) times daily.    Dispense:  1 each    Refill:  12   tiotropium (SPIRIVA HANDIHALER) 18 MCG inhalation capsule    Sig: Place 1 capsule (18 mcg total) into inhaler and inhale daily.    Dispense:  30 capsule    Refill:  12   Return in about 6 months (around 01/09/2024) for with APP or Dr. Tonia Brooms, after PFTs, after CT Chest.    Please do your part to reduce the spread of COVID-19.

## 2023-07-15 DIAGNOSIS — J189 Pneumonia, unspecified organism: Secondary | ICD-10-CM | POA: Diagnosis not present

## 2023-07-15 DIAGNOSIS — J209 Acute bronchitis, unspecified: Secondary | ICD-10-CM | POA: Diagnosis not present

## 2023-07-15 DIAGNOSIS — J449 Chronic obstructive pulmonary disease, unspecified: Secondary | ICD-10-CM | POA: Diagnosis not present

## 2023-07-18 ENCOUNTER — Other Ambulatory Visit: Payer: Self-pay

## 2023-08-14 DIAGNOSIS — J449 Chronic obstructive pulmonary disease, unspecified: Secondary | ICD-10-CM | POA: Diagnosis not present

## 2023-08-14 DIAGNOSIS — J209 Acute bronchitis, unspecified: Secondary | ICD-10-CM | POA: Diagnosis not present

## 2023-08-14 DIAGNOSIS — J189 Pneumonia, unspecified organism: Secondary | ICD-10-CM | POA: Diagnosis not present

## 2023-08-20 ENCOUNTER — Other Ambulatory Visit: Payer: Self-pay | Admitting: *Deleted

## 2023-08-20 DIAGNOSIS — R0989 Other specified symptoms and signs involving the circulatory and respiratory systems: Secondary | ICD-10-CM

## 2023-08-30 ENCOUNTER — Other Ambulatory Visit: Payer: Self-pay

## 2023-08-30 ENCOUNTER — Ambulatory Visit (HOSPITAL_COMMUNITY)
Admission: RE | Admit: 2023-08-30 | Discharge: 2023-08-30 | Disposition: A | Discharge status: Home / Self Care | Payer: Medicaid Other | Source: Ambulatory Visit | Attending: Vascular Surgery | Admitting: Vascular Surgery

## 2023-08-30 ENCOUNTER — Ambulatory Visit: Payer: Medicaid Other | Admitting: Vascular Surgery

## 2023-08-30 ENCOUNTER — Encounter: Payer: Self-pay | Admitting: Vascular Surgery

## 2023-08-30 VITALS — BP 98/65 | HR 83 | Temp 98.2°F | Ht 72.0 in | Wt 126.3 lb

## 2023-08-30 DIAGNOSIS — R0989 Other specified symptoms and signs involving the circulatory and respiratory systems: Secondary | ICD-10-CM

## 2023-08-30 LAB — VAS US ABI WITH/WO TBI
Left ABI: 1.23
Right ABI: 1.5

## 2023-08-30 NOTE — Progress Notes (Signed)
Office Note     CC: Weakly palpable pulses Requesting Provider:  Josephine Igo, DO  HPI: [name] is a 62 y.o. (20-Oct-1961) male presenting at the request of .Nathan Matar, MD for evaluation of weakly palpable pulses in the lower extremities with known diabetes mellitus, and significant smoking history.  On exam, [name] was doing well.  A native of [place], he continues to live there to this day.  He worked as a Product/process development scientist for years, and is now retired.  He continues to live independently.  [name] denies symptoms of claudication, ischemic rest pain, tissue loss.   He continues to smoke 1 pack/day.  He is compliant with his medication regimen.   Past Medical History:  Diagnosis Date   COPD (chronic obstructive pulmonary disease) (HCC)    Diabetes mellitus without complication (HCC)    Pancreatitis     Past Surgical History:  Procedure Laterality Date   NO PAST SURGERIES      Social History   Socioeconomic History   Marital status: Single    Spouse name: Not on file   Number of children: Not on file   Years of education: Not on file   Highest education level: Not on file  Occupational History   Not on file  Tobacco Use   Smoking status: Every Day    Current packs/day: 1.00    Average packs/day: 1 pack/day for 35.0 years (35.0 ttl pk-yrs)    Types: Cigarettes   Smokeless tobacco: Never   Tobacco comments:    Smokes 7 packs of cigarettes in a week. 07/09/2023 Tay  Substance and Sexual Activity   Alcohol use: Yes    Comment: occassionally    Drug use: No   Sexual activity: Not Currently  Other Topics Concern   Not on file  Social History Narrative   Not on file   Social Determinants of Health   Financial Resource Strain: Not on file  Food Insecurity: No Food Insecurity (05/28/2023)   Hunger Vital Sign    Worried About Running Out of Food in the Last Year: Never true    Ran Out of Food in the Last Year: Never true   Transportation Needs: No Transportation Needs (05/28/2023)   PRAPARE - Administrator, Civil Service (Medical): No    Lack of Transportation (Non-Medical): No  Physical Activity: Not on file  Stress: Not on file  Social Connections: Not on file  Intimate Partner Violence: Not At Risk (05/28/2023)   Humiliation, Afraid, Rape, and Kick questionnaire    Fear of Current or Ex-Partner: No    Emotionally Abused: No    Physically Abused: No    Sexually Abused: No   Family History  Problem Relation Age of Onset   Diabetes Sister     Current Outpatient Medications  Medication Sig Dispense Refill   metFORMIN (GLUCOPHAGE) 1000 MG tablet Take 1 tablet (1,000 mg total) by mouth 2 (two) times daily with a meal. 180 tablet 0   Blood Glucose Monitoring Suppl (TRUE METRIX AIR GLUCOSE METER) w/Device KIT 1 kit by Does not apply route 2 (two) times daily at 8 am and 10 pm. (Patient not taking: Reported on 08/30/2023) 1 kit 0   budesonide (PULMICORT) 0.5 MG/2ML nebulizer solution Take 2 mLs (0.5 mg total) by nebulization 2 (two) times daily. 120 mL 0   budesonide-formoterol (BREYNA) 160-4.5 MCG/ACT inhaler Inhale 2 puffs into the lungs in the morning and at bedtime. (Patient  not taking: Reported on 08/30/2023) 10.3 g 4   fluticasone-salmeterol (ADVAIR HFA) 230-21 MCG/ACT inhaler Inhale 2 puffs into the lungs 2 (two) times daily. (Patient not taking: Reported on 08/30/2023) 12 g 12   glucose blood (TRUE METRIX BLOOD GLUCOSE TEST) test strip Use as instructed-fasting before breakfast; 2 hours after largest meal of the day and before bedtime (Patient not taking: Reported on 08/30/2023) 100 each 12   hydrochlorothiazide (HYDRODIURIL) 12.5 MG tablet Take 1 tablet (12.5 mg total) by mouth daily. Only on Thursday, Saturday, and Monday. (Patient not taking: Reported on 08/30/2023) 6 tablet 0   ipratropium-albuterol (DUONEB) 0.5-2.5 (3) MG/3ML SOLN Take 3 mLs by nebulization every 4 (four) hours as needed.  (Patient not taking: Reported on 08/30/2023) 360 mL 0   nicotine (NICODERM CQ - DOSED IN MG/24 HOURS) 21 mg/24hr patch Place 1 patch (21 mg total) onto the skin daily. (Patient not taking: Reported on 08/30/2023) 28 patch 0   senna-docusate (SENOKOT-S) 8.6-50 MG tablet Take 1 tablet by mouth at bedtime as needed for moderate constipation. (Patient not taking: Reported on 08/30/2023) 30 tablet 0   tiotropium (SPIRIVA HANDIHALER) 18 MCG inhalation capsule Place 1 capsule (18 mcg total) into inhaler and inhale daily. (Patient not taking: Reported on 08/30/2023) 30 capsule 12   TRUEPLUS LANCETS 28G MISC Use when checking blood sugar fasting, at bedtime and about 2 hours after largest meal of the day (Patient not taking: Reported on 08/30/2023) 100 each 6   No current facility-administered medications for this visit.    No Known Allergies   REVIEW OF SYSTEMS:  [X]  denotes positive finding, [ ]  denotes negative finding Cardiac  Comments:  Chest pain or chest pressure:    Shortness of breath upon exertion:    Short of breath when lying flat:    Irregular heart rhythm:        Vascular    Pain in calf, thigh, or hip brought on by ambulation:    Pain in feet at night that wakes you up from your sleep:     Blood clot in your veins:    Leg swelling:         Pulmonary    Oxygen at home:    Productive cough:     Wheezing:         Neurologic    Sudden weakness in arms or legs:     Sudden numbness in arms or legs:     Sudden onset of difficulty speaking or slurred speech:    Temporary loss of vision in one eye:     Problems with dizziness:         Gastrointestinal    Blood in stool:     Vomited blood:         Genitourinary    Burning when urinating:     Blood in urine:        Psychiatric    Major depression:         Hematologic    Bleeding problems:    Problems with blood clotting too easily:        Skin    Rashes or ulcers:        Constitutional    Fever or chills:       PHYSICAL EXAMINATION:  Vitals:   08/30/23 1146  BP: 98/65  Pulse: 83  Temp: 98.2 F (36.8 C)  TempSrc: Temporal  SpO2: (!) 88%  Weight: 126 lb 4.8 oz (57.3 kg)  Height: 6' (1.829 m)  General:  WDWN in NAD; vital signs documented above Gait: Not observed HENT: WNL, normocephalic Pulmonary: normal non-labored breathing , without wheezing Cardiac: regular HR Abdomen: soft, NT, no masses Skin: without rashes Vascular Exam/Pulses:  Right Left  Radial 2+ (normal) 2+ (normal)  Ulnar    Femoral    Popliteal    DP 1+ (weak) 1+ (weak)  PT     Extremities: without ischemic changes, without Gangrene , without cellulitis; without open wounds;  Musculoskeletal: no muscle wasting or atrophy  Neurologic: A&O X 3;  No focal weakness or paresthesias are detected Psychiatric:  The pt has Normal affect.   Non-Invasive Vascular Imaging:   ABI Findings:  +---------+------------------+-----+---------+--------+  Right   Rt Pressure (mmHg)IndexWaveform Comment   +---------+------------------+-----+---------+--------+  Brachial 103                                       +---------+------------------+-----+---------+--------+  ATA     135               1.27 triphasic          +---------+------------------+-----+---------+--------+  PTA     159               1.50 triphasic          +---------+------------------+-----+---------+--------+  Great Toe82                0.77                    +---------+------------------+-----+---------+--------+   +---------+------------------+-----+---------+-------+  Left    Lt Pressure (mmHg)IndexWaveform Comment  +---------+------------------+-----+---------+-------+  Brachial 106                                      +---------+------------------+-----+---------+-------+  ATA     120               1.13 triphasic         +---------+------------------+-----+---------+-------+  PTA     130                1.23 triphasic         +---------+------------------+-----+---------+-------+  Great Toe82                0.77                   +---------+------------------+-----+---------+-------+   +-------+-----------+-----------+------------+------------+  ABI/TBIToday's ABIToday's TBIPrevious ABIPrevious TBI  +-------+-----------+-----------+------------+------------+  Right 1.50       0.77                                 +-------+-----------+-----------+------------+------------+  Left  1.23       0.77                                 +-------+-----------+-----------+------------+------------+        ASSESSMENT/PLAN: [name] is a 62 y.o. male presenting with decreased pedal pulses and concern for peripheral arterial disease in the setting of diabetes and longstanding smoking history.  On physical exam, he had 1+ palpable dorsalis pedis pulses.  No signs of ulceration on the feet.  ABIs were reviewed demonstrating normal waveforms bilaterally with adequate toe pressures.  I had a long conversation with [name] regarding the above, mainly the importance of A1c management and footcare in the setting of diabetes.  He is aware that diabetes significantly impact small blood vessels, which can lead to future ulceration and subsequent digit loss/limb loss.  This is magnified with continued smoking.  He is not interested in smoking cessation at this time.  [name] can follow-up with me as needed.  I asked him to call should any nonhealing wounds arise on the feet.   Recommend the following which can slow the progression of atherosclerosis and reduce the risk of major adverse cardiac / limb events:  Aspirin 81mg  PO QD.  Atorvastatin 40-80mg  PO QD (or other "high intensity" statin therapy). Complete cessation from all tobacco products. Blood glucose control with goal A1c < 7%. Blood pressure control with goal blood pressure < 140/90 mmHg. Lipid reduction therapy  with goal LDL-C <100 mg/dL (<09 if symptomatic from PAD).     Victorino Sparrow, MD [facility] [phone] Total time of patient care including pre-visit research, consultation, and documentation greater than 15 minutes

## 2023-08-30 NOTE — Progress Notes (Deleted)
Office Note     CC:  *** Requesting Provider:  Josephine Igo, DO  HPI: [name] is a 62 y.o. (January 09, 1961) male presenting at the request of .Marcine Matar, MD ***  The pt is *** on a statin for cholesterol management.  The pt is *** on a daily aspirin.   Other AC:  *** The pt is *** on medication for hypertension.   The pt is *** diabetic.  Tobacco hx:  ***  Past Medical History:  Diagnosis Date   COPD (chronic obstructive pulmonary disease) (HCC)    Diabetes mellitus without complication (HCC)    Pancreatitis     Past Surgical History:  Procedure Laterality Date   NO PAST SURGERIES      Social History   Socioeconomic History   Marital status: Single    Spouse name: Not on file   Number of children: Not on file   Years of education: Not on file   Highest education level: Not on file  Occupational History   Not on file  Tobacco Use   Smoking status: Every Day    Current packs/day: 1.00    Average packs/day: 1 pack/day for 35.0 years (35.0 ttl pk-yrs)    Types: Cigarettes   Smokeless tobacco: Never   Tobacco comments:    Smokes 7 packs of cigarettes in a week. 07/09/2023 Tay  Substance and Sexual Activity   Alcohol use: Yes    Comment: occassionally    Drug use: No   Sexual activity: Not Currently  Other Topics Concern   Not on file  Social History Narrative   Not on file   Social Determinants of Health   Financial Resource Strain: Not on file  Food Insecurity: No Food Insecurity (05/28/2023)   Hunger Vital Sign    Worried About Running Out of Food in the Last Year: Never true    Ran Out of Food in the Last Year: Never true  Transportation Needs: No Transportation Needs (05/28/2023)   PRAPARE - Administrator, Civil Service (Medical): No    Lack of Transportation (Non-Medical): No  Physical Activity: Not on file  Stress: Not on file  Social Connections: Not on file  Intimate Partner Violence: Not At Risk (05/28/2023)    Humiliation, Afraid, Rape, and Kick questionnaire    Fear of Current or Ex-Partner: No    Emotionally Abused: No    Physically Abused: No    Sexually Abused: No   *** Family History  Problem Relation Age of Onset   Diabetes Sister     Current Outpatient Medications  Medication Sig Dispense Refill   metFORMIN (GLUCOPHAGE) 1000 MG tablet Take 1 tablet (1,000 mg total) by mouth 2 (two) times daily with a meal. 180 tablet 0   Blood Glucose Monitoring Suppl (TRUE METRIX AIR GLUCOSE METER) w/Device KIT 1 kit by Does not apply route 2 (two) times daily at 8 am and 10 pm. (Patient not taking: Reported on 08/30/2023) 1 kit 0   budesonide (PULMICORT) 0.5 MG/2ML nebulizer solution Take 2 mLs (0.5 mg total) by nebulization 2 (two) times daily. 120 mL 0   budesonide-formoterol (BREYNA) 160-4.5 MCG/ACT inhaler Inhale 2 puffs into the lungs in the morning and at bedtime. (Patient not taking: Reported on 08/30/2023) 10.3 g 4   fluticasone-salmeterol (ADVAIR HFA) 230-21 MCG/ACT inhaler Inhale 2 puffs into the lungs 2 (two) times daily. (Patient not taking: Reported on 08/30/2023) 12 g 12   glucose blood (TRUE METRIX  BLOOD GLUCOSE TEST) test strip Use as instructed-fasting before breakfast; 2 hours after largest meal of the day and before bedtime (Patient not taking: Reported on 08/30/2023) 100 each 12   hydrochlorothiazide (HYDRODIURIL) 12.5 MG tablet Take 1 tablet (12.5 mg total) by mouth daily. Only on Thursday, Saturday, and Monday. (Patient not taking: Reported on 08/30/2023) 6 tablet 0   ipratropium-albuterol (DUONEB) 0.5-2.5 (3) MG/3ML SOLN Take 3 mLs by nebulization every 4 (four) hours as needed. (Patient not taking: Reported on 08/30/2023) 360 mL 0   nicotine (NICODERM CQ - DOSED IN MG/24 HOURS) 21 mg/24hr patch Place 1 patch (21 mg total) onto the skin daily. (Patient not taking: Reported on 08/30/2023) 28 patch 0   senna-docusate (SENOKOT-S) 8.6-50 MG tablet Take 1 tablet by mouth at bedtime as  needed for moderate constipation. (Patient not taking: Reported on 08/30/2023) 30 tablet 0   tiotropium (SPIRIVA HANDIHALER) 18 MCG inhalation capsule Place 1 capsule (18 mcg total) into inhaler and inhale daily. (Patient not taking: Reported on 08/30/2023) 30 capsule 12   TRUEPLUS LANCETS 28G MISC Use when checking blood sugar fasting, at bedtime and about 2 hours after largest meal of the day (Patient not taking: Reported on 08/30/2023) 100 each 6   No current facility-administered medications for this visit.    No Known Allergies   REVIEW OF SYSTEMS:  *** [X]  denotes positive finding, [ ]  denotes negative finding Cardiac  Comments:  Chest pain or chest pressure:    Shortness of breath upon exertion:    Short of breath when lying flat:    Irregular heart rhythm:        Vascular    Pain in calf, thigh, or hip brought on by ambulation:    Pain in feet at night that wakes you up from your sleep:     Blood clot in your veins:    Leg swelling:         Pulmonary    Oxygen at home:    Productive cough:     Wheezing:         Neurologic    Sudden weakness in arms or legs:     Sudden numbness in arms or legs:     Sudden onset of difficulty speaking or slurred speech:    Temporary loss of vision in one eye:     Problems with dizziness:         Gastrointestinal    Blood in stool:     Vomited blood:         Genitourinary    Burning when urinating:     Blood in urine:        Psychiatric    Major depression:         Hematologic    Bleeding problems:    Problems with blood clotting too easily:        Skin    Rashes or ulcers:        Constitutional    Fever or chills:      PHYSICAL EXAMINATION:  Vitals:   08/30/23 1146  BP: 98/65  Pulse: 83  Temp: 98.2 F (36.8 C)  TempSrc: Temporal  SpO2: (!) 88%  Weight: 126 lb 4.8 oz (57.3 kg)  Height: 6' (1.829 m)    General:  WDWN in NAD; vital signs documented above Gait: Not observed HENT: WNL,  normocephalic Pulmonary: normal non-labored breathing , without wheezing Cardiac: {Desc; regular/irreg:14544} HR Abdomen: soft, NT, no masses Skin: {With/Without:20273} rashes Vascular Exam/Pulses:  Right Left  Radial {Exam; arterial pulse strength 0-4:30167} {Exam; arterial pulse strength 0-4:30167}  Ulnar {Exam; arterial pulse strength 0-4:30167} {Exam; arterial pulse strength 0-4:30167}  Femoral {Exam; arterial pulse strength 0-4:30167} {Exam; arterial pulse strength 0-4:30167}  Popliteal {Exam; arterial pulse strength 0-4:30167} {Exam; arterial pulse strength 0-4:30167}  DP {Exam; arterial pulse strength 0-4:30167} {Exam; arterial pulse strength 0-4:30167}  PT {Exam; arterial pulse strength 0-4:30167} {Exam; arterial pulse strength 0-4:30167}   Extremities: {With/Without:20273} ischemic changes, {With/Without:20273} Gangrene , {With/Without:20273} cellulitis; {With/Without:20273} open wounds;  Musculoskeletal: no muscle wasting or atrophy  Neurologic: A&O X 3;  No focal weakness or paresthesias are detected Psychiatric:  The pt has {Desc; normal/abnormal:11317::"Normal"} affect.   Non-Invasive Vascular Imaging:   ***    ASSESSMENT/PLAN: [name] is a 62 y.o. male presenting with ***   ***   Victorino Sparrow, MD [facility] [phone]

## 2023-09-04 ENCOUNTER — Telehealth: Payer: Self-pay

## 2023-09-04 NOTE — Telephone Encounter (Signed)
Pt's mother, [name], called asking for an update on pt's last office visit and testing.  Reviewed pt's chart, returned call for clarification, two identifiers used. Pt's mother, Darlyne Russian, is on pt's DPR. Reviewed ABI results stating that they were normal and the POC is PRN unless pt develops any non-healing foot wounds, as per Dr. Kathreen Cornfield OV note. Informed her that information was forwarded to pt's PCP as well. Confirmed understanding.

## 2023-09-14 DIAGNOSIS — J209 Acute bronchitis, unspecified: Secondary | ICD-10-CM | POA: Diagnosis not present

## 2023-09-14 DIAGNOSIS — J189 Pneumonia, unspecified organism: Secondary | ICD-10-CM | POA: Diagnosis not present

## 2023-09-14 DIAGNOSIS — J449 Chronic obstructive pulmonary disease, unspecified: Secondary | ICD-10-CM | POA: Diagnosis not present

## 2023-09-17 ENCOUNTER — Ambulatory Visit: Payer: Medicaid Other | Admitting: Internal Medicine

## 2023-09-18 ENCOUNTER — Ambulatory Visit: Payer: Medicaid Other | Attending: Internal Medicine | Admitting: Internal Medicine

## 2023-09-18 ENCOUNTER — Encounter: Payer: Self-pay | Admitting: Internal Medicine

## 2023-09-18 ENCOUNTER — Other Ambulatory Visit: Payer: Self-pay

## 2023-09-18 ENCOUNTER — Other Ambulatory Visit (HOSPITAL_COMMUNITY): Payer: Self-pay

## 2023-09-18 VITALS — BP 121/80 | HR 80 | Temp 97.9°F | Ht 72.0 in | Wt 127.0 lb

## 2023-09-18 DIAGNOSIS — Z23 Encounter for immunization: Secondary | ICD-10-CM | POA: Diagnosis not present

## 2023-09-18 DIAGNOSIS — K861 Other chronic pancreatitis: Secondary | ICD-10-CM

## 2023-09-18 DIAGNOSIS — E119 Type 2 diabetes mellitus without complications: Secondary | ICD-10-CM

## 2023-09-18 DIAGNOSIS — E1169 Type 2 diabetes mellitus with other specified complication: Secondary | ICD-10-CM | POA: Diagnosis not present

## 2023-09-18 DIAGNOSIS — Z1211 Encounter for screening for malignant neoplasm of colon: Secondary | ICD-10-CM

## 2023-09-18 DIAGNOSIS — J449 Chronic obstructive pulmonary disease, unspecified: Secondary | ICD-10-CM

## 2023-09-18 DIAGNOSIS — Z9981 Dependence on supplemental oxygen: Secondary | ICD-10-CM

## 2023-09-18 DIAGNOSIS — Z7984 Long term (current) use of oral hypoglycemic drugs: Secondary | ICD-10-CM

## 2023-09-18 DIAGNOSIS — E46 Unspecified protein-calorie malnutrition: Secondary | ICD-10-CM

## 2023-09-18 DIAGNOSIS — R7989 Other specified abnormal findings of blood chemistry: Secondary | ICD-10-CM | POA: Diagnosis not present

## 2023-09-18 DIAGNOSIS — J9611 Chronic respiratory failure with hypoxia: Secondary | ICD-10-CM | POA: Diagnosis not present

## 2023-09-18 DIAGNOSIS — F17200 Nicotine dependence, unspecified, uncomplicated: Secondary | ICD-10-CM | POA: Diagnosis not present

## 2023-09-18 LAB — POCT GLYCOSYLATED HEMOGLOBIN (HGB A1C): HbA1c, POC (controlled diabetic range): 8 % — AB (ref 0.0–7.0)

## 2023-09-18 LAB — GLUCOSE, POCT (MANUAL RESULT ENTRY): POC Glucose: 208 mg/dL — AB (ref 70–99)

## 2023-09-18 MED ORDER — TIOTROPIUM BROMIDE MONOHYDRATE 18 MCG IN CAPS
18.0000 ug | ORAL_CAPSULE | Freq: Every day | RESPIRATORY_TRACT | 12 refills | Status: DC
  Filled 2023-09-18 (×2): qty 30, 30d supply, fill #0

## 2023-09-18 MED ORDER — IPRATROPIUM-ALBUTEROL 0.5-2.5 (3) MG/3ML IN SOLN
3.0000 mL | RESPIRATORY_TRACT | 1 refills | Status: DC | PRN
  Filled 2023-09-18 (×2): qty 360, 20d supply, fill #0

## 2023-09-18 MED ORDER — GLIMEPIRIDE 1 MG PO TABS
1.0000 mg | ORAL_TABLET | Freq: Every day | ORAL | 1 refills | Status: DC
Start: 2023-09-18 — End: 2023-11-28
  Filled 2023-09-18 (×2): qty 90, 90d supply, fill #0

## 2023-09-18 MED ORDER — METFORMIN HCL 1000 MG PO TABS
1000.0000 mg | ORAL_TABLET | Freq: Two times a day (BID) | ORAL | 0 refills | Status: DC
  Filled 2023-09-18 (×2): qty 180, 90d supply, fill #0

## 2023-09-18 MED ORDER — BUDESONIDE-FORMOTEROL FUMARATE 160-4.5 MCG/ACT IN AERO
2.0000 | INHALATION_SPRAY | Freq: Two times a day (BID) | RESPIRATORY_TRACT | 6 refills | Status: DC
  Filled 2023-09-18 (×2): qty 10.2, 30d supply, fill #0

## 2023-09-18 NOTE — Progress Notes (Signed)
Patient ID: [name], male    DOB: December 24, 1960  MRN: [id number]  CC: Diabetes (DM f/u. Med refill. /Discuss medications - unsure which meds pt should take/Flu vax administered on 09/18/2023 - C.A)   Subjective: [name] is a 62 y.o. male who presents for chronic ds management. Mom, Roselind Rily, is with him.  His concerns today include:  Patient with history of, diabetes, HL, pancreatitis, ETOH abuse, tob dep COPD.       Did not bring meds with him.  He tells me the only medications that he takes his metformin and his nebulizer treatment.  DM: Results for orders placed or performed in visit on 09/18/23  POCT glycosylated hemoglobin (Hb A1C)  Result Value Ref Range   Hemoglobin A1C     HbA1c POC (<> result, manual entry)     HbA1c, POC (prediabetic range)     HbA1c, POC (controlled diabetic range) 8.0 (A) 0.0 - 7.0 %  POCT glucose (manual entry)  Result Value Ref Range   POC Glucose 208 (A) 70 - 99 mg/dl  Taking Metformin 1 gram BID consistently.  Does not want device to check BS and CGM Reports eating is fine, 2-3 meals a day.  Wgh on last visit was 121 lbs.  Today is 127 lbs; improved but remains underweight for height. Albumin was 2.5 on last chem done in July.    TSH level was mildly low on hospitalization in July.  We were supposed to repeat this level. Remains free of ETOH   Hx of pancreatitis in past.  No recent flare of pancreatitis. HTN: was on hydrochlorothiazide 12.5 mg on last visit.  Reports he is not taking this med.  Only has Metformin at home. Marland Kitchen    COPD/hypoxia/Tob dep: On last visit, we left him on Breyna inhaler and nebulizer treatments for Brovana, Pulmicort and DuoNeb.  Today he tells me that he does not have any inhalers.  He saw the pulmonologist Dr. Tonia Brooms 06/2023.  He repeated the CAT scan of his chest.  This showed improvement but much of the patchy consolidation in both lungs seen on previous CAT scan had resolved.  Residual small nodular focus  of consolidation remained in the right middle lobe but decreased in size from previous.  He also had other changes which radiologist stated represent an evolving nodular scarring however neoplasm is not entirely excluded.  Dr. Tonia Brooms has ordered a repeat CT scan to be done 12/2023.  PFTs will also be done at that time.  He prescribed Spiriva and Advair inhalers.  However patient tells me today that he does not have any inhalers and was not aware that prescriptions were sent.  Using neb once a day.   Sleep with O2 on at nights at 2 L.  Sleeps better with it.  Not having to use during the day Still smoking.  Not ready to quit.   Referred to vascular surgeon for possible PAD by the pulmonologist.  He saw Dr. Sherral Hammers 08/30/2023.  ABIs were normal.  Primary prevention methods discussed including smoking cessation.  Mother states that he has not received his Medicaid card as yet.  HM:  yes to flu vaccine.  Prefers shingles on next visit.  Due for diabetic eye exam and colon cancer screening.  Prefers to do Cologuard test. Patient Active Problem List   Diagnosis Date Noted   Acute bronchitis 05/29/2023   CAP (community acquired pneumonia) 05/28/2023   Very severe alcohol intoxication with complication (HCC)  07/11/2022   Acute on chronic respiratory failure with hypoxia and hypercapnia (HCC) 07/11/2022   Acute encephalopathy 07/11/2022   Type 2 diabetes mellitus with hyperglycemia (HCC) 02/07/2022   COVID-19 vaccine series completed 12/09/2020   Influenza vaccination declined 12/09/2020   Drinking binge 12/09/2020   Hypertension 11/04/2013   Blurry vision, bilateral 11/04/2013   Chronic pancreatitis, unspecified pancreatitis type (HCC) 02/09/2012   Alcohol abuse 02/09/2012   TOBACCO ABUSE 10/19/2010   COPD (chronic obstructive pulmonary disease) (HCC) 10/19/2010   PANCREATITIS, ACUTE 10/19/2010     Current Outpatient Medications on File Prior to Visit  Medication Sig Dispense Refill   budesonide  (PULMICORT) 0.5 MG/2ML nebulizer solution Take 2 mLs (0.5 mg total) by nebulization 2 (two) times daily. 120 mL 0   No current facility-administered medications on file prior to visit.    No Known Allergies  Social History   Socioeconomic History   Marital status: Single    Spouse name: Not on file   Number of children: Not on file   Years of education: Not on file   Highest education level: Not on file  Occupational History   Not on file  Tobacco Use   Smoking status: Every Day    Current packs/day: 1.00    Average packs/day: 1 pack/day for 35.0 years (35.0 ttl pk-yrs)    Types: Cigarettes   Smokeless tobacco: Never   Tobacco comments:    Smokes 7 packs of cigarettes in a week. 07/09/2023 Tay  Substance and Sexual Activity   Alcohol use: Yes    Comment: occassionally    Drug use: No   Sexual activity: Not Currently  Other Topics Concern   Not on file  Social History Narrative   Not on file   Social Determinants of Health   Financial Resource Strain: Low Risk  (09/18/2023)   Overall Financial Resource Strain (CARDIA)    Difficulty of Paying Living Expenses: Not hard at all  Food Insecurity: No Food Insecurity (09/18/2023)   Hunger Vital Sign    Worried About Running Out of Food in the Last Year: Never true    Ran Out of Food in the Last Year: Never true  Transportation Needs: No Transportation Needs (09/18/2023)   PRAPARE - Administrator, Civil Service (Medical): No    Lack of Transportation (Non-Medical): No  Physical Activity: Inactive (09/18/2023)   Exercise Vital Sign    Days of Exercise per Week: 0 days    Minutes of Exercise per Session: 0 min  Stress: No Stress Concern Present (09/18/2023)   [employer] of Occupational Health - Occupational Stress Questionnaire    Feeling of Stress : Not at all  Social Connections: Moderately Integrated (09/18/2023)   Social Connection and Isolation Panel [NHANES]    Frequency of Communication with Friends  and Family: More than three times a week    Frequency of Social Gatherings with Friends and Family: More than three times a week    Attends Religious Services: 1 to 4 times per year    Active Member of [employer] or Organizations: Yes    Attends Banker Meetings: More than 4 times per year    Marital Status: Never married  Intimate Partner Violence: Not At Risk (09/18/2023)   Humiliation, Afraid, Rape, and Kick questionnaire    Fear of Current or Ex-Partner: No    Emotionally Abused: No    Physically Abused: No    Sexually Abused: No    Family History  Problem Relation Age of Onset   Diabetes Sister     Past Surgical History:  Procedure Laterality Date   NO PAST SURGERIES      ROS: Review of Systems Negative except as stated above  PHYSICAL EXAM: BP 121/80 (BP Location: Left Arm, Patient Position: Sitting, Cuff Size: Normal)   Pulse 80   Temp 97.9 F (36.6 C) (Oral)   Ht 6' (1.829 m)   Wt 127 lb (57.6 kg)   SpO2 95%   BMI 17.22 kg/m   Wt Readings from Last 3 Encounters:  09/18/23 127 lb (57.6 kg)  08/30/23 126 lb 4.8 oz (57.3 kg)  07/09/23 128 lb 12.8 oz (58.4 kg)  Patient does not have portable oxygen with him.  Pulse ox of 95% is on room air.  Physical Exam   General appearance -older Caucasian male who appears significantly underweight for height.  He is in NAD.  Difficult to get a history as patient and his mother talks about different aspects of his health at the same time Mental status -patient answers questions appropriately. Neck - supple, no significant adenopathy Chest -breath sounds mildly decreased but without wheezes or crackles at this time. Heart - normal rate, regular rhythm, normal S1, S2, no murmurs, rubs, clicks or gallops Extremities -trace lower extremity edema.  Rubor appearance to the lower legs.     Latest Ref Rng & Units 06/01/2023    3:26 AM 05/31/2023    1:20 AM 05/30/2023    3:40 AM  CMP  Glucose 70 - 99 mg/dL 161  096  045    BUN 8 - 23 mg/dL 9  8  5    Creatinine 0.61 - 1.24 mg/dL 4.09  8.11  9.14   Sodium 135 - 145 mmol/L 135  132  132   Potassium 3.5 - 5.1 mmol/L 4.2  3.5  3.9   Chloride 98 - 111 mmol/L 104  93  94   CO2 22 - 32 mmol/L 22  34  33   Calcium 8.9 - 10.3 mg/dL 8.8  8.0  7.9    Lipid Panel     Component Value Date/Time   CHOL 89 05/29/2023 0305   CHOL 120 02/07/2022 0939   TRIG 34 05/29/2023 0305   HDL 40 (L) 05/29/2023 0305   HDL 58 02/07/2022 0939   CHOLHDL 2.2 05/29/2023 0305   VLDL 7 05/29/2023 0305   LDLCALC 42 05/29/2023 0305   LDLCALC 50 02/07/2022 0939    CBC    Component Value Date/Time   WBC 16.2 (H) 05/31/2023 0120   RBC 3.95 (L) 05/31/2023 0120   HGB 12.2 (L) 05/31/2023 0120   HGB 12.3 (L) 02/07/2022 0939   HCT 38.4 (L) 05/31/2023 0120   HCT 35.2 (L) 02/07/2022 0939   PLT 169 05/31/2023 0120   PLT 220 02/07/2022 0939   MCV 97.2 05/31/2023 0120   MCV 100 (H) 02/07/2022 0939   MCH 30.9 05/31/2023 0120   MCHC 31.8 05/31/2023 0120   RDW 15.6 (H) 05/31/2023 0120   RDW 13.1 02/07/2022 0939   LYMPHSABS 0.8 05/28/2023 1110   LYMPHSABS 2.2 07/22/2020 1030   MONOABS 0.8 05/28/2023 1110   EOSABS 0.0 05/28/2023 1110   EOSABS 0.2 07/22/2020 1030   BASOSABS 0.1 05/28/2023 1110   BASOSABS 0.1 07/22/2020 1030    ASSESSMENT AND PLAN: 1. Type 2 diabetes mellitus with other specified complication, without long-term current use of insulin (HCC) Not at goal. Continue Metformin 1 gram BID. Add low dose  Amaryl.   Recommend checking BS.  He declines manual glucometer and CGM - POCT glycosylated hemoglobin (Hb A1C) - POCT glucose (manual entry) - metFORMIN (GLUCOPHAGE) 1000 MG tablet; Take 1 tablet (1,000 mg total) by mouth 2 (two) times daily with a meal.  Dispense: 180 tablet; Refill: 0 - glimepiride (AMARYL) 1 MG tablet; Take 1 tablet (1 mg total) by mouth daily with breakfast.  Dispense: 90 tablet; Refill: 1 - Ambulatory referral to Ophthalmology - Microalbumin / creatinine  urine ratio - Comprehensive metabolic panel  2. Diabetes mellitus treated with oral medication (HCC) See above  3. Chronic obstructive pulmonary disease, unspecified COPD type (HCC) - tiotropium (SPIRIVA HANDIHALER) 18 MCG inhalation capsule; Place 1 capsule (18 mcg total) into inhaler and inhale daily.  Dispense: 30 capsule; Refill: 12 - budesonide-formoterol (BREYNA) 160-4.5 MCG/ACT inhaler; Inhale 2 puffs into the lungs in the morning and at bedtime.  Dispense: 10.3 g; Refill: 6 - ipratropium-albuterol (DUONEB) 0.5-2.5 (3) MG/3ML SOLN; Take 3 mLs by nebulization every 4 (four) hours as needed.  Dispense: 360 mL; Refill: 1  4. Chronic respiratory failure with hypoxia, on home oxygen therapy (HCC) Better compared to when I saw him in July.  Now only having to use O2 at nights.  Imaging study of the lungs also shows some clearing of previous findings.  5. Tobacco dependence Strongly advised to quit smoking.  He is not ready to give a trial of quitting.  6. Protein malnutrition (HCC) It is good that he has gained some weight since last visit but still remains underweight for height.  He will continue to eat 3 solid meals a day.  7. Abnormal thyroid blood test - TSH+T4F+T3Free  8. Chronic pancreatitis, unspecified pancreatitis type (HCC) Stable without any recent recurrence.  9. Encounter for immunization - Flu Vaccine Trivalent High Dose (Fluad)  10. Screening for colon cancer Patient declines colonoscopy but agrees to Cologuard test. - Cologuard    Patient was given the opportunity to ask questions.  Patient verbalized understanding of the plan and was able to repeat key elements of the plan.   This documentation was completed using Paediatric nurse.  Any transcriptional errors are unintentional.  Orders Placed This Encounter  Procedures   Flu Vaccine Trivalent High Dose (Fluad)   Cologuard   Microalbumin / creatinine urine ratio   TSH+T4F+T3Free    Comprehensive metabolic panel   Ambulatory referral to Ophthalmology   POCT glycosylated hemoglobin (Hb A1C)   POCT glucose (manual entry)     Requested Prescriptions   Signed Prescriptions Disp Refills   metFORMIN (GLUCOPHAGE) 1000 MG tablet 180 tablet 0    Sig: Take 1 tablet (1,000 mg total) by mouth 2 (two) times daily with a meal.   glimepiride (AMARYL) 1 MG tablet 90 tablet 1    Sig: Take 1 tablet (1 mg total) by mouth daily with breakfast.   tiotropium (SPIRIVA HANDIHALER) 18 MCG inhalation capsule 30 capsule 12    Sig: Place 1 capsule (18 mcg total) into inhaler and inhale daily.   budesonide-formoterol (BREYNA) 160-4.5 MCG/ACT inhaler 10.3 g 6    Sig: Inhale 2 puffs into the lungs in the morning and at bedtime.   ipratropium-albuterol (DUONEB) 0.5-2.5 (3) MG/3ML SOLN 360 mL 1    Sig: Take 3 mLs by nebulization every 4 (four) hours as needed.    Return in about 4 months (around 01/16/2024).  Jonah Blue, MD, FACP

## 2023-09-21 LAB — COMPREHENSIVE METABOLIC PANEL
ALT: 18 [IU]/L (ref 0–44)
AST: 21 [IU]/L (ref 0–40)
Albumin: 4.2 g/dL (ref 3.9–4.9)
Alkaline Phosphatase: 89 [IU]/L (ref 44–121)
BUN/Creatinine Ratio: 17 (ref 10–24)
BUN: 9 mg/dL (ref 8–27)
Bilirubin Total: 0.5 mg/dL (ref 0.0–1.2)
CO2: 27 mmol/L (ref 20–29)
Calcium: 9.2 mg/dL (ref 8.6–10.2)
Chloride: 84 mmol/L — ABNORMAL LOW (ref 96–106)
Creatinine, Ser: 0.52 mg/dL — ABNORMAL LOW (ref 0.76–1.27)
Globulin, Total: 1.9 g/dL (ref 1.5–4.5)
Glucose: 157 mg/dL — ABNORMAL HIGH (ref 70–99)
Potassium: 5.1 mmol/L (ref 3.5–5.2)
Sodium: 125 mmol/L — ABNORMAL LOW (ref 134–144)
Total Protein: 6.1 g/dL (ref 6.0–8.5)
eGFR: 113 mL/min/{1.73_m2} (ref >59)

## 2023-09-21 LAB — MICROALBUMIN / CREATININE URINE RATIO
Creatinine, Urine: 98.2 mg/dL
Microalb/Creat Ratio: 25 mg/g{creat} (ref 0–29)
Microalbumin, Urine: 24.3 ug/mL

## 2023-09-21 LAB — TSH+T4F+T3FREE
Free T4: 1.54 ng/dL (ref 0.82–1.77)
T3, Free: 3.1 pg/mL (ref 2.0–4.4)
TSH: 1.47 u[IU]/mL (ref 0.450–4.500)

## 2023-09-22 ENCOUNTER — Telehealth: Payer: Self-pay | Admitting: Internal Medicine

## 2023-09-22 DIAGNOSIS — E871 Hypo-osmolality and hyponatremia: Secondary | ICD-10-CM

## 2023-09-22 NOTE — Telephone Encounter (Signed)
PC placed to pt this evening to go over lab results. Pt informed that sodium level is low.  Pt reported on visit this wk that he has remained free of ETOH.  I requested that he return to lab next wk to have additional testing done to eval further.

## 2023-10-01 ENCOUNTER — Other Ambulatory Visit: Payer: Medicaid Other

## 2023-10-04 ENCOUNTER — Ambulatory Visit: Payer: Medicaid Other | Attending: Internal Medicine

## 2023-10-04 DIAGNOSIS — E871 Hypo-osmolality and hyponatremia: Secondary | ICD-10-CM

## 2023-10-07 ENCOUNTER — Other Ambulatory Visit: Payer: Self-pay | Admitting: Internal Medicine

## 2023-10-07 ENCOUNTER — Telehealth: Payer: Self-pay | Admitting: Internal Medicine

## 2023-10-07 DIAGNOSIS — E871 Hypo-osmolality and hyponatremia: Secondary | ICD-10-CM

## 2023-10-07 NOTE — Telephone Encounter (Signed)
Phone call placed to patient today to go over lab results.  I left message on the home phone informing of who I am and that I was calling to go over lab results.  I will try to reach him again later. I then called his cell phone and LVMM informing of who I am and that I was calling to go over the lab results.  Informed that his sodium level continues to decline.  We may have to send him to the emergency room but I told him I will get back to him once I get the results of the other lab results that are pending.  At this time calculated serum osmolarity and urine osmolarity are still pending.  I did submit referral for him to see a nephrologist.

## 2023-10-08 ENCOUNTER — Ambulatory Visit: Payer: Medicaid Other | Attending: Family Medicine

## 2023-10-08 ENCOUNTER — Other Ambulatory Visit: Payer: Self-pay

## 2023-10-08 DIAGNOSIS — E871 Hypo-osmolality and hyponatremia: Secondary | ICD-10-CM | POA: Diagnosis not present

## 2023-10-08 NOTE — Progress Notes (Signed)
Patient in the office today to obtain urine sample for order labs that were uncollected on 10/05/2023

## 2023-10-10 ENCOUNTER — Telehealth: Payer: Self-pay | Admitting: Internal Medicine

## 2023-10-10 LAB — BASIC METABOLIC PANEL
BUN/Creatinine Ratio: 19 (ref 10–24)
BUN: 9 mg/dL (ref 8–27)
CO2: 29 mmol/L (ref 20–29)
Calcium: 9.3 mg/dL (ref 8.6–10.2)
Chloride: 84 mmol/L — ABNORMAL LOW (ref 96–106)
Creatinine, Ser: 0.48 mg/dL — ABNORMAL LOW (ref 0.76–1.27)
Glucose: 105 mg/dL — ABNORMAL HIGH (ref 70–99)
Potassium: 4.9 mmol/L (ref 3.5–5.2)
Sodium: 124 mmol/L — ABNORMAL LOW (ref 134–144)
eGFR: 115 mL/min/{1.73_m2} (ref >59)

## 2023-10-10 LAB — OSMOLALITY, URINE: Osmolality, Ur: 679 mosm/kg

## 2023-10-10 LAB — SODIUM, URINE, RANDOM: Sodium, Ur: 106 mmol/L

## 2023-10-10 LAB — OSMOLALITY: Osmolality Meas: 252 mosm/kg — ABNORMAL LOW (ref 280–301)

## 2023-10-10 NOTE — Progress Notes (Signed)
Let pt know that I have not received the results of urine as yet to help guide management of his low sodium level. Please inquire whether he has been having any vomiting or sever diarrhea over the past week.  If so, he should increase fluid intake.  If he has not been having any vomiting/sever diarrhea, then he needs to restrict his fluid intake to no more than 1 to 1 1/2 liters a day.  Stop all alcoholic beverage intake.  Return to lab in 1-2 wks for recheck sodium level.

## 2023-10-10 NOTE — Telephone Encounter (Signed)
PC placed to pt today to discuss lab results.  I inquired whether he is on any fluid pills or had vomiting/diarrhea in the past 1 wk.  Pt said no. Also confirmed that he has not drank any ETOH beverages. Labs looking like SIADH though when pt came to have blood drawn, he was not able to give the urine sample.  Came back a few days later to give urine sample.   Advise fluid restriction to 1-1.5 liters every day for next wk then return to lab for recheck.  Pt agreeable to this. Results for orders placed or performed in visit on 10/08/23  Osmolality, urine  Result Value Ref Range   Osmolality, Ur 679 mOsmol/kg  Sodium, urine, random  Result Value Ref Range   Sodium, Ur 106 Not Estab. mmol/L

## 2023-10-14 DIAGNOSIS — J449 Chronic obstructive pulmonary disease, unspecified: Secondary | ICD-10-CM | POA: Diagnosis not present

## 2023-10-14 DIAGNOSIS — J189 Pneumonia, unspecified organism: Secondary | ICD-10-CM | POA: Diagnosis not present

## 2023-10-14 DIAGNOSIS — J209 Acute bronchitis, unspecified: Secondary | ICD-10-CM | POA: Diagnosis not present

## 2023-10-29 ENCOUNTER — Other Ambulatory Visit: Payer: Self-pay | Admitting: Internal Medicine

## 2023-10-29 ENCOUNTER — Ambulatory Visit: Payer: Medicaid Other | Attending: Family Medicine

## 2023-10-29 DIAGNOSIS — E871 Hypo-osmolality and hyponatremia: Secondary | ICD-10-CM

## 2023-10-31 ENCOUNTER — Telehealth: Payer: Self-pay | Admitting: Internal Medicine

## 2023-10-31 DIAGNOSIS — E875 Hyperkalemia: Secondary | ICD-10-CM

## 2023-10-31 NOTE — Telephone Encounter (Signed)
Phone call placed to patient today to discuss results of her recent blood test.  Cerebral osmolarity is still pending.  However chemistry revealed that his sodium level is improving with the fluid restriction.  Level is up to 130.  Previously it was 124.  However potassium is elevated at 5.5.  He is not on any potassium supplement.  I recommend that he returns to the lab in 1 week for repeat potassium level check.  Patient was asking about whether he needs to do the 24 hour urine collection.  Advise that he we do not need to at this pt.  Please disregard order.

## 2023-11-02 LAB — BASIC METABOLIC PANEL
BUN/Creatinine Ratio: 19 (ref 10–24)
BUN: 10 mg/dL (ref 8–27)
CO2: 31 mmol/L — ABNORMAL HIGH (ref 20–29)
Calcium: 9.3 mg/dL (ref 8.6–10.2)
Chloride: 85 mmol/L — ABNORMAL LOW (ref 96–106)
Creatinine, Ser: 0.53 mg/dL — ABNORMAL LOW (ref 0.76–1.27)
Glucose: 67 mg/dL — ABNORMAL LOW (ref 70–99)
Potassium: 5.5 mmol/L — ABNORMAL HIGH (ref 3.5–5.2)
Sodium: 130 mmol/L — ABNORMAL LOW (ref 134–144)
eGFR: 112 mL/min/{1.73_m2} (ref >59)

## 2023-11-02 LAB — OSMOLALITY: Osmolality Meas: 258 mosm/kg — ABNORMAL LOW (ref 280–301)

## 2023-11-14 DIAGNOSIS — J449 Chronic obstructive pulmonary disease, unspecified: Secondary | ICD-10-CM | POA: Diagnosis not present

## 2023-11-14 DIAGNOSIS — J189 Pneumonia, unspecified organism: Secondary | ICD-10-CM | POA: Diagnosis not present

## 2023-11-14 DIAGNOSIS — J209 Acute bronchitis, unspecified: Secondary | ICD-10-CM | POA: Diagnosis not present

## 2023-11-19 ENCOUNTER — Emergency Department (HOSPITAL_COMMUNITY): Payer: Medicaid Other

## 2023-11-19 ENCOUNTER — Other Ambulatory Visit: Payer: Self-pay

## 2023-11-19 ENCOUNTER — Encounter (HOSPITAL_COMMUNITY): Payer: Self-pay | Admitting: Emergency Medicine

## 2023-11-19 ENCOUNTER — Inpatient Hospital Stay (HOSPITAL_COMMUNITY)
Admission: EM | Admit: 2023-11-19 | Discharge: 2023-12-15 | DRG: 870 | Disposition: E | Payer: Medicaid Other | Attending: Internal Medicine | Admitting: Internal Medicine

## 2023-11-19 DIAGNOSIS — R7881 Bacteremia: Secondary | ICD-10-CM

## 2023-11-19 DIAGNOSIS — K122 Cellulitis and abscess of mouth: Secondary | ICD-10-CM | POA: Insufficient documentation

## 2023-11-19 DIAGNOSIS — A4181 Sepsis due to Enterococcus: Principal | ICD-10-CM | POA: Diagnosis present

## 2023-11-19 DIAGNOSIS — R7989 Other specified abnormal findings of blood chemistry: Secondary | ICD-10-CM | POA: Diagnosis present

## 2023-11-19 DIAGNOSIS — B965 Pseudomonas (aeruginosa) (mallei) (pseudomallei) as the cause of diseases classified elsewhere: Secondary | ICD-10-CM | POA: Diagnosis not present

## 2023-11-19 DIAGNOSIS — R791 Abnormal coagulation profile: Secondary | ICD-10-CM | POA: Insufficient documentation

## 2023-11-19 DIAGNOSIS — K8681 Exocrine pancreatic insufficiency: Secondary | ICD-10-CM | POA: Diagnosis present

## 2023-11-19 DIAGNOSIS — J690 Pneumonitis due to inhalation of food and vomit: Secondary | ICD-10-CM | POA: Diagnosis present

## 2023-11-19 DIAGNOSIS — Z1159 Encounter for screening for other viral diseases: Secondary | ICD-10-CM

## 2023-11-19 DIAGNOSIS — L0211 Cutaneous abscess of neck: Secondary | ICD-10-CM | POA: Diagnosis present

## 2023-11-19 DIAGNOSIS — J9622 Acute and chronic respiratory failure with hypercapnia: Secondary | ICD-10-CM | POA: Diagnosis present

## 2023-11-19 DIAGNOSIS — K861 Other chronic pancreatitis: Secondary | ICD-10-CM | POA: Diagnosis present

## 2023-11-19 DIAGNOSIS — E785 Hyperlipidemia, unspecified: Secondary | ICD-10-CM | POA: Diagnosis present

## 2023-11-19 DIAGNOSIS — R627 Adult failure to thrive: Secondary | ICD-10-CM | POA: Diagnosis present

## 2023-11-19 DIAGNOSIS — Z716 Tobacco abuse counseling: Secondary | ICD-10-CM

## 2023-11-19 DIAGNOSIS — R680 Hypothermia, not associated with low environmental temperature: Secondary | ICD-10-CM | POA: Diagnosis present

## 2023-11-19 DIAGNOSIS — J441 Chronic obstructive pulmonary disease with (acute) exacerbation: Secondary | ICD-10-CM | POA: Diagnosis present

## 2023-11-19 DIAGNOSIS — E872 Acidosis, unspecified: Secondary | ICD-10-CM | POA: Diagnosis present

## 2023-11-19 DIAGNOSIS — Z515 Encounter for palliative care: Secondary | ICD-10-CM

## 2023-11-19 DIAGNOSIS — R0902 Hypoxemia: Secondary | ICD-10-CM | POA: Diagnosis not present

## 2023-11-19 DIAGNOSIS — D638 Anemia in other chronic diseases classified elsewhere: Secondary | ICD-10-CM | POA: Diagnosis present

## 2023-11-19 DIAGNOSIS — Z1152 Encounter for screening for COVID-19: Secondary | ICD-10-CM

## 2023-11-19 DIAGNOSIS — K047 Periapical abscess without sinus: Secondary | ICD-10-CM

## 2023-11-19 DIAGNOSIS — G9341 Metabolic encephalopathy: Secondary | ICD-10-CM | POA: Diagnosis present

## 2023-11-19 DIAGNOSIS — I5031 Acute diastolic (congestive) heart failure: Secondary | ICD-10-CM | POA: Diagnosis not present

## 2023-11-19 DIAGNOSIS — A419 Sepsis, unspecified organism: Secondary | ICD-10-CM | POA: Diagnosis present

## 2023-11-19 DIAGNOSIS — R6521 Severe sepsis with septic shock: Secondary | ICD-10-CM | POA: Diagnosis not present

## 2023-11-19 DIAGNOSIS — F17210 Nicotine dependence, cigarettes, uncomplicated: Secondary | ICD-10-CM | POA: Diagnosis present

## 2023-11-19 DIAGNOSIS — Z8701 Personal history of pneumonia (recurrent): Secondary | ICD-10-CM

## 2023-11-19 DIAGNOSIS — J189 Pneumonia, unspecified organism: Secondary | ICD-10-CM | POA: Diagnosis present

## 2023-11-19 DIAGNOSIS — G4489 Other headache syndrome: Secondary | ICD-10-CM | POA: Diagnosis not present

## 2023-11-19 DIAGNOSIS — R0603 Acute respiratory distress: Secondary | ICD-10-CM | POA: Diagnosis not present

## 2023-11-19 DIAGNOSIS — F17200 Nicotine dependence, unspecified, uncomplicated: Secondary | ICD-10-CM | POA: Diagnosis present

## 2023-11-19 DIAGNOSIS — I110 Hypertensive heart disease with heart failure: Secondary | ICD-10-CM | POA: Diagnosis present

## 2023-11-19 DIAGNOSIS — E871 Hypo-osmolality and hyponatremia: Secondary | ICD-10-CM | POA: Diagnosis present

## 2023-11-19 DIAGNOSIS — F1020 Alcohol dependence, uncomplicated: Secondary | ICD-10-CM | POA: Diagnosis present

## 2023-11-19 DIAGNOSIS — Z681 Body mass index (BMI) 19 or less, adult: Secondary | ICD-10-CM

## 2023-11-19 DIAGNOSIS — Z7951 Long term (current) use of inhaled steroids: Secondary | ICD-10-CM

## 2023-11-19 DIAGNOSIS — E875 Hyperkalemia: Secondary | ICD-10-CM | POA: Diagnosis present

## 2023-11-19 DIAGNOSIS — E43 Unspecified severe protein-calorie malnutrition: Secondary | ICD-10-CM | POA: Insufficient documentation

## 2023-11-19 DIAGNOSIS — F1010 Alcohol abuse, uncomplicated: Secondary | ICD-10-CM | POA: Diagnosis present

## 2023-11-19 DIAGNOSIS — K029 Dental caries, unspecified: Secondary | ICD-10-CM | POA: Diagnosis present

## 2023-11-19 DIAGNOSIS — Z7984 Long term (current) use of oral hypoglycemic drugs: Secondary | ICD-10-CM

## 2023-11-19 DIAGNOSIS — Z833 Family history of diabetes mellitus: Secondary | ICD-10-CM

## 2023-11-19 DIAGNOSIS — J80 Acute respiratory distress syndrome: Secondary | ICD-10-CM | POA: Diagnosis not present

## 2023-11-19 DIAGNOSIS — D696 Thrombocytopenia, unspecified: Secondary | ICD-10-CM | POA: Diagnosis not present

## 2023-11-19 DIAGNOSIS — J9621 Acute and chronic respiratory failure with hypoxia: Secondary | ICD-10-CM | POA: Diagnosis present

## 2023-11-19 DIAGNOSIS — E1165 Type 2 diabetes mellitus with hyperglycemia: Secondary | ICD-10-CM | POA: Diagnosis present

## 2023-11-19 DIAGNOSIS — Z781 Physical restraint status: Secondary | ICD-10-CM

## 2023-11-19 DIAGNOSIS — Z9981 Dependence on supplemental oxygen: Secondary | ICD-10-CM

## 2023-11-19 DIAGNOSIS — B952 Enterococcus as the cause of diseases classified elsewhere: Secondary | ICD-10-CM

## 2023-11-19 DIAGNOSIS — J168 Pneumonia due to other specified infectious organisms: Secondary | ICD-10-CM | POA: Diagnosis not present

## 2023-11-19 DIAGNOSIS — L89156 Pressure-induced deep tissue damage of sacral region: Secondary | ICD-10-CM | POA: Diagnosis not present

## 2023-11-19 DIAGNOSIS — Z66 Do not resuscitate: Secondary | ICD-10-CM | POA: Diagnosis not present

## 2023-11-19 DIAGNOSIS — R64 Cachexia: Secondary | ICD-10-CM | POA: Diagnosis present

## 2023-11-19 DIAGNOSIS — R0689 Other abnormalities of breathing: Secondary | ICD-10-CM | POA: Diagnosis not present

## 2023-11-19 DIAGNOSIS — E870 Hyperosmolality and hypernatremia: Secondary | ICD-10-CM | POA: Diagnosis not present

## 2023-11-19 DIAGNOSIS — R404 Transient alteration of awareness: Secondary | ICD-10-CM | POA: Diagnosis not present

## 2023-11-19 LAB — CBC WITH DIFFERENTIAL/PLATELET
Abs Immature Granulocytes: 0.25 10*3/uL — ABNORMAL HIGH (ref 0.00–0.07)
Basophils Absolute: 0.1 10*3/uL (ref 0.0–0.1)
Basophils Relative: 0 %
Eosinophils Absolute: 0 10*3/uL (ref 0.0–0.5)
Eosinophils Relative: 0 %
HCT: 44.3 % (ref 39.0–52.0)
Hemoglobin: 14.2 g/dL (ref 13.0–17.0)
Immature Granulocytes: 1 %
Lymphocytes Relative: 2 %
Lymphs Abs: 0.6 10*3/uL — ABNORMAL LOW (ref 0.7–4.0)
MCH: 29.6 pg (ref 26.0–34.0)
MCHC: 32.1 g/dL (ref 30.0–36.0)
MCV: 92.5 fL (ref 80.0–100.0)
Monocytes Absolute: 0.9 10*3/uL (ref 0.1–1.0)
Monocytes Relative: 4 %
Neutro Abs: 22.9 10*3/uL — ABNORMAL HIGH (ref 1.7–7.7)
Neutrophils Relative %: 93 %
Platelets: 102 10*3/uL — ABNORMAL LOW (ref 150–400)
RBC: 4.79 MIL/uL (ref 4.22–5.81)
RDW: 15.5 % (ref 11.5–15.5)
WBC: 24.7 10*3/uL — ABNORMAL HIGH (ref 4.0–10.5)
nRBC: 0 % (ref 0.0–0.2)

## 2023-11-19 LAB — COMPREHENSIVE METABOLIC PANEL
ALT: 140 U/L — ABNORMAL HIGH (ref 0–44)
AST: 213 U/L — ABNORMAL HIGH (ref 15–41)
Albumin: 2.9 g/dL — ABNORMAL LOW (ref 3.5–5.0)
Alkaline Phosphatase: 124 U/L (ref 38–126)
Anion gap: 16 — ABNORMAL HIGH (ref 5–15)
BUN: 52 mg/dL — ABNORMAL HIGH (ref 8–23)
CO2: 36 mmol/L — ABNORMAL HIGH (ref 22–32)
Calcium: 8.8 mg/dL — ABNORMAL LOW (ref 8.9–10.3)
Chloride: 84 mmol/L — ABNORMAL LOW (ref 98–111)
Creatinine, Ser: 1.14 mg/dL (ref 0.61–1.24)
GFR, Estimated: >60 mL/min (ref >60)
Glucose, Bld: 116 mg/dL — ABNORMAL HIGH (ref 70–99)
Potassium: 5.5 mmol/L — ABNORMAL HIGH (ref 3.5–5.1)
Sodium: 136 mmol/L (ref 135–145)
Total Bilirubin: 1.8 mg/dL — ABNORMAL HIGH (ref 0.0–1.2)
Total Protein: 6.2 g/dL — ABNORMAL LOW (ref 6.5–8.1)

## 2023-11-19 LAB — LACTIC ACID, PLASMA: Lactic Acid, Venous: 3.9 mmol/L (ref 0.5–1.9)

## 2023-11-19 MED ORDER — SODIUM CHLORIDE 0.9 % IV SOLN
1.0000 g | Freq: Once | INTRAVENOUS | Status: AC
  Administered 2023-11-19: 1 g via INTRAVENOUS
  Filled 2023-11-19: qty 10

## 2023-11-19 MED ORDER — ALBUTEROL SULFATE HFA 108 (90 BASE) MCG/ACT IN AERS: 2.0000 | INHALATION_SPRAY | RESPIRATORY_TRACT | Status: DC | PRN

## 2023-11-19 MED ORDER — SODIUM CHLORIDE 0.9 % IV SOLN
500.0000 mg | Freq: Once | INTRAVENOUS | Status: AC
  Administered 2023-11-19: 500 mg via INTRAVENOUS
  Filled 2023-11-19: qty 5

## 2023-11-19 NOTE — ED Provider Notes (Signed)
 [place] EMERGENCY DEPARTMENT AT [facility] Provider Note   CSN: [id number] Arrival date & time: 11/19/23  2031     History  No chief complaint on file.   [name] is a 63 y.o. male.  The history is provided by the patient and medical records. No language interpreter was used.  Shortness of Breath Severity:  Severe Onset quality:  Gradual Duration:  3 days Timing:  Constant Progression:  Worsening Chronicity:  New Context: URI   Relieved by:  Nothing Worsened by:  Coughing Ineffective treatments:  None tried Associated symptoms: cough and wheezing   Associated symptoms: no abdominal pain, no chest pain, no fever, no headaches, no neck pain, no rash, no sputum production and no vomiting   Risk factors: no hx of PE/DVT        Home Medications Prior to Admission medications   Medication Sig Start Date End Date Taking? Authorizing Provider  budesonide  (PULMICORT ) 0.5 MG/2ML nebulizer solution Take 2 mLs (0.5 mg total) by nebulization 2 (two) times daily. 06/01/23 07/12/23  Rosario Leatrice FERNS, MD  budesonide -formoterol  (BREYNA ) 160-4.5 MCG/ACT inhaler Inhale 2 puffs into the lungs in the morning and at bedtime. 09/18/23   Vicci Barnie NOVAK, MD  glimepiride  (AMARYL ) 1 MG tablet Take 1 tablet (1 mg total) by mouth daily with breakfast. 09/18/23   Vicci Barnie NOVAK, MD  ipratropium-albuterol  (DUONEB) 0.5-2.5 (3) MG/3ML SOLN Take 3 mLs by nebulization every 4 (four) hours as needed. 09/18/23   Vicci Barnie NOVAK, MD  metFORMIN  (GLUCOPHAGE ) 1000 MG tablet Take 1 tablet (1,000 mg total) by mouth 2 (two) times daily with a meal. 09/18/23   Vicci Barnie NOVAK, MD  tiotropium (SPIRIVA  HANDIHALER) 18 MCG inhalation capsule Place 1 capsule (18 mcg total) into inhaler and inhale daily. 09/18/23   Vicci Barnie NOVAK, MD      Allergies    Patient has no known allergies.    Review of Systems   Review of Systems  Constitutional:  Negative for chills, fatigue and fever.   HENT:  Negative for congestion.   Respiratory:  Positive for cough, shortness of breath and wheezing. Negative for sputum production and chest tightness.   Cardiovascular:  Negative for chest pain.  Gastrointestinal:  Negative for abdominal pain, constipation, diarrhea, nausea and vomiting.  Genitourinary:  Negative for dysuria, flank pain and frequency.  Musculoskeletal:  Negative for back pain, neck pain and neck stiffness.  Skin:  Negative for rash and wound.  Neurological:  Negative for weakness, light-headedness, numbness and headaches.  Psychiatric/Behavioral:  Negative for agitation and confusion.   All other systems reviewed and are negative.   Physical Exam Updated Vital Signs BP 134/82   Pulse 71   Temp (!) 96.1 F (35.6 C) (Temporal)   Resp (!) 30   Ht 6' (1.829 m)   Wt 59 kg   SpO2 100%   BMI 17.63 kg/m  Physical Exam Vitals and nursing note reviewed.  Constitutional:      General: He is not in acute distress.    Appearance: He is well-developed. He is not ill-appearing, toxic-appearing or diaphoretic.  HENT:     Head: Normocephalic and atraumatic.     Nose: No congestion or rhinorrhea.     Mouth/Throat:     Mouth: Mucous membranes are moist.     Pharynx: No oropharyngeal exudate or posterior oropharyngeal erythema.  Eyes:     Extraocular Movements: Extraocular movements intact.     Conjunctiva/sclera: Conjunctivae normal.  Pupils: Pupils are equal, round, and reactive to light.  Cardiovascular:     Rate and Rhythm: Normal rate and regular rhythm.     Heart sounds: No murmur heard. Pulmonary:     Effort: Pulmonary effort is normal. No respiratory distress.     Breath sounds: Wheezing, rhonchi and rales present.  Chest:     Chest wall: No tenderness.  Abdominal:     General: Abdomen is flat.     Palpations: Abdomen is soft.     Tenderness: There is no abdominal tenderness. There is no right CVA tenderness, left CVA tenderness, guarding or rebound.   Musculoskeletal:        General: No swelling or tenderness.     Cervical back: Neck supple.  Skin:    General: Skin is warm and dry.     Capillary Refill: Capillary refill takes less than 2 seconds.     Findings: No erythema or rash.  Neurological:     General: No focal deficit present.     Mental Status: He is alert.     Sensory: No sensory deficit.     Motor: No weakness.  Psychiatric:        Mood and Affect: Mood normal.     ED Results / Procedures / Treatments   Labs (all labs ordered are listed, but only abnormal results are displayed) Labs Reviewed  CBC WITH DIFFERENTIAL/PLATELET - Abnormal; Notable for the following components:      Result Value   WBC 24.7 (*)    Platelets 102 (*)    Neutro Abs 22.9 (*)    Lymphs Abs 0.6 (*)    Abs Immature Granulocytes 0.25 (*)    All other components within normal limits  LACTIC ACID, PLASMA - Abnormal; Notable for the following components:   Lactic Acid, Venous 3.9 (*)    All other components within normal limits  CULTURE, BLOOD (ROUTINE X 2)  CULTURE, BLOOD (ROUTINE X 2)  RESP PANEL BY RT-PCR (RSV, FLU A&B, COVID)  [id number]  COMPREHENSIVE METABOLIC PANEL  LACTIC ACID, PLASMA  BRAIN NATRIURETIC PEPTIDE    EKG EKG Interpretation Date/Time:  Monday November 19 2023 20:51:28 EST Ventricular Rate:  79 PR Interval:  62 QRS Duration:  128 QT Interval:  407 QTC Calculation: 464 R Axis:   80  Text Interpretation: Sinus rhythm Ventricular premature complex Aberrant conduction of SV complex(es) Short PR interval Prominent P waves, nondiagnostic Nonspecific intraventricular conduction delay Probable anteroseptal infarct, old Minimal ST depression, anterior leads ST elevation, consider inferior injury when compared to prior, more wandering baseline and aritfact. No STEMI Confirmed by Ginger Barefoot ([id number]) on 11/19/2023 10:01:42 PM  Radiology DG Chest 2 View Result Date: 11/19/2023 CLINICAL DATA:  Shortness of breath and hypoxia,  initial encounter EXAM: CHEST - 2 VIEW COMPARISON:  05/28/2023 FINDINGS: Cardiac shadow is within normal limits. Lungs are well aerated bilaterally. Patchy airspace opacity is noted in the right base laterally new from the prior exam consistent with acute infiltrate. No acute bony abnormality is noted. IMPRESSION: Right basilar infiltrate. Electronically Signed   By: Oneil Devonshire M.D.   On: 11/19/2023 21:29    Procedures Procedures    CRITICAL CARE Performed by: Lonni PARAS Nessa Ramaker Total critical care time: 45 minutes Critical care time was exclusive of separately billable procedures and treating other patients. Critical care was necessary to treat or prevent imminent or life-threatening deterioration. Critical care was time spent personally by me on the following activities: development of treatment plan  with patient and/or surrogate as well as nursing, discussions with consultants, evaluation of patient's response to treatment, examination of patient, obtaining history from patient or surrogate, ordering and performing treatments and interventions, ordering and review of laboratory studies, ordering and review of radiographic studies, pulse oximetry and re-evaluation of patient's condition.    Medications Ordered in ED Medications  albuterol  (VENTOLIN  HFA) 108 (90 Base) MCG/ACT inhaler 2 puff (has no administration in time range)  cefTRIAXone  (ROCEPHIN ) 1 g in sodium chloride  0.9 % 100 mL IVPB (has no administration in time range)  azithromycin  (ZITHROMAX ) 500 mg in sodium chloride  0.9 % 250 mL IVPB (has no administration in time range)    ED Course/ Medical Decision Making/ A&P                                 Medical Decision Making   CHASTIN RIESGO is a 63 y.o. male with a past medical history significant for COPD, hypertension, alcohol  abuse, previous pancreatitis, and diabetes who presents with congestion, cough, shortness of breath, and respiratory distress.  According to  EMS, patient has had some cough for the last few days but today rapidly worsened with worsening shortness of breath.  Patient found to have oxygen  saturations in the 80s and was tachypneic.  EMS found patient to have significant wheezing so gave Solu-Medrol , some fluids for hypotension and route that improved, and DuoNebs.  Patient's breathing improved but is still on 5 L nasal cannula to keep oxygen  saturations in the 90s.  Patient is denying chest pain or palpitations and denies nausea, vomiting, constipation, diarrhea, or urinary changes.  Denies leg pain or leg swelling.  No known exposures to COVID/flu/RSV.  On exam, lungs had rhonchi, and wheezing.  Faint rales as well.  Abdomen was complete nontender.  Good pulses in extremities.  Legs are nontender nonedematous.  Do not see evidence of acute trauma.  Clinically I am concerned about pneumonia versus COPD exacerbation.  He is now on 5 L and is breathing better.  The medications are helping with his breathing and he is resting more comfortably.  Workup shows he does have a leukocytosis and a lactic acidosis and his x-ray shows pneumonia.  Was given antibiotics to treat and when his labs have completed more, will admit.  Medicine will admit for further management of pneumonia.  LFTs were elevated as well, he does have document history of alcohol  abuse so this may be the cause, will defer further workup of this to medicine team.  BNP in process due to some of the rales.  Medicine will admit.         Final Clinical Impression(s) / ED Diagnoses Final diagnoses:  Respiratory distress  Hypoxia  Pneumonia due to infectious organism, unspecified laterality, unspecified part of lung     Clinical Impression: 1. Respiratory distress   2. Hypoxia   3. Pneumonia due to infectious organism, unspecified laterality, unspecified part of lung     Disposition: Admit  This note was prepared with assistance of Dragon voice recognition software.  Occasional wrong-word or sound-a-like substitutions may have occurred due to the inherent limitations of voice recognition software.      Cheston Coury, Lonni PARAS, MD 11/20/23 [phone]

## 2023-11-19 NOTE — ED Triage Notes (Signed)
 Pt via EMS from home c/o respiratory distress, 85% sat on 2L O2. Hx COPD with worsening lethargy and tachypnea throughout the day. Wheezing, rhonchi, and rales throughout bilateral lungs. BP 62 systolic per EMS.   Gave 2 duoneb treatments and 125mg  solu-medrol  with 500cc NS bolus en route. 22ga IV in left hand. Pt is agitated.

## 2023-11-19 NOTE — ED Notes (Signed)
 O2 sat reads 97% on forehead at 5L nasal cannula. MD aware.

## 2023-11-20 DIAGNOSIS — R9431 Abnormal electrocardiogram [ECG] [EKG]: Secondary | ICD-10-CM | POA: Diagnosis not present

## 2023-11-20 DIAGNOSIS — J441 Chronic obstructive pulmonary disease with (acute) exacerbation: Secondary | ICD-10-CM

## 2023-11-20 DIAGNOSIS — F1020 Alcohol dependence, uncomplicated: Secondary | ICD-10-CM | POA: Diagnosis present

## 2023-11-20 DIAGNOSIS — E1165 Type 2 diabetes mellitus with hyperglycemia: Secondary | ICD-10-CM | POA: Diagnosis present

## 2023-11-20 DIAGNOSIS — J9621 Acute and chronic respiratory failure with hypoxia: Secondary | ICD-10-CM | POA: Diagnosis not present

## 2023-11-20 DIAGNOSIS — E870 Hyperosmolality and hypernatremia: Secondary | ICD-10-CM | POA: Diagnosis not present

## 2023-11-20 DIAGNOSIS — J168 Pneumonia due to other specified infectious organisms: Secondary | ICD-10-CM | POA: Diagnosis not present

## 2023-11-20 DIAGNOSIS — Z66 Do not resuscitate: Secondary | ICD-10-CM | POA: Diagnosis not present

## 2023-11-20 DIAGNOSIS — A419 Sepsis, unspecified organism: Secondary | ICD-10-CM | POA: Diagnosis not present

## 2023-11-20 DIAGNOSIS — Z515 Encounter for palliative care: Secondary | ICD-10-CM | POA: Diagnosis not present

## 2023-11-20 DIAGNOSIS — L0211 Cutaneous abscess of neck: Secondary | ICD-10-CM | POA: Diagnosis not present

## 2023-11-20 DIAGNOSIS — J690 Pneumonitis due to inhalation of food and vomit: Secondary | ICD-10-CM | POA: Diagnosis present

## 2023-11-20 DIAGNOSIS — I5031 Acute diastolic (congestive) heart failure: Secondary | ICD-10-CM | POA: Diagnosis not present

## 2023-11-20 DIAGNOSIS — F17210 Nicotine dependence, cigarettes, uncomplicated: Secondary | ICD-10-CM | POA: Diagnosis not present

## 2023-11-20 DIAGNOSIS — R791 Abnormal coagulation profile: Secondary | ICD-10-CM | POA: Insufficient documentation

## 2023-11-20 DIAGNOSIS — F17200 Nicotine dependence, unspecified, uncomplicated: Secondary | ICD-10-CM

## 2023-11-20 DIAGNOSIS — K122 Cellulitis and abscess of mouth: Secondary | ICD-10-CM | POA: Diagnosis not present

## 2023-11-20 DIAGNOSIS — I10 Essential (primary) hypertension: Secondary | ICD-10-CM | POA: Diagnosis not present

## 2023-11-20 DIAGNOSIS — J449 Chronic obstructive pulmonary disease, unspecified: Secondary | ICD-10-CM | POA: Diagnosis not present

## 2023-11-20 DIAGNOSIS — F1010 Alcohol abuse, uncomplicated: Secondary | ICD-10-CM | POA: Diagnosis not present

## 2023-11-20 DIAGNOSIS — D638 Anemia in other chronic diseases classified elsewhere: Secondary | ICD-10-CM | POA: Diagnosis present

## 2023-11-20 DIAGNOSIS — J189 Pneumonia, unspecified organism: Secondary | ICD-10-CM | POA: Diagnosis not present

## 2023-11-20 DIAGNOSIS — J9622 Acute and chronic respiratory failure with hypercapnia: Secondary | ICD-10-CM | POA: Diagnosis not present

## 2023-11-20 DIAGNOSIS — Z1152 Encounter for screening for COVID-19: Secondary | ICD-10-CM | POA: Diagnosis not present

## 2023-11-20 DIAGNOSIS — E872 Acidosis, unspecified: Secondary | ICD-10-CM | POA: Diagnosis present

## 2023-11-20 DIAGNOSIS — K047 Periapical abscess without sinus: Secondary | ICD-10-CM | POA: Diagnosis not present

## 2023-11-20 DIAGNOSIS — Z681 Body mass index (BMI) 19 or less, adult: Secondary | ICD-10-CM | POA: Diagnosis not present

## 2023-11-20 DIAGNOSIS — E43 Unspecified severe protein-calorie malnutrition: Secondary | ICD-10-CM | POA: Diagnosis not present

## 2023-11-20 DIAGNOSIS — R0603 Acute respiratory distress: Secondary | ICD-10-CM | POA: Diagnosis not present

## 2023-11-20 DIAGNOSIS — R7989 Other specified abnormal findings of blood chemistry: Secondary | ICD-10-CM | POA: Diagnosis present

## 2023-11-20 DIAGNOSIS — K861 Other chronic pancreatitis: Secondary | ICD-10-CM

## 2023-11-20 DIAGNOSIS — E871 Hypo-osmolality and hyponatremia: Secondary | ICD-10-CM | POA: Diagnosis present

## 2023-11-20 DIAGNOSIS — R6520 Severe sepsis without septic shock: Secondary | ICD-10-CM

## 2023-11-20 DIAGNOSIS — R7881 Bacteremia: Secondary | ICD-10-CM | POA: Diagnosis not present

## 2023-11-20 DIAGNOSIS — R64 Cachexia: Secondary | ICD-10-CM | POA: Diagnosis present

## 2023-11-20 DIAGNOSIS — R6521 Severe sepsis with septic shock: Secondary | ICD-10-CM | POA: Diagnosis not present

## 2023-11-20 DIAGNOSIS — E875 Hyperkalemia: Secondary | ICD-10-CM | POA: Diagnosis present

## 2023-11-20 DIAGNOSIS — A4181 Sepsis due to Enterococcus: Secondary | ICD-10-CM | POA: Diagnosis present

## 2023-11-20 DIAGNOSIS — G9341 Metabolic encephalopathy: Secondary | ICD-10-CM | POA: Diagnosis present

## 2023-11-20 DIAGNOSIS — R0902 Hypoxemia: Secondary | ICD-10-CM | POA: Diagnosis not present

## 2023-11-20 DIAGNOSIS — B952 Enterococcus as the cause of diseases classified elsewhere: Secondary | ICD-10-CM | POA: Diagnosis not present

## 2023-11-20 DIAGNOSIS — L0291 Cutaneous abscess, unspecified: Secondary | ICD-10-CM | POA: Diagnosis not present

## 2023-11-20 DIAGNOSIS — I110 Hypertensive heart disease with heart failure: Secondary | ICD-10-CM | POA: Diagnosis present

## 2023-11-20 LAB — LACTIC ACID, PLASMA
Lactic Acid, Venous: 3.2 mmol/L (ref 0.5–1.9)
Lactic Acid, Venous: 2.6 mmol/L (ref 0.5–1.9)

## 2023-11-20 LAB — RESP PANEL BY RT-PCR (RSV, FLU A&B, COVID)  RVPGX2
Influenza A by PCR: NEGATIVE
Influenza B by PCR: NEGATIVE
Resp Syncytial Virus by PCR: NEGATIVE
SARS Coronavirus 2 by RT PCR: NEGATIVE

## 2023-11-20 LAB — CBG MONITORING, ED
Glucose-Capillary: 117 mg/dL — ABNORMAL HIGH (ref 70–99)
Glucose-Capillary: 141 mg/dL — ABNORMAL HIGH (ref 70–99)
Glucose-Capillary: 138 mg/dL — ABNORMAL HIGH (ref 70–99)
Glucose-Capillary: 145 mg/dL — ABNORMAL HIGH (ref 70–99)
Glucose-Capillary: 114 mg/dL — ABNORMAL HIGH (ref 70–99)

## 2023-11-20 LAB — CBC
HCT: 37.9 % — ABNORMAL LOW (ref 39.0–52.0)
Hemoglobin: 12.3 g/dL — ABNORMAL LOW (ref 13.0–17.0)
MCH: 29.2 pg (ref 26.0–34.0)
MCHC: 32.5 g/dL (ref 30.0–36.0)
MCV: 90 fL (ref 80.0–100.0)
Platelets: 94 10*3/uL — ABNORMAL LOW (ref 150–400)
RBC: 4.21 MIL/uL — ABNORMAL LOW (ref 4.22–5.81)
RDW: 15 % (ref 11.5–15.5)
WBC: 23.7 10*3/uL — ABNORMAL HIGH (ref 4.0–10.5)
nRBC: 0.1 % (ref 0.0–0.2)

## 2023-11-20 LAB — COMPREHENSIVE METABOLIC PANEL
ALT: 137 U/L — ABNORMAL HIGH (ref 0–44)
ALT: 127 U/L — ABNORMAL HIGH (ref 0–44)
AST: 222 U/L — ABNORMAL HIGH (ref 15–41)
AST: 142 U/L — ABNORMAL HIGH (ref 15–41)
Albumin: 2.9 g/dL — ABNORMAL LOW (ref 3.5–5.0)
Albumin: 2.6 g/dL — ABNORMAL LOW (ref 3.5–5.0)
Alkaline Phosphatase: 140 U/L — ABNORMAL HIGH (ref 38–126)
Alkaline Phosphatase: 112 U/L (ref 38–126)
Anion gap: 19 — ABNORMAL HIGH (ref 5–15)
Anion gap: 13 (ref 5–15)
BUN: 53 mg/dL — ABNORMAL HIGH (ref 8–23)
BUN: 66 mg/dL — ABNORMAL HIGH (ref 8–23)
CO2: 32 mmol/L (ref 22–32)
CO2: 36 mmol/L — ABNORMAL HIGH (ref 22–32)
Calcium: 8.7 mg/dL — ABNORMAL LOW (ref 8.9–10.3)
Calcium: 8.2 mg/dL — ABNORMAL LOW (ref 8.9–10.3)
Chloride: 82 mmol/L — ABNORMAL LOW (ref 98–111)
Chloride: 86 mmol/L — ABNORMAL LOW (ref 98–111)
Creatinine, Ser: 1.14 mg/dL (ref 0.61–1.24)
Creatinine, Ser: 1.01 mg/dL (ref 0.61–1.24)
GFR, Estimated: >60 mL/min (ref >60)
GFR, Estimated: >60 mL/min (ref >60)
Glucose, Bld: 107 mg/dL — ABNORMAL HIGH (ref 70–99)
Glucose, Bld: 153 mg/dL — ABNORMAL HIGH (ref 70–99)
Potassium: 5.8 mmol/L — ABNORMAL HIGH (ref 3.5–5.1)
Potassium: 5.3 mmol/L — ABNORMAL HIGH (ref 3.5–5.1)
Sodium: 133 mmol/L — ABNORMAL LOW (ref 135–145)
Sodium: 135 mmol/L (ref 135–145)
Total Bilirubin: 1.8 mg/dL — ABNORMAL HIGH (ref 0.0–1.2)
Total Bilirubin: 1.3 mg/dL — ABNORMAL HIGH (ref 0.0–1.2)
Total Protein: 5.7 g/dL — ABNORMAL LOW (ref 6.5–8.1)
Total Protein: 5.4 g/dL — ABNORMAL LOW (ref 6.5–8.1)

## 2023-11-20 LAB — GLUCOSE, CAPILLARY
Glucose-Capillary: 117 mg/dL — ABNORMAL HIGH (ref 70–99)
Glucose-Capillary: 187 mg/dL — ABNORMAL HIGH (ref 70–99)

## 2023-11-20 LAB — PHOSPHORUS
Phosphorus: 6.7 mg/dL — ABNORMAL HIGH (ref 2.5–4.6)
Phosphorus: 5.3 mg/dL — ABNORMAL HIGH (ref 2.5–4.6)

## 2023-11-20 LAB — BLOOD GAS, VENOUS
Acid-Base Excess: 10.2 mmol/L — ABNORMAL HIGH (ref 0.0–2.0)
Bicarbonate: 40.4 mmol/L — ABNORMAL HIGH (ref 20.0–28.0)
O2 Saturation: 73.8 %
Patient temperature: 36.5
pCO2, Ven: 82 mm[Hg] (ref 44–60)
pH, Ven: 7.3 (ref 7.25–7.43)
pO2, Ven: 48 mm[Hg] — ABNORMAL HIGH (ref 32–45)

## 2023-11-20 LAB — PROTIME-INR
INR: 2.1 — ABNORMAL HIGH (ref 0.8–1.2)
Prothrombin Time: 23.7 s — ABNORMAL HIGH (ref 11.4–15.2)

## 2023-11-20 LAB — OSMOLALITY: Osmolality: 310 mosm/kg — ABNORMAL HIGH (ref 275–295)

## 2023-11-20 LAB — CK: Total CK: 128 U/L (ref 49–397)

## 2023-11-20 LAB — MRSA NEXT GEN BY PCR, NASAL: MRSA by PCR Next Gen: NOT DETECTED

## 2023-11-20 LAB — ETHANOL: Alcohol, Ethyl (B): <10 mg/dL (ref <10)

## 2023-11-20 LAB — AMMONIA: Ammonia: 59 umol/L — ABNORMAL HIGH (ref 9–35)

## 2023-11-20 LAB — HIV ANTIBODY (ROUTINE TESTING W REFLEX): HIV Screen 4th Generation wRfx: NONREACTIVE

## 2023-11-20 LAB — MAGNESIUM
Magnesium: 1.7 mg/dL (ref 1.7–2.4)
Magnesium: 1.5 mg/dL — ABNORMAL LOW (ref 1.7–2.4)

## 2023-11-20 LAB — PREALBUMIN: Prealbumin: <5 mg/dL — ABNORMAL LOW (ref 18–38)

## 2023-11-20 LAB — PROCALCITONIN: Procalcitonin: 2.56 ng/mL

## 2023-11-20 LAB — BRAIN NATRIURETIC PEPTIDE: B Natriuretic Peptide: 957.7 pg/mL — ABNORMAL HIGH (ref 0.0–100.0)

## 2023-11-20 LAB — TSH: TSH: 2.802 u[IU]/mL (ref 0.350–4.500)

## 2023-11-20 MED ORDER — THIAMINE MONONITRATE 100 MG PO TABS
100.0000 mg | ORAL_TABLET | Freq: Every day | ORAL | Status: DC
  Administered 2023-11-21: 100 mg via ORAL
  Filled 2023-11-20: qty 1

## 2023-11-20 MED ORDER — METHYLPREDNISOLONE SODIUM SUCC 40 MG IJ SOLR
40.0000 mg | Freq: Two times a day (BID) | INTRAMUSCULAR | Status: AC
  Administered 2023-11-20 (×2): 40 mg via INTRAVENOUS
  Filled 2023-11-20 (×2): qty 1

## 2023-11-20 MED ORDER — VITAMIN K1 10 MG/ML IJ SOLN
10.0000 mg | Freq: Once | INTRAMUSCULAR | Status: AC
  Administered 2023-11-20: 10 mg via SUBCUTANEOUS
  Filled 2023-11-20: qty 1

## 2023-11-20 MED ORDER — SODIUM CHLORIDE 0.9 % IV SOLN: 500.0000 mg | INTRAVENOUS | Status: DC

## 2023-11-20 MED ORDER — SODIUM CHLORIDE 0.9 % IV SOLN: INTRAVENOUS | Status: DC

## 2023-11-20 MED ORDER — ADULT MULTIVITAMIN W/MINERALS CH: 1.0000 | ORAL_TABLET | Freq: Every day | ORAL | Status: DC

## 2023-11-20 MED ORDER — ACETAMINOPHEN 650 MG RE SUPP: 650.0000 mg | Freq: Four times a day (QID) | RECTAL | Status: DC | PRN

## 2023-11-20 MED ORDER — SODIUM CHLORIDE 0.9 % IV SOLN: INTRAVENOUS | Status: AC

## 2023-11-20 MED ORDER — FENTANYL CITRATE PF 50 MCG/ML IJ SOSY: 12.5000 ug | PREFILLED_SYRINGE | INTRAMUSCULAR | Status: DC | PRN

## 2023-11-20 MED ORDER — SODIUM CHLORIDE 0.9 % IV SOLN
1.5000 g | Freq: Four times a day (QID) | INTRAVENOUS | Status: DC
Start: 2023-11-20 — End: 2023-11-22
  Administered 2023-11-20 – 2023-11-22 (×9): 1.5 g via INTRAVENOUS
  Filled 2023-11-20 (×14): qty 4

## 2023-11-20 MED ORDER — NICOTINE 14 MG/24HR TD PT24
14.0000 mg | MEDICATED_PATCH | Freq: Every day | TRANSDERMAL | Status: DC
  Administered 2023-11-20 – 2023-11-27 (×8): 14 mg via TRANSDERMAL
  Filled 2023-11-20 (×8): qty 1

## 2023-11-20 MED ORDER — INSULIN ASPART 100 UNIT/ML IJ SOLN
0.0000 [IU] | INTRAMUSCULAR | Status: DC
  Administered 2023-11-20 (×3): 1 [IU] via SUBCUTANEOUS
  Administered 2023-11-21: 3 [IU] via SUBCUTANEOUS
  Administered 2023-11-21: 5 [IU] via SUBCUTANEOUS
  Administered 2023-11-21: 2 [IU] via SUBCUTANEOUS

## 2023-11-20 MED ORDER — SODIUM ZIRCONIUM CYCLOSILICATE 10 G PO PACK
10.0000 g | PACK | Freq: Once | ORAL | Status: AC
  Administered 2023-11-20: 10 g via ORAL
  Filled 2023-11-20: qty 1

## 2023-11-20 MED ORDER — IPRATROPIUM-ALBUTEROL 0.5-2.5 (3) MG/3ML IN SOLN
3.0000 mL | Freq: Four times a day (QID) | RESPIRATORY_TRACT | Status: DC
Start: 2023-11-20 — End: 2023-11-21
  Administered 2023-11-20 – 2023-11-21 (×5): 3 mL via RESPIRATORY_TRACT
  Filled 2023-11-20: qty 6
  Filled 2023-11-20 (×3): qty 3

## 2023-11-20 MED ORDER — THIAMINE HCL 100 MG/ML IJ SOLN: 100.0000 mg | Freq: Every day | INTRAMUSCULAR | Status: DC

## 2023-11-20 MED ORDER — FOLIC ACID 1 MG PO TABS
1.0000 mg | ORAL_TABLET | Freq: Every day | ORAL | Status: DC
  Administered 2023-11-21: 1 mg via ORAL
  Filled 2023-11-20: qty 1

## 2023-11-20 MED ORDER — LORAZEPAM 1 MG PO TABS
1.0000 mg | ORAL_TABLET | ORAL | Status: AC | PRN
  Filled 2023-11-20: qty 2

## 2023-11-20 MED ORDER — LORAZEPAM 2 MG/ML IJ SOLN
1.0000 mg | INTRAMUSCULAR | Status: AC | PRN
  Administered 2023-11-22: 2 mg via INTRAVENOUS
  Filled 2023-11-20: qty 1

## 2023-11-20 MED ORDER — LACTATED RINGERS IV SOLN
150.0000 mL/h | INTRAVENOUS | Status: DC
  Administered 2023-11-20: 150 mL/h via INTRAVENOUS

## 2023-11-20 MED ORDER — ONDANSETRON HCL 4 MG PO TABS
4.0000 mg | ORAL_TABLET | Freq: Four times a day (QID) | ORAL | Status: DC | PRN
Start: 2023-11-20 — End: 2023-11-21

## 2023-11-20 MED ORDER — THIAMINE HCL 100 MG/ML IJ SOLN
100.0000 mg | Freq: Every day | INTRAMUSCULAR | Status: DC
  Administered 2023-11-20 – 2023-11-22 (×2): 100 mg via INTRAVENOUS
  Filled 2023-11-20 (×2): qty 2

## 2023-11-20 MED ORDER — ONDANSETRON HCL 4 MG/2ML IJ SOLN
4.0000 mg | Freq: Four times a day (QID) | INTRAMUSCULAR | Status: DC | PRN
  Administered 2023-11-22: 4 mg via INTRAVENOUS

## 2023-11-20 MED ORDER — PREDNISONE 20 MG PO TABS
40.0000 mg | ORAL_TABLET | Freq: Every day | ORAL | Status: DC
  Administered 2023-11-21: 40 mg via ORAL
  Filled 2023-11-20: qty 2

## 2023-11-20 MED ORDER — ACETAMINOPHEN 325 MG PO TABS
650.0000 mg | ORAL_TABLET | Freq: Four times a day (QID) | ORAL | Status: DC | PRN
  Administered 2023-11-21: 650 mg via ORAL
  Filled 2023-11-20: qty 2

## 2023-11-20 MED ORDER — CHLORHEXIDINE GLUCONATE CLOTH 2 % EX PADS
6.0000 | MEDICATED_PAD | Freq: Once | CUTANEOUS | Status: AC
  Administered 2023-11-20: 6 via TOPICAL

## 2023-11-20 NOTE — Assessment & Plan Note (Addendum)
 11-20-2023 was discharged home in 05-2023 on 2 L/min O2. Presented with O2 sats 80% on 5 L/min. Currently on 3 L/min. VBG shows PCO2 of 82.  Worsening of hypoxia and hypercapnia due to COPD exacerbation and pneumonia. Continue with supplemental O2 for now. Not in any respiratory distress. Can hold off on NIV/Bipap for now. Keep O2 sats 88-92%.  11-21-2023 continue with supplemental O2. Continue with oral/NT suctioning as needed.

## 2023-11-20 NOTE — Assessment & Plan Note (Addendum)
 11-20-2023 continue with IV solumedrol and transition to po prednisone. Continue with nebs. 11-21-2023 continue with po prednisone 40 mg qday and scheduled nebs.

## 2023-11-20 NOTE — Assessment & Plan Note (Addendum)
 11-20-2023 no reported systemic anticoagulants on his med list.  He was not discharged to home on anticoagulants in 05-2023. Will give 10 mg SQ vitamin K and repeat IN in AM. Could be nutritional deficiency. 11-21-2023 received 10 mg SQ vitamin K yesterday. INR down to 1.7. likely related to nutritional deficiency vs etoh abuse. Will give another 10 mg SQ vitamin K.

## 2023-11-20 NOTE — Assessment & Plan Note (Addendum)
 11-20-2023 has RLL with sepsis. On Tx for potential aspiration pneumonia. 11-21-2023 likely aspiration pneumonia. On IV unasyn . Will check CT chest to make sure he isn't developing empyema/lung abscess. Verified that he was discharged to home in July 2024 on home O2 @ 2 L/min

## 2023-11-20 NOTE — H&P (Signed)
 [name] FMW:[id number] DOB: 10/08/1961 DOA: 11/19/2023     PCP: Vicci Barnie NOVAK, MD   Outpatient Specialists:    Pulmonary Dr.ICARD    Patient arrived to ER on 11/19/23 at 2031 Referred by Attending Tegeler, Lonni PARAS, *   Patient coming from:    home Lives  With family    Chief Complaint:   Chief Complaint  Patient presents with   Respiratory Distress    HPI: [name] is a 63 y.o. male with medical history significant of COPD, DM2, chronic pancreatitis, HTN, HLD, alcohol  abuse, tobacco abuse    Presented with cough worsening shortness of breath Increased SOB, hx of EtOH and COpD he sleeps on 2L o2 but does not use during he day Pt continue to smoke Has history of chronic recurrent pneumonias for which she also follows with pulmonology History of chronic alcohol  abuse and has interfere his medical treatment in the past Today he comes in with respiratory distress satting 85% on 2 L at home Has been feeling more lethargic and tachypneic Noted to have some wheezing and coughing crackles Initially when EMS arrived systolic blood pressure down to 62 He received 2 DuoNeb treatments and a dose of Solu-Medrol  with some improvement    Reports he does not remember when was the last time he drank EtOH Thinks it has been a while  Still smokes Does not know if he takes his meds  And unsure if he has a fever  Denies significant ETOH intake   Does  smoke  but interested in quitting   Lab Results  Component Value Date   SARSCOV2NAA NEGATIVE 05/28/2023   SARSCOV2NAA NEGATIVE 07/11/2022        Regarding pertinent Chronic problems:          DM 2 -  Lab Results  Component Value Date   HGBA1C 8.0 (A) 09/18/2023   on PO meds only,                           COPD -  followed by pulmonology   not  on baseline oxygen      Chronic anemia - baseline hg Hemoglobin & Hematocrit  Recent Labs    05/30/23 0340 05/31/23 0120 11/19/23 2245  HGB 12.2*  12.2* 14.2   Iron/TIBC/Ferritin/ %Sat    Component Value Date/Time   IRON 33 (L) 02/07/2022 0939   TIBC 330 02/07/2022 0939   FERRITIN 70 02/07/2022 0939   IRONPCTSAT 10 (L) 02/07/2022 0939      While in ER:   Chest x-ray showing pneumonia started on Rocephin  azithromycin  Noted to have elevated potassium 5.5    Lab Orders         Blood culture (routine x 2)         Resp panel by RT-PCR (RSV, Flu A&B, Covid) Anterior Nasal Swab         CBC with Differential         Comprehensive metabolic panel         Lactic acid, plasma         Brain natriuretic peptide       CXR - Right basilar infiltrate.     Following Medications were ordered in ER: Medications  albuterol  (VENTOLIN  HFA) 108 (90 Base) MCG/ACT inhaler 2 puff (has no administration in time range)  cefTRIAXone  (ROCEPHIN ) 1 g in sodium chloride  0.9 % 100 mL IVPB (1 g Intravenous New Bag/Given 11/19/23 2359)  azithromycin  (ZITHROMAX ) 500 mg in sodium chloride  0.9 % 250 mL IVPB (500 mg Intravenous New Bag/Given 11/19/23 2354)       ED Triage Vitals  Encounter Vitals Group     BP 11/19/23 2051 99/76     Systolic BP Percentile --      Diastolic BP Percentile --      Pulse Rate 11/19/23 2051 79     Resp 11/19/23 2051 (!) 39     Temp 11/19/23 2051 (!) 96.1 F (35.6 C)     Temp Source 11/19/23 2051 Temporal     SpO2 11/19/23 2044 91 %     Weight 11/19/23 2044 130 lb (59 kg)     Height 11/19/23 2044 6' (1.829 m)     Head Circumference --      Peak Flow --      Pain Score 11/19/23 2044 0     Pain Loc --      Pain Education --      Exclude from Growth Chart --   UFJK(75)@     _________________________________________ Significant initial  Findings: Abnormal Labs Reviewed  CBC WITH DIFFERENTIAL/PLATELET - Abnormal; Notable for the following components:      Result Value   WBC 24.7 (*)    Platelets 102 (*)    Neutro Abs 22.9 (*)    Lymphs Abs 0.6 (*)    Abs Immature Granulocytes 0.25 (*)    All other components within  normal limits  COMPREHENSIVE METABOLIC PANEL - Abnormal; Notable for the following components:   Potassium 5.5 (*)    Chloride 84 (*)    CO2 36 (*)    Glucose, Bld 116 (*)    BUN 52 (*)    Calcium  8.8 (*)    Total Protein 6.2 (*)    Albumin  2.9 (*)    AST 213 (*)    ALT 140 (*)    Total Bilirubin 1.8 (*)    Anion gap 16 (*)    All other components within normal limits  LACTIC ACID, PLASMA - Abnormal; Notable for the following components:   Lactic Acid, Venous 3.9 (*)    All other components within normal limits    ECG: Ordered Personally reviewed and interpreted by me showing: HR : 79 Rhythm: Sinus rhythm Ventricular premature complex Aberrant conduction of SV complex(es) Short PR interval Prominent P waves, nondiagnostic Nonspecific intraventricular conduction delay Probable anteroseptal infarct, old Minimal ST depression, anterior leads QTC 464  BNP (last 3 results) Recent Labs    05/29/23 0731  BNP 207.3*     COVID-19 Labs  No results for input(s): DDIMER, FERRITIN, LDH, CRP in the last 72 hours.  Lab Results  Component Value Date   SARSCOV2NAA NEGATIVE 05/28/2023   SARSCOV2NAA NEGATIVE 07/11/2022     ____________________ This patient meets SIRS Criteria and may be septic.   The recent clinical data is shown below. Vitals:   11/19/23 2051 11/19/23 2059 11/19/23 2100 11/19/23 2300  BP: 99/76  101/75 134/82  Pulse: 79  75 71  Resp: (!) 39  (!) 35 (!) 30  Temp: (!) 96.1 F (35.6 C)     TempSrc: Temporal     SpO2: (!) 80% 97% 100% 100%  Weight:      Height:          WBC     Component Value Date/Time   WBC 24.7 (H) 11/19/2023 2245   LYMPHSABS 0.6 (L) 11/19/2023 2245   LYMPHSABS 2.2 07/22/2020 1030  MONOABS 0.9 11/19/2023 2245   EOSABS 0.0 11/19/2023 2245   EOSABS 0.2 07/22/2020 1030   BASOSABS 0.1 11/19/2023 2245   BASOSABS 0.1 07/22/2020 1030    Lactic Acid, Venous    Component Value Date/Time   LATICACIDVEN 3.9 (HH) 11/19/2023  2245    Procalcitonin   Ordered      UA ordered     Results for orders placed or performed during the hospital encounter of 11/19/23  Resp panel by RT-PCR (RSV, Flu A&B, Covid) Anterior Nasal Swab     Status: None   Collection Time: 11/19/23 11:10 PM   Specimen: Anterior Nasal Swab  Result Value Ref Range Status   SARS Coronavirus 2 by RT PCR NEGATIVE NEGATIVE Final   Influenza A by PCR NEGATIVE NEGATIVE Final   Influenza B by PCR NEGATIVE NEGATIVE Final         Resp Syncytial Virus by PCR NEGATIVE NEGATIVE Final          ABX started Antibiotics Given (last 72 hours)     Date/Time Action Medication Dose Rate   11/19/23 2354 New Bag/Given   azithromycin  (ZITHROMAX ) 500 mg in sodium chloride  0.9 % 250 mL IVPB 500 mg 250 mL/hr   11/19/23 2359 New Bag/Given   cefTRIAXone  (ROCEPHIN ) 1 g in sodium chloride  0.9 % 100 mL IVPB 1 g 200 mL/hr       VBG pending    __________________________________________________________ Recent Labs  Lab 11/19/23 2245  NA 136  K 5.5*  CO2 36*  GLUCOSE 116*  BUN 52*  CREATININE 1.14  CALCIUM  8.8*    Cr   Up from baseline see below Lab Results  Component Value Date   CREATININE 1.14 11/19/2023   CREATININE 0.53 (L) 10/29/2023   CREATININE 0.48 (L) 10/04/2023    Recent Labs  Lab 11/19/23 2245  AST 213*  ALT 140*  ALKPHOS 124  BILITOT 1.8*  PROT 6.2*  ALBUMIN  2.9*   Lab Results  Component Value Date   CALCIUM  8.8 (L) 11/19/2023   PHOS 2.6 05/29/2023    Plt: Lab Results  Component Value Date   PLT 102 (L) 11/19/2023       Recent Labs  Lab 11/19/23 2245  WBC 24.7*  NEUTROABS 22.9*  HGB 14.2  HCT 44.3  MCV 92.5  PLT 102*    HG/HCT   stable,      Component Value Date/Time   HGB 14.2 11/19/2023 2245   HGB 12.3 (L) 02/07/2022 0939   HCT 44.3 11/19/2023 2245   HCT 35.2 (L) 02/07/2022 0939   MCV 92.5 11/19/2023 2245   MCV 100 (H) 02/07/2022 0939     _______________________________________________ Hospitalist  was called for admission for  CAP   The following Work up has been ordered so far:  Orders Placed This Encounter  Procedures   Blood culture (routine x 2)   Resp panel by RT-PCR (RSV, Flu A&B, Covid) Anterior Nasal Swab   DG Chest 2 View   CBC with Differential   Comprehensive metabolic panel   Lactic acid, plasma   Brain natriuretic peptide   ED Cardiac monitoring   Utilize spacer/aerochamber with mdi inhaler for COVID-19 positive patients or PUI for COVID-19   If O2 Sat <94% administer O2 at 2 liters/minute via nasal cannula   Consult for [facility] Admission   ED EKG   EKG 12-Lead     OTHER Significant initial  Findings:  labs showing:     DM  labs:  HbA1C:  Recent Labs    05/28/23 1933 09/18/23 1108  HGBA1C 7.4* 8.0*    CBG (last 3)  No results for input(s): GLUCAP in the last 72 hours.        Cultures:    Component Value Date/Time   SDES  07/11/2022 2141    IN/OUT CATH URINE Performed at [facility], [address], [place], [place] [address]    SPECREQUEST  07/11/2022 2141    NONE Performed at [facility], [address] [place], [place] [address]    CULT  07/11/2022 2141    NO GROWTH Performed at [facility], [address] [place], [place] [address]    REPTSTATUS 07/12/2022 FINAL 07/11/2022 2141     Radiological Exams on Admission: DG Chest 2 View Result Date: 11/19/2023 CLINICAL DATA:  Shortness of breath and hypoxia, initial encounter EXAM: CHEST - 2 VIEW COMPARISON:  05/28/2023 FINDINGS: Cardiac shadow is within normal limits. Lungs are well aerated bilaterally. Patchy airspace opacity is noted in the right base laterally new from the prior exam consistent with acute infiltrate. No acute bony abnormality is noted. IMPRESSION: Right basilar infiltrate. Electronically Signed   By: Oneil Devonshire M.D.   On: 11/19/2023 21:29    _______________________________________________________________________________________________________ Latest  Blood pressure 134/82, pulse 71, temperature (!) 96.1 F (35.6 C), temperature source Temporal, resp. rate (!) 30, height 6' (1.829 m), weight 59 kg, SpO2 100%.   Vitals  labs and radiology finding personally reviewed  Review of Systems:  non-productive cough  Pertinent positives include:   fatigue,excess mucus  shortness of breath at rest.   dyspnea on exertion,  Constitutional:  No weight loss, night sweats, Fevers, chills, weight loss  HEENT:  No headaches, Difficulty swallowing,Tooth/dental problems,Sore throat,  No sneezing, itching, ear ache, nasal congestion, post nasal drip,  Cardio-vascular:  No chest pain, Orthopnea, PND, anasarca, dizziness, palpitations.no Bilateral lower extremity swelling  GI:  No heartburn, indigestion, abdominal pain, nausea, vomiting, diarrhea, change in bowel habits, loss of appetite, melena, blood in stool, hematemesis Resp:   No , no productive cough, No , No coughing up of blood.No change in color of mucus.No wheezing. Skin:  no rash or lesions. No jaundice GU:  no dysuria, change in color of urine, no urgency or frequency. No straining to urinate.  No flank pain.  Musculoskeletal:  No joint pain or no joint swelling. No decreased range of motion. No back pain.  Psych:  No change in mood or affect. No depression or anxiety. No memory loss.  Neuro: no localizing neurological complaints, no tingling, no weakness, no double vision, no gait abnormality, no slurred speech, no confusion  All systems reviewed and apart from HOPI all are negative _______________________________________________________________________________________________ Past Medical History:   Past Medical History:  Diagnosis Date   COPD (chronic obstructive pulmonary disease) (HCC)    Diabetes mellitus without complication (HCC)    Pancreatitis      Past  Surgical History:  Procedure Laterality Date   NO PAST SURGERIES      Social History:  Ambulatory   independently    reports that he has been smoking cigarettes. He has a 35 pack-year smoking history. He has never used smokeless tobacco. He reports current alcohol  use. He reports that he does not use drugs.   Family History:   Family History  Problem Relation Age of Onset   Diabetes Sister    ______________________________________________________________________________________________ Allergies: No Known Allergies   Prior to Admission medications  Medication Sig Start Date End Date Taking? Authorizing Provider  budesonide  (PULMICORT ) 0.5 MG/2ML nebulizer solution Take 2 mLs (0.5 mg total) by nebulization 2 (two) times daily. 06/01/23 07/12/23  Rosario Leatrice FERNS, MD  budesonide -formoterol  (BREYNA ) 160-4.5 MCG/ACT inhaler Inhale 2 puffs into the lungs in the morning and at bedtime. 09/18/23   Vicci Barnie NOVAK, MD  glimepiride  (AMARYL ) 1 MG tablet Take 1 tablet (1 mg total) by mouth daily with breakfast. 09/18/23   Vicci Barnie NOVAK, MD  ipratropium-albuterol  (DUONEB) 0.5-2.5 (3) MG/3ML SOLN Take 3 mLs by nebulization every 4 (four) hours as needed. 09/18/23   Vicci Barnie NOVAK, MD  metFORMIN  (GLUCOPHAGE ) 1000 MG tablet Take 1 tablet (1,000 mg total) by mouth 2 (two) times daily with a meal. 09/18/23   Vicci Barnie NOVAK, MD  tiotropium (SPIRIVA  HANDIHALER) 18 MCG inhalation capsule Place 1 capsule (18 mcg total) into inhaler and inhale daily. 09/18/23   Vicci Barnie NOVAK, MD    ___________________________________________________________________________________________________ Physical Exam:    11/19/2023   11:00 PM 11/19/2023    9:00 PM 11/19/2023    8:51 PM  Vitals with BMI  Systolic 134 101 99  Diastolic 82 75 76  Pulse 71 75 79     1. General:  in No  Acute distress   Chronically ill  -appearing 2. Psychological: Alert and   Oriented 3. Head/ENT:    Dry Mucous  Membranes                          Head Non traumatic, neck supple                         Poor Dentition 4. SKIN: normal  Skin turgor,  Skin clean Dry and intact no rash    5. Heart: Regular rate and rhythm no  Murmur, no Rub or gallop 6. Lungs:some wheezes and crackles   7. Abdomen: Soft,  non-tender, Non distended bowel sounds present 8. Lower extremities: no clubbing, cyanosis, no  edema 9. Neurologically Grossly intact, moving all 4 extremities equally  tremolous 10. MSK: Normal range of motion    Chart has been reviewed  ______________________________________________________________________________________________  Assessment/Plan 63 y.o. male with medical history significant of COPD, DM2, chronic pancreatitis, HTN, HLD, alcohol  abuse, tobacco abuse   Admitted for   CAP versus aspiration pneumonia, acute on chronic respiratory failure, exacerbation Sepsis   Present on Admission:  CAP (community acquired pneumonia)  Acute on chronic respiratory failure with hypoxia and hypercapnia (HCC)  Alcohol  abuse  Chronic pancreatitis, unspecified pancreatitis type (HCC)  TOBACCO ABUSE  Type 2 diabetes mellitus with hyperglycemia (HCC)  COPD with acute exacerbation (HCC)  Sepsis (HCC)  Hyperkalemia  Elevated LFTs     Acute on chronic respiratory failure with hypoxia and hypercapnia (HCC)  this patient has acute respiratory failure with Hypoxia as documented by the presence of following: O2 saturatio< 90% on RA   Likely due to:   Pneumonia,  COPD exacerbation,  Provide O2 therapy and titrate as needed  Continuous pulse ox   check Pulse ox with ambulation prior to discharge   may need  TC consult for home O2 set up    flutter valve ordered   Alcohol  abuse CIWA protocol ordered Monitor for any sign of EtOh withdrawal  CAP (community acquired pneumonia)   - -Patient presenting with  productive cough, fever    hypoxia  , and infiltrate in   lower  lobe on chest  x-ray -Infiltrate on CXR and 2-3 characteristics (fever, leukocytosis, purulent sputum) are consistent with pneumonia. -This appears to be most likely community-acquired pneumonia.   -  Suspect aspiration given hx of ETOH      will admit for treatment of CAP will start on appropriate antibiotic coverage. -Unasyn  azithromycin    Obtain:  sputum cultures,                                     Influenza pending                  COVID PCR negative                   blood cultures and sputum cultures ordered                   strep pneumo UA antigen,                   *  check for Legionella antigen.                Provide oxygen  as needed.     Chronic pancreatitis, unspecified pancreatitis type (HCC) Supportive measures currently not contributing  TOBACCO ABUSE  - Spoke about importance of quitting spent 5 minutes discussing options for treatment, prior attempts at quitting, and dangers of smoking  -At this point patient is   NOT  interested in quitting  - order nicotine  patch   - nursing tobacco cessation protocol   Type 2 diabetes mellitus with hyperglycemia (HCC)  - Order Sensitive SSI    -  check TSH and HgA1C  - Hold by mouth medication    COPD with acute exacerbation (HCC)  -  - Will initiate: Steroid taper  -  Antibiotics azithromycin  - Albuterol   PRN, - scheduled duoneb,  -  Breo or Dulera at discharge   -  Mucinex .  Titrate O2 to saturation >90%. Follow patients respiratory status.   VBG pending    Currently mentating well no evidence of symptomatic hypercarbia   Sepsis (HCC)  -SIRS criteria met with  elevated white blood cell count,       Component Value Date/Time   WBC 24.7 (H) 11/19/2023 2245   LYMPHSABS 0.6 (L) 11/19/2023 2245   LYMPHSABS 2.2 07/22/2020 1030      RR >20 Today's Vitals   11/19/23 2051 11/19/23 2059 11/19/23 2100 11/19/23 2300  BP: 99/76  101/75 134/82  Pulse: 79  75 71  Resp: (!) 39  (!) 35 (!) 30  Temp: (!) 96.1 F (35.6 C)      TempSrc: Temporal     SpO2: (!) 80% 97% 100% 100%  Weight:      Height:      PainSc:         The recent clinical data is shown below. Vitals:   11/19/23 2051 11/19/23 2059 11/19/23 2100 11/19/23 2300  BP: 99/76  101/75 134/82  Pulse: 79  75 71  Resp: (!) 39  (!) 35 (!) 30  Temp: (!) 96.1 F (35.6 C)     TempSrc: Temporal     SpO2: (!) 80% 97% 100% 100%  Weight:      Height:         -Most likely source being:  pulmonary,      Patient meeting criteria for Severe sepsis with    evidence of  end organ damage/organ dysfunction such as    elevated lactic acid >2     Component Value Date/Time   LATICACIDVEN 3.9 (HH) 11/19/2023 2245     SBP<90 mmhg or MAP < 65 mmhg,   Acute hypoxia requiring new supplemental oxygen , SpO2: 100 % O2 Flow Rate (L/min): 5 L/min    - Obtain serial lactic acid and procalcitonin level.  - Initiated IV antibiotics in ER: Antibiotics Given (last 72 hours)     Date/Time Action Medication Dose Rate   11/19/23 2354 New Bag/Given   azithromycin  (ZITHROMAX ) 500 mg in sodium chloride  0.9 % 250 mL IVPB 500 mg 250 mL/hr   11/19/23 2359 New Bag/Given   cefTRIAXone  (ROCEPHIN ) 1 g in sodium chloride  0.9 % 100 mL IVPB 1 g 200 mL/hr       Will continue  on : Unasyn  azithromycin    - await results of blood and urine culture  - Rehydrate aggressively  Intravenous fluids were administered,      12:50 AM   Hyperkalemia Repeat after rehydration Monitor on telemetry  Elevated LFTs In the setting of history of alcohol  abuse. Check INR    Other plan as per orders.  DVT prophylaxis:  SCD      Code Status:    Code Status: Prior FULL CODE e as per patient   I had personally discussed CODE STATUS with patient  ACP   none    Family Communication:   Family not at  Bedside    Diet  diabetic diet   Disposition Plan:      To home once workup is complete and patient is stable   Following barriers for discharge:     Electrolytes corrected                              , white count improving able to transition to PO antibiotics                             Will need to be able to tolerate PO                                                  Would benefit from PT/OT eval prior to DC  Ordered                                       Transition of care consulted                    Consults called: none     Admission status:  ED Disposition     ED Disposition  Admit   Condition  --   Comment  The patient appears reasonably stabilized for admission considering the current resources, flow, and capabilities available in the ED at this time, and I doubt any other [facility] requiring further screening and/or treatment in the ED prior to admission is  present.           inpatient     I Expect 2 midnight stay secondary to severity of patient's current illness need for inpatient interventions justified by the following:  hemodynamic instability despite optimal treatment (tachycardia  hypotension tachypnea hypoxia, )   Severe lab/radiological/exam abnormalities including:    CAP/aspiration pneumonia and extensive comorbidities including:  substance abuse  DM2    COPD/asthma   That are currently affecting medical management.   I expect  patient to be hospitalized for 2 midnights requiring inpatient medical care.  Patient is at high risk for adverse outcome (such as loss of life or disability) if not treated.  Indication for inpatient stay as follows:    Hemodynamic instability despite maximal medical therapy,    New or worsening hypoxia   Need for IV antibiotics, IV fluids,      Level of care     progressive     Lab Results  Component Value Date   SARSCOV2NAA NEGATIVE 05/28/2023     Precautions: admitted as   Covid Negative        Jaymz Traywick 11/20/2023, 1:14 AM    [facility]     after 2 AM please page floor coverage PA If 7AM-7PM, please contact the day team taking care of the patient using [email or web address]

## 2023-11-20 NOTE — Progress Notes (Signed)
 CSW added substance abuse resources to patient's AVS.  Edwin Dada, MSW, LCSW Transitions of Care  Clinical Social Worker II [phone]

## 2023-11-20 NOTE — Subjective & Objective (Addendum)
 Pt seen and examined.  RN just finished orally suctioning lots of thick, green sputum he coughed up. RN thinks he may have aspirated again.  Having loose stools since IV ABX started. Remains on 4 L/min supplemental O2. Moderate labored breathing.

## 2023-11-20 NOTE — Assessment & Plan Note (Addendum)
 11-20-2023 initial K 5.5, repeat 5.8.  11 AM K of 5.3. continue to monitor. 11-21-2023 resolved. K 4.5 today

## 2023-11-20 NOTE — Subjective & Objective (Signed)
 Increased SOB, hx of EtOH and COpD he sleeps on 2L o2 but does not use during he day Pt continue to smoke Has history of chronic recurrent pneumonias for which she also follows with pulmonology History of chronic alcohol  abuse and has interfere his medical treatment in the past Today he comes in with respiratory distress satting 85% on 2 L at home Has been feeling more lethargic and tachypneic Noted to have some wheezing and coughing crackles Initially when EMS arrived systolic blood pressure down to 62 He received 2 DuoNeb treatments and a dose of Solu-Medrol  with some improvement

## 2023-11-20 NOTE — ED Notes (Signed)
 This tech went into room and pt has removed his oxygen and sats were dropping to 82%. This tech asked why he removed the oxygen and he stated "I didn't want the thing in my nose". Cannula placed back in nose and RN notified.

## 2023-11-20 NOTE — Assessment & Plan Note (Signed)
- -  Patient presenting with  productive cough, fever    hypoxia  , and infiltrate in   lower lobe on chest x-ray -Infiltrate on CXR and 2-3 characteristics (fever, leukocytosis, purulent sputum) are consistent with pneumonia. -This appears to be most likely community-acquired pneumonia.   -  Suspect aspiration given hx of ETOH      will admit for treatment of CAP will start on appropriate antibiotic coverage. -Unasyn  azithromycin    Obtain:  sputum cultures,                                     Influenza pending                  COVID PCR negative                   blood cultures and sputum cultures ordered                   strep pneumo UA antigen,                   *  check for Legionella antigen.                Provide oxygen  as needed.

## 2023-11-20 NOTE — Assessment & Plan Note (Addendum)
 11-20-2023 AST 142, ALT 127.  Slightly improved since yesterday. Doubt infectious hepatitis. Could be related to ETOH. Continue to monitor. 11-21-2023 add-on hepatic function panel. Repeat CMP in AM.

## 2023-11-20 NOTE — Assessment & Plan Note (Signed)
 -  Spoke about importance of quitting spent 5 minutes discussing options for treatment, prior attempts at quitting, and dangers of smoking ? -At this point patient is    NOT  interested in quitting ? - order nicotine patch  ? - nursing tobacco cessation protocol ? ?

## 2023-11-20 NOTE — Assessment & Plan Note (Signed)
this patient has acute respiratory failure with Hypoxia   as documented by the presence of following: O2 saturatio< 90% on RA   Likely due to:   Pneumonia,  COPD exacerbation,   Provide O2 therapy and titrate as needed  Continuous pulse ox    check Pulse ox with ambulation prior to discharge   may need  TC consult for home O2 set up    flutter valve ordered

## 2023-11-20 NOTE — Assessment & Plan Note (Signed)
 11-20-2023 chronic. Pt has been advised to quit.

## 2023-11-20 NOTE — Progress Notes (Signed)
 PROGRESS NOTE    [name]  FMW:[id number] DOB: [birth year] DOA: 11/19/2023 PCP: Vicci Barnie NOVAK, MD  Subjective: Pt seen and examined. Remains on supplemental O2. Pt refused to participate with PT.    Hospital Course: HPI: [name] is a 63 y.o. male with medical history significant of COPD, DM2, chronic pancreatitis, HTN, HLD, alcohol  abuse, tobacco abuse     Presented with cough worsening shortness of breath Increased SOB, hx of EtOH and COpD he sleeps on 2L o2 but does not use during he day Pt continue to smoke Has history of chronic recurrent pneumonias for which she also follows with pulmonology History of chronic alcohol  abuse and has interfere his medical treatment in the past Today he comes in with respiratory distress satting 85% on 2 L at home Has been feeling more lethargic and tachypneic Noted to have some wheezing and coughing crackles Initially when EMS arrived systolic blood pressure down to 62 He received 2 DuoNeb treatments and a dose of Solu-Medrol  with some improvement    Reports he does not remember when was the last time he drank EtOH Thinks it has been a while  Still smokes Does not know if he takes his meds  And unsure if he has a fever   Denies significant ETOH intake   Does  smoke  but interested in quitting   Significant Events: Admitted 11/19/2023 for acute on chronic respiratory failure with hypoxia and hypercapnia   Significant Labs: WBC 24.7K, HgB 14.2, lactic acid 3.9, K 5.5, bicarb 36, BUN 51, Scr 1.14, AST 213, ALT 140 BNP 957, INR 2.1 VBG 7.3, PCO2 82, PO2 48  Significant Imaging Studies: CXR right basilar infiltrate  Antibiotic Therapy: Anti-infectives (From admission, onward)    Start     Dose/Rate Route Frequency Ordered Stop   11/21/23 0000  azithromycin  (ZITHROMAX ) 500 mg in sodium chloride  0.9 % 250 mL IVPB  Status:  Discontinued        500 mg 250 mL/hr over 60 Minutes Intravenous Every 24 hours 11/20/23  0023 11/20/23 0110   11/20/23 0045  ampicillin -sulbactam (UNASYN ) 1.5 g in sodium chloride  0.9 % 100 mL IVPB        1.5 g 200 mL/hr over 30 Minutes Intravenous Every 6 hours 11/20/23 0030     11/19/23 2200  cefTRIAXone  (ROCEPHIN ) 1 g in sodium chloride  0.9 % 100 mL IVPB        1 g 200 mL/hr over 30 Minutes Intravenous  Once 11/19/23 2154 11/20/23 0108   11/19/23 2200  azithromycin  (ZITHROMAX ) 500 mg in sodium chloride  0.9 % 250 mL IVPB        500 mg 250 mL/hr over 60 Minutes Intravenous  Once 11/19/23 2154 11/20/23 0108       Procedures:   Consultants:     Assessment and Plan: * CAP (community acquired pneumonia) - RLL 11-20-2023 has RLL with sepsis. On Tx for potential aspiration pneumonia.  Sepsis with acute organ dysfunction without septic shock (HCC) 11-20-2023 present on admission. WBC 24.7K, lactic acid 3.9, RLL pneumonia on CXR, O2 sats 80% on 5 L/min. Baseline on 2L/min O2.  Acute on chronic respiratory failure with hypoxia and hypercapnia (HCC) 11-20-2023 was discharged home in 05-2023 on 2 L/min O2. Presented with O2 sats 80% on 5 L/min. Currently on 3 L/min. VBG shows PCO2 of 82.  Worsening of hypoxia and hypercapnia due to COPD exacerbation and pneumonia. Continue with supplemental O2 for now. Not in any respiratory distress. Can  hold off on NIV/Bipap for now. Keep O2 sats 88-92%.  Hyperkalemia 11-20-2023 initial K 5.5, repeat 5.8.  11 AM K of 5.3. continue to monitor.  COPD with acute exacerbation (HCC) 11-20-2023 continue with IV solumedrol and transition to po prednisone . Continue with nebs.  Elevated LFTs 11-20-2023 AST 142, ALT 127.  Slightly improved since yesterday. Doubt infectious hepatitis. Could be related to ETOH. Continue to monitor.  Type 2 diabetes mellitus with hyperglycemia (HCC) 11-20-2023 continue with SSI.  Alcohol  abuse 11-20-2023 monitor for withdrawal. On CIWA . Monitor his breathing as benzos could make his respiratory failure  worse.  Chronic pancreatitis, unspecified pancreatitis type (HCC) 11-20-2023 chronic.  TOBACCO ABUSE 11-20-2023 chronic. Pt has been advised to quit.  Elevated INR 11-20-2023 no reported systemic anticoagulants on his med list.  He was not discharged to home on anticoagulants in 05-2023. Will give 10 mg SQ vitamin K and repeat IN in AM. Could be nutritional deficiency.    DVT prophylaxis: SCDs Start: 11/20/23 0700     Code Status: Full Code Family Communication: no family at bedside Disposition Plan: unknown Reason for continuing need for hospitalization: remains on IV ABX, increased supplemental O2.  Objective: Vitals:   11/20/23 0659 11/20/23 0814 11/20/23 0928 11/20/23 1115  BP:  128/88 106/88 113/65  Pulse:  91 82 85  Resp:  (!) 32  11  Temp: (!) 97.5 F (36.4 C) 97.8 F (36.6 C)    TempSrc: Oral Oral    SpO2:  96%  94%  Weight:      Height:        Intake/Output Summary (Last 24 hours) at 11/20/2023 1158 Last data filed at 11/20/2023 0252 Gross per 24 hour  Intake 451.37 ml  Output --  Net 451.37 ml   Filed Weights   11/19/23 2044  Weight: 59 kg    Examination:  Physical Exam Vitals and nursing note reviewed.  Constitutional:      Appearance: He is ill-appearing. He is not toxic-appearing or diaphoretic.     Comments: Appears chronically ill  HENT:     Head: Normocephalic and atraumatic.     Nose: Nose normal.  Eyes:     General: No scleral icterus. Cardiovascular:     Rate and Rhythm: Normal rate and regular rhythm.  Pulmonary:     Breath sounds: Decreased breath sounds and rhonchi present. No wheezing.  Abdominal:     General: Abdomen is flat. Bowel sounds are normal. There is no distension.  Musculoskeletal:     Right lower leg: No edema.     Left lower leg: No edema.  Skin:    General: Skin is warm and dry.     Capillary Refill: Capillary refill takes less than 2 seconds.  Neurological:     Mental Status: He is disoriented.     Data  Reviewed: I have personally reviewed following labs and imaging studies  CBC: Recent Labs  Lab 11/19/23 2245 11/20/23 1056  WBC 24.7* 23.7*  NEUTROABS 22.9*  --   HGB 14.2 12.3*  HCT 44.3 37.9*  MCV 92.5 90.0  PLT 102* 94*   Basic Metabolic Panel: Recent Labs  Lab 11/19/23 2245 11/20/23 0137 11/20/23 1056  NA 136 133* 135  K 5.5* 5.8* 5.3*  CL 84* 82* 86*  CO2 36* 32 36*  GLUCOSE 116* 107* 153*  BUN 52* 53* 66*  CREATININE 1.14 1.14 1.01  CALCIUM  8.8* 8.7* 8.2*  MG 1.7  --  1.5*  PHOS 6.7*  --  5.3*   GFR: Estimated Creatinine Clearance: 61.7 mL/min (by C-G formula based on SCr of 1.01 mg/dL). Liver Function Tests: Recent Labs  Lab 11/19/23 2245 11/20/23 0137 11/20/23 1056  AST 213* 222* 142*  ALT 140* 137* 127*  ALKPHOS 124 140* 112  BILITOT 1.8* 1.8* 1.3*  PROT 6.2* 5.7* 5.4*  ALBUMIN  2.9* 2.9* 2.6*    Recent Labs  Lab 11/20/23 0137  AMMONIA 59*   Coagulation Profile: Recent Labs  Lab 11/19/23 2245  INR 2.1*   Cardiac Enzymes: Recent Labs  Lab 11/19/23 2245  CKTOTAL 128   BNP (last 3 results) Recent Labs    05/29/23 0731 11/19/23 2245  BNP 207.3* 957.7*   CBG: Recent Labs  Lab 11/20/23 0158 11/20/23 0500 11/20/23 0821  GLUCAP 117* 141* 138*   Thyroid  Function Tests: Recent Labs    11/19/23 2245  TSH 2.802   Sepsis Labs: Recent Labs  Lab 11/19/23 2245 11/20/23 0137 11/20/23 0409  PROCALCITON 2.56  --   --   LATICACIDVEN 3.9* 3.2* 2.6*    Recent Results (from the past 240 hours)  Blood culture (routine x 2)     Status: None (Preliminary result)   Collection Time: 11/19/23 10:50 PM   Specimen: BLOOD RIGHT FOREARM  Result Value Ref Range Status   Specimen Description BLOOD RIGHT FOREARM  Final   Special Requests   Final    BOTTLES DRAWN AEROBIC AND ANAEROBIC Blood Culture results may not be optimal due to an inadequate volume of blood received in culture bottles   Culture   Final    NO GROWTH < 12 HOURS Performed  at [facility], [address] [place], [place] [address]    Report Status PENDING  Incomplete  Blood culture (routine x 2)     Status: None (Preliminary result)   Collection Time: 11/19/23 11:00 PM   Specimen: BLOOD  Result Value Ref Range Status   Specimen Description BLOOD RIGHT ANTECUBITAL  Final   Special Requests   Final    BOTTLES DRAWN AEROBIC AND ANAEROBIC Blood Culture results may not be optimal due to an inadequate volume of blood received in culture bottles   Culture   Final    NO GROWTH < 12 HOURS Performed at [facility], [address] [place], [place] [address]    Report Status PENDING  Incomplete  Resp panel by RT-PCR (RSV, Flu A&B, Covid) Anterior Nasal Swab     Status: None   Collection Time: 11/19/23 11:10 PM   Specimen: Anterior Nasal Swab  Result Value Ref Range Status   SARS Coronavirus 2 by RT PCR NEGATIVE NEGATIVE Final   Influenza A by PCR NEGATIVE NEGATIVE Final   Influenza B by PCR NEGATIVE NEGATIVE Final    Comment: (NOTE) The Xpert Xpress SARS-CoV-2/FLU/RSV plus assay is intended as an aid in the diagnosis of influenza from Nasopharyngeal swab specimens and should not be used as a sole basis for treatment. Nasal washings and aspirates are unacceptable for Xpert Xpress SARS-CoV-2/FLU/RSV testing.  Fact Sheet for Patients: bloggercourse.com  Fact Sheet for Healthcare Providers: seriousbroker.it  This test is not yet approved or cleared by the [place]  FDA and has been authorized for detection and/or diagnosis of SARS-CoV-2 by FDA under an Emergency Use Authorization (EUA). This EUA will remain in effect (meaning this test can be used) for the duration of the COVID-19 declaration under Section 564(b)(1) of the Act, 21 U.S.C. section 360bbb-3(b)(1), unless the authorization is terminated  or revoked.     Resp Syncytial Virus by PCR NEGATIVE NEGATIVE Final    Comment:  (NOTE) Fact Sheet for Patients: bloggercourse.com  Fact Sheet for Healthcare Providers: seriousbroker.it  This test is not yet approved or cleared by the [place]  FDA and has been authorized for detection and/or diagnosis of SARS-CoV-2 by FDA under an Emergency Use Authorization (EUA). This EUA will remain in effect (meaning this test can be used) for the duration of the COVID-19 declaration under Section 564(b)(1) of the Act, 21 U.S.C. section 360bbb-3(b)(1), unless the authorization is terminated or revoked.  Performed at [facility], [address], [place], [place] [address]      Radiology Studies: DG Chest 2 View Result Date: 11/19/2023 CLINICAL DATA:  Shortness of breath and hypoxia, initial encounter EXAM: CHEST - 2 VIEW COMPARISON:  05/28/2023 FINDINGS: Cardiac shadow is within normal limits. Lungs are well aerated bilaterally. Patchy airspace opacity is noted in the right base laterally new from the prior exam consistent with acute infiltrate. No acute bony abnormality is noted. IMPRESSION: Right basilar infiltrate. Electronically Signed   By: Oneil Devonshire M.D.   On: 11/19/2023 21:29    Scheduled Meds:  folic acid   1 mg Oral Daily   insulin  aspart  0-9 Units Subcutaneous Q4H   ipratropium-albuterol   3 mL Nebulization Q6H   methylPREDNISolone  (SOLU-MEDROL ) injection  40 mg Intravenous Q12H   Followed by   NOREEN ON 11/21/2023] predniSONE   40 mg Oral Q breakfast   nicotine   14 mg Transdermal Daily   phytonadione   10 mg Subcutaneous Once   thiamine   100 mg Oral Daily   Or   thiamine   100 mg Intravenous Daily   Continuous Infusions:  sodium chloride  75 mL/hr at 11/20/23 9187   ampicillin -sulbactam (UNASYN ) IV Stopped (11/20/23 0801)     LOS: 0 days   Time spent: 45 minutes  Camellia Door, DO  [facility]  11/20/2023, 11:58 AM

## 2023-11-20 NOTE — Progress Notes (Signed)
 Pharmacy Antibiotic Note  [name] is a 63 y.o. male admitted on 11/19/2023 with concern for aspiration pneumonia. Pharmacy has been consulted for unasyn  dosing.  Plan: Unasyn  1.5g q6h  F/u renal function, infectious work up and length of therapy  Height: 6' (182.9 cm) Weight: 59 kg (130 lb) IBW/kg (Calculated) : 77.6  Temp (24hrs), Avg:96.1 F (35.6 C), Min:96.1 F (35.6 C), Max:96.1 F (35.6 C)  Recent Labs  Lab 11/19/23 2245  WBC 24.7*  CREATININE 1.14  LATICACIDVEN 3.9*    Estimated Creatinine Clearance: 54.6 mL/min (by C-G formula based on SCr of 1.14 mg/dL).    No Known Allergies  Antimicrobials this admission: Ceftriaxone  1/6  x 1 Azithromycin  1/6 x 1 Unasyn  1/7 >   Microbiology results: 1/6 BCx: IP 1/6 Sputum: IP  1/6 resp panel: neg  Thank you for allowing pharmacy to be a part of this patient's care.  Leonor GORMAN Bash 11/20/2023 12:26 AM

## 2023-11-20 NOTE — Assessment & Plan Note (Signed)
 Repeat after rehydration Monitor on telemetry

## 2023-11-20 NOTE — Assessment & Plan Note (Signed)
 11-20-2023 chronic.

## 2023-11-20 NOTE — Discharge Instructions (Signed)

## 2023-11-20 NOTE — ED Notes (Signed)
 PT was cleaned by this RN before being taken upstairs

## 2023-11-20 NOTE — Assessment & Plan Note (Signed)
-  -   Will initiate: Steroid taper  -  Antibiotics azithromycin  - Albuterol   PRN, - scheduled duoneb,  -  Breo or Dulera  at discharge   -  Mucinex .  Titrate O2 to saturation >90%. Follow patients respiratory status.    VBG pending   Currently mentating well no evidence of symptomatic hypercarbia

## 2023-11-20 NOTE — Assessment & Plan Note (Signed)
-   Order Sensitive SSI    -  check TSH and HgA1C  - Hold by mouth medication

## 2023-11-20 NOTE — Assessment & Plan Note (Signed)
 In the setting of history of alcohol abuse. Check INR

## 2023-11-20 NOTE — Evaluation (Signed)
 Physical Therapy Evaluation Patient Details Name: [name] MRN: [id number] DOB: [birth year] Today's Date: 11/20/2023  History of Present Illness  63 y.o. male admitted 11/19/23 with SOB, cough. Workup for acute hypoxic respiratory failure, potential PNA. PMH includes COPD (2L O2 nightly), DM2, HTN, HLD, tobacco abuse, chronic pancreatitis, recurrent PNA.   Clinical Impression  Pt presents with an overall decrease in functional mobility secondary to above. PTA, pt reports indep without DME, primarily sedentary, lives with mother who assists with household tasks. Today, pt mod indep with bed-level mobility but adamantly declines sitting or OOB activity despite education/encouragement. Pt initially participatory though easily agitated by questions/requests and will not specify why he does not want to participate. Pt reports no concerns regarding his mobility status; if to remain admitted, will follow acutely to maximize functional mobility and independence if pt agreeable; do not expect any follow up needs (nor is pt interested in follow-up PT).    Resting SpO2 >/94% on 2L O2 [place]; HR 80s-90s      If plan is discharge home, recommend the following: A little help with walking and/or transfers;A little help with bathing/dressing/bathroom;Assistance with cooking/housework;Direct supervision/assist for medications management;Direct supervision/assist for financial management   Can travel by private vehicle        Equipment Recommendations  (TBD)  Recommendations for Other Services       Functional Status Assessment Patient has had a recent decline in their functional status and demonstrates the ability to make significant improvements in function in a reasonable and predictable amount of time.     Precautions / Restrictions Precautions Precautions: Fall;Other (comment) Precaution Comments: baseline wears 2L O2 nightly Restrictions Weight Bearing Restrictions Per Provider Order: No       Mobility  Bed Mobility Overal bed mobility: Needs Assistance Bed Mobility: Rolling Rolling: Modified independent (Device/Increase time)         General bed mobility comments: pt mod indep with rolling and scooting self up in bed (when cued to use BLEs), adamantly declines to sit EOB    Transfers                   General transfer comment: pt refusing OOB mobility    Ambulation/Gait                  Stairs            Wheelchair Mobility     Tilt Bed    Modified Rankin (Stroke Patients Only)       Balance                                             Pertinent Vitals/Pain Pain Assessment Pain Assessment: Faces Faces Pain Scale: Hurts a little bit Pain Location: generalized (does not specify) Pain Descriptors / Indicators: Discomfort, Grimacing Pain Intervention(s): Monitored during session    Home Living Family/patient expects to be discharged to:: Private residence Living Arrangements: Parent Available Help at Discharge: Family;Available 24 hours/day Type of Home: House Home Access: Stairs to enter   [employer] of Steps: 1   Home Layout: One level Home Equipment: None      Prior Function Prior Level of Function : Independent/Modified Independent             Mobility Comments: independent without DME, primarily sedentary ADLs Comments: reports indep with ADLs; per chart, mother performs iADLs. pt  not forthcoming with details     Extremity/Trunk Assessment   Upper Extremity Assessment Upper Extremity Assessment: Generalized weakness (observed bed-level gross strength >/ 3/5; able to reach bilateral arms overhead)    Lower Extremity Assessment Lower Extremity Assessment: Generalized weakness (observed bed-level gross strength >/ 3/5; able to perform partial range SLR and bilateral heel slides to assist with scooting self up in bed; noted muscle atrophy; refusing formal strength testing)        Communication   Communication Communication: No apparent difficulties  Cognition Arousal: Alert Behavior During Therapy: Flat affect Overall Cognitive Status: No family/caregiver present to determine baseline cognitive functioning                                 General Comments: pt initially answering questions and following commands appropriately, though flat affect. pt becoming more irritated during session eventually refusing to answer questions with minimal participation (leave me alone); pt refusing to answer why he does not want to participate        General Comments General comments (skin integrity, edema, etc.): pt received on 4L O2 [place] with SpO2 96%; SpO2 maintaining >/94% on 2L O2 [place]. HR 80s-90s, BP 122/84; RR reading up to 40s though unsure reliable reading as pt does not appear in respiratory distress. educ re: POC, activity recommendations, importance of OOB mobility, potential d/c needs. pt declining to stay on acute PT caseload, but also refusing other care therefore will plan to check back if pt to remain admitted to allow for further mobility assessment if pt agreeable; DO notified    Exercises     Assessment/Plan    PT Assessment Patient needs continued PT services  PT Problem List Decreased mobility;Decreased safety awareness;Cardiopulmonary status limiting activity;Decreased strength;Decreased activity tolerance       PT Treatment Interventions DME instruction;Gait training;Stair training;Functional mobility training;Therapeutic activities;Therapeutic exercise;Balance training;Patient/family education    PT Goals (Current goals can be found in the Care Plan section)  Acute Rehab PT Goals Patient Stated Goal: leave me alone PT Goal Formulation: With patient Time For Goal Achievement: 12/04/23 Potential to Achieve Goals: Fair    Frequency Min 1X/week     Co-evaluation               AM-PAC PT 6 Clicks Mobility  Outcome Measure  Help needed turning from your back to your side while in a flat bed without using bedrails?: None Help needed moving from lying on your back to sitting on the side of a flat bed without using bedrails?: A Little Help needed moving to and from a bed to a chair (including a wheelchair)?: A Little Help needed standing up from a chair using your arms (e.g., wheelchair or bedside chair)?: A Little Help needed to walk in hospital room?: Total Help needed climbing 3-5 steps with a railing? : Total 6 Click Score: 15    End of Session Equipment Utilized During Treatment: Oxygen  Activity Tolerance: Other (comment) (limited by decreased desire to participate, no reason specified) Patient left: with call bell/phone within reach;Other (comment);in bed (ED stretcher) Nurse Communication: Mobility status PT Visit Diagnosis: Other abnormalities of gait and mobility (R26.89)    Time: 9055-9042 PT Time Calculation (min) (ACUTE ONLY): 13 min   Charges:   PT Evaluation $PT Eval Low Complexity: 1 Low   PT General Charges $$ ACUTE PT VISIT: 1 Visit       Jeffren Dombek, PT, DPT Acute  Rehabilitation Services  Personal: Secure Chat Rehab Office: [phone]  Darice LITTIE Almas 11/20/2023, 12:19 PM

## 2023-11-20 NOTE — Assessment & Plan Note (Signed)
 Supportive measures currently not contributing

## 2023-11-20 NOTE — Assessment & Plan Note (Addendum)
 11-20-2023 present on admission. WBC 24.7K, lactic acid 3.9, RLL pneumonia on CXR, O2 sats 80% on 5 L/min. Baseline on 2L/min O2. 11-21-2023 continue with IV unasyn. Blood cx negative thus far

## 2023-11-20 NOTE — Assessment & Plan Note (Addendum)
 11-20-2023 monitor for withdrawal. On CIWA . Monitor his breathing as benzos could make his respiratory failure worse.

## 2023-11-20 NOTE — Assessment & Plan Note (Signed)
-  SIRS criteria met with  elevated white blood cell count,       Component Value Date/Time   WBC 24.7 (H) 11/19/2023 2245   LYMPHSABS 0.6 (L) 11/19/2023 2245   LYMPHSABS 2.2 07/22/2020 1030      RR >20 Today's Vitals   11/19/23 2051 11/19/23 2059 11/19/23 2100 11/19/23 2300  BP: 99/76  101/75 134/82  Pulse: 79  75 71  Resp: (!) 39  (!) 35 (!) 30  Temp: (!) 96.1 F (35.6 C)     TempSrc: Temporal     SpO2: (!) 80% 97% 100% 100%  Weight:      Height:      PainSc:         The recent clinical data is shown below. Vitals:   11/19/23 2051 11/19/23 2059 11/19/23 2100 11/19/23 2300  BP: 99/76  101/75 134/82  Pulse: 79  75 71  Resp: (!) 39  (!) 35 (!) 30  Temp: (!) 96.1 F (35.6 C)     TempSrc: Temporal     SpO2: (!) 80% 97% 100% 100%  Weight:      Height:         -Most likely source being:  pulmonary,      Patient meeting criteria for Severe sepsis with    evidence of end organ damage/organ dysfunction such as    elevated lactic acid >2     Component Value Date/Time   LATICACIDVEN 3.9 (HH) 11/19/2023 2245     SBP<90 mmhg or MAP < 65 mmhg,   Acute hypoxia requiring new supplemental oxygen , SpO2: 100 % O2 Flow Rate (L/min): 5 L/min    - Obtain serial lactic acid and procalcitonin level.  - Initiated IV antibiotics in ER: Antibiotics Given (last 72 hours)     Date/Time Action Medication Dose Rate   11/19/23 2354 New Bag/Given   azithromycin  (ZITHROMAX ) 500 mg in sodium chloride  0.9 % 250 mL IVPB 500 mg 250 mL/hr   11/19/23 2359 New Bag/Given   cefTRIAXone  (ROCEPHIN ) 1 g in sodium chloride  0.9 % 100 mL IVPB 1 g 200 mL/hr       Will continue  on : Unasyn  azithromycin    - await results of blood and urine culture  - Rehydrate aggressively  Intravenous fluids were administered,      12:50 AM

## 2023-11-20 NOTE — ED Notes (Signed)
 ED TO INPATIENT HANDOFF REPORT  ED Nurse Name and Phone #: Bryauna Byrum [phone]  S Name/Age/Gender [name] 63 y.o. male Room/Bed: 012C/012C  Code Status   Code Status: Full Code  Home/SNF/Other Home Patient oriented to: self, place, and situation Is this baseline? Yes   Triage Complete: Triage complete  Chief Complaint CAP (community acquired pneumonia) [J18.9]  Triage Note Pt via EMS from home c/o respiratory distress, 85% sat on 2L O2. Hx COPD with worsening lethargy and tachypnea throughout the day. Wheezing, rhonchi, and rales throughout bilateral lungs. BP 62 systolic per EMS.   Gave 2 duoneb treatments and 125mg  solu-medrol  with 500cc NS bolus en route. 22ga IV in left hand. Pt is agitated.    Allergies No Known Allergies  Level of Care/Admitting Diagnosis ED Disposition     ED Disposition  Admit   Condition  --   Comment  Hospital Area: [facility] [[id number]]  Level of Care: Progressive [102]  Admit to Progressive based on following criteria: MULTISYSTEM THREATS such as stable sepsis, metabolic/electrolyte imbalance with or without encephalopathy that is responding to early treatment.  Admit to Progressive based on following criteria: RESPIRATORY PROBLEMS hypoxemic/hypercapnic respiratory failure that is responsive to NIPPV (BiPAP) or High Flow Nasal Cannula (6-80 lpm). Frequent assessment/intervention, no > Q2 hrs < Q4 hrs, to maintain oxygenation and pulmonary hygiene.  May admit patient to [facility] or [facility] if equivalent level of care is available:: No  Covid Evaluation: Asymptomatic - no recent exposure (last 10 days) testing not required  Diagnosis: CAP (community acquired pneumonia) [[id number]]  Admitting Physician: DOUTOVA, ANASTASSIA [[id number]]  Attending Physician: DOUTOVA, ANASTASSIA [[id number]]  Certification:: I certify this patient will need inpatient services for at least 2 midnights  Expected Medical Readiness: 11/24/2023           B Medical/Surgery History Past Medical History:  Diagnosis Date   COPD (chronic obstructive pulmonary disease) (HCC)    Diabetes mellitus without complication (HCC)    Pancreatitis    Past Surgical History:  Procedure Laterality Date   NO PAST SURGERIES       A IV Location/Drains/Wounds Patient Lines/Drains/Airways Status     Active Line/Drains/Airways     Name Placement date Placement time Site Days   Peripheral IV 11/19/23 22 G 1 Left;Posterior Hand 11/19/23  2040  Hand  1   Peripheral IV 11/19/23 20 G 1 Anterior;Right Forearm 11/19/23  2245  Forearm  1   Wound / Incision (Open or Dehisced) 07/12/22 Skin tear Arm Left;Lower;Posterior open skin tear missing sub q tissue, red wound bed 07/12/22  1200  Arm  496            Intake/Output Last 24 hours  Intake/Output Summary (Last 24 hours) at 11/20/2023 1732 Last data filed at 11/20/2023 1710 Gross per 24 hour  Intake 1222.5 ml  Output --  Net 1222.5 ml    Labs/Imaging Results for orders placed or performed during the hospital encounter of 11/19/23 (from the past 48 hours)  CBC with Differential     Status: Abnormal   Collection Time: 11/19/23 10:45 PM  Result Value Ref Range   WBC 24.7 (H) 4.0 - 10.5 K/uL   RBC 4.79 4.22 - 5.81 MIL/uL   Hemoglobin 14.2 13.0 - 17.0 g/dL   HCT 55.6 60.9 - 47.9 %   MCV 92.5 80.0 - 100.0 fL   MCH 29.6 26.0 - 34.0 pg   MCHC 32.1 30.0 - 36.0 g/dL  RDW 15.5 11.5 - 15.5 %   Platelets 102 (L) 150 - 400 K/uL   nRBC 0.0 0.0 - 0.2 %   Neutrophils Relative % 93 %   Neutro Abs 22.9 (H) 1.7 - 7.7 K/uL   Lymphocytes Relative 2 %   Lymphs Abs 0.6 (L) 0.7 - 4.0 K/uL   Monocytes Relative 4 %   Monocytes Absolute 0.9 0.1 - 1.0 K/uL   Eosinophils Relative 0 %   Eosinophils Absolute 0.0 0.0 - 0.5 K/uL   Basophils Relative 0 %   Basophils Absolute 0.1 0.0 - 0.1 K/uL   Immature Granulocytes 1 %   Abs Immature Granulocytes 0.25 (H) 0.00 - 0.07 K/uL    Comment: Performed at [facility], [address], [place], [place] [address]  Comprehensive metabolic panel     Status: Abnormal   Collection Time: 11/19/23 10:45 PM  Result Value Ref Range   Sodium 136 135 - 145 mmol/L   Potassium 5.5 (H) 3.5 - 5.1 mmol/L   Chloride 84 (L) 98 - 111 mmol/L   CO2 36 (H) 22 - 32 mmol/L   Glucose, Bld 116 (H) 70 - 99 mg/dL    Comment: Glucose reference range applies only to samples taken after fasting for at least 8 hours.   BUN 52 (H) 8 - 23 mg/dL   Creatinine, Ser 8.85 0.61 - 1.24 mg/dL   Calcium  8.8 (L) 8.9 - 10.3 mg/dL   Total Protein 6.2 (L) 6.5 - 8.1 g/dL   Albumin  2.9 (L) 3.5 - 5.0 g/dL   AST 786 (H) 15 - 41 U/L   ALT 140 (H) 0 - 44 U/L   Alkaline Phosphatase 124 38 - 126 U/L   Total Bilirubin 1.8 (H) 0.0 - 1.2 mg/dL   GFR, Estimated >39 >39 mL/min    Comment: (NOTE) Calculated using the CKD-EPI Creatinine Equation (2021)    Anion gap 16 (H) 5 - 15    Comment: Performed at [facility], [address], [place], [place] [address]  Lactic acid, plasma     Status: Abnormal   Collection Time: 11/19/23 10:45 PM  Result Value Ref Range   Lactic Acid, Venous 3.9 (HH) 0.5 - 1.9 mmol/L    Comment: CRITICAL RESULT CALLED TO, READ BACK BY AND VERIFIED WITH IVAR CONE, RN AT [phone] 01.06.25 JLASIGAN Performed at [facility], [address] [place], [place] [address]   Brain natriuretic peptide     Status: Abnormal   Collection Time: 11/19/23 10:45 PM  Result Value Ref Range   B Natriuretic Peptide 957.7 (H) 0.0 - 100.0 pg/mL    Comment: Performed at [facility], [address] [place], [place] [address]  Procalcitonin     Status: None   Collection Time: 11/19/23 10:45 PM  Result Value Ref Range   Procalcitonin 2.56 ng/mL    Comment:        Interpretation: PCT > 2 ng/mL: Systemic infection (sepsis) is likely, unless other causes are known. (NOTE)       Sepsis PCT Algorithm           Lower Respiratory Tract                                       Infection PCT Algorithm    ----------------------------     ----------------------------  PCT < 0.25 ng/mL                PCT < 0.10 ng/mL          Strongly encourage             Strongly discourage   discontinuation of antibiotics    initiation of antibiotics    ----------------------------     -----------------------------       PCT 0.25 - 0.50 ng/mL            PCT 0.10 - 0.25 ng/mL               OR       >80% decrease in PCT            Discourage initiation of                                            antibiotics      Encourage discontinuation           of antibiotics    ----------------------------     -----------------------------         PCT >= 0.50 ng/mL              PCT 0.26 - 0.50 ng/mL               AND       <80% decrease in PCT              Encourage initiation of                                             antibiotics       Encourage continuation           of antibiotics    ----------------------------     -----------------------------        PCT >= 0.50 ng/mL                  PCT > 0.50 ng/mL               AND         increase in PCT                  Strongly encourage                                      initiation of antibiotics    Strongly encourage escalation           of antibiotics                                     -----------------------------                                           PCT <= 0.25 ng/mL  OR                                        > 80% decrease in PCT                                      Discontinue / Do not initiate                                             antibiotics  Performed at [facility], [address], [place], [place] [address]   CK     Status: None   Collection Time: 11/19/23 10:45 PM  Result Value Ref Range   Total CK 128 49 - 397 U/L    Comment: Performed at [facility], [address], [place], [place] [address]  Magnesium      Status: None    Collection Time: 11/19/23 10:45 PM  Result Value Ref Range   Magnesium  1.7 1.7 - 2.4 mg/dL    Comment: Performed at [facility], [address], [place], [place] [address]  Phosphorus     Status: Abnormal   Collection Time: 11/19/23 10:45 PM  Result Value Ref Range   Phosphorus 6.7 (H) 2.5 - 4.6 mg/dL    Comment: Performed at [facility], [address], [place], [place] [address]  Osmolality     Status: Abnormal   Collection Time: 11/19/23 10:45 PM  Result Value Ref Range   Osmolality 310 (H) 275 - 295 mOsm/kg    Comment: Performed at [facility], [address], [place], [place] [address]  TSH     Status: None   Collection Time: 11/19/23 10:45 PM  Result Value Ref Range   TSH 2.802 0.350 - 4.500 uIU/mL    Comment: Performed by a 3rd Generation assay with a functional sensitivity of <=0.01 uIU/mL. Performed at [facility], [address], [place], [place] [address]   Protime-INR     Status: Abnormal   Collection Time: 11/19/23 10:45 PM  Result Value Ref Range   Prothrombin Time 23.7 (H) 11.4 - 15.2 seconds   INR 2.1 (H) 0.8 - 1.2    Comment: (NOTE) INR goal varies based on device and disease states. Performed at [facility], [address], [place], [place] [address]   HIV Antibody (routine testing w rflx)     Status: None   Collection Time: 11/19/23 10:45 PM  Result Value Ref Range   HIV Screen 4th Generation wRfx Non Reactive Non Reactive    Comment: Performed at [facility], [address], [place], [place] [address]  Blood culture (routine x 2)     Status: None (Preliminary result)   Collection Time: 11/19/23 10:50 PM   Specimen: BLOOD RIGHT FOREARM  Result Value Ref Range   Specimen Description BLOOD RIGHT FOREARM    Special Requests      BOTTLES DRAWN AEROBIC AND ANAEROBIC Blood Culture results may not be optimal due to an inadequate volume of blood received in culture bottles   Culture      NO GROWTH < 12 HOURS Performed  at [facility]  Lab, [address] [place], [place] [address]    Report Status PENDING   Blood culture (routine x 2)     Status: None (Preliminary result)   Collection Time: 11/19/23 11:00 PM   Specimen: BLOOD  Result Value Ref Range   Specimen Description BLOOD RIGHT ANTECUBITAL    Special Requests      BOTTLES DRAWN AEROBIC AND ANAEROBIC Blood Culture results may not be optimal due to an inadequate volume of blood received in culture bottles   Culture      NO GROWTH < 12 HOURS Performed at [facility] Lab, [address], [place], [place] [address]    Report Status PENDING   Resp panel by RT-PCR (RSV, Flu A&B, Covid) Anterior Nasal Swab     Status: None   Collection Time: 11/19/23 11:10 PM   Specimen: Anterior Nasal Swab  Result Value Ref Range   SARS Coronavirus 2 by RT PCR NEGATIVE NEGATIVE   Influenza A by PCR NEGATIVE NEGATIVE   Influenza B by PCR NEGATIVE NEGATIVE    Comment: (NOTE) The Xpert Xpress SARS-CoV-2/FLU/RSV plus assay is intended as an aid in the diagnosis of influenza from Nasopharyngeal swab specimens and should not be used as a sole basis for treatment. Nasal washings and aspirates are unacceptable for Xpert Xpress SARS-CoV-2/FLU/RSV testing.  Fact Sheet for Patients: bloggercourse.com  Fact Sheet for Healthcare Providers: seriousbroker.it  This test is not yet approved or cleared by the [place]  FDA and has been authorized for detection and/or diagnosis of SARS-CoV-2 by FDA under an Emergency Use Authorization (EUA). This EUA will remain in effect (meaning this test can be used) for the duration of the COVID-19 declaration under Section 564(b)(1) of the Act, 21 U.S.C. section 360bbb-3(b)(1), unless the authorization is terminated or revoked.     Resp Syncytial Virus by PCR NEGATIVE NEGATIVE    Comment: (NOTE) Fact Sheet for Patients: bloggercourse.com  Fact Sheet  for Healthcare Providers: seriousbroker.it  This test is not yet approved or cleared by the [place]  FDA and has been authorized for detection and/or diagnosis of SARS-CoV-2 by FDA under an Emergency Use Authorization (EUA). This EUA will remain in effect (meaning this test can be used) for the duration of the COVID-19 declaration under Section 564(b)(1) of the Act, 21 U.S.C. section 360bbb-3(b)(1), unless the authorization is terminated or revoked.  Performed at [facility], [address] [place], [place] [address]   Lactic acid, plasma     Status: Abnormal   Collection Time: 11/20/23  1:37 AM  Result Value Ref Range   Lactic Acid, Venous 3.2 (HH) 0.5 - 1.9 mmol/L    Comment: CRITICAL VALUE NOTED. VALUE IS CONSISTENT WITH PREVIOUSLY REPORTED/CALLED VALUE Performed at [facility], [address] [place], [place] [address]   Ammonia     Status: Abnormal   Collection Time: 11/20/23  1:37 AM  Result Value Ref Range   Ammonia 59 (H) 9 - 35 umol/L    Comment: Performed at [facility], [address] [place], [place] [address]  Prealbumin     Status: Abnormal   Collection Time: 11/20/23  1:37 AM  Result Value Ref Range   Prealbumin <5 (L) 18 - 38 mg/dL    Comment: Performed at [facility], [address], [place], [place] [address]  Comprehensive metabolic panel     Status: Abnormal   Collection Time: 11/20/23  1:37 AM  Result Value Ref Range  Sodium 133 (L) 135 - 145 mmol/L   Potassium 5.8 (H) 3.5 - 5.1 mmol/L   Chloride 82 (L) 98 - 111 mmol/L   CO2 32 22 - 32 mmol/L   Glucose, Bld 107 (H) 70 - 99 mg/dL    Comment: Glucose reference range applies only to samples taken after fasting for at least 8 hours.   BUN 53 (H) 8 - 23 mg/dL   Creatinine, Ser 8.85 0.61 - 1.24 mg/dL   Calcium  8.7 (L) 8.9 - 10.3 mg/dL   Total Protein 5.7 (L) 6.5 - 8.1 g/dL   Albumin  2.9 (L) 3.5 - 5.0 g/dL   AST 777 (H) 15 - 41 U/L   ALT 137 (H)  0 - 44 U/L   Alkaline Phosphatase 140 (H) 38 - 126 U/L   Total Bilirubin 1.8 (H) 0.0 - 1.2 mg/dL   GFR, Estimated >39 >39 mL/min    Comment: (NOTE) Calculated using the CKD-EPI Creatinine Equation (2021)    Anion gap 19 (H) 5 - 15    Comment: Performed at [facility], [address] [place], [place] [address]  CBG monitoring, ED     Status: Abnormal   Collection Time: 11/20/23  1:58 AM  Result Value Ref Range   Glucose-Capillary 117 (H) 70 - 99 mg/dL    Comment: Glucose reference range applies only to samples taken after fasting for at least 8 hours.  Ethanol     Status: None   Collection Time: 11/20/23  4:09 AM  Result Value Ref Range   Alcohol , Ethyl (B) <10 <10 mg/dL    Comment: (NOTE) Lowest detectable limit for serum alcohol  is 10 mg/dL.  For medical purposes only. Performed at [facility], [address] [place], [place] [address]   Blood gas, venous     Status: Abnormal   Collection Time: 11/20/23  4:09 AM  Result Value Ref Range   pH, Ven 7.3 7.25 - 7.43   pCO2, Ven 82 (HH) 44 - 60 mmHg    Comment: CRITICAL RESULT CALLED TO, READ BACK BY AND VERIFIED WITH: TERECK SHALLINGSTER RN 11/20/23 0430 SGALLOWAY    pO2, Ven 48 (H) 32 - 45 mmHg   Bicarbonate 40.4 (H) 20.0 - 28.0 mmol/L   Acid-Base Excess 10.2 (H) 0.0 - 2.0 mmol/L   O2 Saturation 73.8 %   Patient temperature 36.5     Comment: Performed at [facility], [address] [place], [place] [address]  Lactic acid, plasma     Status: Abnormal   Collection Time: 11/20/23  4:09 AM  Result Value Ref Range   Lactic Acid, Venous 2.6 (HH) 0.5 - 1.9 mmol/L    Comment: CRITICAL VALUE NOTED. VALUE IS CONSISTENT WITH PREVIOUSLY REPORTED/CALLED VALUE Performed at [facility], [address] [place], [place] [address]   CBG monitoring, ED     Status: Abnormal   Collection Time: 11/20/23  5:00 AM  Result Value Ref Range   Glucose-Capillary 141 (H) 70 - 99 mg/dL    Comment: Glucose reference  range applies only to samples taken after fasting for at least 8 hours.  CBG monitoring, ED     Status: Abnormal   Collection Time: 11/20/23  8:21 AM  Result Value Ref Range   Glucose-Capillary 138 (H) 70 - 99 mg/dL    Comment: Glucose reference range applies only to samples taken after fasting for at least 8 hours.  Magnesium      Status: Abnormal  Collection Time: 11/20/23 10:56 AM  Result Value Ref Range   Magnesium  1.5 (L) 1.7 - 2.4 mg/dL    Comment: Performed at [facility], [address] [place], [place] [address]  Phosphorus     Status: Abnormal   Collection Time: 11/20/23 10:56 AM  Result Value Ref Range   Phosphorus 5.3 (H) 2.5 - 4.6 mg/dL    Comment: Performed at [facility], [address] [place], [place] [address]  Comprehensive metabolic panel     Status: Abnormal   Collection Time: 11/20/23 10:56 AM  Result Value Ref Range   Sodium 135 135 - 145 mmol/L   Potassium 5.3 (H) 3.5 - 5.1 mmol/L   Chloride 86 (L) 98 - 111 mmol/L   CO2 36 (H) 22 - 32 mmol/L   Glucose, Bld 153 (H) 70 - 99 mg/dL    Comment: Glucose reference range applies only to samples taken after fasting for at least 8 hours.   BUN 66 (H) 8 - 23 mg/dL   Creatinine, Ser 8.98 0.61 - 1.24 mg/dL   Calcium  8.2 (L) 8.9 - 10.3 mg/dL   Total Protein 5.4 (L) 6.5 - 8.1 g/dL   Albumin  2.6 (L) 3.5 - 5.0 g/dL   AST 857 (H) 15 - 41 U/L   ALT 127 (H) 0 - 44 U/L   Alkaline Phosphatase 112 38 - 126 U/L   Total Bilirubin 1.3 (H) 0.0 - 1.2 mg/dL   GFR, Estimated >39 >39 mL/min    Comment: (NOTE) Calculated using the CKD-EPI Creatinine Equation (2021)    Anion gap 13 5 - 15    Comment: Performed at [facility], [address] [place], [place] [address]  CBC     Status: Abnormal   Collection Time: 11/20/23 10:56 AM  Result Value Ref Range   WBC 23.7 (H) 4.0 - 10.5 K/uL   RBC 4.21 (L) 4.22 - 5.81 MIL/uL   Hemoglobin 12.3 (L) 13.0 - 17.0 g/dL   HCT 62.0 (L) 60.9 - 47.9 %   MCV 90.0 80.0 -  100.0 fL   MCH 29.2 26.0 - 34.0 pg   MCHC 32.5 30.0 - 36.0 g/dL   RDW 84.9 88.4 - 84.4 %   Platelets 94 (L) 150 - 400 K/uL    Comment: Immature Platelet Fraction may be clinically indicated, consider ordering this additional test [id number] REPEATED TO VERIFY    nRBC 0.1 0.0 - 0.2 %    Comment: Performed at [facility], [address] [place], [place] [address]  CBG monitoring, ED     Status: Abnormal   Collection Time: 11/20/23 11:59 AM  Result Value Ref Range   Glucose-Capillary 145 (H) 70 - 99 mg/dL    Comment: Glucose reference range applies only to samples taken after fasting for at least 8 hours.   Comment 1 Notify RN    Comment 2 Document in Chart   CBG monitoring, ED     Status: Abnormal   Collection Time: 11/20/23  3:38 PM  Result Value Ref Range   Glucose-Capillary 114 (H) 70 - 99 mg/dL    Comment: Glucose reference range applies only to samples taken after fasting for at least 8 hours.   DG Chest 2 View Result Date: 11/19/2023 CLINICAL DATA:  Shortness of breath and hypoxia, initial encounter EXAM: CHEST - 2 VIEW COMPARISON:  05/28/2023 FINDINGS: Cardiac shadow is within normal limits. Lungs are well aerated bilaterally. Patchy airspace opacity is noted  in the right base laterally new from the prior exam consistent with acute infiltrate. No acute bony abnormality is noted. IMPRESSION: Right basilar infiltrate. Electronically Signed   By: Oneil Devonshire M.D.   On: 11/19/2023 21:29    Pending Labs Unresulted Labs (From admission, onward)     Start     Ordered   11/21/23 0500  Protime-INR  Tomorrow morning,   R        11/20/23 1157   11/21/23 0500  Comprehensive metabolic panel  Tomorrow morning,   R        11/20/23 1157   11/21/23 0500  CBC with Differential/Platelet  Tomorrow morning,   R        11/20/23 1157   11/21/23 0500  Magnesium   Tomorrow morning,   R        11/20/23 1157   11/20/23 0700  MRSA Next Gen by PCR, Nasal  (COPD / Pneumonia / Cellulitis / Lower  Extremity Wound)  Once,   R        11/20/23 0659   11/20/23 0200  Vitamin B1  Once,   AD        11/20/23 0200   11/20/23 0022  Strep pneumoniae urinary antigen  Once,   R        11/20/23 0023   11/20/23 0021  Hemoglobin A1c  Add-on,   AD       Comments: To assess prior glycemic control    11/20/23 0020   11/20/23 0021  Culture, blood (routine x 2) Call MD if unable to obtain prior to antibiotics being given  BLOOD CULTURE X 2,   R (with TIMED occurrences)     Comments: If blood cultures drawn in Emergency Department - Do not draw and cancel order   Question Answer Comment  Patient immune status Normal   Release to patient Immediate      11/20/23 0023   11/20/23 0021  Expectorated Sputum Assessment w Gram Stain, Rflx to Resp Cult  Once,   R       Question:  Patient immune status  Answer:  Normal   11/20/23 0023   11/20/23 0021  Legionella Pneumophila Serogp 1 Ur Ag  Once,   R        11/20/23 0023   11/20/23 0020  Rapid urine drug screen (hospital performed)  ONCE - STAT,   STAT        11/20/23 0019   11/20/23 0020  Osmolality, urine  Once,   URGENT        11/20/23 0019   11/20/23 0020  Creatinine, urine, random  Once,   URGENT        11/20/23 0019   11/20/23 0020  Sodium, urine, random  Once,   URGENT        11/20/23 0019            Vitals/Pain Today's Vitals   11/20/23 0928 11/20/23 1115 11/20/23 1200 11/20/23 1430  BP: 106/88 113/65  (!) 124/90  Pulse: 82 85  85  Resp:  11  (!) 22  Temp:   (!) 97.5 F (36.4 C)   TempSrc:   Oral   SpO2:  94%  96%  Weight:      Height:      PainSc:        Isolation Precautions No active isolations  Medications Medications  albuterol  (VENTOLIN  HFA) 108 (90 Base) MCG/ACT inhaler 2 puff (has no administration in time range)  insulin  aspart (  novoLOG ) injection 0-9 Units ( Subcutaneous Not Given 11/20/23 1706)  ampicillin -sulbactam (UNASYN ) 1.5 g in sodium chloride  0.9 % 100 mL IVPB (0 g Intravenous Stopped 11/20/23 1508)   ipratropium-albuterol  (DUONEB) 0.5-2.5 (3) MG/3ML nebulizer solution 3 mL (3 mLs Nebulization Given 11/20/23 1438)  0.9 %  sodium chloride  infusion (0 mLs Intravenous Stopped 11/20/23 1710)  acetaminophen  (TYLENOL ) tablet 650 mg (has no administration in time range)    Or  acetaminophen  (TYLENOL ) suppository 650 mg (has no administration in time range)  ondansetron  (ZOFRAN ) tablet 4 mg (has no administration in time range)    Or  ondansetron  (ZOFRAN ) injection 4 mg (has no administration in time range)  methylPREDNISolone  sodium succinate  (SOLU-MEDROL ) 40 mg/mL injection 40 mg (40 mg Intravenous Given 11/20/23 0813)    Followed by  predniSONE  (DELTASONE ) tablet 40 mg (has no administration in time range)  fentaNYL  (SUBLIMAZE ) injection 12.5-50 mcg (has no administration in time range)  nicotine  (NICODERM CQ  - dosed in mg/24 hours) patch 14 mg (14 mg Transdermal Patch Applied 11/20/23 1246)  LORazepam  (ATIVAN ) tablet 1-4 mg (has no administration in time range)    Or  LORazepam  (ATIVAN ) injection 1-4 mg (has no administration in time range)  thiamine  (VITAMIN B1) tablet 100 mg ( Oral See Alternative 11/20/23 1045)    Or  thiamine  (VITAMIN B1) injection 100 mg (100 mg Intravenous Given 11/20/23 1045)  folic acid  (FOLVITE ) tablet 1 mg (1 mg Oral Patient Refused/Not Given 11/20/23 1045)  cefTRIAXone  (ROCEPHIN ) 1 g in sodium chloride  0.9 % 100 mL IVPB (0 g Intravenous Stopped 11/20/23 0108)  azithromycin  (ZITHROMAX ) 500 mg in sodium chloride  0.9 % 250 mL IVPB (0 mg Intravenous Stopped 11/20/23 0108)  sodium zirconium cyclosilicate  (LOKELMA ) packet 10 g (10 g Oral Given 11/20/23 0338)  phytonadione  (VITAMIN K) SQ injection 10 mg (10 mg Subcutaneous Given 11/20/23 1434)    Mobility UTA      Focused Assessments Cardiac Assessment Handoff:    Lab Results  Component Value Date   CKTOTAL 128 11/19/2023   CKMB 3.5 09/13/2010   TROPONINI <0.30 11/04/2013   Lab Results  Component Value Date   DDIMER (H)  09/15/2010    19.81        AT THE INHOUSE ESTABLISHED CUTOFF VALUE OF 0.48 ug/mL FEU, THIS ASSAY HAS BEEN DOCUMENTED IN THE LITERATURE TO HAVE A SENSITIVITY AND NEGATIVE PREDICTIVE VALUE OF AT LEAST 98 TO 99%.  THE TEST RESULT SHOULD BE CORRELATED WITH AN ASSESSMENT OF THE CLINICAL PROBABILITY OF DVT / VTE. REPEATED TO VERIFY SPECIMEN CHECKED FOR CLOTS   Does the Patient currently have chest pain? No    R Recommendations: See Admitting Provider Note   Report given to:   Additional Notes:

## 2023-11-20 NOTE — ED Notes (Addendum)
 Pt extremely rude. Refused to do nebulizer before eating. Demanded snacks before food tray delivered. Pt claims unable to urinate at this time.

## 2023-11-20 NOTE — Assessment & Plan Note (Signed)
 11-20-2023 continue with SSI.

## 2023-11-20 NOTE — Assessment & Plan Note (Signed)
 CIWA protocol ordered Monitor for any sign of EtOh withdrawal

## 2023-11-20 NOTE — Hospital Course (Signed)
 HPI: [name] is a 63 y.o. male with medical history significant of COPD, DM2, chronic pancreatitis, HTN, HLD, alcohol  abuse, tobacco abuse     Presented with cough worsening shortness of breath Increased SOB, hx of EtOH and COpD he sleeps on 2L o2 but does not use during he day Pt continue to smoke Has history of chronic recurrent pneumonias for which she also follows with pulmonology History of chronic alcohol  abuse and has interfere his medical treatment in the past Today he comes in with respiratory distress satting 85% on 2 L at home Has been feeling more lethargic and tachypneic Noted to have some wheezing and coughing crackles Initially when EMS arrived systolic blood pressure down to 62 He received 2 DuoNeb treatments and a dose of Solu-Medrol  with some improvement    Reports he does not remember when was the last time he drank EtOH Thinks it has been a while  Still smokes Does not know if he takes his meds  And unsure if he has a fever   Denies significant ETOH intake   Does  smoke  but interested in quitting   Significant Events: Admitted 11/19/2023 for acute on chronic respiratory failure with hypoxia and hypercapnia   Significant Labs: WBC 24.7K, HgB 14.2, lactic acid 3.9, K 5.5, bicarb 36, BUN 51, Scr 1.14, AST 213, ALT 140 BNP 957, INR 2.1 VBG 7.3, PCO2 82, PO2 48  Significant Imaging Studies: CXR right basilar infiltrate  Antibiotic Therapy: Anti-infectives (From admission, onward)    Start     Dose/Rate Route Frequency Ordered Stop   11/21/23 0000  azithromycin  (ZITHROMAX ) 500 mg in sodium chloride  0.9 % 250 mL IVPB  Status:  Discontinued        500 mg 250 mL/hr over 60 Minutes Intravenous Every 24 hours 11/20/23 0023 11/20/23 0110   11/20/23 0045  ampicillin -sulbactam (UNASYN ) 1.5 g in sodium chloride  0.9 % 100 mL IVPB        1.5 g 200 mL/hr over 30 Minutes Intravenous Every 6 hours 11/20/23 0030     11/19/23 2200  cefTRIAXone  (ROCEPHIN ) 1 g in  sodium chloride  0.9 % 100 mL IVPB        1 g 200 mL/hr over 30 Minutes Intravenous  Once 11/19/23 2154 11/20/23 0108   11/19/23 2200  azithromycin  (ZITHROMAX ) 500 mg in sodium chloride  0.9 % 250 mL IVPB        500 mg 250 mL/hr over 60 Minutes Intravenous  Once 11/19/23 2154 11/20/23 0108       Procedures:   Consultants:

## 2023-11-21 ENCOUNTER — Inpatient Hospital Stay (HOSPITAL_COMMUNITY): Payer: Medicaid Other

## 2023-11-21 ENCOUNTER — Encounter (HOSPITAL_COMMUNITY): Payer: Self-pay | Admitting: Internal Medicine

## 2023-11-21 DIAGNOSIS — E43 Unspecified severe protein-calorie malnutrition: Secondary | ICD-10-CM | POA: Insufficient documentation

## 2023-11-21 DIAGNOSIS — J189 Pneumonia, unspecified organism: Secondary | ICD-10-CM | POA: Diagnosis not present

## 2023-11-21 DIAGNOSIS — K122 Cellulitis and abscess of mouth: Secondary | ICD-10-CM | POA: Insufficient documentation

## 2023-11-21 DIAGNOSIS — A419 Sepsis, unspecified organism: Secondary | ICD-10-CM | POA: Diagnosis not present

## 2023-11-21 DIAGNOSIS — J9621 Acute and chronic respiratory failure with hypoxia: Secondary | ICD-10-CM | POA: Diagnosis not present

## 2023-11-21 DIAGNOSIS — J441 Chronic obstructive pulmonary disease with (acute) exacerbation: Secondary | ICD-10-CM | POA: Diagnosis not present

## 2023-11-21 LAB — HEPATIC FUNCTION PANEL
ALT: 126 U/L — ABNORMAL HIGH (ref 0–44)
AST: 87 U/L — ABNORMAL HIGH (ref 15–41)
Albumin: 2.8 g/dL — ABNORMAL LOW (ref 3.5–5.0)
Alkaline Phosphatase: 125 U/L (ref 38–126)
Bilirubin, Direct: 0.4 mg/dL — ABNORMAL HIGH (ref 0.0–0.2)
Indirect Bilirubin: 1 mg/dL — ABNORMAL HIGH (ref 0.3–0.9)
Total Bilirubin: 1.4 mg/dL — ABNORMAL HIGH (ref 0.0–1.2)
Total Protein: 6.1 g/dL — ABNORMAL LOW (ref 6.5–8.1)

## 2023-11-21 LAB — HEMOGLOBIN A1C
Hgb A1c MFr Bld: 6.8 % — ABNORMAL HIGH (ref 4.8–5.6)
Mean Plasma Glucose: 148 mg/dL

## 2023-11-21 LAB — GLUCOSE, CAPILLARY
Glucose-Capillary: 156 mg/dL — ABNORMAL HIGH (ref 70–99)
Glucose-Capillary: 157 mg/dL — ABNORMAL HIGH (ref 70–99)
Glucose-Capillary: 243 mg/dL — ABNORMAL HIGH (ref 70–99)
Glucose-Capillary: 215 mg/dL — ABNORMAL HIGH (ref 70–99)
Glucose-Capillary: 110 mg/dL — ABNORMAL HIGH (ref 70–99)

## 2023-11-21 LAB — CBC WITH DIFFERENTIAL/PLATELET
Abs Immature Granulocytes: 0.41 10*3/uL — ABNORMAL HIGH (ref 0.00–0.07)
Basophils Absolute: 0.1 10*3/uL (ref 0.0–0.1)
Basophils Relative: 1 %
Eosinophils Absolute: 0 10*3/uL (ref 0.0–0.5)
Eosinophils Relative: 0 %
HCT: 41.5 % (ref 39.0–52.0)
Hemoglobin: 13.9 g/dL (ref 13.0–17.0)
Immature Granulocytes: 2 %
Lymphocytes Relative: 2 %
Lymphs Abs: 0.3 10*3/uL — ABNORMAL LOW (ref 0.7–4.0)
MCH: 29.9 pg (ref 26.0–34.0)
MCHC: 33.5 g/dL (ref 30.0–36.0)
MCV: 89.2 fL (ref 80.0–100.0)
Monocytes Absolute: 0.7 10*3/uL (ref 0.1–1.0)
Monocytes Relative: 4 %
Neutro Abs: 17.8 10*3/uL — ABNORMAL HIGH (ref 1.7–7.7)
Neutrophils Relative %: 91 %
Platelets: 87 10*3/uL — ABNORMAL LOW (ref 150–400)
RBC: 4.65 MIL/uL (ref 4.22–5.81)
RDW: 15.4 % (ref 11.5–15.5)
WBC: 19.4 10*3/uL — ABNORMAL HIGH (ref 4.0–10.5)
nRBC: 0.2 % (ref 0.0–0.2)

## 2023-11-21 LAB — BASIC METABOLIC PANEL
Anion gap: 12 (ref 5–15)
BUN: 55 mg/dL — ABNORMAL HIGH (ref 8–23)
CO2: 39 mmol/L — ABNORMAL HIGH (ref 22–32)
Calcium: 8.3 mg/dL — ABNORMAL LOW (ref 8.9–10.3)
Chloride: 88 mmol/L — ABNORMAL LOW (ref 98–111)
Creatinine, Ser: 0.52 mg/dL — ABNORMAL LOW (ref 0.61–1.24)
GFR, Estimated: >60 mL/min (ref >60)
Glucose, Bld: 158 mg/dL — ABNORMAL HIGH (ref 70–99)
Potassium: 4.5 mmol/L (ref 3.5–5.1)
Sodium: 139 mmol/L (ref 135–145)

## 2023-11-21 LAB — MAGNESIUM: Magnesium: 1.4 mg/dL — ABNORMAL LOW (ref 1.7–2.4)

## 2023-11-21 LAB — PROTIME-INR
INR: 1.7 — ABNORMAL HIGH (ref 0.8–1.2)
Prothrombin Time: 19.8 s — ABNORMAL HIGH (ref 11.4–15.2)

## 2023-11-21 MED ORDER — MAGNESIUM SULFATE 4 GM/100ML IV SOLN
4.0000 g | Freq: Once | INTRAVENOUS | Status: AC
  Administered 2023-11-21: 4 g via INTRAVENOUS
  Filled 2023-11-21: qty 100

## 2023-11-21 MED ORDER — SODIUM CHLORIDE 0.9 % IV SOLN
INTRAVENOUS | Status: DC
Start: 2023-11-21 — End: 2023-11-22

## 2023-11-21 MED ORDER — VITAMIN K1 10 MG/ML IJ SOLN
10.0000 mg | Freq: Once | INTRAMUSCULAR | Status: AC
  Administered 2023-11-21: 10 mg via SUBCUTANEOUS
  Filled 2023-11-21: qty 1

## 2023-11-21 MED ORDER — SACCHAROMYCES BOULARDII 250 MG PO CAPS
250.0000 mg | ORAL_CAPSULE | Freq: Two times a day (BID) | ORAL | Status: DC
Start: 2023-11-21 — End: 2023-11-21
  Administered 2023-11-21: 250 mg via ORAL
  Filled 2023-11-21: qty 1

## 2023-11-21 MED ORDER — IPRATROPIUM-ALBUTEROL 0.5-2.5 (3) MG/3ML IN SOLN: 3.0000 mL | Freq: Four times a day (QID) | RESPIRATORY_TRACT | Status: DC | PRN

## 2023-11-21 MED ORDER — IOHEXOL 350 MG/ML SOLN
75.0000 mL | Freq: Once | INTRAVENOUS | Status: AC | PRN
  Administered 2023-11-21: 75 mL via INTRAVENOUS

## 2023-11-21 MED ORDER — LOPERAMIDE HCL 2 MG PO CAPS: 4.0000 mg | ORAL_CAPSULE | Freq: Every day | ORAL | Status: DC | PRN

## 2023-11-21 MED ORDER — GERHARDT'S BUTT CREAM
TOPICAL_CREAM | CUTANEOUS | Status: DC | PRN
  Filled 2023-11-21 (×2): qty 60

## 2023-11-21 MED ORDER — METHYLPREDNISOLONE SODIUM SUCC 40 MG IJ SOLR
40.0000 mg | Freq: Every day | INTRAMUSCULAR | Status: DC
Start: 2023-11-21 — End: 2023-11-23
  Administered 2023-11-21 – 2023-11-23 (×3): 40 mg via INTRAVENOUS
  Filled 2023-11-21 (×3): qty 1

## 2023-11-21 MED ORDER — ORAL CARE MOUTH RINSE: 15.0000 mL | OROMUCOSAL | Status: DC | PRN

## 2023-11-21 NOTE — Progress Notes (Signed)
 PROGRESS NOTE    [name]  FMW:[id number] DOB: 04-22-1961 DOA: 11/19/2023 PCP: Vicci Barnie NOVAK, MD  Subjective: Pt seen and examined.  RN just finished orally suctioning lots of thick, green sputum he coughed up. RN thinks he may have aspirated again.  Having loose stools since IV ABX started. Remains on 4 L/min supplemental O2. Moderate labored breathing.   Hospital Course: HPI: [name] is a 63 y.o. male with medical history significant of COPD, DM2, chronic pancreatitis, HTN, HLD, alcohol  abuse, tobacco abuse     Presented with cough worsening shortness of breath Increased SOB, hx of EtOH and COpD he sleeps on 2L o2 but does not use during he day Pt continue to smoke Has history of chronic recurrent pneumonias for which she also follows with pulmonology History of chronic alcohol  abuse and has interfere his medical treatment in the past Today he comes in with respiratory distress satting 85% on 2 L at home Has been feeling more lethargic and tachypneic Noted to have some wheezing and coughing crackles Initially when EMS arrived systolic blood pressure down to 62 He received 2 DuoNeb treatments and a dose of Solu-Medrol  with some improvement    Reports he does not remember when was the last time he drank EtOH Thinks it has been a while  Still smokes Does not know if he takes his meds  And unsure if he has a fever   Denies significant ETOH intake   Does  smoke  but interested in quitting   Significant Events: Admitted 11/19/2023 for acute on chronic respiratory failure with hypoxia and hypercapnia   Significant Labs: WBC 24.7K, HgB 14.2, lactic acid 3.9, K 5.5, bicarb 36, BUN 51, Scr 1.14, AST 213, ALT 140 BNP 957, INR 2.1 VBG 7.3, PCO2 82, PO2 48  Significant Imaging Studies: CXR right basilar infiltrate  Antibiotic Therapy: Anti-infectives (From admission, onward)    Start     Dose/Rate Route Frequency Ordered Stop   11/21/23 0000   azithromycin  (ZITHROMAX ) 500 mg in sodium chloride  0.9 % 250 mL IVPB  Status:  Discontinued        500 mg 250 mL/hr over 60 Minutes Intravenous Every 24 hours 11/20/23 0023 11/20/23 0110   11/20/23 0045  ampicillin -sulbactam (UNASYN ) 1.5 g in sodium chloride  0.9 % 100 mL IVPB        1.5 g 200 mL/hr over 30 Minutes Intravenous Every 6 hours 11/20/23 0030     11/19/23 2200  cefTRIAXone  (ROCEPHIN ) 1 g in sodium chloride  0.9 % 100 mL IVPB        1 g 200 mL/hr over 30 Minutes Intravenous  Once 11/19/23 2154 11/20/23 0108   11/19/23 2200  azithromycin  (ZITHROMAX ) 500 mg in sodium chloride  0.9 % 250 mL IVPB        500 mg 250 mL/hr over 60 Minutes Intravenous  Once 11/19/23 2154 11/20/23 0108       Procedures:   Consultants:     Assessment and Plan: * CAP (community acquired pneumonia) - RLL 11-20-2023 has RLL with sepsis. On Tx for potential aspiration pneumonia. 11-21-2023 likely aspiration pneumonia. On IV unasyn . Will check CT chest to make sure he isn't developing empyema/lung abscess. Verified that he was discharged to home in July 2024 on home O2 @ 2 L/min  Sepsis with acute organ dysfunction without septic shock (HCC) 11-20-2023 present on admission. WBC 24.7K, lactic acid 3.9, RLL pneumonia on CXR, O2 sats 80% on 5 L/min. Baseline on 2L/min O2.  11-21-2023 continue with IV unasyn . Blood cx negative thus far  Acute on chronic respiratory failure with hypoxia and hypercapnia (HCC) 11-20-2023 was discharged home in 05-2023 on 2 L/min O2. Presented with O2 sats 80% on 5 L/min. Currently on 3 L/min. VBG shows PCO2 of 82.  Worsening of hypoxia and hypercapnia due to COPD exacerbation and pneumonia. Continue with supplemental O2 for now. Not in any respiratory distress. Can hold off on NIV/Bipap for now. Keep O2 sats 88-92%.  11-21-2023 continue with supplemental O2. Continue with oral/NT suctioning as needed.  Hyperkalemia 11-20-2023 initial K 5.5, repeat 5.8.  11 AM K of 5.3. continue  to monitor. 11-21-2023 resolved. K 4.5 today  COPD with acute exacerbation (HCC) 11-20-2023 continue with IV solumedrol and transition to po prednisone . Continue with nebs. 11-21-2023 continue with po prednisone  40 mg qday and scheduled nebs.  Elevated LFTs 11-20-2023 AST 142, ALT 127.  Slightly improved since yesterday. Doubt infectious hepatitis. Could be related to ETOH. Continue to monitor. 11-21-2023 add-on hepatic function panel. Repeat CMP in AM.  Type 2 diabetes mellitus with hyperglycemia (HCC) 11-20-2023 continue with SSI.  Alcohol  abuse 11-20-2023 monitor for withdrawal. On CIWA . Monitor his breathing as benzos could make his respiratory failure worse.  Chronic pancreatitis, unspecified pancreatitis type (HCC) 11-20-2023 chronic.  TOBACCO ABUSE 11-20-2023 chronic. Pt has been advised to quit.  Elevated INR 11-20-2023 no reported systemic anticoagulants on his med list.  He was not discharged to home on anticoagulants in 05-2023. Will give 10 mg SQ vitamin K and repeat IN in AM. Could be nutritional deficiency. 11-21-2023 received 10 mg SQ vitamin K yesterday. INR down to 1.7. likely related to nutritional deficiency vs etoh abuse. Will give another 10 mg SQ vitamin K.   DVT prophylaxis: SCDs Start: 11/20/23 0700     Code Status: Full Code Family Communication: no family at bedside Disposition Plan: home vs SNF Reason for continuing need for hospitalization: remains on IV Abx.  Objective: Vitals:   11/21/23 0416 11/21/23 0725 11/21/23 0743 11/21/23 0800  BP: (!) 142/90  (!) 132/90 124/84  Pulse: 92  80 (!) 111  Resp: 17  (!) 36 (!) 23  Temp:    98.2 F (36.8 C)  TempSrc:    Oral  SpO2: 96% 93% 96% (!) 88%  Weight:      Height:        Intake/Output Summary (Last 24 hours) at 11/21/2023 0941 Last data filed at 11/21/2023 0425 Gross per 24 hour  Intake 1011.13 ml  Output 950 ml  Net 61.13 ml   Filed Weights   11/19/23 2044 11/20/23 1816  Weight: 59 kg  52.4 kg    Examination:  Physical Exam Vitals and nursing note reviewed.  Constitutional:      General: He is not in acute distress.    Appearance: He is ill-appearing. He is not diaphoretic.  HENT:     Head: Normocephalic and atraumatic.     Nose: Nose normal.  Eyes:     General: No scleral icterus. Cardiovascular:     Rate and Rhythm: Regular rhythm. Tachycardia present.  Pulmonary:     Effort: Respiratory distress present.     Breath sounds: No rhonchi.     Comments: Coughing up thick, mucoid, green secretions Abdominal:     General: Abdomen is flat. Bowel sounds are normal.     Palpations: Abdomen is soft.  Musculoskeletal:     Right lower leg: No edema.     Left lower  leg: No edema.  Skin:    General: Skin is warm and dry.     Capillary Refill: Capillary refill takes less than 2 seconds.  Neurological:     Mental Status: He is alert.     Comments: Talking on the phone with his family     Data Reviewed: I have personally reviewed following labs and imaging studies  CBC: Recent Labs  Lab 11/19/23 2245 11/20/23 1056 11/21/23 0231  WBC 24.7* 23.7* 19.4*  NEUTROABS 22.9*  --  17.8*  HGB 14.2 12.3* 13.9  HCT 44.3 37.9* 41.5  MCV 92.5 90.0 89.2  PLT 102* 94* 87*   Basic Metabolic Panel: Recent Labs  Lab 11/19/23 2245 11/20/23 0137 11/20/23 1056 11/21/23 0742  NA 136 133* 135 139  K 5.5* 5.8* 5.3* 4.5  CL 84* 82* 86* 88*  CO2 36* 32 36* 39*  GLUCOSE 116* 107* 153* 158*  BUN 52* 53* 66* 55*  CREATININE 1.14 1.14 1.01 0.52*  CALCIUM  8.8* 8.7* 8.2* 8.3*  MG 1.7  --  1.5* 1.4*  PHOS 6.7*  --  5.3*  --    GFR: Estimated Creatinine Clearance: 69.1 mL/min (A) (by C-G formula based on SCr of 0.52 mg/dL (L)). Liver Function Tests: Recent Labs  Lab 11/19/23 2245 11/20/23 0137 11/20/23 1056  AST 213* 222* 142*  ALT 140* 137* 127*  ALKPHOS 124 140* 112  BILITOT 1.8* 1.8* 1.3*  PROT 6.2* 5.7* 5.4*  ALBUMIN  2.9* 2.9* 2.6*    Recent Labs  Lab  11/20/23 0137  AMMONIA 59*   Coagulation Profile: Recent Labs  Lab 11/19/23 2245 11/21/23 0231  INR 2.1* 1.7*   Cardiac Enzymes: Recent Labs  Lab 11/19/23 2245  CKTOTAL 128   BNP (last 3 results) Recent Labs    05/29/23 0731 11/19/23 2245  BNP 207.3* 957.7*   HbA1C: Recent Labs    11/20/23 0409  HGBA1C 6.8*   CBG: Recent Labs  Lab 11/20/23 1538 11/20/23 2032 11/20/23 2342 11/21/23 0348 11/21/23 0824  GLUCAP 114* 117* 187* 156* 157*   Thyroid  Function Tests: Recent Labs    11/19/23 2245  TSH 2.802   Sepsis Labs: Recent Labs  Lab 11/19/23 2245 11/20/23 0137 11/20/23 0409  PROCALCITON 2.56  --   --   LATICACIDVEN 3.9* 3.2* 2.6*    Recent Results (from the past 240 hours)  Blood culture (routine x 2)     Status: None (Preliminary result)   Collection Time: 11/19/23 10:50 PM   Specimen: BLOOD RIGHT FOREARM  Result Value Ref Range Status   Specimen Description BLOOD RIGHT FOREARM  Final   Special Requests   Final    BOTTLES DRAWN AEROBIC AND ANAEROBIC Blood Culture results may not be optimal due to an inadequate volume of blood received in culture bottles   Culture   Final    NO GROWTH 2 DAYS Performed at [facility], [address], [place], [place] [address]    Report Status PENDING  Incomplete  Blood culture (routine x 2)     Status: None (Preliminary result)   Collection Time: 11/19/23 11:00 PM   Specimen: BLOOD  Result Value Ref Range Status   Specimen Description BLOOD RIGHT ANTECUBITAL  Final   Special Requests   Final    BOTTLES DRAWN AEROBIC AND ANAEROBIC Blood Culture results may not be optimal due to an inadequate volume of blood received in culture bottles   Culture   Final    NO GROWTH 2 DAYS  Performed at [facility], [address], [place], [place] [address]    Report Status PENDING  Incomplete  Resp panel by RT-PCR (RSV, Flu A&B, Covid) Anterior Nasal Swab     Status: None   Collection Time: 11/19/23 11:10 PM    Specimen: Anterior Nasal Swab  Result Value Ref Range Status   SARS Coronavirus 2 by RT PCR NEGATIVE NEGATIVE Final   Influenza A by PCR NEGATIVE NEGATIVE Final   Influenza B by PCR NEGATIVE NEGATIVE Final    Comment: (NOTE) The Xpert Xpress SARS-CoV-2/FLU/RSV plus assay is intended as an aid in the diagnosis of influenza from Nasopharyngeal swab specimens and should not be used as a sole basis for treatment. Nasal washings and aspirates are unacceptable for Xpert Xpress SARS-CoV-2/FLU/RSV testing.  Fact Sheet for Patients: bloggercourse.com  Fact Sheet for Healthcare Providers: seriousbroker.it  This test is not yet approved or cleared by the [place]  FDA and has been authorized for detection and/or diagnosis of SARS-CoV-2 by FDA under an Emergency Use Authorization (EUA). This EUA will remain in effect (meaning this test can be used) for the duration of the COVID-19 declaration under Section 564(b)(1) of the Act, 21 U.S.C. section 360bbb-3(b)(1), unless the authorization is terminated or revoked.     Resp Syncytial Virus by PCR NEGATIVE NEGATIVE Final    Comment: (NOTE) Fact Sheet for Patients: bloggercourse.com  Fact Sheet for Healthcare Providers: seriousbroker.it  This test is not yet approved or cleared by the [place]  FDA and has been authorized for detection and/or diagnosis of SARS-CoV-2 by FDA under an Emergency Use Authorization (EUA). This EUA will remain in effect (meaning this test can be used) for the duration of the COVID-19 declaration under Section 564(b)(1) of the Act, 21 U.S.C. section 360bbb-3(b)(1), unless the authorization is terminated or revoked.  Performed at [facility], [address] [place], [place] [address]   Culture, blood (routine x 2) Call MD if unable to obtain prior to antibiotics being given     Status: None  (Preliminary result)   Collection Time: 11/20/23  1:40 AM   Specimen: BLOOD  Result Value Ref Range Status   Specimen Description BLOOD RIGHT ANTECUBITAL  Final   Special Requests   Final    BOTTLES DRAWN AEROBIC AND ANAEROBIC Blood Culture results may not be optimal due to an inadequate volume of blood received in culture bottles   Culture   Final    NO GROWTH 1 DAY Performed at [facility], [address], [place], [place] [address]    Report Status PENDING  Incomplete  Culture, blood (routine x 2) Call MD if unable to obtain prior to antibiotics being given     Status: None (Preliminary result)   Collection Time: 11/20/23  1:55 AM   Specimen: BLOOD  Result Value Ref Range Status   Specimen Description BLOOD LEFT ANTECUBITAL  Final   Special Requests   Final    BOTTLES DRAWN AEROBIC AND ANAEROBIC Blood Culture results may not be optimal due to an inadequate volume of blood received in culture bottles   Culture   Final    NO GROWTH 1 DAY Performed at [facility], [address] [place], [place] [address]    Report Status PENDING  Incomplete  MRSA Next Gen by PCR, Nasal     Status: None   Collection Time: 11/20/23  7:00 AM   Specimen: Nasal Mucosa; Nasal Swab  Result Value Ref Range  Status   MRSA by PCR Next Gen NOT DETECTED NOT DETECTED Final    Comment: (NOTE) The GeneXpert MRSA Assay (FDA approved for NASAL specimens only), is one component of a comprehensive MRSA colonization surveillance program. It is not intended to diagnose MRSA infection nor to guide or monitor treatment for MRSA infections. Test performance is not FDA approved in patients less than 72 years old. Performed at [facility], [address], [place], [place] [address]      Radiology Studies: DG Chest 2 View Result Date: 11/19/2023 CLINICAL DATA:  Shortness of breath and hypoxia, initial encounter EXAM: CHEST - 2 VIEW COMPARISON:  05/28/2023 FINDINGS: Cardiac shadow is within normal  limits. Lungs are well aerated bilaterally. Patchy airspace opacity is noted in the right base laterally new from the prior exam consistent with acute infiltrate. No acute bony abnormality is noted. IMPRESSION: Right basilar infiltrate. Electronically Signed   By: Oneil Devonshire M.D.   On: 11/19/2023 21:29    Scheduled Meds:  folic acid   1 mg Oral Daily   insulin  aspart  0-9 Units Subcutaneous Q4H   ipratropium-albuterol   3 mL Nebulization Q6H   nicotine   14 mg Transdermal Daily   phytonadione   10 mg Subcutaneous Once   predniSONE   40 mg Oral Q breakfast   saccharomyces boulardii  250 mg Oral BID   thiamine   100 mg Oral Daily   Or   thiamine   100 mg Intravenous Daily   Continuous Infusions:  ampicillin -sulbactam (UNASYN ) IV 1.5 g (11/21/23 0417)     LOS: 1 day   Time spent: 45 minutes  Camellia Door, DO  [facility]  11/21/2023, 9:41 AM

## 2023-11-21 NOTE — Progress Notes (Signed)
   Reviewed CT soft tissue neck. Pt with submandibular abscess from odontogenic source. No dental coverage at this time. Spoke with ENT. They do not drain odontogenic abscesses at this requires pt's decayed tooth to also be removed.  Spoke with CMO about options to transfer pt to tertiary care center with oral surgery coverage that can drain submandibular abscess and remove decayed tooth.  Remains on IV Unasyn  for now. ID pharmacist agreed with abx coverage.  Pt remains NPO.  Start IVF.  Camellia Door, DO [facility]

## 2023-11-21 NOTE — Evaluation (Signed)
 Occupational Therapy Evaluation Patient Details Name: [name] MRN: [id number] DOB: 1961-06-16 Today's Date: 11/21/2023   History of Present Illness 63 y.o. male admitted 11/19/23 with SOB, cough. Workup for acute hypoxic respiratory failure, potential PNA. PMH includes COPD (2L O2 nightly), DM2, HTN, HLD, tobacco abuse, chronic pancreatitis, recurrent PNA.   Clinical Impression   Patient presenting with flat affect and increased time needed to answer all questions. Patient intially agreeable to OOB activity, however then unware of bowel movement requiring total care to complete peri-care at bed level. Patient with productive cough throughout, refusing suction despite encouragement from OT. Given current level of function, patient would benefit from therapy due to decreased activity tolerance. Per PT patient is refusing therapy services. OT deferring to case mangement for placement.       If plan is discharge home, recommend the following: A lot of help with walking and/or transfers;A lot of help with bathing/dressing/bathroom;Assistance with cooking/housework;Direct supervision/assist for medications management;Direct supervision/assist for financial management;Assist for transportation;Help with stairs or ramp for entrance;Supervision due to cognitive status    Functional Status Assessment  Patient has had a recent decline in their functional status and demonstrates the ability to make significant improvements in function in a reasonable and predictable amount of time.  Equipment Recommendations  Other (comment) (will continue to assess)    Recommendations for Other Services       Precautions / Restrictions Precautions Precautions: Fall;Other (comment) Precaution Comments: baseline wears 2L O2 nightly Restrictions Weight Bearing Restrictions Per Provider Order: No      Mobility Bed Mobility Overal bed mobility: Needs Assistance Bed Mobility: Rolling Rolling: Modified  independent (Device/Increase time)         General bed mobility comments: pt mod indep with rolling and scooting self up in bed    Transfers Overall transfer level: Needs assistance                 General transfer comment: pt refusing OOB mobility      Balance Overall balance assessment: Mild deficits observed, not formally tested                                         ADL either performed or assessed with clinical judgement   ADL Overall ADL's : Needs assistance/impaired Eating/Feeding: NPO   Grooming: Set up;Bed level   Upper Body Bathing: Minimal assistance;Bed level   Lower Body Bathing: Total assistance;Bed level   Upper Body Dressing : Minimal assistance;Bed level   Lower Body Dressing: Total assistance;Bed level       Toileting- Clothing Manipulation and Hygiene: Total assistance;Bed level Toileting - Clothing Manipulation Details (indicate cue type and reason): unaware of bowel movement, fecal matter all over blankets and sheets upon OT arrival     Functional mobility during ADLs: Moderate assistance;Cueing for sequencing;Cueing for safety General ADL Comments: Patient presenting with flat affect and increased time needed to answer all questions. Patient intially agreeable to OOB activity, however then unware of bowel movement requiring total care to complete peri-care at bed level. Patient with productive cough throughout, refusing suction despite encouragement from OT. Given current level of function, patient would benefit from therapy due to decreased activity tolerance. Per PT patient is refusing therapy services. OT deferring to case mangement for placement.     Vision Baseline Vision/History: 0 No visual deficits Ability to See in Adequate Light: 0 Adequate Patient  Visual Report: No change from baseline Vision Assessment?: No apparent visual deficits     Perception Perception: Not tested       Praxis Praxis: Not tested        Pertinent Vitals/Pain Pain Assessment Pain Assessment: Faces Faces Pain Scale: Hurts a little bit Pain Location: generalized (does not specify) Pain Descriptors / Indicators: Discomfort, Grimacing Pain Intervention(s): Limited activity within patient's tolerance, Monitored during session, Repositioned     Extremity/Trunk Assessment Upper Extremity Assessment Upper Extremity Assessment: Generalized weakness   Lower Extremity Assessment Lower Extremity Assessment: Defer to PT evaluation   Cervical / Trunk Assessment Cervical / Trunk Assessment: Normal   Communication Communication Communication: Difficulty following commands/understanding;Difficulty communicating thoughts/reduced clarity of speech Following commands: Follows one step commands with increased time Cueing Techniques: Verbal cues   Cognition Arousal: Alert Behavior During Therapy: Flat affect Overall Cognitive Status: No family/caregiver present to determine baseline cognitive functioning                                 General Comments: Patient requiring increased time to answer all questions, positing potential cognitive deficit at baseline.     General Comments       Exercises     Shoulder Instructions      Home Living Family/patient expects to be discharged to:: Private residence Living Arrangements: Parent Available Help at Discharge: Family;Available 24 hours/day Type of Home: House Home Access: Stairs to enter [employer] of Steps: 1   Home Layout: One level     Bathroom Shower/Tub: Producer, Television/film/video: Handicapped height     Home Equipment: None          Prior Functioning/Environment Prior Level of Function : Independent/Modified Independent             Mobility Comments: independent without DME, primarily sedentary ADLs Comments: reports indep with ADLs; per chart, mother performs iADLs. pt not forthcoming with details        OT  Problem List: Decreased strength;Decreased range of motion;Decreased activity tolerance;Impaired balance (sitting and/or standing);Decreased coordination;Decreased cognition;Decreased safety awareness;Decreased knowledge of use of DME or AE;Cardiopulmonary status limiting activity      OT Treatment/Interventions: Self-care/ADL training;Therapeutic exercise;Energy conservation;DME and/or AE instruction;Manual therapy;Therapeutic activities;Balance training;Patient/family education    OT Goals(Current goals can be found in the care plan section) Acute Rehab OT Goals Patient Stated Goal: [employer] OT Goal Formulation: Patient unable to participate in goal setting Time For Goal Achievement: 12/05/23 Potential to Achieve Goals: Fair  OT Frequency: Min 1X/week    Co-evaluation              AM-PAC OT 6 Clicks Daily Activity     Outcome Measure Help from another person eating meals?: Total (NPO) Help from another person taking care of personal grooming?: A Little Help from another person toileting, which includes using toliet, bedpan, or urinal?: A Lot Help from another person bathing (including washing, rinsing, drying)?: A Lot Help from another person to put on and taking off regular upper body clothing?: A Little Help from another person to put on and taking off regular lower body clothing?: Total 6 Click Score: 12   End of Session Nurse Communication: Mobility status  Activity Tolerance: Patient limited by fatigue;Patient limited by lethargy Patient left: in bed;with call bell/phone within reach;with bed alarm set  OT Visit Diagnosis: Unsteadiness on feet (R26.81);Other abnormalities of gait and mobility (R26.89);Muscle weakness (generalized) (M62.81);Other  symptoms and signs involving cognitive function;Adult, failure to thrive (R62.7)                Time: 1130-1147 OT Time Calculation (min): 17 min Charges:  OT General Charges $OT Visit: 1 Visit OT Evaluation $OT Eval Moderate  Complexity: 1 Mod  Ronal Gift E. Courteny Egler, OTR/L Acute Rehabilitation Services [phone]   Ronal Gift Salt 11/21/2023, 3:39 PM

## 2023-11-21 NOTE — Progress Notes (Signed)
   Called by radiologist. Pt has gas bubbles in the soft tissue of his neck. Pt made npo. Stat ct soft tissue neck with IV contrast ordered.  Carollee Herter, DO [facility]

## 2023-11-21 NOTE — Plan of Care (Signed)
  Problem: Education: Goal: Ability to describe self-care measures that may prevent or decrease complications (Diabetes Survival Skills Education) will improve Outcome: Progressing Goal: Individualized Educational Video(s) Outcome: Progressing   Problem: Coping: Goal: Ability to adjust to condition or change in health will improve Outcome: Progressing   Problem: Fluid Volume: Goal: Ability to maintain a balanced intake and output will improve Outcome: Progressing   Problem: Health Behavior/Discharge Planning: Goal: Ability to identify and utilize available resources and services will improve Outcome: Progressing Goal: Ability to manage health-related needs will improve Outcome: Progressing   Problem: Metabolic: Goal: Ability to maintain appropriate glucose levels will improve Outcome: Progressing   Problem: Nutritional: Goal: Maintenance of adequate nutrition will improve Outcome: Progressing Goal: Progress toward achieving an optimal weight will improve Outcome: Progressing   Problem: Skin Integrity: Goal: Risk for impaired skin integrity will decrease Outcome: Progressing   Problem: Tissue Perfusion: Goal: Adequacy of tissue perfusion will improve Outcome: Progressing   Problem: Fluid Volume: Goal: Hemodynamic stability will improve Outcome: Progressing   Problem: Clinical Measurements: Goal: Diagnostic test results will improve Outcome: Progressing Goal: Signs and symptoms of infection will decrease Outcome: Progressing   Problem: Respiratory: Goal: Ability to maintain adequate ventilation will improve Outcome: Progressing   Problem: Education: Goal: Knowledge of disease or condition will improve Outcome: Progressing Goal: Knowledge of the prescribed therapeutic regimen will improve Outcome: Progressing Goal: Individualized Educational Video(s) Outcome: Progressing   Problem: Activity: Goal: Ability to tolerate increased activity will improve Outcome:  Progressing Goal: Will verbalize the importance of balancing activity with adequate rest periods Outcome: Progressing   Problem: Respiratory: Goal: Ability to maintain a clear airway will improve Outcome: Progressing Goal: Levels of oxygenation will improve Outcome: Progressing Goal: Ability to maintain adequate ventilation will improve Outcome: Progressing   Problem: Education: Goal: Knowledge of General Education information will improve Description: Including pain rating scale, medication(s)/side effects and non-pharmacologic comfort measures Outcome: Progressing   Problem: Health Behavior/Discharge Planning: Goal: Ability to manage health-related needs will improve Outcome: Progressing   Problem: Clinical Measurements: Goal: Ability to maintain clinical measurements within normal limits will improve Outcome: Progressing Goal: Will remain free from infection Outcome: Progressing Goal: Diagnostic test results will improve Outcome: Progressing Goal: Respiratory complications will improve Outcome: Progressing Goal: Cardiovascular complication will be avoided Outcome: Progressing   Problem: Activity: Goal: Risk for activity intolerance will decrease Outcome: Progressing   Problem: Nutrition: Goal: Adequate nutrition will be maintained Outcome: Progressing   Problem: Coping: Goal: Level of anxiety will decrease Outcome: Progressing   Problem: Elimination: Goal: Will not experience complications related to bowel motility Outcome: Progressing Goal: Will not experience complications related to urinary retention Outcome: Progressing   Problem: Pain Management: Goal: General experience of comfort will improve Outcome: Progressing   Problem: Safety: Goal: Ability to remain free from injury will improve Outcome: Progressing   Problem: Skin Integrity: Goal: Risk for impaired skin integrity will decrease Outcome: Progressing

## 2023-11-21 NOTE — Progress Notes (Signed)
   Have consulted PCCM in case pt's airway is compromised this evening.  Carollee Herter, DO [facility]

## 2023-11-21 NOTE — Progress Notes (Addendum)
   Went to bedside, gave pt's mother update.  Pt with large right submandibular mass from odontogenic source. Firm but not tender. No crepitus.  Awaiting to hear back from CMO on transfer options.  Carollee Herter, DO [facility]

## 2023-11-21 NOTE — Progress Notes (Signed)
 Initial Nutrition Assessment  DOCUMENTATION CODES:  Severe malnutrition in context of chronic illness  INTERVENTION:  Liberalize diet to improve intake QHS snack to increase intake Ensure Enlive BID  Magic Cup TID  NUTRITION DIAGNOSIS:  Severe Malnutrition related to chronic illness as evidenced by severe muscle depletion, severe fat depletion.  GOAL:  Patient will meet greater than or equal to 90% of their needs  MONITOR:  PO intake, Supplement acceptance, Weight trends  REASON FOR ASSESSMENT:  Consult Assessment of nutrition requirement/status  ASSESSMENT:   Pt admitted w/ respiratory distress. Endorsing worsening lethargy and tachypnea earlier in the day. Found to have PNA. PMH: COPD, HTN, HLD, T2DM, alcohol  and tobacco abuse, chronic pancreatitis  01/07 - Admitted w/ PNA 01/08 - CT scan showing submandibular abscess  Noted hx of hyponatremia showing improvement w/ fluid restriction. Is noted with elevated INR. Vitamin K ordered SQ w/ redraw pending in the AM. Monitoring for nutrition deficiency. States he cannot report when his last alcoholic beverage was, but is still currently a smoker.   He is severely cachectic looking and endorses poor appetite at bedside. Cannot state how long continuously stating, I don't know. Did request a meal at time of visit and alerted him that lunch would be delivered imminently. Patient now NPO for stat CT d/t presence of gas bubbles in the soft tissue of his neck. Now found to have submandibular abscess from odontogenic source. Will need decayed tooth pulled in order to address issue. Patient endorsed no difficulty chewing or swallowing during bedside visit.   24-Hour Recall: B: bacon, eggs, OJ/milk L: Sandwich, milk D: protein, potato, vegetable   Weights widely variable. Pt also cannot state his UBW, again continuously stating, I don't know. Will re-assess upon next wt collection. No edema noted.   Admit Weight: 59kg Current  Weight: 52.4kg  He lives with his mother at baseline. She does most of the cooking. Will recommend assistance with meal ordering in effort to increase palatable items for patient to improve nutrition status.  Meds: IV ABX, folic acid , SSI, prednisone , Florastor, thiamine   Labs: CBGs 107-158mg /dL J8r 3.1% PHOS 4.6<--3.2 Mg 1.4<--1.5<--1.7 K+ 4.5<--5.3<--5.8 Lactic Acid 2.6<--3.2<--3.9  NUTRITION - FOCUSED PHYSICAL EXAM:  Flowsheet Row Most Recent Value  Orbital Region Moderate depletion  Upper Arm Region Severe depletion  Thoracic and Lumbar Region Severe depletion  Buccal Region Severe depletion  Temple Region Severe depletion  Clavicle Bone Region Severe depletion  Clavicle and Acromion Bone Region Severe depletion  Scapular Bone Region Severe depletion  Dorsal Hand Severe depletion  Patellar Region Severe depletion  Anterior Thigh Region Severe depletion  Posterior Calf Region Severe depletion  Edema (RD Assessment) None  Hair Reviewed  Eyes Reviewed  Mouth Reviewed  Skin Reviewed  Nails Reviewed   Diet Order:   Diet Order             Diet NPO time specified  Diet effective now             EDUCATION NEEDS:  Not appropriate for education at this time  Skin:  Skin Assessment: Reviewed RN Assessment  Last BM:  01/07  Height:  Ht Readings from Last 1 Encounters:  11/20/23 6' (1.829 m)    Weight:  Wt Readings from Last 1 Encounters:  11/20/23 52.4 kg    Ideal Body Weight:  80.9 kg  BMI:  Body mass index is 15.67 kg/m.  Estimated Nutritional Needs:   Kcal:  1900-2100kcal  Protein:  95-105g  Fluid:  1.9-2L/day  Blair Deaner MS, RD, LDN Registered Dietitian Clinical Nutrition RD Inpatient Contact Info in Amion

## 2023-11-22 ENCOUNTER — Inpatient Hospital Stay (HOSPITAL_COMMUNITY): Payer: Medicaid Other | Admitting: Anesthesiology

## 2023-11-22 ENCOUNTER — Inpatient Hospital Stay (HOSPITAL_COMMUNITY): Payer: Medicaid Other

## 2023-11-22 ENCOUNTER — Encounter (HOSPITAL_COMMUNITY): Admission: EM | Disposition: E | Payer: Self-pay | Source: Home / Self Care | Attending: Pulmonary Disease

## 2023-11-22 ENCOUNTER — Other Ambulatory Visit: Payer: Self-pay

## 2023-11-22 ENCOUNTER — Encounter (HOSPITAL_COMMUNITY): Payer: Self-pay | Admitting: Internal Medicine

## 2023-11-22 DIAGNOSIS — R0603 Acute respiratory distress: Secondary | ICD-10-CM

## 2023-11-22 DIAGNOSIS — J189 Pneumonia, unspecified organism: Secondary | ICD-10-CM | POA: Diagnosis not present

## 2023-11-22 DIAGNOSIS — I10 Essential (primary) hypertension: Secondary | ICD-10-CM | POA: Diagnosis not present

## 2023-11-22 DIAGNOSIS — L0291 Cutaneous abscess, unspecified: Secondary | ICD-10-CM

## 2023-11-22 DIAGNOSIS — J449 Chronic obstructive pulmonary disease, unspecified: Secondary | ICD-10-CM | POA: Diagnosis not present

## 2023-11-22 DIAGNOSIS — F17200 Nicotine dependence, unspecified, uncomplicated: Secondary | ICD-10-CM

## 2023-11-22 DIAGNOSIS — L0211 Cutaneous abscess of neck: Secondary | ICD-10-CM | POA: Diagnosis not present

## 2023-11-22 HISTORY — PX: IRRIGATION AND DEBRIDEMENT ABSCESS: SHX5252

## 2023-11-22 LAB — POCT I-STAT 7, (LYTES, BLD GAS, ICA,H+H)
Acid-Base Excess: 22 mmol/L — ABNORMAL HIGH (ref 0.0–2.0)
Acid-Base Excess: 20 mmol/L — ABNORMAL HIGH (ref 0.0–2.0)
Bicarbonate: 46 mmol/L — ABNORMAL HIGH (ref 20.0–28.0)
Bicarbonate: 45.6 mmol/L — ABNORMAL HIGH (ref 20.0–28.0)
Calcium, Ion: 1.03 mmol/L — ABNORMAL LOW (ref 1.15–1.40)
Calcium, Ion: 1.03 mmol/L — ABNORMAL LOW (ref 1.15–1.40)
HCT: 35 % — ABNORMAL LOW (ref 39.0–52.0)
HCT: 34 % — ABNORMAL LOW (ref 39.0–52.0)
Hemoglobin: 11.9 g/dL — ABNORMAL LOW (ref 13.0–17.0)
Hemoglobin: 11.6 g/dL — ABNORMAL LOW (ref 13.0–17.0)
O2 Saturation: 96 %
O2 Saturation: 100 %
Patient temperature: 92.1
Patient temperature: 34.6
Potassium: 3.5 mmol/L (ref 3.5–5.1)
Potassium: 3.6 mmol/L (ref 3.5–5.1)
Sodium: 145 mmol/L (ref 135–145)
Sodium: 146 mmol/L — ABNORMAL HIGH (ref 135–145)
TCO2: 47 mmol/L — ABNORMAL HIGH (ref 22–32)
TCO2: 47 mmol/L — ABNORMAL HIGH (ref 22–32)
pCO2 arterial: 37.7 mm[Hg] (ref 32–48)
pCO2 arterial: 49.3 mm[Hg] — ABNORMAL HIGH (ref 32–48)
pH, Arterial: 7.685 (ref 7.35–7.45)
pH, Arterial: 7.566 — ABNORMAL HIGH (ref 7.35–7.45)
pO2, Arterial: 52 mm[Hg] — ABNORMAL LOW (ref 83–108)
pO2, Arterial: 160 mm[Hg] — ABNORMAL HIGH (ref 83–108)

## 2023-11-22 LAB — BLOOD GAS, ARTERIAL
Acid-Base Excess: 18.1 mmol/L — ABNORMAL HIGH (ref 0.0–2.0)
Bicarbonate: 48 mmol/L — ABNORMAL HIGH (ref 20.0–28.0)
O2 Saturation: 98.6 %
Patient temperature: 36.4
pCO2 arterial: 81 mm[Hg] (ref 32–48)
pH, Arterial: 7.38 (ref 7.35–7.45)
pO2, Arterial: 98 mm[Hg] (ref 83–108)

## 2023-11-22 LAB — GLUCOSE, CAPILLARY
Glucose-Capillary: 130 mg/dL — ABNORMAL HIGH (ref 70–99)
Glucose-Capillary: 50 mg/dL — ABNORMAL LOW (ref 70–99)
Glucose-Capillary: 94 mg/dL (ref 70–99)
Glucose-Capillary: 113 mg/dL — ABNORMAL HIGH (ref 70–99)
Glucose-Capillary: 122 mg/dL — ABNORMAL HIGH (ref 70–99)
Glucose-Capillary: 107 mg/dL — ABNORMAL HIGH (ref 70–99)
Glucose-Capillary: 140 mg/dL — ABNORMAL HIGH (ref 70–99)
Glucose-Capillary: 148 mg/dL — ABNORMAL HIGH (ref 70–99)

## 2023-11-22 LAB — CBC WITH DIFFERENTIAL/PLATELET
Abs Immature Granulocytes: 0.15 10*3/uL — ABNORMAL HIGH (ref 0.00–0.07)
Basophils Absolute: 0.1 10*3/uL (ref 0.0–0.1)
Basophils Relative: 0 %
Eosinophils Absolute: 0.1 10*3/uL (ref 0.0–0.5)
Eosinophils Relative: 0 %
HCT: 38.4 % — ABNORMAL LOW (ref 39.0–52.0)
Hemoglobin: 12.3 g/dL — ABNORMAL LOW (ref 13.0–17.0)
Immature Granulocytes: 1 %
Lymphocytes Relative: 2 %
Lymphs Abs: 0.3 10*3/uL — ABNORMAL LOW (ref 0.7–4.0)
MCH: 29.3 pg (ref 26.0–34.0)
MCHC: 32 g/dL (ref 30.0–36.0)
MCV: 91.4 fL (ref 80.0–100.0)
Monocytes Absolute: 0.8 10*3/uL (ref 0.1–1.0)
Monocytes Relative: 5 %
Neutro Abs: 16.8 10*3/uL — ABNORMAL HIGH (ref 1.7–7.7)
Neutrophils Relative %: 92 %
Platelets: 75 10*3/uL — ABNORMAL LOW (ref 150–400)
RBC: 4.2 MIL/uL — ABNORMAL LOW (ref 4.22–5.81)
RDW: 15.5 % (ref 11.5–15.5)
WBC: 18.2 10*3/uL — ABNORMAL HIGH (ref 4.0–10.5)
nRBC: 0.1 % (ref 0.0–0.2)

## 2023-11-22 LAB — COMPREHENSIVE METABOLIC PANEL
ALT: 108 U/L — ABNORMAL HIGH (ref 0–44)
AST: 63 U/L — ABNORMAL HIGH (ref 15–41)
Albumin: 2.5 g/dL — ABNORMAL LOW (ref 3.5–5.0)
Alkaline Phosphatase: 110 U/L (ref 38–126)
Anion gap: 9 (ref 5–15)
BUN: 37 mg/dL — ABNORMAL HIGH (ref 8–23)
CO2: 44 mmol/L — ABNORMAL HIGH (ref 22–32)
Calcium: 7.9 mg/dL — ABNORMAL LOW (ref 8.9–10.3)
Chloride: 93 mmol/L — ABNORMAL LOW (ref 98–111)
Creatinine, Ser: 0.31 mg/dL — ABNORMAL LOW (ref 0.61–1.24)
GFR, Estimated: >60 mL/min (ref >60)
Glucose, Bld: 50 mg/dL — ABNORMAL LOW (ref 70–99)
Potassium: 4.1 mmol/L (ref 3.5–5.1)
Sodium: 146 mmol/L — ABNORMAL HIGH (ref 135–145)
Total Bilirubin: 1.1 mg/dL (ref 0.0–1.2)
Total Protein: 5.4 g/dL — ABNORMAL LOW (ref 6.5–8.1)

## 2023-11-22 LAB — MAGNESIUM
Magnesium: 1.8 mg/dL (ref 1.7–2.4)
Magnesium: 1.3 mg/dL — ABNORMAL LOW (ref 1.7–2.4)

## 2023-11-22 LAB — VITAMIN B1: Vitamin B1 (Thiamine): 125.9 nmol/L (ref 66.5–200.0)

## 2023-11-22 LAB — PHOSPHORUS: Phosphorus: 2.3 mg/dL — ABNORMAL LOW (ref 2.5–4.6)

## 2023-11-22 SURGERY — IRRIGATION AND DEBRIDEMENT ABSCESS
Anesthesia: General

## 2023-11-22 MED ORDER — ONDANSETRON HCL 4 MG/2ML IJ SOLN: 4.0000 mg | Freq: Once | INTRAMUSCULAR | Status: DC | PRN

## 2023-11-22 MED ORDER — SUCCINYLCHOLINE CHLORIDE 200 MG/10ML IV SOSY
PREFILLED_SYRINGE | INTRAVENOUS | Status: DC | PRN
  Administered 2023-11-22: 40 mg via INTRAVENOUS

## 2023-11-22 MED ORDER — CHLORHEXIDINE GLUCONATE 0.12 % MT SOLN
OROMUCOSAL | Status: AC
  Administered 2023-11-22: 15 mL via OROMUCOSAL
  Filled 2023-11-22: qty 15

## 2023-11-22 MED ORDER — LIDOCAINE 2% (20 MG/ML) 5 ML SYRINGE
INTRAMUSCULAR | Status: DC | PRN
  Administered 2023-11-22: 60 mg via INTRAVENOUS

## 2023-11-22 MED ORDER — 0.9 % SODIUM CHLORIDE (POUR BTL) OPTIME
TOPICAL | Status: DC | PRN
  Administered 2023-11-22: 1000 mL

## 2023-11-22 MED ORDER — DEXAMETHASONE SODIUM PHOSPHATE 10 MG/ML IJ SOLN: INTRAMUSCULAR | Status: DC | PRN

## 2023-11-22 MED ORDER — OXYCODONE HCL 5 MG/5ML PO SOLN: 5.0000 mg | Freq: Once | ORAL | Status: DC | PRN

## 2023-11-22 MED ORDER — THIAMINE HCL 100 MG/ML IJ SOLN
100.0000 mg | INTRAMUSCULAR | Status: DC
  Filled 2023-11-22: qty 2

## 2023-11-22 MED ORDER — ACETAMINOPHEN 500 MG PO TABS: 1000.0000 mg | ORAL_TABLET | Freq: Once | ORAL | Status: DC

## 2023-11-22 MED ORDER — EPHEDRINE SULFATE-NACL 50-0.9 MG/10ML-% IV SOSY
PREFILLED_SYRINGE | INTRAVENOUS | Status: DC | PRN
  Administered 2023-11-22: 10 mg via INTRAVENOUS

## 2023-11-22 MED ORDER — FENTANYL CITRATE PF 50 MCG/ML IJ SOSY: 50.0000 ug | PREFILLED_SYRINGE | Freq: Once | INTRAMUSCULAR | Status: DC

## 2023-11-22 MED ORDER — NOREPINEPHRINE 4 MG/250ML-% IV SOLN
15.0000 ug/min | INTRAVENOUS | Status: DC
  Administered 2023-11-22: 2 ug/min via INTRAVENOUS
  Administered 2023-11-24: 5 ug/min via INTRAVENOUS
  Administered 2023-11-24: 4 ug/min via INTRAVENOUS
  Administered 2023-11-25: 10 ug/min via INTRAVENOUS
  Administered 2023-11-25: 7 ug/min via INTRAVENOUS
  Filled 2023-11-22 (×4): qty 250

## 2023-11-22 MED ORDER — LACTULOSE 10 GM/15ML PO SOLN
20.0000 g | Freq: Two times a day (BID) | ORAL | Status: DC
  Administered 2023-11-22: 20 g via ORAL
  Filled 2023-11-22: qty 30

## 2023-11-22 MED ORDER — AMISULPRIDE (ANTIEMETIC) 5 MG/2ML IV SOLN: 10.0000 mg | Freq: Once | INTRAVENOUS | Status: DC | PRN

## 2023-11-22 MED ORDER — PANTOPRAZOLE SODIUM 40 MG IV SOLR
40.0000 mg | Freq: Every day | INTRAVENOUS | Status: DC
  Administered 2023-11-22 – 2023-11-26 (×5): 40 mg via INTRAVENOUS
  Filled 2023-11-22 (×5): qty 10

## 2023-11-22 MED ORDER — SODIUM CHLORIDE 0.9 % IV SOLN
250.0000 mL | INTRAVENOUS | Status: AC
  Administered 2023-11-22: 500 mL via INTRAVENOUS

## 2023-11-22 MED ORDER — PROSOURCE TF20 ENFIT COMPATIBL EN LIQD
60.0000 mL | Freq: Every day | ENTERAL | Status: DC
  Administered 2023-11-23 – 2023-11-26 (×4): 60 mL
  Filled 2023-11-22 (×4): qty 60

## 2023-11-22 MED ORDER — OXYCODONE HCL 5 MG PO TABS: 5.0000 mg | ORAL_TABLET | Freq: Once | ORAL | Status: DC | PRN

## 2023-11-22 MED ORDER — FENTANYL BOLUS VIA INFUSION
50.0000 ug | INTRAVENOUS | Status: DC | PRN
  Administered 2023-11-23: 50 ug via INTRAVENOUS
  Administered 2023-11-23: 100 ug via INTRAVENOUS
  Administered 2023-11-23 (×2): 25 ug via INTRAVENOUS

## 2023-11-22 MED ORDER — DOCUSATE SODIUM 50 MG/5ML PO LIQD
100.0000 mg | Freq: Two times a day (BID) | ORAL | Status: DC
  Administered 2023-11-22 – 2023-11-25 (×3): 100 mg
  Filled 2023-11-22 (×4): qty 10

## 2023-11-22 MED ORDER — ARFORMOTEROL TARTRATE 15 MCG/2ML IN NEBU
15.0000 ug | INHALATION_SOLUTION | Freq: Two times a day (BID) | RESPIRATORY_TRACT | Status: DC
  Administered 2023-11-22 – 2023-11-27 (×11): 15 ug via RESPIRATORY_TRACT
  Filled 2023-11-22 (×11): qty 2

## 2023-11-22 MED ORDER — PROPOFOL 10 MG/ML IV BOLUS
INTRAVENOUS | Status: AC
  Filled 2023-11-22: qty 20

## 2023-11-22 MED ORDER — SODIUM CHLORIDE 0.9 % IV SOLN
3.0000 g | Freq: Four times a day (QID) | INTRAVENOUS | Status: DC
  Administered 2023-11-22 – 2023-11-26 (×17): 3 g via INTRAVENOUS
  Filled 2023-11-22 (×17): qty 8

## 2023-11-22 MED ORDER — LACTULOSE 10 GM/15ML PO SOLN
20.0000 g | Freq: Two times a day (BID) | ORAL | Status: DC
  Administered 2023-11-23 – 2023-11-26 (×6): 20 g
  Filled 2023-11-22 (×7): qty 30

## 2023-11-22 MED ORDER — REVEFENACIN 175 MCG/3ML IN SOLN
175.0000 ug | Freq: Every day | RESPIRATORY_TRACT | Status: DC
  Administered 2023-11-22 – 2023-11-27 (×6): 175 ug via RESPIRATORY_TRACT
  Filled 2023-11-22 (×6): qty 3

## 2023-11-22 MED ORDER — ONDANSETRON HCL 4 MG/2ML IJ SOLN
INTRAMUSCULAR | Status: AC
  Filled 2023-11-22: qty 2

## 2023-11-22 MED ORDER — LIDOCAINE 2% (20 MG/ML) 5 ML SYRINGE
INTRAMUSCULAR | Status: AC
  Filled 2023-11-22: qty 5

## 2023-11-22 MED ORDER — HYDROMORPHONE HCL 1 MG/ML IJ SOLN
0.2500 mg | INTRAMUSCULAR | Status: DC | PRN
Start: 2023-11-22 — End: 2023-11-22

## 2023-11-22 MED ORDER — ORAL CARE MOUTH RINSE: 15.0000 mL | Freq: Once | OROMUCOSAL | Status: AC

## 2023-11-22 MED ORDER — POLYETHYLENE GLYCOL 3350 17 G PO PACK
17.0000 g | PACK | Freq: Every day | ORAL | Status: DC
  Administered 2023-11-23: 17 g
  Filled 2023-11-22 (×2): qty 1

## 2023-11-22 MED ORDER — CLINDAMYCIN PHOSPHATE 900 MG/50ML IV SOLN
900.0000 mg | Freq: Three times a day (TID) | INTRAVENOUS | Status: DC
  Administered 2023-11-22 – 2023-11-23 (×4): 900 mg via INTRAVENOUS
  Filled 2023-11-22 (×7): qty 50

## 2023-11-22 MED ORDER — MIDAZOLAM HCL 2 MG/2ML IJ SOLN
INTRAMUSCULAR | Status: AC
  Filled 2023-11-22: qty 2

## 2023-11-22 MED ORDER — PROPOFOL 1000 MG/100ML IV EMUL
0.0000 ug/kg/min | INTRAVENOUS | Status: DC
  Administered 2023-11-22: 30 ug/kg/min via INTRAVENOUS
  Filled 2023-11-22: qty 100

## 2023-11-22 MED ORDER — PIVOT 1.5 CAL PO LIQD
1000.0000 mL | ORAL | Status: DC
  Administered 2023-11-22: 1000 mL
  Filled 2023-11-22 (×2): qty 1000

## 2023-11-22 MED ORDER — NOREPINEPHRINE 4 MG/250ML-% IV SOLN
INTRAVENOUS | Status: AC
  Filled 2023-11-22: qty 250

## 2023-11-22 MED ORDER — SODIUM CHLORIDE 0.9 % IV SOLN: INTRAVENOUS | Status: AC | PRN

## 2023-11-22 MED ORDER — FENTANYL 2500MCG IN NS 250ML (10MCG/ML) PREMIX INFUSION
50.0000 ug/h | INTRAVENOUS | Status: DC
Start: 2023-11-22 — End: 2023-11-27
  Administered 2023-11-22: 50 ug/h via INTRAVENOUS
  Administered 2023-11-24 (×2): 100 ug/h via INTRAVENOUS
  Administered 2023-11-26: 50 ug/h via INTRAVENOUS
  Administered 2023-11-27: 75 ug/h via INTRAVENOUS
  Filled 2023-11-22 (×5): qty 250

## 2023-11-22 MED ORDER — DEXTROSE-SODIUM CHLORIDE 5-0.45 % IV SOLN: INTRAVENOUS | Status: AC

## 2023-11-22 MED ORDER — CHLORHEXIDINE GLUCONATE CLOTH 2 % EX PADS
6.0000 | MEDICATED_PAD | Freq: Every day | CUTANEOUS | Status: DC
  Administered 2023-11-22 – 2023-11-27 (×6): 6 via TOPICAL

## 2023-11-22 MED ORDER — PHENYLEPHRINE HCL-NACL 20-0.9 MG/250ML-% IV SOLN
INTRAVENOUS | Status: DC | PRN
  Administered 2023-11-22 (×4): 80 ug via INTRAVENOUS

## 2023-11-22 MED ORDER — LACTATED RINGERS IV SOLN: INTRAVENOUS | Status: DC

## 2023-11-22 MED ORDER — EPHEDRINE 5 MG/ML INJ
INTRAVENOUS | Status: AC
  Filled 2023-11-22: qty 5

## 2023-11-22 MED ORDER — ROCURONIUM BROMIDE 100 MG/10ML IV SOLN
INTRAVENOUS | Status: DC | PRN
  Administered 2023-11-22: 50 mg via INTRAVENOUS

## 2023-11-22 MED ORDER — ORAL CARE MOUTH RINSE
15.0000 mL | OROMUCOSAL | Status: DC
  Administered 2023-11-23 – 2023-11-27 (×49): 15 mL via OROMUCOSAL

## 2023-11-22 MED ORDER — ORAL CARE MOUTH RINSE: 15.0000 mL | OROMUCOSAL | Status: DC | PRN

## 2023-11-22 MED ORDER — NOREPINEPHRINE 4 MG/250ML-% IV SOLN
0.0000 ug/min | INTRAVENOUS | Status: DC
  Administered 2023-11-22: 2 ug/min via INTRAVENOUS

## 2023-11-22 MED ORDER — CHLORHEXIDINE GLUCONATE 0.12 % MT SOLN: 15.0000 mL | Freq: Once | OROMUCOSAL | Status: AC

## 2023-11-22 MED ORDER — ALBUMIN HUMAN 5 % IV SOLN
INTRAVENOUS | Status: DC | PRN
Start: 2023-11-22 — End: 2023-11-22

## 2023-11-22 MED ORDER — THIAMINE HCL 100 MG/ML IJ SOLN
500.0000 mg | Freq: Three times a day (TID) | INTRAVENOUS | Status: AC
  Administered 2023-11-22 – 2023-11-24 (×5): 500 mg via INTRAVENOUS
  Filled 2023-11-22 (×8): qty 5

## 2023-11-22 MED ORDER — DEXTROSE 50 % IV SOLN
25.0000 g | INTRAVENOUS | Status: AC
  Administered 2023-11-22: 25 g via INTRAVENOUS
  Filled 2023-11-22: qty 50

## 2023-11-22 MED ORDER — PROPOFOL 10 MG/ML IV BOLUS
INTRAVENOUS | Status: DC | PRN
  Administered 2023-11-22: 50 mg via INTRAVENOUS

## 2023-11-22 SURGICAL SUPPLY — 44 items
ATTRACTOMAT 16X20 MAGNETIC DRP (DRAPES) IMPLANT
BAG COUNTER SPONGE SURGICOUNT (BAG) ×1 IMPLANT
BLADE SURG 10 STRL SS (BLADE) ×1 IMPLANT
BLADE SURG 15 STRL LF DISP TIS (BLADE) IMPLANT
CANISTER SUCT 3000ML PPV (MISCELLANEOUS) ×1 IMPLANT
CLEANER TIP ELECTROSURG 2X2 (MISCELLANEOUS) ×1 IMPLANT
CNTNR URN SCR LID CUP LEK RST (MISCELLANEOUS) ×1 IMPLANT
COVER SURGICAL LIGHT HANDLE (MISCELLANEOUS) IMPLANT
DRAIN CHANNEL 15F RND FF W/TCR (WOUND CARE) IMPLANT
DRAIN PENROSE .5X12 LATEX STL (DRAIN) IMPLANT
DRAPE HALF SHEET 40X57 (DRAPES) IMPLANT
DRAPE INCISE 13X13 STRL (DRAPES) IMPLANT
ELECT COATED BLADE 2.86 ST (ELECTRODE) ×1 IMPLANT
ELECT REM PT RETURN 9FT ADLT (ELECTROSURGICAL) ×1
ELECTRODE REM PT RTRN 9FT ADLT (ELECTROSURGICAL) ×1 IMPLANT
EVACUATOR SILICONE 100CC (DRAIN) IMPLANT
GAUZE 4X4 16PLY ~~LOC~~+RFID DBL (SPONGE) ×1 IMPLANT
GAUZE SPONGE 4X4 12PLY STRL (GAUZE/BANDAGES/DRESSINGS) ×1 IMPLANT
GLOVE SS BIOGEL STRL SZ 7.5 (GLOVE) ×1 IMPLANT
GOWN STRL REUS W/ TWL LRG LVL3 (GOWN DISPOSABLE) ×2 IMPLANT
KIT BASIN OR (CUSTOM PROCEDURE TRAY) ×1 IMPLANT
KIT TURNOVER KIT B (KITS) ×1 IMPLANT
NDL HYPO 25GX1X1/2 BEV (NEEDLE) IMPLANT
NEEDLE HYPO 25GX1X1/2 BEV (NEEDLE) IMPLANT
NS IRRIG 1000ML POUR BTL (IV SOLUTION) ×1 IMPLANT
PAD ARMBOARD 7.5X6 YLW CONV (MISCELLANEOUS) ×2 IMPLANT
PENCIL FOOT CONTROL (ELECTRODE) ×1 IMPLANT
POSITIONER HEAD DONUT 9IN (MISCELLANEOUS) IMPLANT
SOL PREP POV-IOD 4OZ 10% (MISCELLANEOUS) IMPLANT
SPONGE INTESTINAL PEANUT (DISPOSABLE) IMPLANT
STAPLER VISISTAT 35W (STAPLE) ×1 IMPLANT
SUCTION TUBE FRAZIER 8FR DISP (SUCTIONS) IMPLANT
SUT ETHILON 3 0 PS 1 (SUTURE) IMPLANT
SUT MNCRL AB 4-0 PS2 18 (SUTURE) IMPLANT
SUT NYLON ETHILON 5-0 P-3 1X18 (SUTURE) IMPLANT
SUT SILK 2 0 PERMA HAND 18 BK (SUTURE) IMPLANT
SWAB CULTURE ESWAB REG 1ML (MISCELLANEOUS) IMPLANT
SWAB CULTURE LIQ STUART DBL (MISCELLANEOUS) IMPLANT
TAPE CLOTH 4X10 WHT NS (GAUZE/BANDAGES/DRESSINGS) IMPLANT
TAPE CLOTH SURG 4X10 WHT LF (GAUZE/BANDAGES/DRESSINGS) IMPLANT
TOWEL GREEN STERILE FF (TOWEL DISPOSABLE) ×1 IMPLANT
TRAY ENT MC OR (CUSTOM PROCEDURE TRAY) ×1 IMPLANT
WATER STERILE IRR 1000ML POUR (IV SOLUTION) ×1 IMPLANT
YANKAUER SUCT BULB TIP NO VENT (SUCTIONS) IMPLANT

## 2023-11-22 NOTE — Op Note (Signed)
 Preop/postop diagnosis: Right neck abscess Procedure: Incision and drainage of right neck abscess Anesthesia: General Estimated blood loss: Less than 10 cc Indications: 63 year old with a dental abscess that is extending down into his submental/mandibular area.  The patient is not in such a state that he can consent for the procedure.  He has had multiple family members called and no one is answering to gain consent.  He has a gas containing abscess that is dental related and this needs to be drained.  We cannot find any oral surgery coverage to take care of the problem and the fashion in which the tooth needs to be extracted at the same time.  The procedure and issue discussed with Gordy Pina and declared an emergency Patient was taken the op room placed supine position after general endotracheal tube anesthesia was prepped and draped in the usual sterile manner.  An incision was made in the inferior aspect of the submandibular area with a 15 blade just through the skin.  Immediately pus was identified and cultured.  The wound was opened with a hemostat.  Once opened a finger dissection was used to open up the loculations and drain all of the purulent material.  It was irrigated with saline.  1/4 inch Penrose was placed and secured with a 3-0 nylon.  The patient was awakened brought recovery in stable condition counts correct

## 2023-11-22 NOTE — Progress Notes (Signed)
 Pharmacy Antibiotic Note  [name] is a 63 y.o. male admitted on 11/19/2023 with concern for odontogenic abscess with multiple large dental caries. Pharmacy has been consulted for Unasyn  dosing. SCr 0.31. WBC 18.2 (slow trend down). Afebrile.  ENT consult - needs oral surgery and abscess drain. Transfer to ICU today for airway watch.   Plan: Increase Unasyn  3g every 6 hours MD added Clindamycin  900mg  IV q8h x3 days for toxin inhibition.  F/u renal function, infectious work up and length of therapy  Height: 6' (182.9 cm) Weight: 52.4 kg (115 lb 8.3 oz) IBW/kg (Calculated) : 77.6  Temp (24hrs), Avg:96.9 F (36.1 C), Min:94.1 F (34.5 C), Max:97.6 F (36.4 C)  Recent Labs  Lab 11/19/23 2245 11/20/23 0137 11/20/23 0409 11/20/23 1056 11/21/23 0231 11/21/23 0742 11/22/23 0220  WBC 24.7*  --   --  23.7* 19.4*  --  18.2*  CREATININE 1.14 1.14  --  1.01  --  0.52* 0.31*  LATICACIDVEN 3.9* 3.2* 2.6*  --   --   --   --     Estimated Creatinine Clearance: 69.1 mL/min (A) (by C-G formula based on SCr of 0.31 mg/dL (L)).    No Known Allergies  Antimicrobials this admission: Ceftriaxone  1/6  x 1 Azithromycin  1/6 x 1 Unasyn  1/7 >   Microbiology results: 1/6 BCx: ngtd x1d 1/6 Sputum: sent 1/6 resp panel: neg 1/7 MRSA PCR negative  Thank you for allowing pharmacy to be a part of this patient's care.  Harlene Boga, PharmD, BCPS, BCCCP Please refer to [facility] for [facility] numbers 11/22/2023 8:50 AM

## 2023-11-22 NOTE — Progress Notes (Signed)
 Attempted to call patient's mother to inquire about blood products.  No answer on either phone number provided. Left voicemail with call back number.  [name]. [phone], [phone]   Attempted to call patient's uncle listed in contacts No answer [phone]

## 2023-11-22 NOTE — Progress Notes (Signed)
 Hypoglycemic Event  CBG: 50  Treatment: D50 50 mL (25 gm)  Symptoms: None  Follow-up CBG: Time:0530 CBG Result:130  Possible Reasons for Event: Inadequate meal intake  Comments/MD notified:     Elide Stalzer D Kadeisha Betsch

## 2023-11-22 NOTE — Progress Notes (Signed)
 Patient transported from PACU to room 3M07, on ventilator, without any complications.

## 2023-11-22 NOTE — Progress Notes (Signed)
   11/22/23 0053  Assess: MEWS Score  Pulse Rate (!) 59  ECG Heart Rate (!) 58  Resp 14  Level of Consciousness Alert  SpO2 (!) 88 %  O2 Device HFNC  Patient Activity (if Appropriate) In bed  O2 Flow Rate (L/min) 7 L/min  Assess: MEWS Score  MEWS Temp 2  MEWS Systolic 0  MEWS Pulse 0  MEWS RR 0  MEWS LOC 0  MEWS Score 2  MEWS Score Color Yellow  Assess: if the MEWS score is Yellow or Red  Were vital signs accurate and taken at a resting state? Yes  Does the patient meet 2 or more of the SIRS criteria? No  MEWS guidelines implemented  Yes, yellow  Treat  MEWS Interventions Considered administering scheduled or prn medications/treatments as ordered  Take Vital Signs  Increase Vital Sign Frequency  Yellow: Q2hr x1, continue Q4hrs until patient remains green for 12hrs  Escalate  MEWS: Escalate Yellow: Discuss with charge nurse and consider notifying provider and/or RRT  Notify: Charge Nurse/RN  Name of Charge Nurse/RN Notified Tanya RN  Provider Notification  Provider Name/Title Dr. Shona  Date Provider Notified 11/22/23  Time Provider Notified [phone]  Method of Notification Page  Notification Reason Change in status  Provider response See new orders;En route  Date of Provider Response 11/22/23  Time of Provider Response 0108  Assess: SIRS CRITERIA  SIRS Temperature  1  SIRS Respirations  0  SIRS Pulse 0  SIRS WBC 0  SIRS Score Sum  1   Patient placed on HFNC, STAT chest x-ray ordered per MD

## 2023-11-22 NOTE — Progress Notes (Signed)
 Patient on 3-4L of O2 patient O2 dropping down to 84 % patient bumped up to 5L of O2 with no change. Patient now on HFNC at 7L after, patient slowly recovering to 95%. STAT Chest x-ray ordered per MD, Rapid Response also called and made aware of patients change in status.

## 2023-11-22 NOTE — Plan of Care (Signed)
  Problem: Education: Goal: Ability to describe self-care measures that may prevent or decrease complications (Diabetes Survival Skills Education) will improve Outcome: Not Progressing Goal: Individualized Educational Video(s) Outcome: Not Progressing   Problem: Coping: Goal: Ability to adjust to condition or change in health will improve Outcome: Not Progressing   Problem: Fluid Volume: Goal: Ability to maintain a balanced intake and output will improve Outcome: Not Progressing   Problem: Health Behavior/Discharge Planning: Goal: Ability to identify and utilize available resources and services will improve Outcome: Not Progressing Goal: Ability to manage health-related needs will improve Outcome: Not Progressing   Problem: Metabolic: Goal: Ability to maintain appropriate glucose levels will improve Outcome: Not Progressing   Problem: Nutritional: Goal: Maintenance of adequate nutrition will improve Outcome: Not Progressing Goal: Progress toward achieving an optimal weight will improve Outcome: Not Progressing   Problem: Skin Integrity: Goal: Risk for impaired skin integrity will decrease Outcome: Not Progressing   Problem: Tissue Perfusion: Goal: Adequacy of tissue perfusion will improve Outcome: Not Progressing   Problem: Fluid Volume: Goal: Hemodynamic stability will improve Outcome: Not Progressing   Problem: Clinical Measurements: Goal: Diagnostic test results will improve Outcome: Not Progressing Goal: Signs and symptoms of infection will decrease Outcome: Not Progressing   Problem: Respiratory: Goal: Ability to maintain adequate ventilation will improve Outcome: Not Progressing   Problem: Education: Goal: Knowledge of disease or condition will improve Outcome: Not Progressing Goal: Knowledge of the prescribed therapeutic regimen will improve Outcome: Not Progressing Goal: Individualized Educational Video(s) Outcome: Not Progressing   Problem:  Activity: Goal: Ability to tolerate increased activity will improve Outcome: Not Progressing Goal: Will verbalize the importance of balancing activity with adequate rest periods Outcome: Not Progressing   Problem: Respiratory: Goal: Ability to maintain a clear airway will improve Outcome: Not Progressing Goal: Levels of oxygenation will improve Outcome: Not Progressing Goal: Ability to maintain adequate ventilation will improve Outcome: Not Progressing   Problem: Education: Goal: Knowledge of General Education information will improve Description: Including pain rating scale, medication(s)/side effects and non-pharmacologic comfort measures Outcome: Not Progressing   Problem: Health Behavior/Discharge Planning: Goal: Ability to manage health-related needs will improve Outcome: Not Progressing   Problem: Clinical Measurements: Goal: Ability to maintain clinical measurements within normal limits will improve Outcome: Not Progressing Goal: Will remain free from infection Outcome: Not Progressing Goal: Diagnostic test results will improve Outcome: Not Progressing Goal: Respiratory complications will improve Outcome: Not Progressing Goal: Cardiovascular complication will be avoided Outcome: Not Progressing   Problem: Activity: Goal: Risk for activity intolerance will decrease Outcome: Not Progressing   Problem: Nutrition: Goal: Adequate nutrition will be maintained Outcome: Not Progressing   Problem: Coping: Goal: Level of anxiety will decrease Outcome: Not Progressing   Problem: Elimination: Goal: Will not experience complications related to bowel motility Outcome: Not Progressing Goal: Will not experience complications related to urinary retention Outcome: Not Progressing   Problem: Pain Management: Goal: General experience of comfort will improve Outcome: Not Progressing   Problem: Safety: Goal: Ability to remain free from injury will improve Outcome: Not  Progressing   Problem: Skin Integrity: Goal: Risk for impaired skin integrity will decrease Outcome: Not Progressing

## 2023-11-22 NOTE — Transfer of Care (Signed)
 Immediate Anesthesia Transfer of Care Note  Patient: [name]  Procedure(s) Performed: IRRIGATION AND DEBRIDEMENT NECK  ABSCESS  Patient Location: PACU  Anesthesia Type:General  Level of Consciousness: sedated and unresponsive  Airway & Oxygen  Therapy: Patient placed on Ventilator (see vital sign flow sheet for setting)  Post-op Assessment: Report given to RN and Post -op Vital signs reviewed and stable  Post vital signs: Reviewed and stable  Last Vitals:  Vitals Value Taken Time  BP 142/100 11/22/23 1347  Temp 35.7 C 11/22/23 1335  Pulse 71 11/22/23 1353  Resp 20 11/22/23 1353  SpO2 98 % 11/22/23 1353  Vitals shown include unfiled device data.  Last Pain:  Vitals:   11/22/23 0750  TempSrc: Oral  PainSc:          Complications: No notable events documented.

## 2023-11-22 NOTE — Progress Notes (Signed)
 Nasogastric tube advanced to 66cm per note from radiology

## 2023-11-22 NOTE — Plan of Care (Signed)
  Problem: Education: Goal: Ability to describe self-care measures that may prevent or decrease complications (Diabetes Survival Skills Education) will improve Outcome: Progressing Goal: Individualized Educational Video(s) Outcome: Progressing   Problem: Coping: Goal: Ability to adjust to condition or change in health will improve Outcome: Progressing   Problem: Fluid Volume: Goal: Ability to maintain a balanced intake and output will improve Outcome: Progressing   Problem: Health Behavior/Discharge Planning: Goal: Ability to identify and utilize available resources and services will improve Outcome: Progressing Goal: Ability to manage health-related needs will improve Outcome: Progressing   Problem: Metabolic: Goal: Ability to maintain appropriate glucose levels will improve Outcome: Progressing   Problem: Nutritional: Goal: Maintenance of adequate nutrition will improve Outcome: Progressing Goal: Progress toward achieving an optimal weight will improve Outcome: Progressing   Problem: Skin Integrity: Goal: Risk for impaired skin integrity will decrease Outcome: Progressing   Problem: Tissue Perfusion: Goal: Adequacy of tissue perfusion will improve Outcome: Progressing   Problem: Fluid Volume: Goal: Hemodynamic stability will improve Outcome: Progressing   Problem: Clinical Measurements: Goal: Diagnostic test results will improve Outcome: Progressing Goal: Signs and symptoms of infection will decrease Outcome: Progressing   Problem: Respiratory: Goal: Ability to maintain adequate ventilation will improve Outcome: Progressing   Problem: Education: Goal: Knowledge of disease or condition will improve Outcome: Progressing Goal: Knowledge of the prescribed therapeutic regimen will improve Outcome: Progressing Goal: Individualized Educational Video(s) Outcome: Progressing   Problem: Activity: Goal: Ability to tolerate increased activity will improve Outcome:  Progressing Goal: Will verbalize the importance of balancing activity with adequate rest periods Outcome: Progressing   Problem: Respiratory: Goal: Ability to maintain a clear airway will improve Outcome: Progressing Goal: Levels of oxygenation will improve Outcome: Progressing Goal: Ability to maintain adequate ventilation will improve Outcome: Progressing   Problem: Education: Goal: Knowledge of General Education information will improve Description: Including pain rating scale, medication(s)/side effects and non-pharmacologic comfort measures Outcome: Progressing   Problem: Health Behavior/Discharge Planning: Goal: Ability to manage health-related needs will improve Outcome: Progressing   Problem: Clinical Measurements: Goal: Ability to maintain clinical measurements within normal limits will improve Outcome: Progressing Goal: Will remain free from infection Outcome: Progressing Goal: Diagnostic test results will improve Outcome: Progressing Goal: Respiratory complications will improve Outcome: Progressing Goal: Cardiovascular complication will be avoided Outcome: Progressing   Problem: Activity: Goal: Risk for activity intolerance will decrease Outcome: Progressing   Problem: Nutrition: Goal: Adequate nutrition will be maintained Outcome: Progressing   Problem: Coping: Goal: Level of anxiety will decrease Outcome: Progressing   Problem: Elimination: Goal: Will not experience complications related to bowel motility Outcome: Progressing Goal: Will not experience complications related to urinary retention Outcome: Progressing   Problem: Pain Management: Goal: General experience of comfort will improve Outcome: Progressing   Problem: Safety: Goal: Ability to remain free from injury will improve Outcome: Progressing   Problem: Skin Integrity: Goal: Risk for impaired skin integrity will decrease Outcome: Progressing   Problem: Fluid Volume: Goal:  Hemodynamic stability will improve Outcome: Progressing   Problem: Clinical Measurements: Goal: Signs and symptoms of infection will decrease Outcome: Progressing   Problem: Respiratory: Goal: Ability to maintain adequate ventilation will improve Outcome: Progressing

## 2023-11-22 NOTE — Progress Notes (Signed)
 Patient temp. Now 97.6 orally, patient telling nurse get this thing off of me referring to bair hugger. RN educated patient about importance of maintaining body temperature, patient then yells get this thing off of me. Bair hugger removed from patient. Will continue to monitor temp.

## 2023-11-22 NOTE — Progress Notes (Signed)
 Pt taken to PACU on the ventilator. At the conclusion of the surgery, the patient was not taking adequate tidal volumes. He did not receive any narcotics for the case, but he had received 2mg  of Ativan  prior to coming to the holding room. Because he was an easy intubation and had filled his ETT with mucus, we changed the ETT out for a size 8.0 prior to transport to the PACU. We started a propofol  infusion to keep him sedated while intubated.

## 2023-11-22 NOTE — Anesthesia Procedure Notes (Signed)
 Procedure Name: Intubation Date/Time: 11/22/2023 12:22 PM  Performed by: Neita Small, CRNAPre-anesthesia Checklist: Patient identified, Emergency Drugs available, Suction available and Patient being monitored Patient Re-evaluated:Patient Re-evaluated prior to induction Oxygen  Delivery Method: Circle System Utilized Preoxygenation: Pre-oxygenation with 100% oxygen  Induction Type: IV induction Ventilation: Mask ventilation without difficulty Laryngoscope Size: Mac and 4 Grade View: Grade I Tube type: Oral Tube size: 8.0 mm Number of attempts: 1 Airway Equipment and Method: Stylet and Oral airway Placement Confirmation: ETT inserted through vocal cords under direct vision, positive ETCO2 and breath sounds checked- equal and bilateral Secured at: 24 cm Tube secured with: Tape Dental Injury: Teeth and Oropharynx as per pre-operative assessment

## 2023-11-22 NOTE — Consult Note (Signed)
 NAME:  [name], MRN:  [id number], DOB:  May 09, 1961, LOS: 2 ADMISSION DATE:  11/19/2023, CONSULTATION DATE:  11/22/23 REFERRING MD:  Juvenal, CHIEF COMPLAINT:  SOB   History of Present Illness:  63 year old man w/ hx of EtOH abuse, COPD on O2 at night, recurrent PNA probable aspiration presenting with lethargy and neck swelling.  Found to be in severe sepsis.  Imaging reveals a large necrotic gas-filled abscess extending from submandibular space to manubrium.  Worsening oxygenation and secretions overnights so PCCM consulted.  Talked to mother on phone: FTT for past few months and food aversion as well as unintentional weight loss, diarrhea.  Does not think he's been drinking.  Pertinent  Medical History  Alcohol  abuse Recurrent aspiration COPD followed by [facility] Severe protein calorie malnutrition POA  Significant Hospital Events: Including procedures, antibiotic start and stop dates in addition to other pertinent events   1/7 admit 1/9 worsening agitation, O2 needs  Interim History / Subjective:  Consult  Objective   Blood pressure 129/78, pulse 82, temperature 97.6 F (36.4 C), temperature source Oral, resp. rate (!) 23, height 6' (1.829 m), weight 52.4 kg, SpO2 92%.        Intake/Output Summary (Last 24 hours) at 11/22/2023 0845 Last data filed at 11/22/2023 0500 Gross per 24 hour  Intake 1682.38 ml  Output 300 ml  Net 1382.38 ml   Filed Weights   11/19/23 2044 11/20/23 1816  Weight: 59 kg 52.4 kg    Examination: General: chronically ill man on [place] HENT: malampatti 1, large submandibular swelling noted Lungs: gurgling upper airway sounds c/w retained pharyngeal secretions, no stridor, no accessory muscle use Cardiovascular: RRR, ext warm Abdomen: soft, +BS Extremities: +muscle wasting Neuro: moves to command, intermittent confusion/agitation noted Skin: erythema over neck  Resolved Hospital Problem list   N/A  Assessment & Plan:  Necrotic dental abscess  without clear airway encroachment complicated by worsening respiratory distress multifactorial (septic encephalopathy, delirium, ?EtOH w/d although he denies h/o, baseline COPD, recurrent aspiration hx).  His respiratory and mental status is worse than yesterday.  Does need some sort of drainage and tooth extraction.  Will secure airway now so this doesn't become an emergent issue down the line.  Severe protein calorie malnutrition POA query pulmonary cachexia vs. Chronic aspiration syndrome, FTT, unintentional weight loss  - ICU transfer - Intubation, vent bundle; bronch - ENT following and dentist vs. [facility] being reached out to by Dr. Kimble: defer OR timing to them - Okay for CIWA - Treat wernicke's empirically - Check ammonia and treat PRN - Add anti-toxin clinda to unasyn ; increase unasyn  dosing - Brovana , yupelri , steroids are fine - Once better from infection and mental status clears need to tackle food aversion and FTT  Best Practice (right click and Reselect all SmartList Selections daily)   Diet/type: NPO DVT prophylaxis SCD Pressure ulcer(s): skin tear arm GI prophylaxis: PPI Lines: N/A Foley:  N/A Code Status:  full code Last date of multidisciplinary goals of care discussion [mother updated by phone regarding plan]  Labs   CBC: Recent Labs  Lab 11/19/23 2245 11/20/23 1056 11/21/23 0231 11/22/23 0220  WBC 24.7* 23.7* 19.4* 18.2*  NEUTROABS 22.9*  --  17.8* 16.8*  HGB 14.2 12.3* 13.9 12.3*  HCT 44.3 37.9* 41.5 38.4*  MCV 92.5 90.0 89.2 91.4  PLT 102* 94* 87* 75*    Basic Metabolic Panel: Recent Labs  Lab 11/19/23 2245 11/20/23 0137 11/20/23 1056 11/21/23 0742 11/22/23 0220  NA  136 133* 135 139 146*  K 5.5* 5.8* 5.3* 4.5 4.1  CL 84* 82* 86* 88* 93*  CO2 36* 32 36* 39* 44*  GLUCOSE 116* 107* 153* 158* 50*  BUN 52* 53* 66* 55* 37*  CREATININE 1.14 1.14 1.01 0.52* 0.31*  CALCIUM  8.8* 8.7* 8.2* 8.3* 7.9*  MG 1.7  --  1.5* 1.4* 1.8  PHOS 6.7*  --  5.3*   --   --    GFR: Estimated Creatinine Clearance: 69.1 mL/min (A) (by C-G formula based on SCr of 0.31 mg/dL (L)). Recent Labs  Lab 11/19/23 2245 11/20/23 0137 11/20/23 0409 11/20/23 1056 11/21/23 0231 11/22/23 0220  PROCALCITON 2.56  --   --   --   --   --   WBC 24.7*  --   --  23.7* 19.4* 18.2*  LATICACIDVEN 3.9* 3.2* 2.6*  --   --   --     Liver Function Tests: Recent Labs  Lab 11/19/23 2245 11/20/23 0137 11/20/23 1056 11/21/23 0741 11/22/23 0220  AST 213* 222* 142* 87* 63*  ALT 140* 137* 127* 126* 108*  ALKPHOS 124 140* 112 125 110  BILITOT 1.8* 1.8* 1.3* 1.4* 1.1  PROT 6.2* 5.7* 5.4* 6.1* 5.4*  ALBUMIN  2.9* 2.9* 2.6* 2.8* 2.5*   No results for input(s): LIPASE, AMYLASE in the last 168 hours. Recent Labs  Lab 11/20/23 0137  AMMONIA 59*    ABG    Component Value Date/Time   PHART 7.38 11/22/2023 0313   PCO2ART 81 (HH) 11/22/2023 0313   PO2ART 98 11/22/2023 0313   HCO3 48.0 (H) 11/22/2023 0313   TCO2 22.9 09/14/2010 1750   O2SAT 98.6 11/22/2023 0313     Coagulation Profile: Recent Labs  Lab 11/19/23 2245 11/21/23 0231  INR 2.1* 1.7*    Cardiac Enzymes: Recent Labs  Lab 11/19/23 2245  CKTOTAL 128    HbA1C: HbA1c, POC (prediabetic range)  Date/Time Value Ref Range Status  09/17/2018 03:47 PM 10.6 (A) 5.7 - 6.4 % Final   HbA1c, POC (controlled diabetic range)  Date/Time Value Ref Range Status  09/18/2023 11:08 AM 8.0 (A) 0.0 - 7.0 % Final  02/07/2022 08:58 AM 8.8 (A) 0.0 - 7.0 % Final   Hgb A1c MFr Bld  Date/Time Value Ref Range Status  11/20/2023 04:09 AM 6.8 (H) 4.8 - 5.6 % Final    Comment:    (NOTE)         Prediabetes: 5.7 - 6.4         Diabetes: >6.4         Glycemic control for adults with diabetes: <7.0   05/28/2023 07:33 PM 7.4 (H) 4.8 - 5.6 % Final    Comment:    (NOTE) Pre diabetes:          5.7%-6.4%  Diabetes:              >6.4%  Glycemic control for   <7.0% adults with diabetes     CBG: Recent Labs  Lab  11/21/23 1642 11/21/23 2017 11/22/23 0433 11/22/23 0529 11/22/23 0749  GLUCAP 215* 110* 50* 130* 94    Review of Systems:    Positive Symptoms in bold:  Constitutional fevers, chills, weight loss, fatigue, anorexia, malaise  Eyes decreased vision, double vision, eye irritation  Ears, Nose, Mouth, Throat sore throat, trouble swallowing, sinus congestion  Cardiovascular chest pain, paroxysmal nocturnal dyspnea, lower ext edema, palpitations   Respiratory SOB, cough, DOE, hemoptysis, wheezing  Gastrointestinal nausea, vomiting, diarrhea  Genitourinary  burning with urination, trouble urinating  Musculoskeletal joint aches, joint swelling, back pain  Integumentary  rashes, skin lesions  Neurological focal weakness, focal numbness, trouble speaking, headaches  Psychiatric depression, anxiety, confusion  Endocrine polyuria, polydipsia, cold intolerance, heat intolerance  Hematologic abnormal bruising, abnormal bleeding, unexplained nose bleeds  Allergic/Immunologic recurrent infections, hives, swollen lymph nodes     Past Medical History:  He,  has a past medical history of COPD (chronic obstructive pulmonary disease) (HCC), COVID-19 vaccine series completed (12/09/2020), Diabetes mellitus without complication (HCC), Pancreatitis, and PANCREATITIS, ACUTE (10/19/2010).   Surgical History:   Past Surgical History:  Procedure Laterality Date   NO PAST SURGERIES       Social History:   reports that he has been smoking cigarettes. He has a 35 pack-year smoking history. He has never used smokeless tobacco. He reports current alcohol  use. He reports that he does not use drugs.   Family History:  His family history includes Diabetes in his sister.   Allergies No Known Allergies   Home Medications  Prior to Admission medications   Medication Sig Start Date End Date Taking? Authorizing Provider  glimepiride  (AMARYL ) 1 MG tablet Take 1 tablet (1 mg total) by mouth daily with  breakfast. 09/18/23  Yes Vicci Barnie NOVAK, MD  metFORMIN  (GLUCOPHAGE ) 1000 MG tablet Take 1 tablet (1,000 mg total) by mouth 2 (two) times daily with a meal. Patient taking differently: Take 2,000 mg by mouth daily. 09/18/23  Yes Vicci Barnie NOVAK, MD  budesonide -formoterol  (BREYNA ) 160-4.5 MCG/ACT inhaler Inhale 2 puffs into the lungs in the morning and at bedtime. Patient not taking: Reported on 11/20/2023 09/18/23   Vicci Barnie NOVAK, MD  ipratropium-albuterol  (DUONEB) 0.5-2.5 (3) MG/3ML SOLN Take 3 mLs by nebulization every 4 (four) hours as needed. Patient not taking: Reported on 11/20/2023 09/18/23   Vicci Barnie NOVAK, MD  tiotropium (SPIRIVA  HANDIHALER) 18 MCG inhalation capsule Place 1 capsule (18 mcg total) into inhaler and inhale daily. Patient not taking: Reported on 11/20/2023 09/18/23   Vicci Barnie NOVAK, MD     Critical care time: 34 mins

## 2023-11-22 NOTE — Progress Notes (Signed)
 Dr Kimble has not been able to find oral surgery coverage so since the patient has a abscess in the neck despite not being able to take care of the tooth will proceed with I/D. I talked to patient but he does not seem to be able to comprehend and multiple family members have failed to call back. I talked to Dr Kimble and he said declare  the case an emergency and that is very responsible bc of the gas and abscess that could progress if left untreated. Will proceed with I/D.

## 2023-11-22 NOTE — Progress Notes (Addendum)
 PROGRESS NOTE    [name]  FMW:[id number] DOB: [birth year] DOA: 11/19/2023 PCP: Vicci Barnie NOVAK, MD  Subjective: Awake, not helpful with history.  +cough with thick secretions   Hospital Course: HPI: [name] is a 63 y.o. male with medical history significant of COPD, DM2, chronic pancreatitis, HTN, HLD, alcohol  abuse, tobacco abuse  Presented with cough worsening shortness of breath Increased SOB, hx of EtOH and COpD he sleeps on 2L o2 but does not use during he day Found to have abscess in neck       Significant Imaging Studies: CXR right basilar infiltrate    Consultants: ENT   Assessment and Plan:  pneumonia - RLL- likely aspiration - likely aspiration pneumonia. On IV unasyn  - discharged to home in July 2024 on home O2 @ 2 L/min -wean O2 as able --prior to diet will need SLP Evaluation  Odontogenic abscess -CT scan of neck: Large multiloculated multi spatial likely odontogenic abscess centered around the right submandibular space measuring 5.2 x 3.9 x4.6 cm. Additional rim enhancing components are also present at the floor of the mouth and the subcutaneous soft tissues overlying the thyroid  cartilage with extension inferiorly to the level of the left sternoclavicular joint. 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 -ENT following -[facility] looking for dental coverage   Sepsis with acute organ dysfunction without septic shock (HCC) - continue with IV unasyn . Blood cx negative thus far  Acute on chronic respiratory failure with hypoxia and hypercapnia (HCC) - continue with supplemental O2. Continue with oral/NT suctioning as needed. -PCCM consulted-- ? Need for intubation?  Hyperkalemia -resolved  Hypernatremia -change IVF  COPD with acute exacerbation (HCC) -change back to IV solumedrol  Elevated LFTs -LFTs trending down  Type 2 diabetes mellitus with hyperglycemia (HCC) -blood sugar low and NPO -monitor but only treat if  >250  Alcohol  abuse -On CIWA . Monitor his breathing as benzos could make his respiratory failure worse.  Chronic pancreatitis, unspecified pancreatitis type (HCC) -chronic.  TOBACCO ABUSE - Pt has been advised to quit.  Elevated INR -received 10 mg SQ vitamin K yesterday. INR down to 1.7. likely related to nutritional deficiency vs etoh abuse   DVT prophylaxis: SCDs Start: 11/20/23 0700     Code Status: Full Code Family Communication: called mother Disposition Plan: home vs SNF- will need PT eval when more stable Reason for continuing need for hospitalization: remains on IV Abx.  Objective: Vitals:   11/22/23 0130 11/22/23 0253 11/22/23 0400 11/22/23 0750  BP:  121/86 116/79 129/78  Pulse: 67 80 75 82  Resp:  20 15 (!) 23  Temp:  (!) 97.5 F (36.4 C) 97.6 F (36.4 C) 97.6 F (36.4 C)  TempSrc:  Oral Oral Oral  SpO2:  93% 93% 92%  Weight:      Height:        Intake/Output Summary (Last 24 hours) at 11/22/2023 9177 Last data filed at 11/22/2023 0500 Gross per 24 hour  Intake 1682.38 ml  Output 300 ml  Net 1382.38 ml   Filed Weights   11/19/23 2044 11/20/23 1816  Weight: 59 kg 52.4 kg    Examination:   General: Appearance:    Cachectic male in no acute distress     Lungs:     On [place], diminished  Heart:    Normal heart rate.    MS:   All extremities are intact.   Neurologic:   Irritable-- will answer questions    Data Reviewed: I  have personally reviewed following labs and imaging studies  CBC: Recent Labs  Lab 11/19/23 2245 11/20/23 1056 11/21/23 0231 11/22/23 0220  WBC 24.7* 23.7* 19.4* 18.2*  NEUTROABS 22.9*  --  17.8* 16.8*  HGB 14.2 12.3* 13.9 12.3*  HCT 44.3 37.9* 41.5 38.4*  MCV 92.5 90.0 89.2 91.4  PLT 102* 94* 87* 75*   Basic Metabolic Panel: Recent Labs  Lab 11/19/23 2245 11/20/23 0137 11/20/23 1056 11/21/23 0742 11/22/23 0220  NA 136 133* 135 139 146*  K 5.5* 5.8* 5.3* 4.5 4.1  CL 84* 82* 86* 88* 93*  CO2 36* 32 36* 39* 44*   GLUCOSE 116* 107* 153* 158* 50*  BUN 52* 53* 66* 55* 37*  CREATININE 1.14 1.14 1.01 0.52* 0.31*  CALCIUM  8.8* 8.7* 8.2* 8.3* 7.9*  MG 1.7  --  1.5* 1.4* 1.8  PHOS 6.7*  --  5.3*  --   --    GFR: Estimated Creatinine Clearance: 69.1 mL/min (A) (by C-G formula based on SCr of 0.31 mg/dL (L)). Liver Function Tests: Recent Labs  Lab 11/19/23 2245 11/20/23 0137 11/20/23 1056 11/21/23 0741 11/22/23 0220  AST 213* 222* 142* 87* 63*  ALT 140* 137* 127* 126* 108*  ALKPHOS 124 140* 112 125 110  BILITOT 1.8* 1.8* 1.3* 1.4* 1.1  PROT 6.2* 5.7* 5.4* 6.1* 5.4*  ALBUMIN  2.9* 2.9* 2.6* 2.8* 2.5*    Recent Labs  Lab 11/20/23 0137  AMMONIA 59*   Coagulation Profile: Recent Labs  Lab 11/19/23 2245 11/21/23 0231  INR 2.1* 1.7*   Cardiac Enzymes: Recent Labs  Lab 11/19/23 2245  CKTOTAL 128   BNP (last 3 results) Recent Labs    05/29/23 0731 11/19/23 2245  BNP 207.3* 957.7*   HbA1C: Recent Labs    11/20/23 0409  HGBA1C 6.8*   CBG: Recent Labs  Lab 11/21/23 1642 11/21/23 2017 11/22/23 0433 11/22/23 0529 11/22/23 0749  GLUCAP 215* 110* 50* 130* 94   Thyroid  Function Tests: Recent Labs    11/19/23 2245  TSH 2.802   Sepsis Labs: Recent Labs  Lab 11/19/23 2245 11/20/23 0137 11/20/23 0409  PROCALCITON 2.56  --   --   LATICACIDVEN 3.9* 3.2* 2.6*    Recent Results (from the past 240 hours)  Blood culture (routine x 2)     Status: None (Preliminary result)   Collection Time: 11/19/23 10:50 PM   Specimen: BLOOD RIGHT FOREARM  Result Value Ref Range Status   Specimen Description BLOOD RIGHT FOREARM  Final   Special Requests   Final    BOTTLES DRAWN AEROBIC AND ANAEROBIC Blood Culture results may not be optimal due to an inadequate volume of blood received in culture bottles   Culture   Final    NO GROWTH 2 DAYS Performed at [facility], [address] [place], [place] [address]    Report Status PENDING  Incomplete  Blood culture (routine x 2)      Status: None (Preliminary result)   Collection Time: 11/19/23 11:00 PM   Specimen: BLOOD  Result Value Ref Range Status   Specimen Description BLOOD RIGHT ANTECUBITAL  Final   Special Requests   Final    BOTTLES DRAWN AEROBIC AND ANAEROBIC Blood Culture results may not be optimal due to an inadequate volume of blood received in culture bottles   Culture   Final    NO GROWTH 2 DAYS Performed at [facility], [address] [place], [place] [address]    Report Status  PENDING  Incomplete  Resp panel by RT-PCR (RSV, Flu A&B, Covid) Anterior Nasal Swab     Status: None   Collection Time: 11/19/23 11:10 PM   Specimen: Anterior Nasal Swab  Result Value Ref Range Status   SARS Coronavirus 2 by RT PCR NEGATIVE NEGATIVE Final   Influenza A by PCR NEGATIVE NEGATIVE Final   Influenza B by PCR NEGATIVE NEGATIVE Final    Comment: (NOTE) The Xpert Xpress SARS-CoV-2/FLU/RSV plus assay is intended as an aid in the diagnosis of influenza from Nasopharyngeal swab specimens and should not be used as a sole basis for treatment. Nasal washings and aspirates are unacceptable for Xpert Xpress SARS-CoV-2/FLU/RSV testing.  Fact Sheet for Patients: bloggercourse.com  Fact Sheet for Healthcare Providers: seriousbroker.it  This test is not yet approved or cleared by the [place]  FDA and has been authorized for detection and/or diagnosis of SARS-CoV-2 by FDA under an Emergency Use Authorization (EUA). This EUA will remain in effect (meaning this test can be used) for the duration of the COVID-19 declaration under Section 564(b)(1) of the Act, 21 U.S.C. section 360bbb-3(b)(1), unless the authorization is terminated or revoked.     Resp Syncytial Virus by PCR NEGATIVE NEGATIVE Final    Comment: (NOTE) Fact Sheet for Patients: bloggercourse.com  Fact Sheet for Healthcare  Providers: seriousbroker.it  This test is not yet approved or cleared by the [place]  FDA and has been authorized for detection and/or diagnosis of SARS-CoV-2 by FDA under an Emergency Use Authorization (EUA). This EUA will remain in effect (meaning this test can be used) for the duration of the COVID-19 declaration under Section 564(b)(1) of the Act, 21 U.S.C. section 360bbb-3(b)(1), unless the authorization is terminated or revoked.  Performed at [facility], [address], [place], [place] [address]   Culture, blood (routine x 2) Call MD if unable to obtain prior to antibiotics being given     Status: None (Preliminary result)   Collection Time: 11/20/23  1:40 AM   Specimen: BLOOD  Result Value Ref Range Status   Specimen Description BLOOD RIGHT ANTECUBITAL  Final   Special Requests   Final    BOTTLES DRAWN AEROBIC AND ANAEROBIC Blood Culture results may not be optimal due to an inadequate volume of blood received in culture bottles   Culture   Final    NO GROWTH 1 DAY Performed at [facility], [address], [place], [place] [address]    Report Status PENDING  Incomplete  Culture, blood (routine x 2) Call MD if unable to obtain prior to antibiotics being given     Status: None (Preliminary result)   Collection Time: 11/20/23  1:55 AM   Specimen: BLOOD  Result Value Ref Range Status   Specimen Description BLOOD LEFT ANTECUBITAL  Final   Special Requests   Final    BOTTLES DRAWN AEROBIC AND ANAEROBIC Blood Culture results may not be optimal due to an inadequate volume of blood received in culture bottles   Culture   Final    NO GROWTH 1 DAY Performed at [facility], [address], [place], [place] [address]    Report Status PENDING  Incomplete  MRSA Next Gen by PCR, Nasal     Status: None   Collection Time: 11/20/23  7:00 AM   Specimen: Nasal Mucosa; Nasal Swab  Result Value Ref Range Status   MRSA by PCR Next Gen NOT  DETECTED NOT DETECTED Final    Comment: (NOTE)  The GeneXpert MRSA Assay (FDA approved for NASAL specimens only), is one component of a comprehensive MRSA colonization surveillance program. It is not intended to diagnose MRSA infection nor to guide or monitor treatment for MRSA infections. Test performance is not FDA approved in patients less than 63 years old. Performed at [facility], [address], [place], [place] [address]      Radiology Studies: DG CHEST PORT 1 VIEW Result Date: 11/22/2023 CLINICAL DATA:  Aspiration into airway, shortness of breath and low oxygen  saturations EXAM: PORTABLE CHEST 1 VIEW COMPARISON:  Radiograph 11/19/2023 FINDINGS: Stable cardiomediastinal silhouette. Airspace and interstitial opacities in the right-greater-than-left lower lungs. The left costophrenic angle is excluded from the image. No pneumothorax. No displaced rib fractures. IMPRESSION: Right-greater-than-left lower lung airspace and interstitial opacities consistent with aspiration and/or pneumonia. Electronically Signed   By: Norman Gatlin M.D.   On: 11/22/2023 01:28   CT SOFT TISSUE NECK W CONTRAST Result Date: 11/21/2023 CLINICAL DATA:  Soft tissue infection suspected, neck, xray done radiologist recommended soft tissue neck with IV contrast EXAM: CT NECK WITH CONTRAST TECHNIQUE: Multidetector CT imaging of the neck was performed using the standard protocol following the bolus administration of intravenous contrast. RADIATION DOSE REDUCTION: This exam was performed according to the departmental dose-optimization program which includes automated exposure control, adjustment of the mA and/or kV according to patient size and/or use of iterative reconstruction technique. CONTRAST:  75mL OMNIPAQUE  IOHEXOL  350 MG/ML SOLN COMPARISON:  None Available. FINDINGS: Pharynx and larynx: There is a large multiloculated multi spatial complex air and fluid containing rim enhancing fluid collection with the  dominant collection centered around the right submandibular space measuring 5.2 x 3.9 x 4.6 cm (series 4, image 64). Additional rim enhancing components are also present at the floor of the mouth (series 4, image 55), the subcutaneous soft tissues overlying the thyroid  cartilage with a extension inferiorly to the level of the left sternoclavicular joint (series 4, image 88-101). Bilateral tonsils are normal in appearance. The epiglottis is normal in appearance. There are layering secretions in the hypopharynx. Central airways patent. Salivary glands: Bilateral salivary glands are poorly visualized due to poor contrast resolution. Thyroid : Normal. Lymph nodes: None enlarged or abnormal density. Vascular: Negative. Limited intracranial: Negative. Visualized orbits: Negative. Mastoids and visualized paranasal sinuses: No middle ear or mastoid effusion. Paranasal sinuses are notable for mild mucosal thickening of the floor of bilateral maxillary sinuses. Orbits are unremarkable. Skeleton: No acute fracture. There is odontogenic disease with multiple large dental caries. Upper chest: Moderate to severe centrilobular emphysema. See separately dictated CT chest for additional findings Other: None. IMPRESSION: 1. Large multiloculated multi spatial likely odontogenic abscess centered around the right submandibular space measuring 5.2 x 3.9 x 4.6 cm. Additional rim enhancing components are also present at the floor of the mouth and the subcutaneous soft tissues overlying the thyroid  cartilage with extension inferiorly to the level of the left sternoclavicular joint. 2. 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 Emphysema (ICD10-J43.9). Electronically Signed   By: Lyndall Gore M.D.   On: 11/21/2023 13:34   CT CHEST WO CONTRAST Addendum Date: 11/21/2023 ADDENDUM REPORT: 11/21/2023 11:36 ADDENDUM: Study discussed by telephone with Dr. CAMELLIA CHEN on 11/21/2023 at 1036 hours. Stat Neck CT with IV contrast is pending.  Electronically Signed   By: VEAR Hurst M.D.   On: 11/21/2023 11:36   Result Date: 11/21/2023 CLINICAL DATA:  63 year old male with shortness of breath and abnormal right lung base opacity. History of  smoking, COPD. EXAM: CT CHEST WITHOUT CONTRAST TECHNIQUE: Multidetector CT imaging of the chest was performed following the standard protocol without IV contrast. RADIATION DOSE REDUCTION: This exam was performed according to the departmental dose-optimization program which includes automated exposure control, adjustment of the mA and/or kV according to patient size and/or use of iterative reconstruction technique. COMPARISON:  Chest radiographs 11/19/2023.  Chest CT 07/03/2023. FINDINGS: Cardiovascular: Normal heart size. No evidence of pericardial effusion. Calcified aortic atherosclerosis. Vascular patency is not evaluated in the absence of IV contrast. Mediastinum/Nodes: No mediastinal mass or lymphadenopathy evident on this noncontrast exam. Lungs/Pleura: Moderate to severe bilateral emphysema. Similar lung volumes to the CT last year. Trachea and central airways are patent. But at the bronchus intermedius there is layering fluid or secretion in the right lower lobe bronchus and the distal airways are largely opacified (series 4, image 97). Widespread patchy, peribronchial opacity in the lateral segment of the right middle lobe. Consolidation in the posterior basal segment of the right lower lobe with air bronchograms. Less pronounced patchy and dependent left lower lobe opacity also. Trace if any pleural fluid. No other consolidation. Upper lobes, lingula appear stable since August. Upper Abdomen: Limited detail of the upper abdominal viscera due to noncontrast technique and very little visceral fat. No upper abdominal free air or free fluid. Calcified aortic atherosclerosis. Faint oral contrast in bowel in the left upper quadrant. Musculoskeletal: No acute or suspicious osseous lesion is identified in the chest.  Other findings: Partially visible abnormal soft tissue gas in the submental, anterior neck on series 3, image 1. This tracks anterior to the right thyroid  cartilage and larynx. Grossly normal visible laryngeal contours. Nearby thyroid  appears diminutive and negative. IMPRESSION: 1. Abnormal soft tissue gas partially visible in the anterior neck. If no penetrating trauma then this is suspicious for a gas forming, necrotizing soft tissue infection. Neck CT with IV contrast might evaluate further. 2. Right middle and right greater than left lower lobe bronchopneumonia/pneumonia with airway secretions and opacification. Consider Aspiration. Underlying advanced Emphysema (ICD10-J43.9). Trace if any pleural fluid. 3. Cachexia. 4. Aortic Atherosclerosis (ICD10-I70.0). Electronically Signed: By: VEAR Hurst M.D. On: 11/21/2023 09:58    Scheduled Meds:  insulin  aspart  0-9 Units Subcutaneous Q4H   methylPREDNISolone  (SOLU-MEDROL ) injection  40 mg Intravenous Daily   nicotine   14 mg Transdermal Daily   thiamine   100 mg Intravenous Daily   Continuous Infusions:  sodium chloride  75 mL/hr at 11/21/23 1829   ampicillin -sulbactam (UNASYN ) IV 1.5 g (11/22/23 0445)     LOS: 2 days   Time spent: 45 minutes  Harlene RAYMOND Bowl, DO  [facility]  11/22/2023, 8:22 AM

## 2023-11-22 NOTE — Progress Notes (Signed)
 11/22/2023 Eval'd airway.  Pretty open.  Will be available should he develop increased WOB, increased swelling etc.  Myrla Halsted MD PCCM

## 2023-11-22 NOTE — Progress Notes (Signed)
 Unable to obtain oral or axillary temp on patient, rectal temp is 94.1. MD paged, Bair Hugger ordered and has been applied.

## 2023-11-22 NOTE — Consult Note (Signed)
 Reason for Consult:dental abscess Referring Physician: Dr Juvenal Charleston [name] is an 63 y.o. male.  HPI: Patient is not sure how long he is getting swelling in his neck.  He denies any previous abscess issues.  He does not know if he is ever had any dental problems.  He is not having any significant pain in his mouth and neck area.  He is having no breathing difficulties that are new since his lung problem.  Past Medical History:  Diagnosis Date   COPD (chronic obstructive pulmonary disease) (HCC)    COVID-19 vaccine series completed 12/09/2020   Diabetes mellitus without complication (HCC)    Pancreatitis    PANCREATITIS, ACUTE 10/19/2010   Qualifier: Diagnosis of   By: Gladis FNP, Delorise          Past Surgical History:  Procedure Laterality Date   NO PAST SURGERIES      Family History  Problem Relation Age of Onset   Diabetes Sister     Social History:  reports that he has been smoking cigarettes. He has a 35 pack-year smoking history. He has never used smokeless tobacco. He reports current alcohol  use. He reports that he does not use drugs.  Allergies: No Known Allergies  Medications: I have reviewed the patient's current medications.  Results for orders placed or performed during the hospital encounter of 11/19/23 (from the past 48 hours)  Magnesium      Status: Abnormal   Collection Time: 11/20/23 10:56 AM  Result Value Ref Range   Magnesium  1.5 (L) 1.7 - 2.4 mg/dL    Comment: Performed at [facility], [address], [place], [place] [address]  Phosphorus     Status: Abnormal   Collection Time: 11/20/23 10:56 AM  Result Value Ref Range   Phosphorus 5.3 (H) 2.5 - 4.6 mg/dL    Comment: Performed at [facility], [address] [place], [place] [address]  Comprehensive metabolic panel     Status: Abnormal   Collection Time: 11/20/23 10:56 AM  Result Value Ref Range   Sodium 135 135 - 145 mmol/L   Potassium 5.3 (H) 3.5 - 5.1 mmol/L   Chloride 86  (L) 98 - 111 mmol/L   CO2 36 (H) 22 - 32 mmol/L   Glucose, Bld 153 (H) 70 - 99 mg/dL    Comment: Glucose reference range applies only to samples taken after fasting for at least 8 hours.   BUN 66 (H) 8 - 23 mg/dL   Creatinine, Ser 8.98 0.61 - 1.24 mg/dL   Calcium  8.2 (L) 8.9 - 10.3 mg/dL   Total Protein 5.4 (L) 6.5 - 8.1 g/dL   Albumin  2.6 (L) 3.5 - 5.0 g/dL   AST 857 (H) 15 - 41 U/L   ALT 127 (H) 0 - 44 U/L   Alkaline Phosphatase 112 38 - 126 U/L   Total Bilirubin 1.3 (H) 0.0 - 1.2 mg/dL   GFR, Estimated >39 >39 mL/min    Comment: (NOTE) Calculated using the CKD-EPI Creatinine Equation (2021)    Anion gap 13 5 - 15    Comment: Performed at [facility], [address] [place], [place] [address]  CBC     Status: Abnormal   Collection Time: 11/20/23 10:56 AM  Result Value Ref Range   WBC 23.7 (H) 4.0 - 10.5 K/uL   RBC 4.21 (L) 4.22 - 5.81 MIL/uL   Hemoglobin 12.3 (L) 13.0 - 17.0 g/dL   HCT 62.0 (L)  39.0 - 52.0 %   MCV 90.0 80.0 - 100.0 fL   MCH 29.2 26.0 - 34.0 pg   MCHC 32.5 30.0 - 36.0 g/dL   RDW 84.9 88.4 - 84.4 %   Platelets 94 (L) 150 - 400 K/uL    Comment: Immature Platelet Fraction may be clinically indicated, consider ordering this additional test [id number] REPEATED TO VERIFY    nRBC 0.1 0.0 - 0.2 %    Comment: Performed at [facility], [address], [place], [place] [address]  CBG monitoring, ED     Status: Abnormal   Collection Time: 11/20/23 11:59 AM  Result Value Ref Range   Glucose-Capillary 145 (H) 70 - 99 mg/dL    Comment: Glucose reference range applies only to samples taken after fasting for at least 8 hours.   Comment 1 Notify RN    Comment 2 Document in Chart   CBG monitoring, ED     Status: Abnormal   Collection Time: 11/20/23  3:38 PM  Result Value Ref Range   Glucose-Capillary 114 (H) 70 - 99 mg/dL    Comment: Glucose reference range applies only to samples taken after fasting for at least 8 hours.  Glucose, capillary     Status:  Abnormal   Collection Time: 11/20/23  8:32 PM  Result Value Ref Range   Glucose-Capillary 117 (H) 70 - 99 mg/dL    Comment: Glucose reference range applies only to samples taken after fasting for at least 8 hours.   Comment 1 Notify RN    Comment 2 Document in Chart   Glucose, capillary     Status: Abnormal   Collection Time: 11/20/23 11:42 PM  Result Value Ref Range   Glucose-Capillary 187 (H) 70 - 99 mg/dL    Comment: Glucose reference range applies only to samples taken after fasting for at least 8 hours.   Comment 1 Notify RN    Comment 2 Document in Chart   Protime-INR     Status: Abnormal   Collection Time: 11/21/23  2:31 AM  Result Value Ref Range   Prothrombin Time 19.8 (H) 11.4 - 15.2 seconds   INR 1.7 (H) 0.8 - 1.2    Comment: (NOTE) INR goal varies based on device and disease states. Performed at [facility], [address] [place], [place] [address]   CBC with Differential/Platelet     Status: Abnormal   Collection Time: 11/21/23  2:31 AM  Result Value Ref Range   WBC 19.4 (H) 4.0 - 10.5 K/uL   RBC 4.65 4.22 - 5.81 MIL/uL   Hemoglobin 13.9 13.0 - 17.0 g/dL   HCT 58.4 60.9 - 47.9 %   MCV 89.2 80.0 - 100.0 fL   MCH 29.9 26.0 - 34.0 pg   MCHC 33.5 30.0 - 36.0 g/dL   RDW 84.5 88.4 - 84.4 %   Platelets 87 (L) 150 - 400 K/uL    Comment: REPEATED TO VERIFY   nRBC 0.2 0.0 - 0.2 %   Neutrophils Relative % 91 %   Neutro Abs 17.8 (H) 1.7 - 7.7 K/uL   Lymphocytes Relative 2 %   Lymphs Abs 0.3 (L) 0.7 - 4.0 K/uL   Monocytes Relative 4 %   Monocytes Absolute 0.7 0.1 - 1.0 K/uL   Eosinophils Relative 0 %   Eosinophils Absolute 0.0 0.0 - 0.5 K/uL   Basophils Relative 1 %   Basophils Absolute 0.1 0.0 - 0.1 K/uL   Immature Granulocytes 2 %  Abs Immature Granulocytes 0.41 (H) 0.00 - 0.07 K/uL    Comment: Performed at [facility], [address], [place], [place] [address]  Glucose, capillary     Status: Abnormal   Collection Time: 11/21/23  3:48 AM   Result Value Ref Range   Glucose-Capillary 156 (H) 70 - 99 mg/dL    Comment: Glucose reference range applies only to samples taken after fasting for at least 8 hours.   Comment 1 Notify RN    Comment 2 Document in Chart   Hepatic function panel     Status: Abnormal   Collection Time: 11/21/23  7:41 AM  Result Value Ref Range   Total Protein 6.1 (L) 6.5 - 8.1 g/dL   Albumin  2.8 (L) 3.5 - 5.0 g/dL   AST 87 (H) 15 - 41 U/L   ALT 126 (H) 0 - 44 U/L   Alkaline Phosphatase 125 38 - 126 U/L   Total Bilirubin 1.4 (H) 0.0 - 1.2 mg/dL   Bilirubin, Direct 0.4 (H) 0.0 - 0.2 mg/dL   Indirect Bilirubin 1.0 (H) 0.3 - 0.9 mg/dL    Comment: Performed at [facility], [address] [place], [place] [address]  Basic metabolic panel     Status: Abnormal   Collection Time: 11/21/23  7:42 AM  Result Value Ref Range   Sodium 139 135 - 145 mmol/L   Potassium 4.5 3.5 - 5.1 mmol/L   Chloride 88 (L) 98 - 111 mmol/L   CO2 39 (H) 22 - 32 mmol/L   Glucose, Bld 158 (H) 70 - 99 mg/dL    Comment: Glucose reference range applies only to samples taken after fasting for at least 8 hours.   BUN 55 (H) 8 - 23 mg/dL   Creatinine, Ser 9.47 (L) 0.61 - 1.24 mg/dL   Calcium  8.3 (L) 8.9 - 10.3 mg/dL   GFR, Estimated >39 >39 mL/min    Comment: (NOTE) Calculated using the CKD-EPI Creatinine Equation (2021)    Anion gap 12 5 - 15    Comment: Performed at [facility], [address], [place], [place] [address]  Magnesium      Status: Abnormal   Collection Time: 11/21/23  7:42 AM  Result Value Ref Range   Magnesium  1.4 (L) 1.7 - 2.4 mg/dL    Comment: Performed at [facility], [address] [place], [place] [address]  Glucose, capillary     Status: Abnormal   Collection Time: 11/21/23  8:24 AM  Result Value Ref Range   Glucose-Capillary 157 (H) 70 - 99 mg/dL    Comment: Glucose reference range applies only to samples taken after fasting for at least 8 hours.  Glucose, capillary     Status:  Abnormal   Collection Time: 11/21/23 12:15 PM  Result Value Ref Range   Glucose-Capillary 243 (H) 70 - 99 mg/dL    Comment: Glucose reference range applies only to samples taken after fasting for at least 8 hours.  Glucose, capillary     Status: Abnormal   Collection Time: 11/21/23  4:42 PM  Result Value Ref Range   Glucose-Capillary 215 (H) 70 - 99 mg/dL    Comment: Glucose reference range applies only to samples taken after fasting for at least 8 hours.  Glucose, capillary     Status: Abnormal   Collection Time: 11/21/23  8:17 PM  Result Value Ref Range   Glucose-Capillary 110 (H) 70 - 99 mg/dL    Comment: Glucose  reference range applies only to samples taken after fasting for at least 8 hours.   Comment 1 Notify RN    Comment 2 Document in Chart   Comprehensive metabolic panel     Status: Abnormal   Collection Time: 11/22/23  2:20 AM  Result Value Ref Range   Sodium 146 (H) 135 - 145 mmol/L   Potassium 4.1 3.5 - 5.1 mmol/L   Chloride 93 (L) 98 - 111 mmol/L   CO2 44 (H) 22 - 32 mmol/L   Glucose, Bld 50 (L) 70 - 99 mg/dL    Comment: Glucose reference range applies only to samples taken after fasting for at least 8 hours.   BUN 37 (H) 8 - 23 mg/dL   Creatinine, Ser 9.68 (L) 0.61 - 1.24 mg/dL   Calcium  7.9 (L) 8.9 - 10.3 mg/dL   Total Protein 5.4 (L) 6.5 - 8.1 g/dL   Albumin  2.5 (L) 3.5 - 5.0 g/dL   AST 63 (H) 15 - 41 U/L   ALT 108 (H) 0 - 44 U/L   Alkaline Phosphatase 110 38 - 126 U/L   Total Bilirubin 1.1 0.0 - 1.2 mg/dL   GFR, Estimated >39 >39 mL/min    Comment: (NOTE) Calculated using the CKD-EPI Creatinine Equation (2021)    Anion gap 9 5 - 15    Comment: ELECTROLYTES REPEATED TO VERIFY Performed at [facility], [address] [place], [place] [address]   CBC with Differential/Platelet     Status: Abnormal   Collection Time: 11/22/23  2:20 AM  Result Value Ref Range   WBC 18.2 (H) 4.0 - 10.5 K/uL   RBC 4.20 (L) 4.22 - 5.81 MIL/uL   Hemoglobin 12.3 (L) 13.0  - 17.0 g/dL   HCT 61.5 (L) 60.9 - 47.9 %   MCV 91.4 80.0 - 100.0 fL   MCH 29.3 26.0 - 34.0 pg   MCHC 32.0 30.0 - 36.0 g/dL   RDW 84.4 88.4 - 84.4 %   Platelets 75 (L) 150 - 400 K/uL    Comment: Immature Platelet Fraction may be clinically indicated, consider ordering this additional test [id number] REPEATED TO VERIFY    nRBC 0.1 0.0 - 0.2 %   Neutrophils Relative % 92 %   Neutro Abs 16.8 (H) 1.7 - 7.7 K/uL   Lymphocytes Relative 2 %   Lymphs Abs 0.3 (L) 0.7 - 4.0 K/uL   Monocytes Relative 5 %   Monocytes Absolute 0.8 0.1 - 1.0 K/uL   Eosinophils Relative 0 %   Eosinophils Absolute 0.1 0.0 - 0.5 K/uL   Basophils Relative 0 %   Basophils Absolute 0.1 0.0 - 0.1 K/uL   Immature Granulocytes 1 %   Abs Immature Granulocytes 0.15 (H) 0.00 - 0.07 K/uL    Comment: Performed at [facility], [address] [place], [place] [address]  Magnesium      Status: None   Collection Time: 11/22/23  2:20 AM  Result Value Ref Range   Magnesium  1.8 1.7 - 2.4 mg/dL    Comment: Performed at [facility], [address], [place], [place] [address]  Blood gas, arterial     Status: Abnormal   Collection Time: 11/22/23  3:13 AM  Result Value Ref Range   pH, Arterial 7.38 7.35 - 7.45   pCO2 arterial 81 (HH) 32 - 48 mmHg    Comment: CRITICAL RESULT CALLED TO, READ BACK BY AND VERIFIED WITH: A.COOPER RN [id number] 11/22/2023 BY G.GANADEN  pO2, Arterial 98 83 - 108 mmHg   Bicarbonate 48.0 (H) 20.0 - 28.0 mmol/L   Acid-Base Excess 18.1 (H) 0.0 - 2.0 mmol/L   O2 Saturation 98.6 %   Patient temperature 36.4    Collection site RIGHT RADIAL    Drawn by RONELLE A.    Allens test (pass/fail) PASS PASS    Comment: Performed at [facility], [address] [place], [place] [address]  Glucose, capillary     Status: Abnormal   Collection Time: 11/22/23  4:33 AM  Result Value Ref Range   Glucose-Capillary 50 (L) 70 - 99 mg/dL    Comment: Glucose reference range applies only to samples taken  after fasting for at least 8 hours.   Comment 1 Notify RN    Comment 2 Document in Chart   Glucose, capillary     Status: Abnormal   Collection Time: 11/22/23  5:29 AM  Result Value Ref Range   Glucose-Capillary 130 (H) 70 - 99 mg/dL    Comment: Glucose reference range applies only to samples taken after fasting for at least 8 hours.  Glucose, capillary     Status: None   Collection Time: 11/22/23  7:49 AM  Result Value Ref Range   Glucose-Capillary 94 70 - 99 mg/dL    Comment: Glucose reference range applies only to samples taken after fasting for at least 8 hours.    DG CHEST PORT 1 VIEW Result Date: 11/22/2023 CLINICAL DATA:  Aspiration into airway, shortness of breath and low oxygen  saturations EXAM: PORTABLE CHEST 1 VIEW COMPARISON:  Radiograph 11/19/2023 FINDINGS: Stable cardiomediastinal silhouette. Airspace and interstitial opacities in the right-greater-than-left lower lungs. The left costophrenic angle is excluded from the image. No pneumothorax. No displaced rib fractures. IMPRESSION: Right-greater-than-left lower lung airspace and interstitial opacities consistent with aspiration and/or pneumonia. Electronically Signed   By: Norman Gatlin M.D.   On: 11/22/2023 01:28   CT SOFT TISSUE NECK W CONTRAST Result Date: 11/21/2023 CLINICAL DATA:  Soft tissue infection suspected, neck, xray done radiologist recommended soft tissue neck with IV contrast EXAM: CT NECK WITH CONTRAST TECHNIQUE: Multidetector CT imaging of the neck was performed using the standard protocol following the bolus administration of intravenous contrast. RADIATION DOSE REDUCTION: This exam was performed according to the departmental dose-optimization program which includes automated exposure control, adjustment of the mA and/or kV according to patient size and/or use of iterative reconstruction technique. CONTRAST:  75mL OMNIPAQUE  IOHEXOL  350 MG/ML SOLN COMPARISON:  None Available. FINDINGS: Pharynx and larynx: There is a  large multiloculated multi spatial complex air and fluid containing rim enhancing fluid collection with the dominant collection centered around the right submandibular space measuring 5.2 x 3.9 x 4.6 cm (series 4, image 64). Additional rim enhancing components are also present at the floor of the mouth (series 4, image 55), the subcutaneous soft tissues overlying the thyroid  cartilage with a extension inferiorly to the level of the left sternoclavicular joint (series 4, image 88-101). Bilateral tonsils are normal in appearance. The epiglottis is normal in appearance. There are layering secretions in the hypopharynx. Central airways patent. Salivary glands: Bilateral salivary glands are poorly visualized due to poor contrast resolution. Thyroid : Normal. Lymph nodes: None enlarged or abnormal density. Vascular: Negative. Limited intracranial: Negative. Visualized orbits: Negative. Mastoids and visualized paranasal sinuses: No middle ear or mastoid effusion. Paranasal sinuses are notable for mild mucosal thickening of the floor of bilateral maxillary sinuses. Orbits are unremarkable. Skeleton: No acute fracture. There is odontogenic  disease with multiple large dental caries. Upper chest: Moderate to severe centrilobular emphysema. See separately dictated CT chest for additional findings Other: None. IMPRESSION: 1. Large multiloculated multi spatial likely odontogenic abscess centered around the right submandibular space measuring 5.2 x 3.9 x 4.6 cm. Additional rim enhancing components are also present at the floor of the mouth and the subcutaneous soft tissues overlying the thyroid  cartilage with extension inferiorly to the level of the left sternoclavicular joint. 2. 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 Emphysema (ICD10-J43.9). Electronically Signed   By: Lyndall Gore M.D.   On: 11/21/2023 13:34   CT CHEST WO CONTRAST Addendum Date: 11/21/2023 ADDENDUM REPORT: 11/21/2023 11:36 ADDENDUM: Study  discussed by telephone with Dr. CAMELLIA CHEN on 11/21/2023 at 1036 hours. Stat Neck CT with IV contrast is pending. Electronically Signed   By: VEAR Hurst M.D.   On: 11/21/2023 11:36   Result Date: 11/21/2023 CLINICAL DATA:  63 year old male with shortness of breath and abnormal right lung base opacity. History of smoking, COPD. EXAM: CT CHEST WITHOUT CONTRAST TECHNIQUE: Multidetector CT imaging of the chest was performed following the standard protocol without IV contrast. RADIATION DOSE REDUCTION: This exam was performed according to the departmental dose-optimization program which includes automated exposure control, adjustment of the mA and/or kV according to patient size and/or use of iterative reconstruction technique. COMPARISON:  Chest radiographs 11/19/2023.  Chest CT 07/03/2023. FINDINGS: Cardiovascular: Normal heart size. No evidence of pericardial effusion. Calcified aortic atherosclerosis. Vascular patency is not evaluated in the absence of IV contrast. Mediastinum/Nodes: No mediastinal mass or lymphadenopathy evident on this noncontrast exam. Lungs/Pleura: Moderate to severe bilateral emphysema. Similar lung volumes to the CT last year. Trachea and central airways are patent. But at the bronchus intermedius there is layering fluid or secretion in the right lower lobe bronchus and the distal airways are largely opacified (series 4, image 97). Widespread patchy, peribronchial opacity in the lateral segment of the right middle lobe. Consolidation in the posterior basal segment of the right lower lobe with air bronchograms. Less pronounced patchy and dependent left lower lobe opacity also. Trace if any pleural fluid. No other consolidation. Upper lobes, lingula appear stable since August. Upper Abdomen: Limited detail of the upper abdominal viscera due to noncontrast technique and very little visceral fat. No upper abdominal free air or free fluid. Calcified aortic atherosclerosis. Faint oral contrast in bowel in  the left upper quadrant. Musculoskeletal: No acute or suspicious osseous lesion is identified in the chest. Other findings: Partially visible abnormal soft tissue gas in the submental, anterior neck on series 3, image 1. This tracks anterior to the right thyroid  cartilage and larynx. Grossly normal visible laryngeal contours. Nearby thyroid  appears diminutive and negative. IMPRESSION: 1. Abnormal soft tissue gas partially visible in the anterior neck. If no penetrating trauma then this is suspicious for a gas forming, necrotizing soft tissue infection. Neck CT with IV contrast might evaluate further. 2. Right middle and right greater than left lower lobe bronchopneumonia/pneumonia with airway secretions and opacification. Consider Aspiration. Underlying advanced Emphysema (ICD10-J43.9). Trace if any pleural fluid. 3. Cachexia. 4. Aortic Atherosclerosis (ICD10-I70.0). Electronically Signed: By: VEAR Hurst M.D. On: 11/21/2023 09:58    ROS Blood pressure 129/78, pulse 82, temperature 97.6 F (36.4 C), temperature source Oral, resp. rate (!) 23, height 6' (1.829 m), weight 52.4 kg, SpO2 92%. Physical Exam HENT:     Right Ear: External ear normal.     Left Ear: External ear normal.  Nose: Nose normal.     Mouth/Throat:     Comments: There is some active pus coming from around the right mandibular canine/premolar.  The floor of mouth is without any swelling.  The tongue is without swelling as well as the rest of the oral cavity and oropharynx.  The patient has a normal sounding voice.  No stridor. Eyes:     Pupils: Pupils are equal, round, and reactive to light.  Neck:     Comments: There is a swelling in the right submandibular/submental area that is about 3 to 4 cm.  It is relatively soft and slightly tender to palpation. Neurological:     Mental Status: He is alert.       Assessment/Plan: Dental abscess-he has a right neck abscess with a dental issue that needs to be addressed.  I have  discussed this with Dr. Gordy Pina regarding the need for oral surgery and he is actively trying to find that care.  For now I will wait for today to see if he will be able to find that care.  If there is no availability then I may need to drain just the neck but the tooth will still need to be addressed.  Keep the patient n.p.o. for right now.  Norleen Notice 11/22/2023, 8:21 AM

## 2023-11-22 NOTE — Anesthesia Preprocedure Evaluation (Addendum)
 Anesthesia Evaluation  Patient identified by MRN, date of birth, ID band Patient confused    Reviewed: Allergy & Precautions, H&P , NPO status , Patient's Chart, lab work & pertinent test results  Airway Mallampati: II  TM Distance: >3 FB Neck ROM: Full    Dental no notable dental hx. (+) Teeth Intact, Dental Advisory Given   Pulmonary COPD,  COPD inhaler, Current Smoker and Patient abstained from smoking.   Pulmonary exam normal breath sounds clear to auscultation       Cardiovascular hypertension,  Rhythm:Regular Rate:Normal     Neuro/Psych negative neurological ROS  negative psych ROS   GI/Hepatic negative GI ROS, Neg liver ROS,,,  Endo/Other  diabetes, Well Controlled, Type 2, Oral Hypoglycemic Agents    Renal/GU negative Renal ROS  negative genitourinary   Musculoskeletal negative musculoskeletal ROS (+)    Abdominal   Peds  Hematology negative hematology ROS (+) Hb 12.3, Plt 75   Anesthesia Other Findings  admitted on 11/19/2023 with concern for odontogenic abscess with multiple large dental caries- on unasyn  LD 10am  Reproductive/Obstetrics negative OB ROS                             Anesthesia Physical Anesthesia Plan  ASA: 3  Anesthesia Plan: General   Post-op Pain Management: Tylenol  PO (pre-op)*   Induction: Intravenous  PONV Risk Score and Plan: 1 and Ondansetron , Dexamethasone  and Treatment may vary due to age or medical condition  Airway Management Planned: Oral ETT  Additional Equipment: None  Intra-op Plan:   Post-operative Plan: Extubation in OR  Informed Consent: I have reviewed the patients History and Physical, chart, labs and discussed the procedure including the risks, benefits and alternatives for the proposed anesthesia with the patient or authorized representative who has indicated his/her understanding and acceptance.     Dental advisory given  Plan  Discussed with: CRNA  Anesthesia Plan Comments:        Anesthesia Quick Evaluation

## 2023-11-22 NOTE — Anesthesia Postprocedure Evaluation (Signed)
 Anesthesia Post Note  Patient: [name]  Procedure(s) Performed: IRRIGATION AND DEBRIDEMENT NECK  ABSCESS     Patient location during evaluation: PACU Anesthesia Type: General Level of consciousness: sedated Pain management: pain level controlled Vital Signs Assessment: post-procedure vital signs reviewed and stable Respiratory status: patient on ventilator - see flowsheet for VS and respiratory function unstable Cardiovascular status: blood pressure returned to baseline and stable Postop Assessment: no apparent nausea or vomiting Anesthetic complications: no   Pt left intubated at the conclusion of the procedure. Note in the chart             Chastity Noland,W. EDMOND

## 2023-11-22 NOTE — Progress Notes (Signed)
 Dr. Craige Cotta notified of no void since arrival on unit at 1515. Bladder scan show in bladder.

## 2023-11-23 ENCOUNTER — Inpatient Hospital Stay (HOSPITAL_COMMUNITY): Payer: Medicaid Other

## 2023-11-23 ENCOUNTER — Encounter (HOSPITAL_COMMUNITY): Payer: Self-pay | Admitting: Otolaryngology

## 2023-11-23 DIAGNOSIS — Z1159 Encounter for screening for other viral diseases: Secondary | ICD-10-CM

## 2023-11-23 DIAGNOSIS — K122 Cellulitis and abscess of mouth: Secondary | ICD-10-CM | POA: Diagnosis not present

## 2023-11-23 DIAGNOSIS — F17210 Nicotine dependence, cigarettes, uncomplicated: Secondary | ICD-10-CM

## 2023-11-23 DIAGNOSIS — B952 Enterococcus as the cause of diseases classified elsewhere: Secondary | ICD-10-CM

## 2023-11-23 DIAGNOSIS — K047 Periapical abscess without sinus: Secondary | ICD-10-CM | POA: Diagnosis not present

## 2023-11-23 DIAGNOSIS — E43 Unspecified severe protein-calorie malnutrition: Secondary | ICD-10-CM

## 2023-11-23 DIAGNOSIS — J9622 Acute and chronic respiratory failure with hypercapnia: Secondary | ICD-10-CM | POA: Diagnosis not present

## 2023-11-23 DIAGNOSIS — J9621 Acute and chronic respiratory failure with hypoxia: Secondary | ICD-10-CM | POA: Diagnosis not present

## 2023-11-23 LAB — CBC
HCT: 38.1 % — ABNORMAL LOW (ref 39.0–52.0)
Hemoglobin: 11.7 g/dL — ABNORMAL LOW (ref 13.0–17.0)
MCH: 29.3 pg (ref 26.0–34.0)
MCHC: 30.7 g/dL (ref 30.0–36.0)
MCV: 95.3 fL (ref 80.0–100.0)
Platelets: 79 10*3/uL — ABNORMAL LOW (ref 150–400)
RBC: 4 MIL/uL — ABNORMAL LOW (ref 4.22–5.81)
RDW: 16 % — ABNORMAL HIGH (ref 11.5–15.5)
WBC: 20.7 10*3/uL — ABNORMAL HIGH (ref 4.0–10.5)
nRBC: 0 % (ref 0.0–0.2)

## 2023-11-23 LAB — BLOOD CULTURE ID PANEL (REFLEXED) - BCID2

## 2023-11-23 LAB — POCT I-STAT 7, (LYTES, BLD GAS, ICA,H+H)
Acid-Base Excess: 19 mmol/L — ABNORMAL HIGH (ref 0.0–2.0)
Acid-Base Excess: 19 mmol/L — ABNORMAL HIGH (ref 0.0–2.0)
Bicarbonate: 48.1 mmol/L — ABNORMAL HIGH (ref 20.0–28.0)
Bicarbonate: 47.6 mmol/L — ABNORMAL HIGH (ref 20.0–28.0)
Calcium, Ion: 1.11 mmol/L — ABNORMAL LOW (ref 1.15–1.40)
Calcium, Ion: 1.1 mmol/L — ABNORMAL LOW (ref 1.15–1.40)
HCT: 38 % — ABNORMAL LOW (ref 39.0–52.0)
HCT: 38 % — ABNORMAL LOW (ref 39.0–52.0)
Hemoglobin: 12.9 g/dL — ABNORMAL LOW (ref 13.0–17.0)
Hemoglobin: 12.9 g/dL — ABNORMAL LOW (ref 13.0–17.0)
O2 Saturation: 93 %
O2 Saturation: 92 %
Patient temperature: 36.4
Patient temperature: 36.2
Potassium: 4.1 mmol/L (ref 3.5–5.1)
Potassium: 4 mmol/L (ref 3.5–5.1)
Sodium: 148 mmol/L — ABNORMAL HIGH (ref 135–145)
Sodium: 149 mmol/L — ABNORMAL HIGH (ref 135–145)
TCO2: >50 mmol/L — ABNORMAL HIGH (ref 22–32)
TCO2: 50 mmol/L — ABNORMAL HIGH (ref 22–32)
pCO2 arterial: 79.4 mm[Hg] (ref 32–48)
pCO2 arterial: 69.1 mm[Hg] (ref 32–48)
pH, Arterial: 7.388 (ref 7.35–7.45)
pH, Arterial: 7.443 (ref 7.35–7.45)
pO2, Arterial: 71 mm[Hg] — ABNORMAL LOW (ref 83–108)
pO2, Arterial: 61 mm[Hg] — ABNORMAL LOW (ref 83–108)

## 2023-11-23 LAB — GLUCOSE, CAPILLARY
Glucose-Capillary: 221 mg/dL — ABNORMAL HIGH (ref 70–99)
Glucose-Capillary: 226 mg/dL — ABNORMAL HIGH (ref 70–99)
Glucose-Capillary: 259 mg/dL — ABNORMAL HIGH (ref 70–99)
Glucose-Capillary: 207 mg/dL — ABNORMAL HIGH (ref 70–99)
Glucose-Capillary: 161 mg/dL — ABNORMAL HIGH (ref 70–99)
Glucose-Capillary: 151 mg/dL — ABNORMAL HIGH (ref 70–99)

## 2023-11-23 LAB — RAPID URINE DRUG SCREEN, HOSP PERFORMED
Amphetamines: NOT DETECTED
Barbiturates: NOT DETECTED
Benzodiazepines: NOT DETECTED
Cocaine: NOT DETECTED
Opiates: NOT DETECTED
Tetrahydrocannabinol: NOT DETECTED

## 2023-11-23 LAB — EXPECTORATED SPUTUM ASSESSMENT W GRAM STAIN, RFLX TO RESP C: Special Requests: NORMAL

## 2023-11-23 LAB — COMPREHENSIVE METABOLIC PANEL
ALT: 83 U/L — ABNORMAL HIGH (ref 0–44)
AST: 40 U/L (ref 15–41)
Albumin: 2.5 g/dL — ABNORMAL LOW (ref 3.5–5.0)
Alkaline Phosphatase: 112 U/L (ref 38–126)
Anion gap: 8 (ref 5–15)
BUN: 47 mg/dL — ABNORMAL HIGH (ref 8–23)
CO2: 42 mmol/L — ABNORMAL HIGH (ref 22–32)
Calcium: 7.7 mg/dL — ABNORMAL LOW (ref 8.9–10.3)
Chloride: 98 mmol/L (ref 98–111)
Creatinine, Ser: 0.55 mg/dL — ABNORMAL LOW (ref 0.61–1.24)
GFR, Estimated: >60 mL/min (ref >60)
Glucose, Bld: 258 mg/dL — ABNORMAL HIGH (ref 70–99)
Potassium: 4.3 mmol/L (ref 3.5–5.1)
Sodium: 148 mmol/L — ABNORMAL HIGH (ref 135–145)
Total Bilirubin: 0.9 mg/dL (ref 0.0–1.2)
Total Protein: 5 g/dL — ABNORMAL LOW (ref 6.5–8.1)

## 2023-11-23 LAB — PHOSPHORUS
Phosphorus: 4.5 mg/dL (ref 2.5–4.6)
Phosphorus: 4.1 mg/dL (ref 2.5–4.6)

## 2023-11-23 LAB — STREP PNEUMONIAE URINARY ANTIGEN: Strep Pneumo Urinary Antigen: POSITIVE — AB

## 2023-11-23 LAB — TROPONIN I (HIGH SENSITIVITY)
Troponin I (High Sensitivity): 34 ng/L — ABNORMAL HIGH (ref <18)
Troponin I (High Sensitivity): 38 ng/L — ABNORMAL HIGH (ref <18)

## 2023-11-23 LAB — MAGNESIUM
Magnesium: 2 mg/dL (ref 1.7–2.4)
Magnesium: 2.6 mg/dL — ABNORMAL HIGH (ref 1.7–2.4)

## 2023-11-23 LAB — TRIGLYCERIDES: Triglycerides: 64 mg/dL (ref <150)

## 2023-11-23 MED ORDER — POTASSIUM PHOSPHATES 15 MMOLE/5ML IV SOLN
15.0000 mmol | Freq: Once | INTRAVENOUS | Status: AC
  Administered 2023-11-23: 15 mmol via INTRAVENOUS
  Filled 2023-11-23: qty 5

## 2023-11-23 MED ORDER — OSMOLITE 1.5 CAL PO LIQD
1000.0000 mL | ORAL | Status: DC
  Administered 2023-11-23 – 2023-11-26 (×5): 1000 mL
  Filled 2023-11-23 (×2): qty 1000

## 2023-11-23 MED ORDER — FENTANYL BOLUS VIA INFUSION
25.0000 ug | INTRAVENOUS | Status: DC | PRN
  Administered 2023-11-23 (×2): 25 ug via INTRAVENOUS
  Administered 2023-11-23: 100 ug via INTRAVENOUS
  Administered 2023-11-24 (×3): 50 ug via INTRAVENOUS
  Administered 2023-11-24: 25 ug via INTRAVENOUS
  Administered 2023-11-24: 100 ug via INTRAVENOUS
  Administered 2023-11-24: 50 ug via INTRAVENOUS
  Administered 2023-11-24: 100 ug via INTRAVENOUS
  Administered 2023-11-25 – 2023-11-26 (×7): 50 ug via INTRAVENOUS
  Administered 2023-11-26: 100 ug via INTRAVENOUS
  Administered 2023-11-27: 75 ug via INTRAVENOUS

## 2023-11-23 MED ORDER — INSULIN ASPART 100 UNIT/ML IJ SOLN: 3.0000 [IU] | INTRAMUSCULAR | Status: DC

## 2023-11-23 MED ORDER — IOHEXOL 350 MG/ML SOLN
75.0000 mL | Freq: Once | INTRAVENOUS | Status: AC | PRN
  Administered 2023-11-23: 75 mL via INTRAVENOUS

## 2023-11-23 MED ORDER — VANCOMYCIN HCL 750 MG/150ML IV SOLN: 750.0000 mg | Freq: Two times a day (BID) | INTRAVENOUS | Status: DC

## 2023-11-23 MED ORDER — VANCOMYCIN HCL IN DEXTROSE 1-5 GM/200ML-% IV SOLN: 1000.0000 mg | Freq: Once | INTRAVENOUS | Status: DC

## 2023-11-23 MED ORDER — MAGNESIUM SULFATE 4 GM/100ML IV SOLN
4.0000 g | Freq: Once | INTRAVENOUS | Status: AC
  Administered 2023-11-23: 4 g via INTRAVENOUS
  Filled 2023-11-23: qty 100

## 2023-11-23 MED ORDER — MAGNESIUM SULFATE 2 GM/50ML IV SOLN
2.0000 g | Freq: Once | INTRAVENOUS | Status: AC
  Administered 2023-11-23: 2 g via INTRAVENOUS
  Filled 2023-11-23: qty 50

## 2023-11-23 MED ORDER — IPRATROPIUM-ALBUTEROL 0.5-2.5 (3) MG/3ML IN SOLN: 3.0000 mL | RESPIRATORY_TRACT | Status: DC

## 2023-11-23 MED ORDER — INSULIN ASPART 100 UNIT/ML IJ SOLN
0.0000 [IU] | INTRAMUSCULAR | Status: DC
  Administered 2023-11-23: 2 [IU] via SUBCUTANEOUS
  Administered 2023-11-23: 3 [IU] via SUBCUTANEOUS
  Administered 2023-11-23: 5 [IU] via SUBCUTANEOUS
  Administered 2023-11-23: 2 [IU] via SUBCUTANEOUS
  Administered 2023-11-24 (×2): 1 [IU] via SUBCUTANEOUS
  Administered 2023-11-24: 2 [IU] via SUBCUTANEOUS
  Administered 2023-11-24: 1 [IU] via SUBCUTANEOUS
  Administered 2023-11-25 (×2): 3 [IU] via SUBCUTANEOUS

## 2023-11-23 MED ORDER — BUDESONIDE 0.5 MG/2ML IN SUSP
0.5000 mg | Freq: Two times a day (BID) | RESPIRATORY_TRACT | Status: DC
  Administered 2023-11-23 – 2023-11-27 (×9): 0.5 mg via RESPIRATORY_TRACT
  Filled 2023-11-23 (×9): qty 2

## 2023-11-23 NOTE — Progress Notes (Signed)
 Pharmacy Electrolyte Replacement  Recent Labs:  Recent Labs    11/22/23 0220 11/22/23 1556 11/22/23 1559 11/22/23 1712  K 4.1   < >  --  3.6  MG 1.8  --  1.3*  --   PHOS  --   --  2.3*  --   CREATININE 0.31*  --   --   --    < > = values in this interval not displayed.    Low Critical Values (K </= 2.5, Phos </= 1, Mg </= 1) Present: None  MD Contacted: n/a  Plan: No replacement since 1559 labs - giving Mag 6g total and KPhos 15 mmol x1.  F/up repeat labs in PM.  Harlene Boga, PharmD, BCPS, BCCCP Please refer to [facility] for [facility] Pharmacy numbers 11/23/2023, 7:20 AM

## 2023-11-23 NOTE — Progress Notes (Signed)
 Patient ID: [name], male   DOB: 1961/07/02, 63 y.o.   MRN: [id number]    He is still intubated. This was bc of sedation not airway swelling.   The wound looks goood and drain in place. No mouth swelling  He can be extubated from my standpoint. Will take drain out tomorrow, still need dental care

## 2023-11-23 NOTE — Plan of Care (Signed)
  Problem: Fluid Volume: Goal: Hemodynamic stability will improve Outcome: Not Progressing   Problem: Respiratory: Goal: Ability to maintain adequate ventilation will improve Outcome: Not Progressing   Problem: Respiratory: Goal: Ability to maintain adequate ventilation will improve Outcome: Not Progressing

## 2023-11-23 NOTE — Progress Notes (Signed)
 PT Cancellation Note  Patient Details Name: [name] MRN: [id number] DOB: [birth year]   Cancelled Treatment:    Reason Eval/Treat Not Completed: Patient not medically ready  Remains intubated after I&D of right neck abscess yesterday. Spoke with RN. Will hold PT follow-up today and attempt tomorrow as schedule permits and patient is able.  Leontine Roads, PT, DPT [facility]  [facility] Services Physical Therapist Office: [phone] Website: [email or web address]  Leontine GORMAN Roads 11/23/2023, 12:56 PM

## 2023-11-23 NOTE — Plan of Care (Signed)
 Problem: Education: Goal: Ability to describe self-care measures that may prevent or decrease complications (Diabetes Survival Skills Education) will improve Outcome: Progressing Goal: Individualized Educational Video(s) Outcome: Progressing   Problem: Coping: Goal: Ability to adjust to condition or change in health will improve Outcome: Progressing   Problem: Fluid Volume: Goal: Ability to maintain a balanced intake and output will improve 11/23/2023 0004 by Tommas Asberry SAUNDERS, RN Outcome: Progressing 11/22/2023 2237 by Tommas Asberry SAUNDERS, RN Outcome: Progressing   Problem: Health Behavior/Discharge Planning: Goal: Ability to identify and utilize available resources and services will improve 11/23/2023 0004 by Tommas Asberry SAUNDERS, RN Outcome: Progressing 11/22/2023 2237 by Tommas Asberry SAUNDERS, RN Outcome: Progressing Goal: Ability to manage health-related needs will improve 11/23/2023 0004 by Tommas Asberry SAUNDERS, RN Outcome: Progressing 11/22/2023 2237 by Tommas Asberry SAUNDERS, RN Outcome: Progressing   Problem: Metabolic: Goal: Ability to maintain appropriate glucose levels will improve 11/23/2023 0004 by Tommas Asberry SAUNDERS, RN Outcome: Progressing 11/22/2023 2237 by Tommas Asberry SAUNDERS, RN Outcome: Progressing   Problem: Nutritional: Goal: Maintenance of adequate nutrition will improve 11/23/2023 0004 by Tommas Asberry SAUNDERS, RN Outcome: Progressing 11/22/2023 2237 by Tommas Asberry SAUNDERS, RN Outcome: Progressing Goal: Progress toward achieving an optimal weight will improve 11/23/2023 0004 by Tommas Asberry SAUNDERS, RN Outcome: Progressing 11/22/2023 2237 by Tommas Asberry SAUNDERS, RN Outcome: Progressing   Problem: Skin Integrity: Goal: Risk for impaired skin integrity will decrease 11/23/2023 0004 by Tommas Asberry SAUNDERS, RN Outcome: Progressing 11/22/2023 2237 by Tommas Asberry SAUNDERS, RN Outcome: Progressing   Problem: Tissue Perfusion: Goal: Adequacy of tissue perfusion will  improve 11/23/2023 0004 by Tommas Asberry SAUNDERS, RN Outcome: Progressing 11/22/2023 2237 by Tommas Asberry SAUNDERS, RN Outcome: Progressing   Problem: Fluid Volume: Goal: Hemodynamic stability will improve 11/23/2023 0004 by Tommas Asberry SAUNDERS, RN Outcome: Progressing 11/22/2023 2237 by Tommas Asberry SAUNDERS, RN Outcome: Progressing   Problem: Clinical Measurements: Goal: Diagnostic test results will improve 11/23/2023 0004 by Tommas Asberry SAUNDERS, RN Outcome: Progressing 11/22/2023 2237 by Tommas Asberry SAUNDERS, RN Outcome: Progressing Goal: Signs and symptoms of infection will decrease 11/23/2023 0004 by Tommas Asberry SAUNDERS, RN Outcome: Progressing 11/22/2023 2237 by Tommas Asberry SAUNDERS, RN Outcome: Progressing   Problem: Respiratory: Goal: Ability to maintain adequate ventilation will improve 11/23/2023 0004 by Tommas Asberry SAUNDERS, RN Outcome: Progressing 11/22/2023 2237 by Tommas Asberry SAUNDERS, RN Outcome: Progressing   Problem: Education: Goal: Knowledge of disease or condition will improve Outcome: Progressing Goal: Knowledge of the prescribed therapeutic regimen will improve Outcome: Progressing Goal: Individualized Educational Video(s) Outcome: Progressing   Problem: Activity: Goal: Ability to tolerate increased activity will improve Outcome: Progressing Goal: Will verbalize the importance of balancing activity with adequate rest periods Outcome: Progressing   Problem: Respiratory: Goal: Ability to maintain a clear airway will improve 11/23/2023 0004 by Tommas Asberry SAUNDERS, RN Outcome: Progressing 11/22/2023 2237 by Tommas Asberry SAUNDERS, RN Outcome: Progressing Goal: Levels of oxygenation will improve 11/23/2023 0004 by Tommas Asberry SAUNDERS, RN Outcome: Progressing 11/22/2023 2237 by Tommas Asberry SAUNDERS, RN Outcome: Progressing Goal: Ability to maintain adequate ventilation will improve 11/23/2023 0004 by Tommas Asberry SAUNDERS, RN Outcome: Progressing 11/22/2023 2237 by Tommas Asberry SAUNDERS, RN Outcome: Progressing   Problem: Education: Goal: Knowledge of General Education information will improve Description: Including pain rating scale, medication(s)/side effects and non-pharmacologic comfort measures Outcome: Progressing   Problem: Health Behavior/Discharge Planning: Goal: Ability to manage health-related needs will improve Outcome: Progressing   Problem: Clinical Measurements: Goal: Ability to maintain clinical measurements within normal limits  will improve 11/23/2023 0004 by Tommas Asberry SAUNDERS, RN Outcome: Progressing 11/22/2023 2237 by Tommas Asberry SAUNDERS, RN Outcome: Progressing Goal: Will remain free from infection 11/23/2023 0004 by Tommas Asberry SAUNDERS, RN Outcome: Progressing 11/22/2023 2237 by Tommas Asberry SAUNDERS, RN Outcome: Progressing Goal: Diagnostic test results will improve 11/23/2023 0004 by Tommas Asberry SAUNDERS, RN Outcome: Progressing 11/22/2023 2237 by Tommas Asberry SAUNDERS, RN Outcome: Progressing Goal: Respiratory complications will improve 11/23/2023 0004 by Tommas Asberry SAUNDERS, RN Outcome: Progressing 11/22/2023 2237 by Tommas Asberry SAUNDERS, RN Outcome: Progressing Goal: Cardiovascular complication will be avoided 11/23/2023 0004 by Tommas Asberry SAUNDERS, RN Outcome: Progressing 11/22/2023 2237 by Tommas Asberry SAUNDERS, RN Outcome: Progressing   Problem: Activity: Goal: Risk for activity intolerance will decrease 11/23/2023 0004 by Tommas Asberry SAUNDERS, RN Outcome: Progressing 11/22/2023 2237 by Tommas Asberry SAUNDERS, RN Outcome: Progressing   Problem: Nutrition: Goal: Adequate nutrition will be maintained 11/23/2023 0004 by Tommas Asberry SAUNDERS, RN Outcome: Progressing 11/22/2023 2237 by Tommas Asberry SAUNDERS, RN Outcome: Progressing   Problem: Coping: Goal: Level of anxiety will decrease 11/23/2023 0004 by Tommas Asberry SAUNDERS, RN Outcome: Progressing 11/22/2023 2237 by Tommas Asberry SAUNDERS, RN Outcome: Progressing   Problem: Elimination: Goal:  Will not experience complications related to bowel motility 11/23/2023 0004 by Tommas Asberry SAUNDERS, RN Outcome: Progressing 11/22/2023 2237 by Tommas Asberry SAUNDERS, RN Outcome: Progressing Goal: Will not experience complications related to urinary retention 11/23/2023 0004 by Tommas Asberry SAUNDERS, RN Outcome: Progressing 11/22/2023 2237 by Tommas Asberry SAUNDERS, RN Outcome: Progressing   Problem: Pain Management: Goal: General experience of comfort will improve 11/23/2023 0004 by Tommas Asberry SAUNDERS, RN Outcome: Progressing 11/22/2023 2237 by Tommas Asberry SAUNDERS, RN Outcome: Progressing   Problem: Safety: Goal: Ability to remain free from injury will improve 11/23/2023 0004 by Tommas Asberry SAUNDERS, RN Outcome: Progressing 11/22/2023 2237 by Tommas Asberry SAUNDERS, RN Outcome: Progressing   Problem: Skin Integrity: Goal: Risk for impaired skin integrity will decrease 11/23/2023 0004 by Tommas Asberry SAUNDERS, RN Outcome: Progressing 11/22/2023 2237 by Tommas Asberry SAUNDERS, RN Outcome: Progressing   Problem: Fluid Volume: Goal: Hemodynamic stability will improve 11/23/2023 0004 by Tommas Asberry SAUNDERS, RN Outcome: Progressing 11/22/2023 2237 by Tommas Asberry SAUNDERS, RN Outcome: Progressing   Problem: Clinical Measurements: Goal: Signs and symptoms of infection will decrease 11/23/2023 0004 by Tommas Asberry SAUNDERS, RN Outcome: Progressing 11/22/2023 2237 by Tommas Asberry SAUNDERS, RN Outcome: Progressing   Problem: Respiratory: Goal: Ability to maintain adequate ventilation will improve 11/23/2023 0004 by Tommas Asberry SAUNDERS, RN Outcome: Progressing 11/22/2023 2237 by Tommas Asberry SAUNDERS, RN Outcome: Progressing

## 2023-11-23 NOTE — Progress Notes (Signed)
 ABG collected with critical values resulted, MD made aware. Vent changes made as charted.

## 2023-11-23 NOTE — Progress Notes (Signed)
 Abg collected, critical values resulted and MD made aware. No new orders at this time.

## 2023-11-23 NOTE — Plan of Care (Signed)
  Problem: Fluid Volume: Goal: Ability to maintain a balanced intake and output will improve Outcome: Progressing   Problem: Health Behavior/Discharge Planning: Goal: Ability to identify and utilize available resources and services will improve Outcome: Progressing Goal: Ability to manage health-related needs will improve Outcome: Progressing   Problem: Metabolic: Goal: Ability to maintain appropriate glucose levels will improve Outcome: Progressing   Problem: Nutritional: Goal: Maintenance of adequate nutrition will improve Outcome: Progressing Goal: Progress toward achieving an optimal weight will improve Outcome: Progressing   Problem: Skin Integrity: Goal: Risk for impaired skin integrity will decrease Outcome: Progressing   Problem: Tissue Perfusion: Goal: Adequacy of tissue perfusion will improve Outcome: Progressing   Problem: Fluid Volume: Goal: Hemodynamic stability will improve Outcome: Progressing   Problem: Clinical Measurements: Goal: Diagnostic test results will improve Outcome: Progressing Goal: Signs and symptoms of infection will decrease Outcome: Progressing   Problem: Respiratory: Goal: Ability to maintain adequate ventilation will improve Outcome: Progressing   Problem: Activity: Goal: Ability to tolerate increased activity will improve Outcome: Progressing Goal: Will verbalize the importance of balancing activity with adequate rest periods Outcome: Progressing   Problem: Respiratory: Goal: Ability to maintain a clear airway will improve Outcome: Progressing Goal: Levels of oxygenation will improve Outcome: Progressing Goal: Ability to maintain adequate ventilation will improve Outcome: Progressing   Problem: Clinical Measurements: Goal: Ability to maintain clinical measurements within normal limits will improve Outcome: Progressing Goal: Will remain free from infection Outcome: Progressing Goal: Diagnostic test results will  improve Outcome: Progressing Goal: Respiratory complications will improve Outcome: Progressing Goal: Cardiovascular complication will be avoided Outcome: Progressing   Problem: Activity: Goal: Risk for activity intolerance will decrease Outcome: Progressing   Problem: Nutrition: Goal: Adequate nutrition will be maintained Outcome: Progressing   Problem: Coping: Goal: Level of anxiety will decrease Outcome: Progressing   Problem: Elimination: Goal: Will not experience complications related to bowel motility Outcome: Progressing Goal: Will not experience complications related to urinary retention Outcome: Progressing   Problem: Pain Management: Goal: General experience of comfort will improve Outcome: Progressing   Problem: Safety: Goal: Ability to remain free from injury will improve Outcome: Progressing   Problem: Skin Integrity: Goal: Risk for impaired skin integrity will decrease Outcome: Progressing   Problem: Fluid Volume: Goal: Hemodynamic stability will improve Outcome: Progressing   Problem: Clinical Measurements: Goal: Signs and symptoms of infection will decrease Outcome: Progressing   Problem: Respiratory: Goal: Ability to maintain adequate ventilation will improve Outcome: Progressing

## 2023-11-23 NOTE — Consult Note (Signed)
 [facility]    Date of Admission:  11/19/2023     Total days of antibiotics                Reason for Consult: Enterococcal bacteremia   Referring Provider: Sharyon / [employer]   Primary Care Provider: Vicci Barnie NOVAK, MD   ASSESSMENT:  [name] is a 63 y/o caucasian male admitted with hypoxia and initial concern for COPD exacerbation/Pneumonia and found to have a multiloculated odontogenic abscess around the submandibular space with blood cultures positive for Enterococcus faecalis. Surgical specimen with gram stain showing gram positive cocci which would anticipate be Enterococcus. Continue current dose of ampicillin -sulbactam while monitoring cultures which will also cover pneumonia. Can discontinue vancomycin  and clindamycin  (no evidence of necrotizing process noted).  Remains intubated and vasopressor support. Will check TTE for endocarditis and obtain blood cultures to ensure clearance of bacteremia. Agree will need dental evaluation to resolve infection as this is likely source of infection. Remaining medical and supportive care per PCCM.   PLAN:  Continue current dose of ampicillin -sulbactam.  Discontinue vancomycin  and clindamycin . Monitor surgical cultures for organism identification.  Blood cultures to ensure clearance of bacteremia.  Check TTE for endocarditis.  Dental evaluation when able. Remaining medical and supportive care per PCCM.    Principal Problem:   CAP (community acquired pneumonia) - [facility] Active Problems:   TOBACCO ABUSE   COPD with acute exacerbation (HCC)   Chronic pancreatitis, unspecified pancreatitis type (HCC)   Alcohol  abuse   Type 2 diabetes mellitus with hyperglycemia (HCC)   Acute on chronic respiratory failure with hypoxia and hypercapnia (HCC)   Sepsis with acute organ dysfunction without septic shock (HCC)   Hyperkalemia   Elevated LFTs   Elevated INR   Protein-calorie malnutrition, severe    Submandibular abscess - right side    arformoterol   15 mcg Nebulization BID   budesonide  (PULMICORT ) nebulizer solution  0.5 mg Nebulization BID   Chlorhexidine  Gluconate Cloth  6 each Topical Daily   docusate  100 mg Per Tube BID   feeding supplement (PROSource TF20)  60 mL Per Tube Daily   fentaNYL  (SUBLIMAZE ) injection  50 mcg Intravenous Once   insulin  aspart  0-9 Units Subcutaneous Q4H   lactulose   20 g Per Tube BID   nicotine   14 mg Transdermal Daily   mouth rinse  15 mL Mouth Rinse Q2H   pantoprazole  (PROTONIX ) IV  40 mg Intravenous QHS   polyethylene glycol  17 g Per Tube Daily   revefenacin   175 mcg Nebulization Daily   [START ON 11/25/2023] thiamine  (VITAMIN B1) injection  100 mg Intravenous Q24H     HPI: [name] is a 63 y.o. male with previous medical history of COPD, diabetes, and pancreatitis presenting to the ED with hypoxia and worsening COPD.   [name] presented with cough, congestion and shortness of breath for a few days prior to presentation. Found to be hypoxic and hypothermic with leukocytosis with WBC count 24,700. Initial concerns for COPD vs Pneumonia. CT chest with soft tissue gas partially visible in the anterior neck; and right middle and right greater than left lower lobe bronchopneumonia/pneumonia. CT soft tissue neck with large multiloculated odontogenic abscess around the right submandibular space. Intubated for airway protection. Initially received ceftriaxone  and azithromycin  followed by ampicillin -sulbactam. ENT incised and drained the right neck abscess with indication for oral surgery to take care of dental issue. Cultures obtained have gram positive cocci  in pairs on gram stain with culture reincubated for better growth. Blood cultures are positive for Enterococcus faecalis.   [name] remains intubated and sedated and is unable to provide any significant history. The nursing staff adds per family that he has been sick and had many  dental issues that he has not been able to resolve.   Review of Systems: Review of Systems  Unable to perform ROS: Intubated     Past Medical History:  Diagnosis Date   COPD (chronic obstructive pulmonary disease) (HCC)    COVID-19 vaccine series completed 12/09/2020   Diabetes mellitus without complication (HCC)    Pancreatitis    PANCREATITIS, ACUTE 10/19/2010   Qualifier: Diagnosis of   By: Gladis FNP, Delorise          Social History   Tobacco Use   Smoking status: Every Day    Current packs/day: 1.00    Average packs/day: 1 pack/day for 35.0 years (35.0 ttl pk-yrs)    Types: Cigarettes   Smokeless tobacco: Never   Tobacco comments:    Smokes 7 packs of cigarettes in a week. 07/09/2023 Tay  Substance Use Topics   Alcohol  use: Yes    Comment: occassionally    Drug use: No    Family History  Problem Relation Age of Onset   Diabetes Sister     No Known Allergies  OBJECTIVE: Blood pressure (!) 86/64, pulse 84, temperature (!) 97.2 F (36.2 C), temperature source Bladder, resp. rate (!) 8, height 6' (1.829 m), weight 52.9 kg, SpO2 (!) 87%.  Physical Exam Constitutional:      General: He is not in acute distress.    Appearance: He is cachectic. He is ill-appearing and toxic-appearing.     Interventions: He is sedated and restrained.  Cardiovascular:     Rate and Rhythm: Normal rate and regular rhythm.     Heart sounds: Normal heart sounds.  Pulmonary:     Effort: Pulmonary effort is normal.     Breath sounds: Rhonchi present.  Skin:    General: Skin is warm and dry.     Lab Results Lab Results  Component Value Date   WBC 20.7 (H) 11/23/2023   HGB 12.9 (L) 11/23/2023   HCT 38.0 (L) 11/23/2023   MCV 95.3 11/23/2023   PLT 79 (L) 11/23/2023    Lab Results  Component Value Date   CREATININE 0.55 (L) 11/23/2023   BUN 47 (H) 11/23/2023   NA 149 (H) 11/23/2023   K 4.0 11/23/2023   CL 98 11/23/2023   CO2 42 (H) 11/23/2023    Lab Results  Component  Value Date   ALT 83 (H) 11/23/2023   AST 40 11/23/2023   ALKPHOS 112 11/23/2023   BILITOT 0.9 11/23/2023     Microbiology: Recent Results (from the past 240 hours)  Blood culture (routine x 2)     Status: None (Preliminary result)   Collection Time: 11/19/23 10:50 PM   Specimen: BLOOD RIGHT FOREARM  Result Value Ref Range Status   Specimen Description BLOOD RIGHT FOREARM  Final   Special Requests   Final    BOTTLES DRAWN AEROBIC AND ANAEROBIC Blood Culture results may not be optimal due to an inadequate volume of blood received in culture bottles   Culture   Final    NO GROWTH 4 DAYS Performed at [facility], [address], [place], [place] [address]    Report Status PENDING  Incomplete  Blood culture (routine x 2)  Status: None (Preliminary result)   Collection Time: 11/19/23 11:00 PM   Specimen: BLOOD  Result Value Ref Range Status   Specimen Description BLOOD RIGHT ANTECUBITAL  Final   Special Requests   Final    BOTTLES DRAWN AEROBIC AND ANAEROBIC Blood Culture results may not be optimal due to an inadequate volume of blood received in culture bottles   Culture   Final    NO GROWTH 4 DAYS Performed at [facility], [address] [place], [place] [address]    Report Status PENDING  Incomplete  Resp panel by RT-PCR (RSV, Flu A&B, Covid) Anterior Nasal Swab     Status: None   Collection Time: 11/19/23 11:10 PM   Specimen: Anterior Nasal Swab  Result Value Ref Range Status   SARS Coronavirus 2 by RT PCR NEGATIVE NEGATIVE Final   Influenza A by PCR NEGATIVE NEGATIVE Final   Influenza B by PCR NEGATIVE NEGATIVE Final    Comment: (NOTE) The Xpert Xpress SARS-CoV-2/FLU/RSV plus assay is intended as an aid in the diagnosis of influenza from Nasopharyngeal swab specimens and should not be used as a sole basis for treatment. Nasal washings and aspirates are unacceptable for Xpert Xpress SARS-CoV-2/FLU/RSV testing.  Fact Sheet for  Patients: bloggercourse.com  Fact Sheet for Healthcare Providers: seriousbroker.it  This test is not yet approved or cleared by the [place]  FDA and has been authorized for detection and/or diagnosis of SARS-CoV-2 by FDA under an Emergency Use Authorization (EUA). This EUA will remain in effect (meaning this test can be used) for the duration of the COVID-19 declaration under Section 564(b)(1) of the Act, 21 U.S.C. section 360bbb-3(b)(1), unless the authorization is terminated or revoked.     Resp Syncytial Virus by PCR NEGATIVE NEGATIVE Final    Comment: (NOTE) Fact Sheet for Patients: bloggercourse.com  Fact Sheet for Healthcare Providers: seriousbroker.it  This test is not yet approved or cleared by the [place]  FDA and has been authorized for detection and/or diagnosis of SARS-CoV-2 by FDA under an Emergency Use Authorization (EUA). This EUA will remain in effect (meaning this test can be used) for the duration of the COVID-19 declaration under Section 564(b)(1) of the Act, 21 U.S.C. section 360bbb-3(b)(1), unless the authorization is terminated or revoked.  Performed at [facility], [address], [place], [place] [address]   Culture, blood (routine x 2) Call MD if unable to obtain prior to antibiotics being given     Status: None (Preliminary result)   Collection Time: 11/20/23  1:40 AM   Specimen: BLOOD  Result Value Ref Range Status   Specimen Description BLOOD RIGHT ANTECUBITAL  Final   Special Requests   Final    BOTTLES DRAWN AEROBIC AND ANAEROBIC Blood Culture results may not be optimal due to an inadequate volume of blood received in culture bottles   Culture  Setup Time   Final    GRAM POSITIVE COCCI IN CHAINS AEROBIC BOTTLE ONLY CRITICAL RESULT CALLED TO, READ BACK BY AND VERIFIED WITH: MAYA HARBOUR, A [id number] FCP Performed at [facility], [address] [place], [place] [address]    Culture [facility] POSITIVE COCCI  Final   Report Status PENDING  Incomplete  Blood Culture ID Panel (Reflexed)     Status: Abnormal   Collection Time: 11/20/23  1:40 AM  Result Value Ref Range Status   Enterococcus faecalis DETECTED (A) NOT DETECTED Final   Enterococcus Faecium NOT DETECTED NOT DETECTED Final  Comment: CRITICAL RESULT CALLED TO, READ BACK BY AND VERIFIED WITH: PHARMD MYER, A [id number] FCP    Listeria monocytogenes NOT DETECTED NOT DETECTED Final   Staphylococcus species NOT DETECTED NOT DETECTED Final   Staphylococcus aureus (BCID) NOT DETECTED NOT DETECTED Final   Staphylococcus epidermidis NOT DETECTED NOT DETECTED Final   Staphylococcus lugdunensis NOT DETECTED NOT DETECTED Final   Streptococcus species NOT DETECTED NOT DETECTED Final   Streptococcus agalactiae NOT DETECTED NOT DETECTED Final   Streptococcus pneumoniae NOT DETECTED NOT DETECTED Final   Streptococcus pyogenes NOT DETECTED NOT DETECTED Final   A.calcoaceticus-baumannii NOT DETECTED NOT DETECTED Final   Bacteroides fragilis NOT DETECTED NOT DETECTED Final   Enterobacterales NOT DETECTED NOT DETECTED Final   Enterobacter cloacae complex NOT DETECTED NOT DETECTED Final   Escherichia coli NOT DETECTED NOT DETECTED Final   Klebsiella aerogenes NOT DETECTED NOT DETECTED Final   Klebsiella oxytoca NOT DETECTED NOT DETECTED Final   Klebsiella pneumoniae NOT DETECTED NOT DETECTED Final   Proteus species NOT DETECTED NOT DETECTED Final   Salmonella species NOT DETECTED NOT DETECTED Final   Serratia marcescens NOT DETECTED NOT DETECTED Final   Haemophilus influenzae NOT DETECTED NOT DETECTED Final   Neisseria meningitidis NOT DETECTED NOT DETECTED Final   Pseudomonas aeruginosa NOT DETECTED NOT DETECTED Final   Stenotrophomonas maltophilia NOT DETECTED NOT DETECTED Final   Candida albicans NOT DETECTED NOT DETECTED Final   Candida auris NOT DETECTED NOT  DETECTED Final   Candida glabrata NOT DETECTED NOT DETECTED Final   Candida krusei NOT DETECTED NOT DETECTED Final   Candida parapsilosis NOT DETECTED NOT DETECTED Final   Candida tropicalis NOT DETECTED NOT DETECTED Final   Cryptococcus neoformans/gattii NOT DETECTED NOT DETECTED Final   Vancomycin  resistance NOT DETECTED NOT DETECTED Final    Comment: Performed at [facility], [address], [place], [place] [address]  Culture, blood (routine x 2) Call MD if unable to obtain prior to antibiotics being given     Status: None (Preliminary result)   Collection Time: 11/20/23  1:55 AM   Specimen: BLOOD  Result Value Ref Range Status   Specimen Description BLOOD LEFT ANTECUBITAL  Final   Special Requests   Final    BOTTLES DRAWN AEROBIC AND ANAEROBIC Blood Culture results may not be optimal due to an inadequate volume of blood received in culture bottles   Culture   Final    NO GROWTH 3 DAYS Performed at [facility], [address] [place], [place] [address]    Report Status PENDING  Incomplete  MRSA Next Gen by PCR, Nasal     Status: None   Collection Time: 11/20/23  7:00 AM   Specimen: Nasal Mucosa; Nasal Swab  Result Value Ref Range Status   MRSA by PCR Next Gen NOT DETECTED NOT DETECTED Final    Comment: (NOTE) The GeneXpert MRSA Assay (FDA approved for NASAL specimens only), is one component of a comprehensive MRSA colonization surveillance program. It is not intended to diagnose MRSA infection nor to guide or monitor treatment for MRSA infections. Test performance is not FDA approved in patients less than 27 years old. Performed at [facility], [address], [place], [place] [address]   Aerobic/Anaerobic Culture w Gram Stain (surgical/deep wound)     Status: None (Preliminary result)   Collection Time: 11/22/23 12:04 PM   Specimen: Abscess  Result Value Ref Range Status   Specimen Description ABSCESS  Final  Special Requests NECK ABSCESS  Final    Gram Stain   Final    ABUNDANT WBC PRESENT, PREDOMINANTLY PMN FEW GRAM POSITIVE COCCI IN PAIRS    Culture   Final    CULTURE REINCUBATED FOR BETTER GROWTH Performed at [facility], [address] [place], [place] [address]    Report Status PENDING  Incomplete  Expectorated Sputum Assessment w Gram Stain, Rflx to Resp Cult     Status: None   Collection Time: 11/23/23  2:25 AM   Specimen: Expectorated Sputum  Result Value Ref Range Status   Specimen Description EXPECTORATED SPUTUM  Final   Special Requests Normal  Final   Sputum evaluation   Final    THIS SPECIMEN IS ACCEPTABLE FOR SPUTUM CULTURE Performed at [facility], [address], [place], [place] [address]    Report Status 11/23/2023 FINAL  Final  Culture, Respiratory w Gram Stain     Status: None (Preliminary result)   Collection Time: 11/23/23  2:25 AM  Result Value Ref Range Status   Specimen Description EXPECTORATED SPUTUM  Final   Special Requests Normal Reflexed from [id number]  Final   Gram Stain   Final    MODERATE WBC PRESENT, PREDOMINANTLY PMN MODERATE GRAM NEGATIVE RODS RARE YEAST WITH PSEUDOHYPHAE Performed at [facility], [address] [place], [place] [address]    Culture PENDING  Incomplete   Report Status PENDING  Incomplete     Cathlyn July, NP [facility]  11/23/2023  4:10 PM

## 2023-11-23 NOTE — Progress Notes (Signed)
 Advanced left NG tube by 5cm. Now currently positioned at 70cm. KUB orders placed. Will progress to tube feedings once placement is confirmed.

## 2023-11-23 NOTE — Consult Note (Signed)
 NAME:  [name], MRN:  [id number], DOB:  [birth year], LOS: 3 ADMISSION DATE:  11/19/2023, CONSULTATION DATE:  11/22/23 REFERRING MD:  Juvenal, CHIEF COMPLAINT:  SOB   History of Present Illness:  63 year old man w/ hx of EtOH abuse, COPD on O2 at night, recurrent PNA probable aspiration presenting with lethargy and neck swelling.  Found to be in severe sepsis.  Imaging reveals a large necrotic gas-filled abscess extending from submandibular space to manubrium.  Worsening oxygenation and secretions overnights so PCCM consulted.  Talked to mother on phone: FTT for past few months and food aversion as well as unintentional weight loss, diarrhea.  Does not think he's been drinking.  Pertinent  Medical History  Alcohol  abuse Recurrent aspiration COPD followed by Icard Severe protein calorie malnutrition POA  Significant Hospital Events: Including procedures, antibiotic start and stop dates in addition to other pertinent events   1/7 admit 1/9 worsening agitation, O2 needs s/p ID RT neck abscess 1/10 remains intubated, s/p ID  Interim History / Subjective:  Remains intubated Remains critically ill Cachexia  Vent Mode: PRVC FiO2 (%):  [40 %-100 %] 40 % Set Rate:  [8 bmp-20 bmp] 8 bmp Vt Set:  [379 mL] 620 mL PEEP:  [5 cmH20] 5 cmH20 Plateau Pressure:  [15 cmH20-17 cmH20] 15 cmH20   Objective   Blood pressure (!) 86/67, pulse 85, temperature 98.1 F (36.7 C), temperature source Esophageal, resp. rate (!) 8, height 6' (1.829 m), weight 52.9 kg, SpO2 93%.    Vent Mode: PRVC FiO2 (%):  [40 %-100 %] 40 % Set Rate:  [8 bmp-20 bmp] 8 bmp Vt Set:  [620 mL] 620 mL PEEP:  [5 cmH20] 5 cmH20 Plateau Pressure:  [15 cmH20-17 cmH20] 16 cmH20   Intake/Output Summary (Last 24 hours) at 11/23/2023 0758 Last data filed at 11/23/2023 0600 Gross per 24 hour  Intake 2264.67 ml  Output 1350 ml  Net 914.67 ml   Filed Weights   11/20/23 1816 11/22/23 1515 11/23/23 0500  Weight: 52.4 kg 51.8  kg 52.9 kg      REVIEW OF SYSTEMS  PATIENT IS UNABLE TO PROVIDE COMPLETE REVIEW OF SYSTEMS DUE TO SEVERE CRITICAL ILLNESS   PHYSICAL EXAMINATION:  GENERAL:critically ill appearing EYES: Pupils equal, round, reactive to light.  No scleral icterus.  MOUTH: Moist mucosal membrane. INTUBATED NECK: Supple. Bandages in place PULMONARY: Lungs clear to auscultation, +rhonchi CARDIOVASCULAR: S1 and S2.  Regular rate and rhythm GASTROINTESTINAL: Soft, nontender, -distended. Positive bowel sounds.  MUSCULOSKELETAL: No swelling, clubbing, or edema.  NEUROLOGIC: obtunded,sedated SKIN:normal, warm to touch, Capillary refill delayed  Pulses present bilaterally   Resolved Hospital Problem list   N/A  Assessment & Plan:  63 yo white male admitted for sepsis due to RT NECK LARGE MANDIBULAR ABSCESS complicated by worsening respiratory distress multifactorial (septic encephalopathy, delirium, ?EtOH w/d although he denies h/o, baseline COPD, recurrent aspiration hx).   His respiratory and mental status is worse than yesterday.  Does need some sort of drainage and tooth extraction.   Complicated by Severe protein calorie malnutrition POA query pulmonary cachexia vs. Chronic aspiration syndrome, FTT, unintentional weight loss   ACUTE Hypoxic and Hypercapnic Respiratory Failure -continue Mechanical Ventilator support -Wean Fio2 and PEEP as tolerated -VAP/VENT bundle implementation - Wean PEEP & FiO2 as tolerated, maintain SpO2 > 88% - Head of bed elevated 30 degrees, VAP protocol in place - Plateau pressures less than 30 cm H20  - Intermittent chest x-ray & ABG PRN -  Ensure adequate pulmonary hygiene  -will perform SAT/SBT when respiratory parameters are met  INFECTIOUS DISEASE -continue antibiotics as prescribed -follow up cultures -follow up ID consultation   NEUROLOGY ACUTE METABOLIC ENCEPHALOPATHY -need for sedation -Goal RASS -2 to -3   ENDO - ICU hypoglycemic\Hyperglycemia  protocol -check FSBS per protocol   GI GI PROPHYLAXIS as indicated  NUTRITIONAL STATUS DIET-->TF's as tolerated Constipation protocol as indicated   ELECTROLYTES -follow labs as needed -replace as needed -pharmacy consultation and following    Best Practice (right click and Reselect all SmartList Selections daily)   Diet/type: NPO DVT prophylaxis SCD Pressure ulcer(s): skin tear arm GI prophylaxis: PPI Lines: N/A Foley:  N/A Code Status:  full code   Labs   CBC: Recent Labs  Lab 11/19/23 2245 11/20/23 1056 11/21/23 0231 11/22/23 0220 11/22/23 1556 11/22/23 1712  WBC 24.7* 23.7* 19.4* 18.2*  --   --   NEUTROABS 22.9*  --  17.8* 16.8*  --   --   HGB 14.2 12.3* 13.9 12.3* 11.9* 11.6*  HCT 44.3 37.9* 41.5 38.4* 35.0* 34.0*  MCV 92.5 90.0 89.2 91.4  --   --   PLT 102* 94* 87* 75*  --   --     Basic Metabolic Panel: Recent Labs  Lab 11/19/23 2245 11/20/23 0137 11/20/23 1056 11/21/23 0742 11/22/23 0220 11/22/23 1556 11/22/23 1559 11/22/23 1712  NA 136 133* 135 139 146* 145  --  146*  K 5.5* 5.8* 5.3* 4.5 4.1 3.5  --  3.6  CL 84* 82* 86* 88* 93*  --   --   --   CO2 36* 32 36* 39* 44*  --   --   --   GLUCOSE 116* 107* 153* 158* 50*  --   --   --   BUN 52* 53* 66* 55* 37*  --   --   --   CREATININE 1.14 1.14 1.01 0.52* 0.31*  --   --   --   CALCIUM  8.8* 8.7* 8.2* 8.3* 7.9*  --   --   --   MG 1.7  --  1.5* 1.4* 1.8  --  1.3*  --   PHOS 6.7*  --  5.3*  --   --   --  2.3*  --    GFR: Estimated Creatinine Clearance: 69.8 mL/min (A) (by C-G formula based on SCr of 0.31 mg/dL (L)). Recent Labs  Lab 11/19/23 2245 11/20/23 0137 11/20/23 0409 11/20/23 1056 11/21/23 0231 11/22/23 0220  PROCALCITON 2.56  --   --   --   --   --   WBC 24.7*  --   --  23.7* 19.4* 18.2*  LATICACIDVEN 3.9* 3.2* 2.6*  --   --   --     Liver Function Tests: Recent Labs  Lab 11/19/23 2245 11/20/23 0137 11/20/23 1056 11/21/23 0741 11/22/23 0220  AST 213* 222* 142* 87*  63*  ALT 140* 137* 127* 126* 108*  ALKPHOS 124 140* 112 125 110  BILITOT 1.8* 1.8* 1.3* 1.4* 1.1  PROT 6.2* 5.7* 5.4* 6.1* 5.4*  ALBUMIN  2.9* 2.9* 2.6* 2.8* 2.5*   No results for input(s): LIPASE, AMYLASE in the last 168 hours. Recent Labs  Lab 11/20/23 0137  AMMONIA 59*    ABG    Component Value Date/Time   PHART 7.566 (H) 11/22/2023 1712   PCO2ART 49.3 (H) 11/22/2023 1712   PO2ART 160 (H) 11/22/2023 1712   HCO3 45.6 (H) 11/22/2023 1712   TCO2 47 (H) 11/22/2023  1712   O2SAT 100 11/22/2023 1712     Coagulation Profile: Recent Labs  Lab 11/19/23 2245 11/21/23 0231  INR 2.1* 1.7*    Cardiac Enzymes: Recent Labs  Lab 11/19/23 2245  CKTOTAL 128    HbA1C: HbA1c, POC (prediabetic range)  Date/Time Value Ref Range Status  09/17/2018 03:47 PM 10.6 (A) 5.7 - 6.4 % Final   HbA1c, POC (controlled diabetic range)  Date/Time Value Ref Range Status  09/18/2023 11:08 AM 8.0 (A) 0.0 - 7.0 % Final  02/07/2022 08:58 AM 8.8 (A) 0.0 - 7.0 % Final   Hgb A1c MFr Bld  Date/Time Value Ref Range Status  11/20/2023 04:09 AM 6.8 (H) 4.8 - 5.6 % Final    Comment:    (NOTE)         Prediabetes: 5.7 - 6.4         Diabetes: >6.4         Glycemic control for adults with diabetes: <7.0   05/28/2023 07:33 PM 7.4 (H) 4.8 - 5.6 % Final    Comment:    (NOTE) Pre diabetes:          5.7%-6.4%  Diabetes:              >6.4%  Glycemic control for   <7.0% adults with diabetes     CBG: Recent Labs  Lab 11/22/23 1559 11/22/23 1930 11/22/23 2319 11/23/23 0316 11/23/23 0730  GLUCAP 107* 140* 148* 221* 226*    Past Medical History:  He,  has a past medical history of COPD (chronic obstructive pulmonary disease) (HCC), COVID-19 vaccine series completed (12/09/2020), Diabetes mellitus without complication (HCC), Pancreatitis, and PANCREATITIS, ACUTE (10/19/2010).   Surgical History:   Past Surgical History:  Procedure Laterality Date   IRRIGATION AND DEBRIDEMENT ABSCESS N/A  11/22/2023   Procedure: IRRIGATION AND DEBRIDEMENT NECK  ABSCESS;  Surgeon: Roark Rush, MD;  Location: [facility] OR;  Service: ENT;  Laterality: N/A;   NO PAST SURGERIES       Social History:   reports that he has been smoking cigarettes. He has a 35 pack-year smoking history. He has never used smokeless tobacco. He reports current alcohol  use. He reports that he does not use drugs.   Family History:  His family history includes Diabetes in his sister.   Allergies No Known Allergies   Home Medications  Prior to Admission medications   Medication Sig Start Date End Date Taking? Authorizing Provider  glimepiride  (AMARYL ) 1 MG tablet Take 1 tablet (1 mg total) by mouth daily with breakfast. 09/18/23  Yes Vicci Barnie NOVAK, MD  metFORMIN  (GLUCOPHAGE ) 1000 MG tablet Take 1 tablet (1,000 mg total) by mouth 2 (two) times daily with a meal. Patient taking differently: Take 2,000 mg by mouth daily. 09/18/23  Yes Vicci Barnie NOVAK, MD  budesonide -formoterol  (BREYNA ) 160-4.5 MCG/ACT inhaler Inhale 2 puffs into the lungs in the morning and at bedtime. Patient not taking: Reported on 11/20/2023 09/18/23   Vicci Barnie NOVAK, MD  ipratropium-albuterol  (DUONEB) 0.5-2.5 (3) MG/3ML SOLN Take 3 mLs by nebulization every 4 (four) hours as needed. Patient not taking: Reported on 11/20/2023 09/18/23   Vicci Barnie NOVAK, MD  tiotropium (SPIRIVA  HANDIHALER) 18 MCG inhalation capsule Place 1 capsule (18 mcg total) into inhaler and inhale daily. Patient not taking: Reported on 11/20/2023 09/18/23   Vicci Barnie NOVAK, MD        DVT/GI PRX  assessed I Assessed the need for Labs I Assessed the need for Foley I Assessed  the need for Central Venous Line Family Discussion when available I Assessed the need for Mobilization I made an Assessment of medications to be adjusted accordingly Safety Risk assessment completed  CASE DISCUSSED IN MULTIDISCIPLINARY ROUNDS WITH ICU TEAM     Critical Care Time devoted to patient  care services described in this note is 45 minutes.    Nickolas Alm Cellar, M.D.  [facility]  Medical Director [facility] Medical Director [facility] Cardio-Pulmonary Department

## 2023-11-23 NOTE — Plan of Care (Addendum)
 blood cultures with GPC in chains in 1/8 bottles and BCID with Enterococcus faecium Will start Vancomycin per pharmacy recs

## 2023-11-23 NOTE — Progress Notes (Signed)
 Nutrition Follow-up  DOCUMENTATION CODES:   Severe malnutrition in context of chronic illness  INTERVENTION:  CCM agreeable to TF advancement  Once OGT advanced and confirmed by xray: Recommend transitioning to Osmolite 1.5 at 30ml and advance by 10ml q12h to a goal rate of 5ml/hr ( per day) 60ml ProSource TF20 once daily  Provides 2060 kcal, 103 g protein and 1006ml free water  daily  Monitor magnesium  and phosphorus every 12 hours x 6 occurrences, MD to replete as needed, as pt is at risk for refeeding syndrome given malnutrition diagnosis.  Continue thiamine  supplementation  NUTRITION DIAGNOSIS:   Severe Malnutrition related to chronic illness as evidenced by severe muscle depletion, severe fat depletion. - remains applicable  GOAL:   Patient will meet greater than or equal to 90% of their needs - goal unmet  MONITOR:   PO intake, Supplement acceptance, Weight trends  REASON FOR ASSESSMENT:   Consult, Ventilator Enteral/tube feeding initiation and management (trickle TF)  ASSESSMENT:   Pt admitted w/ respiratory distress. Endorsing worsening lethargy and tachypnea earlier in the day. Found to have PNA. PMH: COPD, HTN, HLD, T2DM, alcohol  and tobacco abuse, chronic pancreatitis  01/07 - Admitted w/ PNA 01/08 - CT scan showing submandibular abscess 01/09 - I&D of R neck abscess; remains intubated following procedure  Pt discussed in rounds.  Will need drainage and tooth extraction.   CCM agreeable to advancement of tube feeds. OGT in place. Reviewed abdominal xray and chest xray, noted recommendation for further advancement. Reached out to RN.   Patient is currently intubated on ventilator support MV: 14.2 L/min Temp (24hrs), Avg:95.3 F (35.2 C), Min:92.5 F (33.6 C), Max:98.1 F (36.7 C)  Admit weight: 59 kg (suspect to be stated weight) First measured weight: 52.4 kg (01/07) Current weight: 52.9 kg  Drains/lines: OGT   Medications: colace BID,  SSI 0-9 units q4h, lactulose  BID, solu-medrol , protonix , miralax  BID, thiamine  Drips:  Abx Mg sulfate once Levo @ 2mcg/min K phos  Propofol  stopped  Labs:  Sodium 149 BUN 47 Cr 0.55 Ionized Ca 1.10 Phos 4.5  (0wdl) Mg 2.0 (wdl) CBG's 107-259 x24 hours  Diet Order:   Diet Order             Diet NPO time specified  Diet effective now                   EDUCATION NEEDS:   Not appropriate for education at this time  Skin:  Skin Assessment: Skin Integrity Issues: Skin Integrity Issues:: Incisions Incisions: closed neck incision  Last BM:  1/9 type 3 small  Height:   Ht Readings from Last 1 Encounters:  11/20/23 6' (1.829 m)    Weight:   Wt Readings from Last 1 Encounters:  11/23/23 52.9 kg    Ideal Body Weight:  80.9 kg  BMI:  Body mass index is 15.82 kg/m.  Estimated Nutritional Needs:   Kcal:  1900-2100kcal  Protein:  95-105g  Fluid:  1.9-2L/day  [name], RDN, LDN Clinical Nutrition

## 2023-11-23 NOTE — Progress Notes (Signed)
 PHARMACY - PHYSICIAN COMMUNICATION CRITICAL VALUE ALERT - BLOOD CULTURE IDENTIFICATION (BCID)  [name] is an 63 y.o. male with sepsis and Rt neck abscess  Assessment:  blood cultures with GPC in chains in 1/8 bottles and BCID with Enterococcus faecium  Name of physician (or Provider) Contacted: Dr. Isaiah  Current antibiotics: Clindamycin , Unasyn   Changes to prescribed antibiotics recommended: Add vancomycin  Recommendations accepted by provider   Results for orders placed or performed during the hospital encounter of 11/19/23  Blood Culture ID Panel (Reflexed) (Collected: 11/20/2023  1:40 AM)  Result Value Ref Range   Enterococcus faecalis DETECTED (A) NOT DETECTED   Enterococcus Faecium NOT DETECTED NOT DETECTED   Listeria monocytogenes NOT DETECTED NOT DETECTED   Staphylococcus species NOT DETECTED NOT DETECTED   Staphylococcus aureus (BCID) NOT DETECTED NOT DETECTED   Staphylococcus epidermidis NOT DETECTED NOT DETECTED   Staphylococcus lugdunensis NOT DETECTED NOT DETECTED   Streptococcus species NOT DETECTED NOT DETECTED   Streptococcus agalactiae NOT DETECTED NOT DETECTED   Streptococcus pneumoniae NOT DETECTED NOT DETECTED   Streptococcus pyogenes NOT DETECTED NOT DETECTED   A.calcoaceticus-baumannii NOT DETECTED NOT DETECTED   Bacteroides fragilis NOT DETECTED NOT DETECTED   Enterobacterales NOT DETECTED NOT DETECTED   Enterobacter cloacae complex NOT DETECTED NOT DETECTED   Escherichia coli NOT DETECTED NOT DETECTED   Klebsiella aerogenes NOT DETECTED NOT DETECTED   Klebsiella oxytoca NOT DETECTED NOT DETECTED   Klebsiella pneumoniae NOT DETECTED NOT DETECTED   Proteus species NOT DETECTED NOT DETECTED   Salmonella species NOT DETECTED NOT DETECTED   Serratia marcescens NOT DETECTED NOT DETECTED   Haemophilus influenzae NOT DETECTED NOT DETECTED   Neisseria meningitidis NOT DETECTED NOT DETECTED   Pseudomonas aeruginosa NOT DETECTED NOT DETECTED    Stenotrophomonas maltophilia NOT DETECTED NOT DETECTED   Candida albicans NOT DETECTED NOT DETECTED   Candida auris NOT DETECTED NOT DETECTED   Candida glabrata NOT DETECTED NOT DETECTED   Candida krusei NOT DETECTED NOT DETECTED   Candida parapsilosis NOT DETECTED NOT DETECTED   Candida tropicalis NOT DETECTED NOT DETECTED   Cryptococcus neoformans/gattii NOT DETECTED NOT DETECTED   Vancomycin  resistance NOT DETECTED NOT DETECTED   Prentice Poisson, PharmD Clinical Pharmacist **Pharmacist phone directory can now be found on [email or web address] ([email or web address]).  Listed under [facility].

## 2023-11-23 NOTE — Progress Notes (Signed)
 Pharmacy Antibiotic Note  [name] is a 63 y.o. male admitted on 11/19/2023 with a dental abscess on Unasyn  and clindamycin . Blood cultures show enterococcus faecium in 1/8 bottles and pharmacy consulted to dose vancomycin    -WBC ~ 20, SCr 0.55  Plan: -vancomycin  1000mg  followed by 750mg  IV q12h (estimated AUC= 440) -Will follow renal function, cultures and clinical progress    Height: 6' (182.9 cm) Weight: 52.9 kg (116 lb 10 oz) IBW/kg (Calculated) : 77.6  Temp (24hrs), Avg:95.3 F (35.2 C), Min:92.5 F (33.6 C), Max:98.1 F (36.7 C)  Recent Labs  Lab 11/19/23 2245 11/20/23 0137 11/20/23 0409 11/20/23 1056 11/21/23 0231 11/21/23 0742 11/22/23 0220 11/23/23 0839  WBC 24.7*  --   --  23.7* 19.4*  --  18.2* 20.7*  CREATININE 1.14 1.14  --  1.01  --  0.52* 0.31* 0.55*  LATICACIDVEN 3.9* 3.2* 2.6*  --   --   --   --   --     Estimated Creatinine Clearance: 69.8 mL/min (A) (by C-G formula based on SCr of 0.55 mg/dL (L)).    No Known Allergies  Antimicrobials this admission: 1/10 vanco Unasyn  1/7 >> Clinda 1/9 >>  Dose adjustments this admission:   Microbiology results: 1/10 resp GNR 1/9 Neck abscess >> (GPC pairs) 1/7 BCx >>  GPC in chains in 1/8 bottles and BCID with enterococcus faecium  Thank you for allowing pharmacy to be a part of this patient's care.  Prentice Poisson, PharmD Clinical Pharmacist **Pharmacist phone directory can now be found on [email or web address] ([email or web address]).  Listed under [facility].

## 2023-11-24 ENCOUNTER — Inpatient Hospital Stay (HOSPITAL_COMMUNITY): Payer: Medicaid Other

## 2023-11-24 ENCOUNTER — Other Ambulatory Visit (HOSPITAL_COMMUNITY): Payer: Medicaid Other

## 2023-11-24 DIAGNOSIS — K122 Cellulitis and abscess of mouth: Secondary | ICD-10-CM | POA: Diagnosis not present

## 2023-11-24 DIAGNOSIS — B952 Enterococcus as the cause of diseases classified elsewhere: Secondary | ICD-10-CM | POA: Diagnosis not present

## 2023-11-24 DIAGNOSIS — R7881 Bacteremia: Secondary | ICD-10-CM | POA: Diagnosis not present

## 2023-11-24 DIAGNOSIS — R9431 Abnormal electrocardiogram [ECG] [EKG]: Secondary | ICD-10-CM

## 2023-11-24 LAB — CBC
HCT: 42.7 % (ref 39.0–52.0)
HCT: 41.7 % (ref 39.0–52.0)
Hemoglobin: 13.3 g/dL (ref 13.0–17.0)
Hemoglobin: 12.9 g/dL — ABNORMAL LOW (ref 13.0–17.0)
MCH: 29.4 pg (ref 26.0–34.0)
MCH: 29.1 pg (ref 26.0–34.0)
MCHC: 31.1 g/dL (ref 30.0–36.0)
MCHC: 30.9 g/dL (ref 30.0–36.0)
MCV: 94.5 fL (ref 80.0–100.0)
MCV: 94.1 fL (ref 80.0–100.0)
Platelets: 70 10*3/uL — ABNORMAL LOW (ref 150–400)
Platelets: 59 10*3/uL — ABNORMAL LOW (ref 150–400)
RBC: 4.52 MIL/uL (ref 4.22–5.81)
RBC: 4.43 MIL/uL (ref 4.22–5.81)
RDW: 16.3 % — ABNORMAL HIGH (ref 11.5–15.5)
RDW: 16.3 % — ABNORMAL HIGH (ref 11.5–15.5)
WBC: 34.3 10*3/uL — ABNORMAL HIGH (ref 4.0–10.5)
WBC: 33.8 10*3/uL — ABNORMAL HIGH (ref 4.0–10.5)
nRBC: 0 % (ref 0.0–0.2)
nRBC: 0.1 % (ref 0.0–0.2)

## 2023-11-24 LAB — COMPREHENSIVE METABOLIC PANEL
ALT: 63 U/L — ABNORMAL HIGH (ref 0–44)
AST: 29 U/L (ref 15–41)
Albumin: 2.2 g/dL — ABNORMAL LOW (ref 3.5–5.0)
Alkaline Phosphatase: 107 U/L (ref 38–126)
Anion gap: 10 (ref 5–15)
BUN: 40 mg/dL — ABNORMAL HIGH (ref 8–23)
CO2: 39 mmol/L — ABNORMAL HIGH (ref 22–32)
Calcium: 7.8 mg/dL — ABNORMAL LOW (ref 8.9–10.3)
Chloride: 106 mmol/L (ref 98–111)
Creatinine, Ser: 0.53 mg/dL — ABNORMAL LOW (ref 0.61–1.24)
GFR, Estimated: >60 mL/min (ref >60)
Glucose, Bld: 130 mg/dL — ABNORMAL HIGH (ref 70–99)
Potassium: 3.4 mmol/L — ABNORMAL LOW (ref 3.5–5.1)
Sodium: 155 mmol/L — ABNORMAL HIGH (ref 135–145)
Total Bilirubin: 1.3 mg/dL — ABNORMAL HIGH (ref 0.0–1.2)
Total Protein: 5 g/dL — ABNORMAL LOW (ref 6.5–8.1)

## 2023-11-24 LAB — CULTURE, BLOOD (ROUTINE X 2)
Culture: NO GROWTH
Culture: NO GROWTH

## 2023-11-24 LAB — ECHOCARDIOGRAM COMPLETE
AR max vel: 2.29 cm2
AV Area VTI: 2.19 cm2
AV Area mean vel: 2.15 cm2
AV Mean grad: 2 mm[Hg]
AV Peak grad: 3.2 mm[Hg]
Ao pk vel: 0.89 m/s
Area-P 1/2: 3.77 cm2
S' Lateral: 2.4 cm

## 2023-11-24 LAB — GLUCOSE, CAPILLARY
Glucose-Capillary: 138 mg/dL — ABNORMAL HIGH (ref 70–99)
Glucose-Capillary: 108 mg/dL — ABNORMAL HIGH (ref 70–99)
Glucose-Capillary: 111 mg/dL — ABNORMAL HIGH (ref 70–99)
Glucose-Capillary: 147 mg/dL — ABNORMAL HIGH (ref 70–99)
Glucose-Capillary: 151 mg/dL — ABNORMAL HIGH (ref 70–99)
Glucose-Capillary: 149 mg/dL — ABNORMAL HIGH (ref 70–99)

## 2023-11-24 LAB — CREATININE, SERUM
Creatinine, Ser: 0.55 mg/dL — ABNORMAL LOW (ref 0.61–1.24)
GFR, Estimated: >60 mL/min (ref >60)

## 2023-11-24 LAB — PHOSPHORUS
Phosphorus: 3.5 mg/dL (ref 2.5–4.6)
Phosphorus: 2.8 mg/dL (ref 2.5–4.6)

## 2023-11-24 LAB — LACTIC ACID, PLASMA: Lactic Acid, Venous: 1.9 mmol/L (ref 0.5–1.9)

## 2023-11-24 LAB — MAGNESIUM
Magnesium: 2.1 mg/dL (ref 1.7–2.4)
Magnesium: 1.6 mg/dL — ABNORMAL LOW (ref 1.7–2.4)

## 2023-11-24 LAB — AMMONIA: Ammonia: 34 umol/L (ref 9–35)

## 2023-11-24 MED ORDER — DEXMEDETOMIDINE HCL IN NACL 400 MCG/100ML IV SOLN
0.0000 ug/kg/h | INTRAVENOUS | Status: DC
  Administered 2023-11-24 – 2023-11-25 (×2): 0.4 ug/kg/h via INTRAVENOUS
  Administered 2023-11-26: 0.5 ug/kg/h via INTRAVENOUS
  Administered 2023-11-27: 0.8 ug/kg/h via INTRAVENOUS
  Filled 2023-11-24 (×5): qty 100

## 2023-11-24 MED ORDER — MIDAZOLAM HCL 2 MG/2ML IJ SOLN
2.0000 mg | INTRAMUSCULAR | Status: DC | PRN
  Administered 2023-11-25: 2 mg via INTRAVENOUS
  Filled 2023-11-24: qty 2

## 2023-11-24 MED ORDER — FREE WATER
350.0000 mL | Status: DC
  Administered 2023-11-24 – 2023-11-26 (×17): 350 mL

## 2023-11-24 MED ORDER — LACTATED RINGERS IV BOLUS
1000.0000 mL | Freq: Once | INTRAVENOUS | Status: AC
  Administered 2023-11-24: 1000 mL via INTRAVENOUS

## 2023-11-24 MED ORDER — ENOXAPARIN SODIUM 40 MG/0.4ML IJ SOSY
40.0000 mg | PREFILLED_SYRINGE | INTRAMUSCULAR | Status: DC
  Administered 2023-11-24 – 2023-11-25 (×2): 40 mg via SUBCUTANEOUS
  Filled 2023-11-24 (×2): qty 0.4

## 2023-11-24 MED ORDER — DEXTROSE 5 % IV SOLN: INTRAVENOUS | Status: AC

## 2023-11-24 MED ORDER — ACETAMINOPHEN 325 MG PO TABS
650.0000 mg | ORAL_TABLET | Freq: Four times a day (QID) | ORAL | Status: DC | PRN
  Administered 2023-11-24 – 2023-11-26 (×4): 650 mg
  Filled 2023-11-24 (×4): qty 2

## 2023-11-24 NOTE — Progress Notes (Addendum)
 PT Cancellation Note  Patient Details Name: CHAYIM BIALAS MRN: [id number] DOB: [birth year]   Cancelled Treatment:    Reason Eval/Treat Not Completed: Patient not medically ready   Spoke with RN. Pt not ready to participate with physical therapy yet. Will check back Monday and see if he can tolerate.  Leontine Roads, PT, DPT [facility]  Rehabilitation Services Physical Therapist Office: [phone] Website: [email or web address]   Leontine GORMAN Roads 11/24/2023, 1:04 PM

## 2023-11-24 NOTE — Plan of Care (Signed)
  Problem: Fluid Volume: Goal: Ability to maintain a balanced intake and output will improve Outcome: Progressing   Problem: Nutritional: Goal: Maintenance of adequate nutrition will improve Outcome: Progressing   Problem: Coping: Goal: Ability to adjust to condition or change in health will improve Outcome: Not Progressing   Problem: Fluid Volume: Goal: Hemodynamic stability will improve Outcome: Not Progressing

## 2023-11-24 NOTE — Plan of Care (Signed)
  Problem: Fluid Volume: Goal: Ability to maintain a balanced intake and output will improve Outcome: Progressing   Problem: Metabolic: Goal: Ability to maintain appropriate glucose levels will improve Outcome: Progressing   Problem: Nutritional: Goal: Maintenance of adequate nutrition will improve Outcome: Progressing Goal: Progress toward achieving an optimal weight will improve Outcome: Progressing   Problem: Skin Integrity: Goal: Risk for impaired skin integrity will decrease Outcome: Progressing   Problem: Tissue Perfusion: Goal: Adequacy of tissue perfusion will improve Outcome: Progressing   Problem: Fluid Volume: Goal: Hemodynamic stability will improve Outcome: Progressing   Problem: Clinical Measurements: Goal: Diagnostic test results will improve Outcome: Progressing   Problem: Respiratory: Goal: Ability to maintain adequate ventilation will improve Outcome: Progressing   Problem: Fluid Volume: Goal: Hemodynamic stability will improve Outcome: Progressing   Problem: Respiratory: Goal: Ability to maintain adequate ventilation will improve Outcome: Progressing

## 2023-11-24 NOTE — Progress Notes (Signed)
 Patient ID: [name], male   DOB: 1961/08/03, 63 y.o.   MRN: [id number]  Drain removed. The wound is wide open. There is some exudate in the wound . He is still intubated and needs support  He is going to continue issues and recur in the neck if the teeth are not taken care of. Needs dental care immediately. Will pack the wound just to debride some

## 2023-11-24 NOTE — Progress Notes (Signed)
*  PRELIMINARY RESULTS* Echocardiogram 2D Echocardiogram has been performed.  [name] 11/24/2023, 1:58 PM

## 2023-11-24 NOTE — Consult Note (Signed)
 NAME:  [name], MRN:  [id number], DOB:  Jan 18, 1961, LOS: 4 ADMISSION DATE:  11/19/2023, CONSULTATION DATE:  11/22/23 REFERRING MD:  Juvenal, CHIEF COMPLAINT:  SOB   History of Present Illness:  63 year old man w/ hx of EtOH abuse, COPD on O2 at night, recurrent PNA probable aspiration presenting with lethargy and neck swelling.  Found to be in severe sepsis.  Imaging reveals a large necrotic gas-filled abscess extending from submandibular space to manubrium.  Worsening oxygenation and secretions overnights so PCCM consulted.  Talked to mother on phone: FTT for past few months and food aversion as well as unintentional weight loss, diarrhea.  Does not think he's been drinking.  Pertinent  Medical History  Alcohol  abuse Recurrent aspiration COPD followed by Icard Severe protein calorie malnutrition POA  Significant Hospital Events: Including procedures, antibiotic start and stop dates in addition to other pertinent events   1/7 admit 1/9 worsening agitation, O2 needs s/p ID RT neck abscess 1/10 remains intubated, s/p ID  Interim History / Subjective:   Remains intubated.  Levo requirements are slightly up.  On 60% FiO2 and 10 of PEEP   Objective   Blood pressure 93/71, pulse 73, temperature 100 F (37.8 C), resp. rate 16, height 6' (1.829 m), weight 51.4 kg, SpO2 98%.    Vent Mode: PRVC FiO2 (%):  [40 %-100 %] 60 % Set Rate:  [14 bmp] 14 bmp Vt Set:  [620 mL] 620 mL PEEP:  [5 cmH20-10 cmH20] 10 cmH20 Plateau Pressure:  [9 cmH20-22 cmH20] 22 cmH20   Intake/Output Summary (Last 24 hours) at 11/24/2023 0818 Last data filed at 11/24/2023 0600 Gross per 24 hour  Intake 1972.33 ml  Output 1620 ml  Net 352.33 ml   Filed Weights   11/22/23 1515 11/23/23 0500 11/24/23 0500  Weight: 51.8 kg 52.9 kg 51.4 kg   PHYSICAL EXAMINATION: General: Chronically ill-appearing man in NAD. emaciated HEENT: [place]/AT, anicteric sclera, PERRL, moist mucous membranes. ETT Neuro: Sedated.  Responds to verbal stimuli. Not following commands. Moves all 4 extremities spontaneously.  CV: RRR, no m/g/r. PULM: Breathing even and unlabored. Lung fields with scattered rhonchi. GI: Soft, nontender, nondistended. Normoactive bowel sounds. Extremities: No LE edema noted. Skin: Warm/dry,.   Lab/imaging reviewed Significant for today 145, potassium 3.4, BUN/creatinine 40/0.53 WBC count up to 34.3, platelets 70 No new imaging  Resolved Hospital Problem list   N/A  Assessment & Plan:  63 yo white male admitted for sepsis due to RT NECK LARGE MANDIBULAR ABSCESS complicated by worsening respiratory distress multifactorial (septic encephalopathy, delirium, ?EtOH w/d although he denies h/o, baseline COPD, recurrent aspiration hx).   Complicated by Severe protein calorie malnutrition POA query pulmonary cachexia vs. Chronic aspiration syndrome, FTT, unintentional weight l  Acute hypoxic, hypercarbic respiratory failure Baseline COPD Bilateral pneumonia on CT scan He is stable for urine strep and Enterococcus in blood Appreciate ID consult.  Antibiotic coverage changed to Unasyn .  Off vancomycin  and clindamycin  Check 2D echocardiogram, repeat cultures  Septic shock secondary to pneumonia, mandibular abscess Status post incision and drainage by ENT Continue antibiotics as above follow cultures Give additional IV fluids and wean down Levophed .  Recheck ammonia and lactic acid  Acute metabolic encephalopathy due to sepsis Supportive care, monitor  Hyponatremia Start free water  in the tube and through D5 drip.  Severe protein calorie malnutrition POA Tube feeds  Best Practice (right click and Reselect all SmartList Selections daily)   Diet/type: tubefeeds DVT prophylaxis LMWH Pressure ulcer(s): skin  tear arm GI prophylaxis: PPI Lines: N/A Foley:  Yes, and it is still needed Code Status:  full code  Critical care time:   The patient is critically ill with multiple organ  system failure and requires high complexity decision making for assessment and support, frequent evaluation and titration of therapies, advanced monitoring, review of radiographic studies and interpretation of complex data.   Critical Care Time devoted to patient care services, exclusive of separately billable procedures, described in this note is 35 minutes.   Bowden Boody MD [facility] See Amion for pager  If no response to pager , please call [phone] until 7pm After 7:00 pm call Elink  [phone] 11/24/2023, 8:24 AM

## 2023-11-24 NOTE — Progress Notes (Addendum)
 [facility]  Date of Admission:  11/19/2023   Total days of inpatient antibiotics 4  Principal Problem:   Bacteremia due to Enterococcus Active Problems:   TOBACCO ABUSE   COPD with acute exacerbation (HCC)   Chronic pancreatitis, unspecified pancreatitis type (HCC)   Alcohol  abuse   Type 2 diabetes mellitus with hyperglycemia (HCC)   Acute on chronic respiratory failure with hypoxia and hypercapnia (HCC)   CAP (community acquired pneumonia) - R[facility]   Sepsis with acute organ dysfunction without septic shock (HCC)   Hyperkalemia   Elevated LFTs   Elevated INR   Protein-calorie malnutrition, severe   Submandibular abscess - right side   Dental infection   Need for hepatitis B screening test          Assessment: 63 year old male with alcoholism, COPD, tobacco abuse, cachexia admitted with hypoxia initially because of COPD exacerbation/pneumonia found to have multilocular abscess and submandibular space as well as: #E faecalis bacteremia likely secondary to neck abscess suspected from  odontogenic source #Neck abscess status post I&D - Needs evaluation by dentistry-seen by ENT today drain removed.  Noted that patient is going to continue issues with recur and neck of teeth are not taking care of.  Needs dental care immediately. - Overnight patient continues to fever with Tmax 100.8, worsening leukocytosis in the setting of steroids. #EtOH abuse #Cachexia suspected secondary to substance abuse, poor dentition, nutritional support - Hepatitis A, B, and C serology pending  Recommendations: -Continue Unasyn  - Follow respiratory cultures, GNR so far - Neck cultures growing multiple organisms non predominant - Follow-up TTE results -Follow-up blood cultures to ensure clearance from 1/10   Microbiology:   Antibiotics:  Cultures: Blood 1/7 1/4 bottles E faecalis 1/11 pending Urine  Other 1/9 neck abscess I&D multiple organisms present, none  predominant, fungal cultures pending Blood cultures 1/10 GNR  SUBJECTIVE: Intubated Interval: Tmax 100.8 overnight, WBC 30 3.8K  Review of Systems: Review of Systems  Unable to perform ROS: Intubated     Scheduled Meds:  arformoterol   15 mcg Nebulization BID   budesonide  (PULMICORT ) nebulizer solution  0.5 mg Nebulization BID   Chlorhexidine  Gluconate Cloth  6 each Topical Daily   docusate  100 mg Per Tube BID   enoxaparin  (LOVENOX ) injection  40 mg Subcutaneous Q24H   feeding supplement (PROSource TF20)  60 mL Per Tube Daily   fentaNYL  (SUBLIMAZE ) injection  50 mcg Intravenous Once   free water   350 mL Per Tube Q4H   insulin  aspart  0-9 Units Subcutaneous Q4H   lactulose   20 g Per Tube BID   nicotine   14 mg Transdermal Daily   mouth rinse  15 mL Mouth Rinse Q2H   pantoprazole  (PROTONIX ) IV  40 mg Intravenous QHS   polyethylene glycol  17 g Per Tube Daily   revefenacin   175 mcg Nebulization Daily   [START ON 11/25/2023] thiamine  (VITAMIN B1) injection  100 mg Intravenous Q24H   Continuous Infusions:  ampicillin -sulbactam (UNASYN ) IV Stopped (11/24/23 1038)   dexmedetomidine  (PRECEDEX ) IV infusion 0.4 mcg/kg/hr (11/24/23 1123)   dextrose  75 mL/hr at 11/24/23 1111   feeding supplement (OSMOLITE 1.5 CAL) 30 mL/hr at 11/24/23 1111   fentaNYL  infusion INTRAVENOUS 150 mcg/hr (11/24/23 1111)   norepinephrine  (LEVOPHED ) Adult infusion 3 mcg/min (11/24/23 1111)   PRN Meds:.acetaminophen , fentaNYL , Gerhardt's butt cream, midazolam , [DISCONTINUED] ondansetron  **OR** ondansetron  (ZOFRAN ) IV, mouth rinse No Known Allergies  OBJECTIVE: Vitals:   11/24/23  1030 11/24/23 1045 11/24/23 1100 11/24/23 1115  BP: 118/80 113/87 119/84 106/72  Pulse: 85 78 81 71  Resp: 19 14 18 15   Temp: 99 F (37.2 C) 98.8 F (37.1 C) 98.6 F (37 C) 98.4 F (36.9 C)  TempSrc:      SpO2: 97% 97% 97% 97%  Weight:      Height:       Body mass index is 15.37 kg/m.  Physical Exam Constitutional:       Comments: intubated  HENT:     Head: Normocephalic and atraumatic.     Right Ear: External ear normal.     Left Ear: External ear normal.     Nose: No congestion or rhinorrhea.     Mouth/Throat:     Mouth: Mucous membranes are moist.     Pharynx: Oropharynx is clear.  Eyes:     Extraocular Movements: Extraocular movements intact.     Conjunctiva/sclera: Conjunctivae normal.     Pupils: Pupils are equal, round, and reactive to light.  Cardiovascular:     Rate and Rhythm: Normal rate and regular rhythm.     Heart sounds: No murmur heard.    No friction rub. No gallop.  Pulmonary:     Effort: Pulmonary effort is normal.     Breath sounds: Normal breath sounds.  Abdominal:     General: Abdomen is flat. Bowel sounds are normal.     Palpations: Abdomen is soft.  Musculoskeletal:     Cervical back: Normal range of motion and neck supple.       Lab Results Lab Results  Component Value Date   WBC 33.8 (H) 11/24/2023   HGB 12.9 (L) 11/24/2023   HCT 41.7 11/24/2023   MCV 94.1 11/24/2023   PLT 59 (L) 11/24/2023    Lab Results  Component Value Date   CREATININE 0.55 (L) 11/24/2023   BUN 40 (H) 11/24/2023   NA 155 (H) 11/24/2023   K 3.4 (L) 11/24/2023   CL 106 11/24/2023   CO2 39 (H) 11/24/2023    Lab Results  Component Value Date   ALT 63 (H) 11/24/2023   AST 29 11/24/2023   ALKPHOS 107 11/24/2023   BILITOT 1.3 (H) 11/24/2023        Loney Stank, MD [facility] 11/24/2023, 2:07 PM  I have personally spent 52 minutes involved in face-to-face and non-face-to-face activities for this patient on the day of the visit. Professional time spent includes the following activities: Preparing to see the patient (review of tests), Obtaining and/or reviewing separately obtained history (admission/discharge record), Performing a medically appropriate examination and/or evaluation , Ordering medications/tests/procedures,  referring and communicating with other health care professionals, Documenting clinical information in the EMR, Independently interpreting results (not separately reported), Communicating results to the patient/family/caregiver, Counseling and educating the patient/family/caregiver and Care coordination (not separately reported).

## 2023-11-24 NOTE — Progress Notes (Signed)
 eLink Physician-Brief Progress Note Patient Name: [name] DOB: Jul 25, 1961 MRN: [id number]   Date of Service  11/24/2023  HPI/Events of Note    eICU Interventions     Tylenol  to be changed from rectal to per tube     Gerrit Ahumada 11/24/2023, 1:14 AM

## 2023-11-25 ENCOUNTER — Inpatient Hospital Stay (HOSPITAL_COMMUNITY): Payer: Medicaid Other

## 2023-11-25 ENCOUNTER — Other Ambulatory Visit: Payer: Self-pay

## 2023-11-25 DIAGNOSIS — B952 Enterococcus as the cause of diseases classified elsewhere: Secondary | ICD-10-CM | POA: Diagnosis not present

## 2023-11-25 DIAGNOSIS — R7881 Bacteremia: Secondary | ICD-10-CM | POA: Diagnosis not present

## 2023-11-25 LAB — POCT I-STAT 7, (LYTES, BLD GAS, ICA,H+H)
Acid-Base Excess: 11 mmol/L — ABNORMAL HIGH (ref 0.0–2.0)
Acid-Base Excess: 15 mmol/L — ABNORMAL HIGH (ref 0.0–2.0)
Bicarbonate: 39.4 mmol/L — ABNORMAL HIGH (ref 20.0–28.0)
Bicarbonate: 39.7 mmol/L — ABNORMAL HIGH (ref 20.0–28.0)
Calcium, Ion: 1.03 mmol/L — ABNORMAL LOW (ref 1.15–1.40)
Calcium, Ion: 1.13 mmol/L — ABNORMAL LOW (ref 1.15–1.40)
HCT: 45 % (ref 39.0–52.0)
HCT: 45 % (ref 39.0–52.0)
Hemoglobin: 15.3 g/dL (ref 13.0–17.0)
Hemoglobin: 15.3 g/dL (ref 13.0–17.0)
O2 Saturation: 84 %
O2 Saturation: 96 %
Patient temperature: 100.8
Patient temperature: 38.5
Potassium: 2.9 mmol/L — ABNORMAL LOW (ref 3.5–5.1)
Potassium: 3.4 mmol/L — ABNORMAL LOW (ref 3.5–5.1)
Sodium: 138 mmol/L (ref 135–145)
Sodium: 143 mmol/L (ref 135–145)
TCO2: 41 mmol/L — ABNORMAL HIGH (ref 22–32)
TCO2: 41 mmol/L — ABNORMAL HIGH (ref 22–32)
pCO2 arterial: 50.2 mm[Hg] — ABNORMAL HIGH (ref 32–48)
pCO2 arterial: 69.7 mm[Hg] (ref 32–48)
pH, Arterial: 7.366 (ref 7.35–7.45)
pH, Arterial: 7.51 — ABNORMAL HIGH (ref 7.35–7.45)
pO2, Arterial: 58 mm[Hg] — ABNORMAL LOW (ref 83–108)
pO2, Arterial: 84 mm[Hg] (ref 83–108)

## 2023-11-25 LAB — CULTURE, RESPIRATORY W GRAM STAIN: Special Requests: NORMAL

## 2023-11-25 LAB — BASIC METABOLIC PANEL
Anion gap: 10 (ref 5–15)
BUN: 35 mg/dL — ABNORMAL HIGH (ref 8–23)
CO2: 35 mmol/L — ABNORMAL HIGH (ref 22–32)
Calcium: 7.1 mg/dL — ABNORMAL LOW (ref 8.9–10.3)
Chloride: 98 mmol/L (ref 98–111)
Creatinine, Ser: 0.47 mg/dL — ABNORMAL LOW (ref 0.61–1.24)
GFR, Estimated: >60 mL/min (ref >60)
Glucose, Bld: 283 mg/dL — ABNORMAL HIGH (ref 70–99)
Potassium: 3 mmol/L — ABNORMAL LOW (ref 3.5–5.1)
Sodium: 143 mmol/L (ref 135–145)

## 2023-11-25 LAB — CBC
HCT: 44 % (ref 39.0–52.0)
Hemoglobin: 13.9 g/dL (ref 13.0–17.0)
MCH: 29.7 pg (ref 26.0–34.0)
MCHC: 31.6 g/dL (ref 30.0–36.0)
MCV: 94 fL (ref 80.0–100.0)
Platelets: 46 10*3/uL — ABNORMAL LOW (ref 150–400)
RBC: 4.68 MIL/uL (ref 4.22–5.81)
RDW: 16.6 % — ABNORMAL HIGH (ref 11.5–15.5)
WBC: 38.9 10*3/uL — ABNORMAL HIGH (ref 4.0–10.5)
nRBC: 0 % (ref 0.0–0.2)

## 2023-11-25 LAB — COMPREHENSIVE METABOLIC PANEL
ALT: 217 U/L — ABNORMAL HIGH (ref 0–44)
AST: 567 U/L — ABNORMAL HIGH (ref 15–41)
Albumin: 1.7 g/dL — ABNORMAL LOW (ref 3.5–5.0)
Alkaline Phosphatase: 216 U/L — ABNORMAL HIGH (ref 38–126)
Anion gap: 12 (ref 5–15)
BUN: 37 mg/dL — ABNORMAL HIGH (ref 8–23)
CO2: 38 mmol/L — ABNORMAL HIGH (ref 22–32)
Calcium: 7.7 mg/dL — ABNORMAL LOW (ref 8.9–10.3)
Chloride: 101 mmol/L (ref 98–111)
Creatinine, Ser: 0.65 mg/dL (ref 0.61–1.24)
GFR, Estimated: >60 mL/min (ref >60)
Glucose, Bld: 274 mg/dL — ABNORMAL HIGH (ref 70–99)
Potassium: 3.4 mmol/L — ABNORMAL LOW (ref 3.5–5.1)
Sodium: 151 mmol/L — ABNORMAL HIGH (ref 135–145)
Total Bilirubin: 1.3 mg/dL — ABNORMAL HIGH (ref 0.0–1.2)
Total Protein: 4.5 g/dL — ABNORMAL LOW (ref 6.5–8.1)

## 2023-11-25 LAB — PHOSPHORUS
Phosphorus: 3 mg/dL (ref 2.5–4.6)
Phosphorus: 2.8 mg/dL (ref 2.5–4.6)

## 2023-11-25 LAB — CULTURE, BLOOD (ROUTINE X 2): Culture: NO GROWTH

## 2023-11-25 LAB — MAGNESIUM
Magnesium: 1.5 mg/dL — ABNORMAL LOW (ref 1.7–2.4)
Magnesium: 1.4 mg/dL — ABNORMAL LOW (ref 1.7–2.4)

## 2023-11-25 LAB — GLUCOSE, CAPILLARY
Glucose-Capillary: 211 mg/dL — ABNORMAL HIGH (ref 70–99)
Glucose-Capillary: 234 mg/dL — ABNORMAL HIGH (ref 70–99)
Glucose-Capillary: 263 mg/dL — ABNORMAL HIGH (ref 70–99)
Glucose-Capillary: 234 mg/dL — ABNORMAL HIGH (ref 70–99)
Glucose-Capillary: 193 mg/dL — ABNORMAL HIGH (ref 70–99)

## 2023-11-25 MED ORDER — FUROSEMIDE 10 MG/ML IJ SOLN
40.0000 mg | Freq: Once | INTRAMUSCULAR | Status: AC
  Administered 2023-11-25: 40 mg via INTRAVENOUS
  Filled 2023-11-25: qty 4

## 2023-11-25 MED ORDER — LACTATED RINGERS IV BOLUS
1000.0000 mL | Freq: Once | INTRAVENOUS | Status: AC
  Administered 2023-11-25: 1000 mL via INTRAVENOUS

## 2023-11-25 MED ORDER — POTASSIUM CHLORIDE 10 MEQ/50ML IV SOLN
10.0000 meq | INTRAVENOUS | Status: AC
  Administered 2023-11-25 – 2023-11-26 (×4): 10 meq via INTRAVENOUS
  Filled 2023-11-25 (×3): qty 50

## 2023-11-25 MED ORDER — HYDROCORTISONE SOD SUC (PF) 100 MG IJ SOLR
50.0000 mg | Freq: Four times a day (QID) | INTRAMUSCULAR | Status: DC
Start: 2023-11-25 — End: 2023-11-27
  Administered 2023-11-25 – 2023-11-27 (×7): 50 mg via INTRAVENOUS
  Filled 2023-11-25 (×8): qty 2

## 2023-11-25 MED ORDER — CIPROFLOXACIN IN D5W 400 MG/200ML IV SOLN
400.0000 mg | Freq: Two times a day (BID) | INTRAVENOUS | Status: DC
Start: 2023-11-25 — End: 2023-11-26
  Administered 2023-11-25 (×2): 400 mg via INTRAVENOUS
  Filled 2023-11-25 (×3): qty 200

## 2023-11-25 MED ORDER — MAGNESIUM SULFATE 4 GM/100ML IV SOLN
4.0000 g | Freq: Once | INTRAVENOUS | Status: AC
  Administered 2023-11-25: 4 g via INTRAVENOUS
  Filled 2023-11-25: qty 100

## 2023-11-25 MED ORDER — VASOPRESSIN 20 UNITS/100 ML INFUSION FOR SHOCK
0.0000 [IU]/min | INTRAVENOUS | Status: DC
  Administered 2023-11-25 – 2023-11-27 (×5): 0.03 [IU]/min via INTRAVENOUS
  Filled 2023-11-25 (×5): qty 100

## 2023-11-25 MED ORDER — INSULIN ASPART 100 UNIT/ML IJ SOLN: 0.0000 [IU] | INTRAMUSCULAR | Status: DC

## 2023-11-25 MED ORDER — ALBUMIN HUMAN 25 % IV SOLN
12.5000 g | Freq: Once | INTRAVENOUS | Status: AC
  Administered 2023-11-25: 12.5 g via INTRAVENOUS
  Filled 2023-11-25: qty 50

## 2023-11-25 MED ORDER — NOREPINEPHRINE 4 MG/250ML-% IV SOLN
0.0000 ug/min | INTRAVENOUS | Status: DC
  Administered 2023-11-25: 16 ug/min via INTRAVENOUS
  Administered 2023-11-25: 15 ug/min via INTRAVENOUS
  Filled 2023-11-25 (×2): qty 250

## 2023-11-25 MED ORDER — INSULIN ASPART 100 UNIT/ML IJ SOLN
3.0000 [IU] | INTRAMUSCULAR | Status: DC
Start: 2023-11-25 — End: 2023-11-27
  Administered 2023-11-25: 3 [IU] via SUBCUTANEOUS

## 2023-11-25 MED ORDER — DEXTROSE 5 % IV SOLN: INTRAVENOUS | Status: DC

## 2023-11-25 MED ORDER — NOREPINEPHRINE 16 MG/250ML-% IV SOLN
0.0000 ug/min | INTRAVENOUS | Status: DC
  Administered 2023-11-26: 12 ug/min via INTRAVENOUS
  Administered 2023-11-26: 13 ug/min via INTRAVENOUS
  Filled 2023-11-25 (×3): qty 250

## 2023-11-25 MED ORDER — MAGNESIUM SULFATE 50 % IJ SOLN: 6.0000 g | Freq: Once | INTRAVENOUS | Status: DC

## 2023-11-25 MED ORDER — MAGNESIUM SULFATE 2 GM/50ML IV SOLN
2.0000 g | Freq: Once | INTRAVENOUS | Status: AC
  Administered 2023-11-26: 2 g via INTRAVENOUS
  Filled 2023-11-25: qty 50

## 2023-11-25 MED ORDER — INSULIN ASPART 100 UNIT/ML IJ SOLN
0.0000 [IU] | INTRAMUSCULAR | Status: DC
  Administered 2023-11-25: 11 [IU] via SUBCUTANEOUS
  Administered 2023-11-25: 7 [IU] via SUBCUTANEOUS
  Administered 2023-11-25 – 2023-11-26 (×2): 4 [IU] via SUBCUTANEOUS
  Administered 2023-11-26: 3 [IU] via SUBCUTANEOUS

## 2023-11-25 MED ORDER — THIAMINE HCL 100 MG/ML IJ SOLN
100.0000 mg | Freq: Every day | INTRAMUSCULAR | Status: DC
  Administered 2023-11-25 – 2023-11-27 (×3): 100 mg via INTRAVENOUS
  Filled 2023-11-25 (×2): qty 2

## 2023-11-25 NOTE — Progress Notes (Signed)
 eLink Physician-Brief Progress Note Patient Name: DIN BOOKWALTER DOB: 1961-05-17 MRN: [id number]   Date of Service  11/25/2023  HPI/Events of Note  Notified of desaturation and increased work of breathing Seen on PRVC 14/620/100% 10 PEEP. Previous FiO2 70%. Peak pressure 25, MV 10 Gradual increase in pressor requirement the whole day Ciprofloxacin  started for Pseudomonas pneumonia Continued on Unasyn  for enterococcus bacteremia Strep pneumo Ag positive Blood cultures 1/10 and 1/11 NGTD S/p I/D of mandibular abscess 1/9 ABG 7.36/69/58 On D5W at 75 for hypernatremia Elevated right atrial pressure on echo 1/11 in the background of COPD  eICU Interventions  Start stress dose steroids Give albumin  25% 12.5 ml and monitor BP response Discussed with BSRN and BSRT     Intervention Category Intermediate Interventions: Respiratory distress - evaluation and management;Hypotension - evaluation and management  Damien ONEIDA Grout 11/25/2023, 8:17 PM

## 2023-11-25 NOTE — Progress Notes (Signed)
 NAME:  [name], MRN:  [id number], DOB:  1961/05/23, LOS: 5 ADMISSION DATE:  11/19/2023, CONSULTATION DATE:  11/22/23 REFERRING MD:  Juvenal, CHIEF COMPLAINT:  SOB   History of Present Illness:  63 year old man w/ hx of EtOH abuse, COPD on O2 at night, recurrent PNA probable aspiration presenting with lethargy and neck swelling.  Found to be in severe sepsis.  Imaging reveals a large necrotic gas-filled abscess extending from submandibular space to manubrium.  Worsening oxygenation and secretions overnights so PCCM consulted.  Talked to mother on phone: FTT for past few months and food aversion as well as unintentional weight loss, diarrhea.  Does not think he's been drinking.  Pertinent  Medical History  Alcohol  abuse Recurrent aspiration COPD followed by Icard Severe protein calorie malnutrition POA  Significant Hospital Events: Including procedures, antibiotic start and stop dates in addition to other pertinent events   1/7 admit 1/9 worsening agitation, O2 needs s/p ID RT neck abscess 1/10 remains intubated, s/p ID  Interim History / Subjective:   Remains intubated.  Levo requirements are slightly up.  On 50% FiO2 and 10 of PEEP Low-grade fevers.  WBC count increased at 38.9  Objective   Blood pressure 100/68, pulse 72, temperature (!) 100.8 F (38.2 C), temperature source Bladder, resp. rate 14, height 6' (1.829 m), weight 54 kg, SpO2 92%.    Vent Mode: PRVC FiO2 (%):  [50 %] 50 % Set Rate:  [14 bmp] 14 bmp Vt Set:  [620 mL] 620 mL PEEP:  [5 cmH20-10 cmH20] 10 cmH20 Plateau Pressure:  [22 cmH20-38 cmH20] 22 cmH20   Intake/Output Summary (Last 24 hours) at 11/25/2023 0857 Last data filed at 11/25/2023 0800 Gross per 24 hour  Intake 6936.81 ml  Output 1275 ml  Net 5661.81 ml   Filed Weights   11/23/23 0500 11/24/23 0500 11/25/23 0403  Weight: 52.9 kg 51.4 kg 54 kg   PHYSICAL EXAMINATION: Gen:      No acute distress, chronically ill-appearing HEENT:  EOMI,  sclera anicteric Neck:     No masses; no thyromegaly neck dressing Lungs:    Clear to auscultation bilaterally; normal respiratory effort CV:         Regular rate and rhythm; no murmurs Abd:      + bowel sounds; soft, non-tender; no palpable masses, no distension Ext:    No edema; adequate peripheral perfusion Skin:      Warm and dry; no rash Neuro: Sedated  Lab/imaging reviewed Sodium slightly down at 141, potassium 3.4, AST 567, ALT 217 WBC 38.9  Resolved Hospital Problem list   N/A  Assessment & Plan:  63 yo white male admitted for sepsis due to RT NECK LARGE MANDIBULAR ABSCESS complicated by worsening respiratory distress multifactorial (septic encephalopathy, delirium, ?EtOH w/d although he denies h/o, baseline COPD, recurrent aspiration hx).   Complicated by Severe protein calorie malnutrition POA query pulmonary cachexia vs. Chronic aspiration syndrome, FTT, unintentional weight l  Acute hypoxic, hypercarbic respiratory failure, bilateral pneumonia Baseline COPD He is positive for urine strep and Enterococcus in blood Appreciate ID consult.  Antibiotic coverage changed to Unasyn .  Off vancomycin  and clindamycin  Follow repeat blood cultures.  No evidence of vegetation on transthoracic echocardiogram Bronchodilators  Septic shock secondary to pneumonia, mandibular abscess Status post incision and drainage by ENT Continue antibiotics as above follow cultures Continue IV fluids Lactic acid is normalized. Discussed with dental service, Dr. Maryruth, Dr. Kimble and Dr. Okey regarding tooth extraction.  Will contact  Dr. Sheryle tomorrow to see if the teeth can be extracted.  If unable to do this then consider transfer to a different facility.  Acute metabolic encephalopathy due to sepsis Supportive care, monitor  Hyponatremia Continue free water  in the tube and through D5 drip.  Hyperglycemia due to dextrose  drip Increase SSI to moderate scale  Severe protein calorie  malnutrition POA Tube feeds  Best Practice (right click and Reselect all SmartList Selections daily)   Diet/type: tubefeeds DVT prophylaxis LMWH Pressure ulcer(s): skin tear arm GI prophylaxis: PPI Lines: N/A Foley:  Yes, and it is still needed Code Status:  full code. Mother updated 1/12.  Critical care time:   The patient is critically ill with multiple organ system failure and requires high complexity decision making for assessment and support, frequent evaluation and titration of therapies, advanced monitoring, review of radiographic studies and interpretation of complex data.   Critical Care Time devoted to patient care services, exclusive of separately billable procedures, described in this note is 35 minutes.   Ahmiyah Coil MD [facility] See Amion for pager  If no response to pager , please call [phone] until 7pm After 7:00 pm call Elink  [phone] 11/25/2023, 8:57 AM

## 2023-11-25 NOTE — Procedures (Signed)
 Central Venous Catheter Insertion Procedure Note  AKIO HUDNALL  [id number]  10/17/1961  Date:11/25/23  Time:3:56 PM   Provider Performing:Jesua Tamblyn   Procedure: Insertion of Non-tunneled Central Venous [phone]) with US  guidance (23062)   Indication(s) Medication administration and Difficult access  Consent Risks of the procedure as well as the alternatives and risks of each were explained to the patient and/or caregiver.  Consent for the procedure was obtained and is signed in the bedside chart  Anesthesia Topical only with 1% lidocaine    Timeout Verified patient identification, verified procedure, site/side was marked, verified correct patient position, special equipment/implants available, medications/allergies/relevant history reviewed, required imaging and test results available.  Sterile Technique Maximal sterile technique including full sterile barrier drape, hand hygiene, sterile gown, sterile gloves, mask, hair covering, sterile ultrasound probe cover (if used).  Procedure Description Area of catheter insertion was cleaned with chlorhexidine  and draped in sterile fashion.  With real-time ultrasound guidance a central venous catheter was placed into the left internal jugular vein. Nonpulsatile blood flow and easy flushing noted in all ports.  The catheter was sutured in place and sterile dressing applied.  Complications/Tolerance None; patient tolerated the procedure well. Chest X-ray is ordered to verify placement for internal jugular or subclavian cannulation.   Chest x-ray is not ordered for femoral cannulation.  EBL Minimal  Specimen(s) None     Lonna Coder MD [facility] See Amion for pager  If no response to pager , please call [phone] until 7pm After 7:00 pm call Elink  [phone] 11/25/2023, 3:56 PM

## 2023-11-25 NOTE — Progress Notes (Signed)
 Ground team to bedside to assess pt for hypoxia. Pt on vent at 100% FiO2, 10 PEEP, SpO2-mid 80s. Vent settings changed by MD Noreen. Orders for lasix  40mg  IV, KCL runs, Mag replacement, and hold TF(okay to give meds and free water  flushes). Orders initiated.

## 2023-11-25 NOTE — Progress Notes (Signed)
 eLink Physician-Brief Progress Note Patient Name: FILIP LUTEN DOB: 08-Apr-1961 MRN: [id number]   Date of Service  11/25/2023  HPI/Events of Note  No significant clinical improvement with interventions  eICU Interventions  Stat CXR ordered Bedside CCM team informed to evaluate     Intervention Category Intermediate Interventions: Hypotension - evaluation and management;Respiratory distress - evaluation and management  Damien ONEIDA Grout 11/25/2023, 10:14 PM

## 2023-11-25 NOTE — Progress Notes (Addendum)
 PICC order received. Noted pt had repeat blood cultures done 1/11. Discussed with ID MD who recommended holding off in the setting of fevers and elevated WBC. Dr. Isaiah Serge aware: OK to DC order.

## 2023-11-25 NOTE — Progress Notes (Signed)
 ID Brief Note  1/10 Resp Cx+ pseudomonas aeruginosa -cipro added

## 2023-11-25 NOTE — Progress Notes (Addendum)
 ENT Progress Note:   HPI: [name] is an 63 y.o. male with hx of EtOH abuse, COPD, tobacco abuse, cachexia admitted with hypoxia in the setting of COPD exacerbation/pneumonia found to have multilocular abscess and submandibular space with the neck abscess likely due to odontogenic source who underwent I&D by Dr. Roark 11/22/2023 with Penrose placement, currently intubated due to sepsis versus bacteremia and pneumonia.  Followed by ID.  On Unasyn  IV.  No acute changes overnight still intubated.  No wound issues in the submental region, Nugauze packing still in place. WBC 38.9 today (33.8 yesterday. 34.3 11/24/23).  Due to no dental care/oral surgery consultations not available inpatient, odontogenic source of infection has not been removed.  Febrile to 101.1 this a.m.  Records Reviewed:  ID note 11/24/23 Assessment: 63 year old male with alcoholism, COPD, tobacco abuse, cachexia admitted with hypoxia initially because of COPD exacerbation/pneumonia found to have multilocular abscess and submandibular space as well as: #E faecalis bacteremia likely secondary to neck abscess suspected from  odontogenic source #Neck abscess status post I&D - Needs evaluation by dentistry-seen by ENT today drain removed.  Noted that patient is going to continue issues with recur and neck of teeth are not taking care of.  Needs dental care immediately. - Overnight patient continues to fever with Tmax 100.8, worsening leukocytosis in the setting of steroids. #EtOH abuse #Cachexia suspected secondary to substance abuse, poor dentition, nutritional support - Hepatitis A, B, and C serology pending   Recommendations: -Continue Unasyn  - Follow respiratory cultures, GNR so far - Neck cultures growing multiple organisms unknown.  Dominant - Follow-up TTE results -Follow-up blood cultures to ensure clearance from 1/10   Past Medical History:  Diagnosis Date   COPD (chronic obstructive pulmonary disease) (HCC)     COVID-19 vaccine series completed 12/09/2020   Diabetes mellitus without complication (HCC)    Pancreatitis    PANCREATITIS, ACUTE 10/19/2010   Qualifier: Diagnosis of   By: Gladis FNP, Delorise          Past Surgical History:  Procedure Laterality Date   IRRIGATION AND DEBRIDEMENT ABSCESS N/A 11/22/2023   Procedure: IRRIGATION AND DEBRIDEMENT NECK  ABSCESS;  Surgeon: Roark Rush, MD;  [facility];  Service: ENT;  Laterality: N/A;   NO PAST SURGERIES      Family History  Problem Relation Age of Onset   Diabetes Sister     Social History:  reports that he has been smoking cigarettes. He has a 35 pack-year smoking history. He has never used smokeless tobacco. He reports current alcohol  use. He reports that he does not use drugs.  Allergies: No Known Allergies  Medications: I have reviewed the patient's current medications.  The PMH, PSH, Medications, Allergies, and SH were reviewed and updated.   Blood pressure 100/67, pulse 75, temperature (!) 100.6 F (38.1 C), resp. rate 20, height 6' (1.829 m), weight 54 kg, SpO2 (!) 88%.  PHYSICAL EXAM:  Exam: General: Intubated on vent support HEENT: Eyes closed, ET tube in place Neuro: moves to tactile stimuli sedated Mouth: Multiple teeth missing poor dentition with multiple teeth affected by dental caries, no floor of mouth edema Neck: Submental area with open I&D incision packed with Nu Gauze, no fluctuance on exam, some surrounding erythema, no subcutaneous emphysema on palpation  Studies Reviewed:11/21/23 FINDINGS: Pharynx and larynx: There is a large multiloculated multi spatial complex air and fluid containing rim enhancing fluid collection with the dominant collection centered around the right submandibular space measuring 5.2 x  3.9 x 4.6 cm (series 4, image 64). Additional rim enhancing components are also present at the floor of the mouth (series 4, image 55), the subcutaneous soft tissues overlying the thyroid   cartilage with a extension inferiorly to the level of the left sternoclavicular joint (series 4, image 88-101).   Bilateral tonsils are normal in appearance. The epiglottis is normal in appearance. There are layering secretions in the hypopharynx. Central airways patent.   Salivary glands: Bilateral salivary glands are poorly visualized due to poor contrast resolution.   Thyroid : Normal.   Lymph nodes: None enlarged or abnormal density.   Vascular: Negative.   Limited intracranial: Negative.   Visualized orbits: Negative.   Mastoids and visualized paranasal sinuses: No middle ear or mastoid effusion. Paranasal sinuses are notable for mild mucosal thickening of the floor of bilateral maxillary sinuses. Orbits are unremarkable.   Skeleton: No acute fracture. There is odontogenic disease with multiple large dental caries.   Upper chest: Moderate to severe centrilobular emphysema. See separately dictated CT chest for additional findings   Other: None.   IMPRESSION: 1. Large multiloculated multi spatial likely odontogenic abscess centered around the right submandibular space measuring 5.2 x 3.9 x 4.6 cm. Additional rim enhancing components are also present at the floor of the mouth and the subcutaneous soft tissues overlying the thyroid  cartilage with extension inferiorly to the level of the left sternoclavicular joint. 2. 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  CT PE 11/23/23  IMPRESSION: 1. No CT evidence of pulmonary artery embolus. 2. Progression of bilateral lower lobe infiltrate and consolidative changes since the prior CT. 3. Mucous impaction of the left lower lobe bronchus and distal right lower lobe bronchi.  Assessment/Plan: Neck abscess odontogenic source s/p ID by Dr Roark on 11/22/23 Sepsis  Bacteremia Pneumonia COPD exacerbation Respiratory failure  -Antibiotic management per ID -Patient requires oral surgery OMFS evaluation for dental  extraction which is unfortunately not available here -Neck wound appears to be stable without signs of abscess recollection on exam -ENT will follow -okay to extubate from ENT standpoint when appropriate -Will slowly pull out neck packing over the course of the next 1 to 2 days pending clinical picture and serial exams   Elena Larry, MD Otolaryngology [facility] Phone: [phone] Fax: [phone]    11/25/2023, 8:31 AM

## 2023-11-25 NOTE — Progress Notes (Signed)
Vent settings changed by MD. 

## 2023-11-26 ENCOUNTER — Inpatient Hospital Stay (HOSPITAL_COMMUNITY): Payer: Medicaid Other

## 2023-11-26 ENCOUNTER — Inpatient Hospital Stay (HOSPITAL_COMMUNITY): Payer: Self-pay | Admitting: Certified Registered"

## 2023-11-26 DIAGNOSIS — L0211 Cutaneous abscess of neck: Secondary | ICD-10-CM | POA: Diagnosis not present

## 2023-11-26 DIAGNOSIS — B952 Enterococcus as the cause of diseases classified elsewhere: Secondary | ICD-10-CM | POA: Diagnosis not present

## 2023-11-26 DIAGNOSIS — R7881 Bacteremia: Secondary | ICD-10-CM | POA: Diagnosis not present

## 2023-11-26 LAB — CBC
HCT: 42 % (ref 39.0–52.0)
Hemoglobin: 13.4 g/dL (ref 13.0–17.0)
MCH: 29.7 pg (ref 26.0–34.0)
MCHC: 31.9 g/dL (ref 30.0–36.0)
MCV: 93.1 fL (ref 80.0–100.0)
Platelets: 29 10*3/uL — CL (ref 150–400)
RBC: 4.51 MIL/uL (ref 4.22–5.81)
RDW: 16.4 % — ABNORMAL HIGH (ref 11.5–15.5)
WBC: 25.9 10*3/uL — ABNORMAL HIGH (ref 4.0–10.5)
nRBC: 0 % (ref 0.0–0.2)

## 2023-11-26 LAB — COMPREHENSIVE METABOLIC PANEL
ALT: 687 U/L — ABNORMAL HIGH (ref 0–44)
AST: 425 U/L — ABNORMAL HIGH (ref 15–41)
Albumin: 1.6 g/dL — ABNORMAL LOW (ref 3.5–5.0)
Alkaline Phosphatase: 320 U/L — ABNORMAL HIGH (ref 38–126)
Anion gap: 11 (ref 5–15)
BUN: 27 mg/dL — ABNORMAL HIGH (ref 8–23)
CO2: 36 mmol/L — ABNORMAL HIGH (ref 22–32)
Calcium: 6.9 mg/dL — ABNORMAL LOW (ref 8.9–10.3)
Chloride: 93 mmol/L — ABNORMAL LOW (ref 98–111)
Creatinine, Ser: 0.51 mg/dL — ABNORMAL LOW (ref 0.61–1.24)
GFR, Estimated: >60 mL/min (ref >60)
Glucose, Bld: 160 mg/dL — ABNORMAL HIGH (ref 70–99)
Potassium: 3.6 mmol/L (ref 3.5–5.1)
Sodium: 140 mmol/L (ref 135–145)
Total Bilirubin: 3.2 mg/dL — ABNORMAL HIGH (ref 0.0–1.2)
Total Protein: 4.3 g/dL — ABNORMAL LOW (ref 6.5–8.1)

## 2023-11-26 LAB — GLUCOSE, CAPILLARY
Glucose-Capillary: 100 mg/dL — ABNORMAL HIGH (ref 70–99)
Glucose-Capillary: 142 mg/dL — ABNORMAL HIGH (ref 70–99)
Glucose-Capillary: 175 mg/dL — ABNORMAL HIGH (ref 70–99)
Glucose-Capillary: 276 mg/dL — ABNORMAL HIGH (ref 70–99)
Glucose-Capillary: 42 mg/dL — CL (ref 70–99)
Glucose-Capillary: 64 mg/dL — ABNORMAL LOW (ref 70–99)
Glucose-Capillary: 69 mg/dL — ABNORMAL LOW (ref 70–99)
Glucose-Capillary: 75 mg/dL (ref 70–99)
Glucose-Capillary: 77 mg/dL (ref 70–99)
Glucose-Capillary: 85 mg/dL (ref 70–99)
Glucose-Capillary: 86 mg/dL (ref 70–99)

## 2023-11-26 LAB — CULTURE, BLOOD (ROUTINE X 2)

## 2023-11-26 LAB — LEGIONELLA PNEUMOPHILA SEROGP 1 UR AG: L. pneumophila Serogp 1 Ur Ag: NEGATIVE

## 2023-11-26 LAB — MAGNESIUM: Magnesium: 2.2 mg/dL (ref 1.7–2.4)

## 2023-11-26 LAB — PHOSPHORUS: Phosphorus: 2.9 mg/dL (ref 2.5–4.6)

## 2023-11-26 MED ORDER — CIPROFLOXACIN IN D5W 400 MG/200ML IV SOLN
400.0000 mg | Freq: Three times a day (TID) | INTRAVENOUS | Status: DC
  Administered 2023-11-26: 400 mg via INTRAVENOUS
  Filled 2023-11-26 (×2): qty 200

## 2023-11-26 MED ORDER — POTASSIUM CHLORIDE 20 MEQ PO PACK
40.0000 meq | PACK | Freq: Once | ORAL | Status: AC
  Administered 2023-11-26: 40 meq
  Filled 2023-11-26: qty 2

## 2023-11-26 MED ORDER — PIPERACILLIN-TAZOBACTAM 3.375 G IVPB
3.3750 g | Freq: Three times a day (TID) | INTRAVENOUS | Status: DC
  Administered 2023-11-26 – 2023-11-27 (×4): 3.375 g via INTRAVENOUS
  Filled 2023-11-26 (×4): qty 50

## 2023-11-26 NOTE — Progress Notes (Signed)
 ENT Progress Note:  Update 11/26/23 no acute events overnight, still intubated on vent support but more alert this morning, responds to touch stimuli and eyes are open.  No changes in appearance of the neck wound.  WBC 25.9 down from 38.9 yesterday.  Followed by ID, on Unasyn .  Last fever of 100.4 at 2 AM.   HPI: [name] is an 63 y.o. male with hx of EtOH abuse, COPD, tobacco abuse, cachexia admitted with hypoxia in the setting of COPD exacerbation/pneumonia found to have multilocular abscess and submandibular space with the neck abscess likely due to odontogenic source who underwent I&D by Dr. Roark 11/22/2023 with Penrose placement, currently intubated due to sepsis versus bacteremia and pneumonia.  Followed by ID.  On Unasyn  IV.  No acute changes overnight still intubated.  No wound issues in the submental region, Nugauze packing still in place. WBC 38.9 today (33.8 yesterday. 34.3 11/24/23).  Due to no dental care/oral surgery consultations not available inpatient, odontogenic source of infection has not been removed.  Febrile to 101.1 this a.m.  Records Reviewed:  ID note 11/24/23 Assessment: 63 year old male with alcoholism, COPD, tobacco abuse, cachexia admitted with hypoxia initially because of COPD exacerbation/pneumonia found to have multilocular abscess and submandibular space as well as: #E faecalis bacteremia likely secondary to neck abscess suspected from  odontogenic source #Neck abscess status post I&D - Needs evaluation by dentistry-seen by ENT today drain removed.  Noted that patient is going to continue issues with recur and neck of teeth are not taking care of.  Needs dental care immediately. - Overnight patient continues to fever with Tmax 100.8, worsening leukocytosis in the setting of steroids. #EtOH abuse #Cachexia suspected secondary to substance abuse, poor dentition, nutritional support - Hepatitis A, B, and C serology pending   Recommendations: -Continue  Unasyn  - Follow respiratory cultures, GNR so far - Neck cultures growing multiple organisms unknown.  Dominant - Follow-up TTE results -Follow-up blood cultures to ensure clearance from 1/10   Past Medical History:  Diagnosis Date   COPD (chronic obstructive pulmonary disease) (HCC)    COVID-19 vaccine series completed 12/09/2020   Diabetes mellitus without complication (HCC)    Pancreatitis    PANCREATITIS, ACUTE 10/19/2010   Qualifier: Diagnosis of   By: Gladis FNP, Delorise          Past Surgical History:  Procedure Laterality Date   IRRIGATION AND DEBRIDEMENT ABSCESS N/A 11/22/2023   Procedure: IRRIGATION AND DEBRIDEMENT NECK  ABSCESS;  Surgeon: Roark Rush, MD;  Location: [facility] OR;  Service: ENT;  Laterality: N/A;   NO PAST SURGERIES      Family History  Problem Relation Age of Onset   Diabetes Sister     Social History:  reports that he has been smoking cigarettes. He has a 35 pack-year smoking history. He has never used smokeless tobacco. He reports current alcohol  use. He reports that he does not use drugs.  Allergies: No Known Allergies  Medications: I have reviewed the patient's current medications.  The PMH, PSH, Medications, Allergies, and SH were reviewed and updated.   Blood pressure 100/73, pulse 68, temperature 99.9 F (37.7 C), resp. rate 16, height 6' (1.829 m), weight 58 kg, SpO2 95%.  PHYSICAL EXAM:  Exam: General: Intubated on vent support HEENT: Eyes open, ETT tube in place Neuro: moves to tactile stimuli sedated Mouth: Multiple teeth missing poor dentition with multiple teeth affected by dental caries, no floor of mouth edema Neck: Submental area with open I&D  incision packed with Nu Gauze/packing changed, neck is soft on exam without crepitance.   Studies Reviewed:11/21/23 FINDINGS: Pharynx and larynx: There is a large multiloculated multi spatial complex air and fluid containing rim enhancing fluid collection with the dominant collection  centered around the right submandibular space measuring 5.2 x 3.9 x 4.6 cm (series 4, image 64). Additional rim enhancing components are also present at the floor of the mouth (series 4, image 55), the subcutaneous soft tissues overlying the thyroid  cartilage with a extension inferiorly to the level of the left sternoclavicular joint (series 4, image [id number]).   Bilateral tonsils are normal in appearance. The epiglottis is normal in appearance. There are layering secretions in the hypopharynx. Central airways patent.   Salivary glands: Bilateral salivary glands are poorly visualized due to poor contrast resolution.   Thyroid : Normal.   Lymph nodes: None enlarged or abnormal density.   Vascular: Negative.   Limited intracranial: Negative.   Visualized orbits: Negative.   Mastoids and visualized paranasal sinuses: No middle ear or mastoid effusion. Paranasal sinuses are notable for mild mucosal thickening of the floor of bilateral maxillary sinuses. Orbits are unremarkable.   Skeleton: No acute fracture. There is odontogenic disease with multiple large dental caries.   Upper chest: Moderate to severe centrilobular emphysema. See separately dictated CT chest for additional findings   Other: None.   IMPRESSION: 1. Large multiloculated multi spatial likely odontogenic abscess centered around the right submandibular space measuring 5.2 x 3.9 x 4.6 cm. Additional rim enhancing components are also present at the floor of the mouth and the subcutaneous soft tissues overlying the thyroid  cartilage with extension inferiorly to the level of the left sternoclavicular joint. 2. 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  CT PE 11/23/23  IMPRESSION: 1. No CT evidence of pulmonary artery embolus. 2. Progression of bilateral lower lobe infiltrate and consolidative changes since the prior CT. 3. Mucous impaction of the left lower lobe bronchus and distal right lower lobe  bronchi.  Assessment/Plan: Neck abscess odontogenic source s/p ID by Dr Roark on 11/22/23 Sepsis  Bacteremia Pneumonia COPD exacerbation Respiratory failure  -Antibiotic management per ID -Patient requires oral surgery OMFS evaluation for dental extraction  -Neck wound appears to be stable without signs of abscess recollection on exam -packing changed today -ENT will follow -okay to extubate from ENT standpoint when appropriate    Elena Larry, MD Otolaryngology [facility] Phone: [phone] Fax: [phone]    11/26/2023, 7:30 AM

## 2023-11-26 NOTE — Consult Note (Signed)
 Reason for Consult:Dental caries/Submandibular abscess Referring Physician: Tommy Goostree is an 63 y.o. male h/o HTN, COPD, ETOH abuse, DM2, Chronic pancreatitis, exacerbation pneumonia, s/p incision and drainage submandibular space neck abscess 11/22/2023. Concern for possible dental etiology of abscess.   Past Medical History:  Diagnosis Date   COPD (chronic obstructive pulmonary disease) (HCC)    COVID-19 vaccine series completed 12/09/2020   Diabetes mellitus without complication (HCC)    Pancreatitis    PANCREATITIS, ACUTE 10/19/2010   Qualifier: Diagnosis of   By: Gladis FNP, Delorise          Past Surgical History:  Procedure Laterality Date   IRRIGATION AND DEBRIDEMENT ABSCESS N/A 11/22/2023   Procedure: IRRIGATION AND DEBRIDEMENT NECK  ABSCESS;  Surgeon: Roark Rush, MD;  Location: [facility] OR;  Service: ENT;  Laterality: N/A;   NO PAST SURGERIES      Family History  Problem Relation Age of Onset   Diabetes Sister     Social History:  reports that he has been smoking cigarettes. He has a 35 pack-year smoking history. He has never used smokeless tobacco. He reports current alcohol  use. He reports that he does not use drugs.  Allergies: No Known Allergies  Medications: I have reviewed the patient's current medications.  Results for orders placed or performed during the hospital encounter of 11/19/23 (from the past 48 hours)  Glucose, capillary     Status: Abnormal   Collection Time: 11/24/23  4:05 PM  Result Value Ref Range   Glucose-Capillary 147 (H) 70 - 99 mg/dL    Comment: Glucose reference range applies only to samples taken after fasting for at least 8 hours.  Magnesium      Status: Abnormal   Collection Time: 11/24/23  5:04 PM  Result Value Ref Range   Magnesium  1.6 (L) 1.7 - 2.4 mg/dL    Comment: Performed at [facility], [address], [place], [place] [address]  Phosphorus     Status: None   Collection Time: 11/24/23  5:04 PM  Result Value Ref  Range   Phosphorus 2.8 2.5 - 4.6 mg/dL    Comment: Performed at [facility], [address] [place], [place] [address]  Glucose, capillary     Status: Abnormal   Collection Time: 11/24/23  7:15 PM  Result Value Ref Range   Glucose-Capillary 151 (H) 70 - 99 mg/dL    Comment: Glucose reference range applies only to samples taken after fasting for at least 8 hours.  Glucose, capillary     Status: Abnormal   Collection Time: 11/24/23 11:17 PM  Result Value Ref Range   Glucose-Capillary 149 (H) 70 - 99 mg/dL    Comment: Glucose reference range applies only to samples taken after fasting for at least 8 hours.  Glucose, capillary     Status: Abnormal   Collection Time: 11/25/23  3:25 AM  Result Value Ref Range   Glucose-Capillary 211 (H) 70 - 99 mg/dL    Comment: Glucose reference range applies only to samples taken after fasting for at least 8 hours.  CBC     Status: Abnormal   Collection Time: 11/25/23  5:17 AM  Result Value Ref Range   WBC 38.9 (H) 4.0 - 10.5 K/uL   RBC 4.68 4.22 - 5.81 MIL/uL   Hemoglobin 13.9 13.0 - 17.0 g/dL   HCT 55.9 60.9 - 47.9 %   MCV 94.0 80.0 - 100.0 fL   MCH 29.7 26.0 - 34.0 pg  MCHC 31.6 30.0 - 36.0 g/dL   RDW 83.3 (H) 88.4 - 84.4 %   Platelets 46 (L) 150 - 400 K/uL    Comment: Immature Platelet Fraction may be clinically indicated, consider ordering this additional test [id number] REPEATED TO VERIFY    nRBC 0.0 0.0 - 0.2 %    Comment: Performed at [facility], [address], [place], [place] [address]  Glucose, capillary     Status: Abnormal   Collection Time: 11/25/23  7:14 AM  Result Value Ref Range   Glucose-Capillary 234 (H) 70 - 99 mg/dL    Comment: Glucose reference range applies only to samples taken after fasting for at least 8 hours.  Comprehensive metabolic panel     Status: Abnormal   Collection Time: 11/25/23  7:40 AM  Result Value Ref Range   Sodium 151 (H) 135 - 145 mmol/L   Potassium 3.4 (L) 3.5 - 5.1 mmol/L    Chloride 101 98 - 111 mmol/L   CO2 38 (H) 22 - 32 mmol/L   Glucose, Bld 274 (H) 70 - 99 mg/dL    Comment: Glucose reference range applies only to samples taken after fasting for at least 8 hours.   BUN 37 (H) 8 - 23 mg/dL   Creatinine, Ser 9.34 0.61 - 1.24 mg/dL   Calcium  7.7 (L) 8.9 - 10.3 mg/dL   Total Protein 4.5 (L) 6.5 - 8.1 g/dL   Albumin  1.7 (L) 3.5 - 5.0 g/dL   AST 432 (H) 15 - 41 U/L   ALT 217 (H) 0 - 44 U/L   Alkaline Phosphatase 216 (H) 38 - 126 U/L   Total Bilirubin 1.3 (H) 0.0 - 1.2 mg/dL   GFR, Estimated >39 >39 mL/min    Comment: (NOTE) Calculated using the CKD-EPI Creatinine Equation (2021)    Anion gap 12 5 - 15    Comment: Performed at [facility], [address] [place], [place] [address]  Magnesium      Status: Abnormal   Collection Time: 11/25/23  7:40 AM  Result Value Ref Range   Magnesium  1.5 (L) 1.7 - 2.4 mg/dL    Comment: Performed at [facility], [address] [place], [place] [address]  Phosphorus     Status: None   Collection Time: 11/25/23  7:40 AM  Result Value Ref Range   Phosphorus 3.0 2.5 - 4.6 mg/dL    Comment: Performed at [facility], [address], [place], [place] [address]  Glucose, capillary     Status: Abnormal   Collection Time: 11/25/23 11:28 AM  Result Value Ref Range   Glucose-Capillary 263 (H) 70 - 99 mg/dL    Comment: Glucose reference range applies only to samples taken after fasting for at least 8 hours.  Glucose, capillary     Status: Abnormal   Collection Time: 11/25/23  3:55 PM  Result Value Ref Range   Glucose-Capillary 234 (H) 70 - 99 mg/dL    Comment: Glucose reference range applies only to samples taken after fasting for at least 8 hours.  Magnesium      Status: Abnormal   Collection Time: 11/25/23  4:53 PM  Result Value Ref Range   Magnesium  1.4 (L) 1.7 - 2.4 mg/dL    Comment: Performed at [facility], [address], [place], [place] [address]  Phosphorus     Status: None   Collection  Time: 11/25/23  4:53 PM  Result Value Ref Range  Phosphorus 2.8 2.5 - 4.6 mg/dL    Comment: Performed at [facility], [address], [place], [place] [address]  Basic metabolic panel     Status: Abnormal   Collection Time: 11/25/23  4:53 PM  Result Value Ref Range   Sodium 143 135 - 145 mmol/L    Comment: DELTA CHECK NOTED   Potassium 3.0 (L) 3.5 - 5.1 mmol/L   Chloride 98 98 - 111 mmol/L   CO2 35 (H) 22 - 32 mmol/L   Glucose, Bld 283 (H) 70 - 99 mg/dL    Comment: Glucose reference range applies only to samples taken after fasting for at least 8 hours.   BUN 35 (H) 8 - 23 mg/dL   Creatinine, Ser 9.52 (L) 0.61 - 1.24 mg/dL   Calcium  7.1 (L) 8.9 - 10.3 mg/dL   GFR, Estimated >39 >39 mL/min    Comment: (NOTE) Calculated using the CKD-EPI Creatinine Equation (2021)    Anion gap 10 5 - 15    Comment: Performed at [facility], [address] [place], [place] [address]  I-STAT 7, (LYTES, BLD GAS, ICA, H+H)     Status: Abnormal   Collection Time: 11/25/23  8:12 PM  Result Value Ref Range   pH, Arterial 7.366 7.35 - 7.45   pCO2 arterial 69.7 (HH) 32 - 48 mmHg   pO2, Arterial 58 (L) 83 - 108 mmHg   Bicarbonate 39.4 (H) 20.0 - 28.0 mmol/L   TCO2 41 (H) 22 - 32 mmol/L   O2 Saturation 84 %   Acid-Base Excess 11.0 (H) 0.0 - 2.0 mmol/L   Sodium 143 135 - 145 mmol/L   Potassium 2.9 (L) 3.5 - 5.1 mmol/L   Calcium , Ion 1.13 (L) 1.15 - 1.40 mmol/L   HCT 45.0 39.0 - 52.0 %   Hemoglobin 15.3 13.0 - 17.0 g/dL   Patient temperature 61.4 C    Collection site RADIAL, ALLEN'S TEST ACCEPTABLE    Drawn by RT    Sample type ARTERIAL    Comment NOTIFIED PHYSICIAN   Glucose, capillary     Status: Abnormal   Collection Time: 11/25/23  8:29 PM  Result Value Ref Range   Glucose-Capillary 193 (H) 70 - 99 mg/dL    Comment: Glucose reference range applies only to samples taken after fasting for at least 8 hours.  I-STAT 7, (LYTES, BLD GAS, ICA, H+H)     Status: Abnormal   Collection  Time: 11/25/23 11:43 PM  Result Value Ref Range   pH, Arterial 7.510 (H) 7.35 - 7.45   pCO2 arterial 50.2 (H) 32 - 48 mmHg   pO2, Arterial 84 83 - 108 mmHg   Bicarbonate 39.7 (H) 20.0 - 28.0 mmol/L   TCO2 41 (H) 22 - 32 mmol/L   O2 Saturation 96 %   Acid-Base Excess 15.0 (H) 0.0 - 2.0 mmol/L   Sodium 138 135 - 145 mmol/L   Potassium 3.4 (L) 3.5 - 5.1 mmol/L   Calcium , Ion 1.03 (L) 1.15 - 1.40 mmol/L   HCT 45.0 39.0 - 52.0 %   Hemoglobin 15.3 13.0 - 17.0 g/dL   Patient temperature 899.1 F    Collection site RADIAL, ALLEN'S TEST ACCEPTABLE    Drawn by RT    Sample type ARTERIAL   Glucose, capillary     Status: Abnormal   Collection Time: 11/26/23 12:07 AM  Result Value Ref Range   Glucose-Capillary 276 (H) 70 - 99 mg/dL    Comment: Glucose  reference range applies only to samples taken after fasting for at least 8 hours.  Glucose, capillary     Status: Abnormal   Collection Time: 11/26/23 12:11 AM  Result Value Ref Range   Glucose-Capillary 175 (H) 70 - 99 mg/dL    Comment: Glucose reference range applies only to samples taken after fasting for at least 8 hours.  Glucose, capillary     Status: Abnormal   Collection Time: 11/26/23  4:06 AM  Result Value Ref Range   Glucose-Capillary 142 (H) 70 - 99 mg/dL    Comment: Glucose reference range applies only to samples taken after fasting for at least 8 hours.  CBC     Status: Abnormal   Collection Time: 11/26/23  4:24 AM  Result Value Ref Range   WBC 25.9 (H) 4.0 - 10.5 K/uL   RBC 4.51 4.22 - 5.81 MIL/uL   Hemoglobin 13.4 13.0 - 17.0 g/dL   HCT 57.9 60.9 - 47.9 %   MCV 93.1 80.0 - 100.0 fL   MCH 29.7 26.0 - 34.0 pg   MCHC 31.9 30.0 - 36.0 g/dL   RDW 83.5 (H) 88.4 - 84.4 %   Platelets 29 (LL) 150 - 400 K/uL    Comment: Immature Platelet Fraction may be clinically indicated, consider ordering this additional test [id number] REPEATED TO VERIFY PLATELET COUNT CONFIRMED BY SMEAR THIS CRITICAL RESULT HAS VERIFIED AND BEEN CALLED TO  STEPHANIE SAVAGE, RN BY MARY GRAMINSKI ON 01 13 2025 AT 0526, AND HAS BEEN READ BACK.     nRBC 0.0 0.0 - 0.2 %    Comment: Performed at [facility], [address] [place], [place] [address]  Comprehensive metabolic panel     Status: Abnormal   Collection Time: 11/26/23  4:24 AM  Result Value Ref Range   Sodium 140 135 - 145 mmol/L   Potassium 3.6 3.5 - 5.1 mmol/L   Chloride 93 (L) 98 - 111 mmol/L   CO2 36 (H) 22 - 32 mmol/L   Glucose, Bld 160 (H) 70 - 99 mg/dL    Comment: Glucose reference range applies only to samples taken after fasting for at least 8 hours.   BUN 27 (H) 8 - 23 mg/dL   Creatinine, Ser 9.48 (L) 0.61 - 1.24 mg/dL   Calcium  6.9 (L) 8.9 - 10.3 mg/dL   Total Protein 4.3 (L) 6.5 - 8.1 g/dL   Albumin  1.6 (L) 3.5 - 5.0 g/dL   AST 574 (H) 15 - 41 U/L   ALT 687 (H) 0 - 44 U/L   Alkaline Phosphatase 320 (H) 38 - 126 U/L   Total Bilirubin 3.2 (H) 0.0 - 1.2 mg/dL   GFR, Estimated >39 >39 mL/min    Comment: (NOTE) Calculated using the CKD-EPI Creatinine Equation (2021)    Anion gap 11 5 - 15    Comment: Performed at [facility], [address], [place], [place] [address]  Magnesium      Status: None   Collection Time: 11/26/23  4:24 AM  Result Value Ref Range   Magnesium  2.2 1.7 - 2.4 mg/dL    Comment: Performed at [facility], [address], [place], [place] [address]  Phosphorus     Status: None   Collection Time: 11/26/23  4:24 AM  Result Value Ref Range   Phosphorus 2.9 2.5 - 4.6 mg/dL    Comment: Performed at [facility], [address], [place], [place] [address]  Glucose, capillary  Status: None   Collection Time: 11/26/23  7:45 AM  Result Value Ref Range   Glucose-Capillary 86 70 - 99 mg/dL    Comment: Glucose reference range applies only to samples taken after fasting for at least 8 hours.  Glucose, capillary     Status: Abnormal   Collection Time: 11/26/23 11:16 AM  Result Value Ref Range   Glucose-Capillary 69 (L) 70 - 99  mg/dL    Comment: Glucose reference range applies only to samples taken after fasting for at least 8 hours.  Glucose, capillary     Status: Abnormal   Collection Time: 11/26/23 11:17 AM  Result Value Ref Range   Glucose-Capillary 64 (L) 70 - 99 mg/dL    Comment: Glucose reference range applies only to samples taken after fasting for at least 8 hours.  Glucose, capillary     Status: None   Collection Time: 11/26/23 11:45 AM  Result Value Ref Range   Glucose-Capillary 85 70 - 99 mg/dL    Comment: Glucose reference range applies only to samples taken after fasting for at least 8 hours.    DG CHEST PORT 1 VIEW Result Date: 11/26/2023 CLINICAL DATA:  Acute respiratory failure. EXAM: PORTABLE CHEST 1 VIEW COMPARISON:  Chest x-rays from yesterday. FINDINGS: Unchanged endotracheal and enteric tubes. Unchanged left internal jugular central venous catheter. The heart size and mediastinal contours are within normal limits. Background emphysematous changes again noted. Diffuse interstitial and airspace opacities throughout both lungs, worsened at the right lung base, stable elsewhere. Slightly improved consolidation in the right upper lobe. Unchanged small left pleural effusion. No pneumothorax. No acute osseous abnormality. IMPRESSION: 1. Extensive multifocal pneumonia, slightly worsened at the right lung base, mildly improved in the right upper lobe. 2. Unchanged small left pleural effusion. Electronically Signed   By: Elsie ONEIDA Shoulder M.D.   On: 11/26/2023 12:21   DG CHEST PORT 1 VIEW Result Date: 11/25/2023 CLINICAL DATA:  Respiratory distress and hypotension. EXAM: PORTABLE CHEST 1 VIEW COMPARISON:  Radiographs earlier today. FINDINGS: Left central line, enteric tube, and endotracheal tubes in stable position. Progressive right midlung opacity otherwise stable multifocal right lung opacities. Worsening patchy airspace disease at the left lung base. Left pleural effusion is increasing. Background emphysema.  The heart is normal in size. No pneumothorax. IMPRESSION: 1. Progressive right mid lung and left lung base opacities. Other areas of heterogeneous airspace opacity are stable. 2. Increasing left pleural effusion. Electronically Signed   By: Andrea Gasman M.D.   On: 11/25/2023 22:30   DG CHEST PORT 1 VIEW Result Date: 11/25/2023 CLINICAL DATA:  Sepsis EXAM: PORTABLE CHEST 1 VIEW COMPARISON:  Same-day x-ray FINDINGS: Interval placement of left IJ approach central venous catheter with distal tip terminating at the level of the mid SVC. Endotracheal and enteric tubes remain in satisfactory positioning. Normal heart size. Bilateral airspace opacities, most confluent in the right mid lung. Overall, slight progression of airspace disease compared to the prior study. Hyperlucent appearance of the lung apices compatible with underlying emphysema. Eight tiny right apical pneumothorax is not excluded. No evidence of a left-sided pneumothorax. IMPRESSION: 1. Interval placement of left IJ approach central venous catheter with distal tip terminating at the level of the mid SVC. No left-sided pneumothorax. 2. Tiny right apical pneumothorax is not excluded. Attention on follow-up. 3. Bilateral airspace opacities, most confluent in the right mid lung. Overall, slight progression of airspace disease compared to the prior study. Electronically Signed   By: Mabel Converse D.O.  On: 11/25/2023 17:05   US  EKG SITE RITE Result Date: 11/25/2023 If Site Rite image not attached, placement could not be confirmed due to current cardiac rhythm.  DG Chest Port 1 View Result Date: 11/25/2023 CLINICAL DATA:  63 year old male with respiratory failure. Neck abscess. EXAM: PORTABLE CHEST 1 VIEW COMPARISON:  Chest CTA 11/23/2023 and earlier. FINDINGS: Portable AP semi upright view at 0516 hours. Large lung volumes, emphysema on CTA. Endotracheal tube tip in good position between the clavicles and carina. Enteric tube in good position,  side hole the level of the gastric body. Progressive and confluent right upper lobe opacity since 11/23/2023. Streaky bilateral lower lung opacity also stable to mildly progressed. No pneumothorax or pulmonary edema. Mediastinal contours remain normal. IMPRESSION: 1. Satisfactory ET tube and enteric tube. 2. Progressive pneumonia in the right upper lobe, ongoing bilateral lower lobe pneumonia, superimposed on emphysema. Electronically Signed   By: VEAR Hurst M.D.   On: 11/25/2023 08:51   ECHOCARDIOGRAM COMPLETE Result Date: 11/24/2023    ECHOCARDIOGRAM REPORT   Patient Name:   [name] Date of Exam: 11/24/2023 Medical Rec #:  [id number]          Height:       72.0 in Accession #:    [id number]         Weight:       113.3 lb Date of Birth:  [birth year]          BSA:          1.674 m Patient Age:    62 years           BP:           84/67 mmHg Patient Gender: M                  HR:           66 bpm. Exam Location:  Inpatient Procedure: 2D Echo, Cardiac Doppler and Color Doppler Indications:    Bacteremia; abnormal ECG  History:        Patient has no prior history of Echocardiogram examinations.                 ETOH abuse and COPD, Arrythmias:Atrial Fibrillation; Risk                 Factors:Current Smoker and Diabetes.  Sonographer:    Tillman Nora RVT RCS Referring Phys: CORDELLA JONETTA JULY  Sonographer Comments: No apical window. Image acquisition challenging due to patient body habitus and Image acquisition challenging due to COPD. IMPRESSIONS  1. Septal flattening consistent with RV volume /pressure overload. Left ventricular ejection fraction, by estimation, is 60 to 65%. The left ventricle has normal function. The left ventricle has no regional wall motion abnormalities.  2. Right ventricular systolic function is moderately reduced. The right ventricular size is moderately enlarged.  3. The mitral valve is normal in structure. Trivial mitral valve regurgitation.  4. The aortic valve is tricuspid. Aortic valve  regurgitation is not visualized.  5. The inferior vena cava is dilated in size with <50% respiratory variability, suggesting right atrial pressure of 15 mmHg. FINDINGS  Left Ventricle: Septal flattening consistent with RV volume /pressure overload. Left ventricular ejection fraction, by estimation, is 60 to 65%. The left ventricle has normal function. The left ventricle has no regional wall motion abnormalities. The left ventricular internal cavity size was normal in size. There is no left ventricular hypertrophy. Right Ventricle: The right ventricular size is moderately  enlarged. Right vetricular wall thickness was not assessed. Right ventricular systolic function is moderately reduced. Left Atrium: Left atrial size was normal in size. Right Atrium: Right atrial size was normal in size. Pericardium: Trivial pericardial effusion is present. Mitral Valve: The mitral valve is normal in structure. Trivial mitral valve regurgitation. Tricuspid Valve: The tricuspid valve is normal in structure. Tricuspid valve regurgitation is mild. Aortic Valve: The aortic valve is tricuspid. Aortic valve regurgitation is not visualized. Aortic valve mean gradient measures 2.0 mmHg. Aortic valve peak gradient measures 3.2 mmHg. Aortic valve area, by VTI measures 2.19 cm. Pulmonic Valve: The pulmonic valve was normal in structure. Pulmonic valve regurgitation is mild. Aorta: The aortic root is normal in size and structure. Venous: The inferior vena cava is dilated in size with less than 50% respiratory variability, suggesting right atrial pressure of 15 mmHg. IAS/Shunts: No atrial level shunt detected by color flow Doppler.  LEFT VENTRICLE PLAX 2D LVIDd:         3.90 cm   Diastology LVIDs:         2.40 cm   LV e' medial:    5.37 cm/s LV PW:         0.90 cm   LV E/e' medial:  14.6 LV IVS:        0.80 cm   LV e' lateral:   8.58 cm/s LVOT diam:     1.80 cm   LV E/e' lateral: 9.2 LV SV:         38 LV SV Index:   23 LVOT Area:     2.54 cm   RIGHT VENTRICLE          IVC RV Basal diam:  3.70 cm  IVC diam: 2.40 cm RV Mid diam:    3.90 cm LEFT ATRIUM             Index        RIGHT ATRIUM           Index LA diam:        2.70 cm 1.61 cm/m   RA Area:     11.90 cm LA Vol (A2C):   20.7 ml 12.37 ml/m  RA Volume:   28.60 ml  17.09 ml/m LA Vol (A4C):   26.1 ml 15.60 ml/m LA Biplane Vol: 24.1 ml 14.40 ml/m  AORTIC VALVE                    PULMONIC VALVE AV Area (Vmax):    2.29 cm     PV Vmax:          0.69 m/s AV Area (Vmean):   2.15 cm     PV Peak grad:     1.9 mmHg AV Area (VTI):     2.19 cm     PR End Diast Vel: 3.36 msec AV Vmax:           89.20 cm/s AV Vmean:          57.100 cm/s AV VTI:            0.174 m AV Peak Grad:      3.2 mmHg AV Mean Grad:      2.0 mmHg LVOT Vmax:         80.20 cm/s LVOT Vmean:        48.200 cm/s LVOT VTI:          0.150 m LVOT/AV VTI ratio: 0.86  AORTA Ao Root diam: 3.70 cm MITRAL VALVE  TRICUSPID VALVE MV Area (PHT): 3.77 cm    TR Peak grad:   36.0 mmHg MV Decel Time: 201 msec    TR Vmax:        300.00 cm/s MV E velocity: 78.60 cm/s MV A velocity: 57.60 cm/s  SHUNTS MV E/A ratio:  1.36        Systemic VTI:  0.15 m                            Systemic Diam: 1.80 cm Vina Gull MD Electronically signed by Vina Gull MD Signature Date/Time: 11/24/2023/2:39:35 PM    Final     ROS Blood pressure 109/81, pulse 70, temperature 100.2 F (37.9 C), resp. rate 20, height 6' (1.829 m), weight 58 kg, SpO2 100%. General appearance: Intubated, sedated on vent support Head: Normocephalic, without obvious abnormality, atraumatic Throat: Oral ETT present, incomplete exam. Anterior lower teeth with cervical caries. Unable to visualize posterior teeth. Neck: Open incision with gauze. No purulence visualized.  Assessment/Plan: Multiple carious lower teeth with abscess noted on CTlower right premolars and molar. Plan full mouth extractions under general anesthesia.   Glendia Primrose 11/26/2023, 12:26 PM

## 2023-11-26 NOTE — Progress Notes (Signed)
 eLink Physician-Brief Progress Note Patient Name: NEILAN RIZZO DOB: Jun 28, 1961 MRN: [id number]   Date of Service  11/26/2023  HPI/Events of Note  Notified of platelets 29 from 46 No report of active bleed  eICU Interventions  Likely secondary to sepsis in the background of hepatic insufficiency Refer if with signs of bleed     Intervention Category Intermediate Interventions: Other:  Damien ONEIDA Grout 11/26/2023, 5:44 AM

## 2023-11-26 NOTE — Plan of Care (Signed)
  Problem: Metabolic: Goal: Ability to maintain appropriate glucose levels will improve Outcome: Progressing   Problem: Nutritional: Goal: Maintenance of adequate nutrition will improve Outcome: Progressing Goal: Progress toward achieving an optimal weight will improve Outcome: Progressing   Problem: Tissue Perfusion: Goal: Adequacy of tissue perfusion will improve Outcome: Progressing   Problem: Fluid Volume: Goal: Hemodynamic stability will improve Outcome: Progressing

## 2023-11-26 NOTE — Progress Notes (Signed)
 [facility]   Reason for visit: Follow up on neck abscess status post I&D.  Interval History: No positive cultures from the neck abscess, blood cultures with Enterococcus faecalis, Pseudomonas in the respiratory culture   Physical Exam: Constitutional:  Vitals:   11/26/23 1500 11/26/23 1600  BP: 99/77 104/83  Pulse: 72 80  Resp: (!) 23 20  Temp: 100 F (37.8 C) (!) 100.6 F (38.1 C)  SpO2: 99% 100%   patient appears in NAD Eyes: anicteric HENT: +ETT Respiratory: Respiratory effort on the vent  Review of Systems: Unable to be assessed due to patient factors  Lab Results  Component Value Date   WBC 25.9 (H) 11/26/2023   HGB 13.4 11/26/2023   HCT 42.0 11/26/2023   MCV 93.1 11/26/2023   PLT 29 (LL) 11/26/2023    Lab Results  Component Value Date   CREATININE 0.51 (L) 11/26/2023   BUN 27 (H) 11/26/2023   NA 140 11/26/2023   K 3.6 11/26/2023   CL 93 (L) 11/26/2023   CO2 36 (H) 11/26/2023    Lab Results  Component Value Date   ALT 687 (H) 11/26/2023   AST 425 (H) 11/26/2023   ALKPHOS 320 (H) 11/26/2023     Microbiology: Recent Results (from the past 240 hours)  Blood culture (routine x 2)     Status: None   Collection Time: 11/19/23 10:50 PM   Specimen: BLOOD RIGHT FOREARM  Result Value Ref Range Status   Specimen Description BLOOD RIGHT FOREARM  Final   Special Requests   Final    BOTTLES DRAWN AEROBIC AND ANAEROBIC Blood Culture results may not be optimal due to an inadequate volume of blood received in culture bottles   Culture   Final    NO GROWTH 5 DAYS Performed at [facility], [address] [place], [place] [address]    Report Status 11/24/2023 FINAL  Final  Blood culture (routine x 2)     Status: None   Collection Time: 11/19/23 11:00 PM   Specimen: BLOOD  Result Value Ref Range Status   Specimen Description BLOOD RIGHT ANTECUBITAL  Final   Special Requests   Final    BOTTLES DRAWN AEROBIC AND ANAEROBIC  Blood Culture results may not be optimal due to an inadequate volume of blood received in culture bottles   Culture   Final    NO GROWTH 5 DAYS Performed at [facility], [address], [place], [place] [address]    Report Status 11/24/2023 FINAL  Final  Resp panel by RT-PCR (RSV, Flu A&B, Covid) Anterior Nasal Swab     Status: None   Collection Time: 11/19/23 11:10 PM   Specimen: Anterior Nasal Swab  Result Value Ref Range Status   SARS Coronavirus 2 by RT PCR NEGATIVE NEGATIVE Final   Influenza A by PCR NEGATIVE NEGATIVE Final   Influenza B by PCR NEGATIVE NEGATIVE Final    Comment: (NOTE) The Xpert Xpress SARS-CoV-2/FLU/RSV plus assay is intended as an aid in the diagnosis of influenza from Nasopharyngeal swab specimens and should not be used as a sole basis for treatment. Nasal washings and aspirates are unacceptable for Xpert Xpress SARS-CoV-2/FLU/RSV testing.  Fact Sheet for Patients: bloggercourse.com  Fact Sheet for Healthcare Providers: seriousbroker.it  This test is not yet approved or cleared by the [place]  FDA and has been authorized for detection and/or diagnosis of SARS-CoV-2 by FDA under an Emergency Use Authorization (EUA). This EUA  will remain in effect (meaning this test can be used) for the duration of the COVID-19 declaration under Section 564(b)(1) of the Act, 21 U.S.C. section 360bbb-3(b)(1), unless the authorization is terminated or revoked.     Resp Syncytial Virus by PCR NEGATIVE NEGATIVE Final    Comment: (NOTE) Fact Sheet for Patients: bloggercourse.com  Fact Sheet for Healthcare Providers: seriousbroker.it  This test is not yet approved or cleared by the [place]  FDA and has been authorized for detection and/or diagnosis of SARS-CoV-2 by FDA under an Emergency Use Authorization (EUA). This EUA will remain in effect (meaning this  test can be used) for the duration of the COVID-19 declaration under Section 564(b)(1) of the Act, 21 U.S.C. section 360bbb-3(b)(1), unless the authorization is terminated or revoked.  Performed at [facility], [address], [place], [place] [address]   Culture, blood (routine x 2) Call MD if unable to obtain prior to antibiotics being given     Status: Abnormal   Collection Time: 11/20/23  1:40 AM   Specimen: BLOOD  Result Value Ref Range Status   Specimen Description BLOOD RIGHT ANTECUBITAL  Final   Special Requests   Final    BOTTLES DRAWN AEROBIC AND ANAEROBIC Blood Culture results may not be optimal due to an inadequate volume of blood received in culture bottles   Culture  Setup Time   Final    GRAM POSITIVE COCCI IN CHAINS AEROBIC BOTTLE ONLY CRITICAL RESULT CALLED TO, READ BACK BY AND VERIFIED WITH: MAYA HARBOUR, A [id number] FCP Performed at [facility], [address], [place], [place] [address]    Culture ENTEROCOCCUS FAECALIS (A)  Final   Report Status 11/26/2023 FINAL  Final   Organism ID, Bacteria ENTEROCOCCUS FAECALIS  Final      Susceptibility   Enterococcus faecalis - MIC*    AMPICILLIN  <=2 SENSITIVE Sensitive     VANCOMYCIN  1 SENSITIVE Sensitive     GENTAMICIN SYNERGY SENSITIVE Sensitive     * ENTEROCOCCUS FAECALIS  Blood Culture ID Panel (Reflexed)     Status: Abnormal   Collection Time: 11/20/23  1:40 AM  Result Value Ref Range Status   Enterococcus faecalis DETECTED (A) NOT DETECTED Final   Enterococcus Faecium NOT DETECTED NOT DETECTED Final    Comment: CRITICAL RESULT CALLED TO, READ BACK BY AND VERIFIED WITH: PHARMD MYER, [id number] FCP    Listeria monocytogenes NOT DETECTED NOT DETECTED Final   Staphylococcus species NOT DETECTED NOT DETECTED Final   Staphylococcus aureus (BCID) NOT DETECTED NOT DETECTED Final   Staphylococcus epidermidis NOT DETECTED NOT DETECTED Final   Staphylococcus lugdunensis NOT DETECTED NOT DETECTED Final    Streptococcus species NOT DETECTED NOT DETECTED Final   Streptococcus agalactiae NOT DETECTED NOT DETECTED Final   Streptococcus pneumoniae NOT DETECTED NOT DETECTED Final   Streptococcus pyogenes NOT DETECTED NOT DETECTED Final   A.calcoaceticus-baumannii NOT DETECTED NOT DETECTED Final   Bacteroides fragilis NOT DETECTED NOT DETECTED Final   Enterobacterales NOT DETECTED NOT DETECTED Final   Enterobacter cloacae complex NOT DETECTED NOT DETECTED Final   Escherichia coli NOT DETECTED NOT DETECTED Final   Klebsiella aerogenes NOT DETECTED NOT DETECTED Final   Klebsiella oxytoca NOT DETECTED NOT DETECTED Final   Klebsiella pneumoniae NOT DETECTED NOT DETECTED Final   Proteus species NOT DETECTED NOT DETECTED Final   Salmonella species NOT DETECTED NOT DETECTED Final   Serratia marcescens NOT DETECTED NOT DETECTED Final   Haemophilus influenzae NOT DETECTED NOT  DETECTED Final   Neisseria meningitidis NOT DETECTED NOT DETECTED Final   Pseudomonas aeruginosa NOT DETECTED NOT DETECTED Final   Stenotrophomonas maltophilia NOT DETECTED NOT DETECTED Final   Candida albicans NOT DETECTED NOT DETECTED Final   Candida auris NOT DETECTED NOT DETECTED Final   Candida glabrata NOT DETECTED NOT DETECTED Final   Candida krusei NOT DETECTED NOT DETECTED Final   Candida parapsilosis NOT DETECTED NOT DETECTED Final   Candida tropicalis NOT DETECTED NOT DETECTED Final   Cryptococcus neoformans/gattii NOT DETECTED NOT DETECTED Final   Vancomycin  resistance NOT DETECTED NOT DETECTED Final    Comment: Performed at [facility], [address] [place], [place] [address]  Culture, blood (routine x 2) Call MD if unable to obtain prior to antibiotics being given     Status: None   Collection Time: 11/20/23  1:55 AM   Specimen: BLOOD  Result Value Ref Range Status   Specimen Description BLOOD LEFT ANTECUBITAL  Final   Special Requests   Final    BOTTLES DRAWN AEROBIC AND ANAEROBIC Blood Culture  results may not be optimal due to an inadequate volume of blood received in culture bottles   Culture   Final    NO GROWTH 5 DAYS Performed at [facility], [address], [place], [place] [address]    Report Status 11/25/2023 FINAL  Final  MRSA Next Gen by PCR, Nasal     Status: None   Collection Time: 11/20/23  7:00 AM   Specimen: Nasal Mucosa; Nasal Swab  Result Value Ref Range Status   MRSA by PCR Next Gen NOT DETECTED NOT DETECTED Final    Comment: (NOTE) The GeneXpert MRSA Assay (FDA approved for NASAL specimens only), is one component of a comprehensive MRSA colonization surveillance program. It is not intended to diagnose MRSA infection nor to guide or monitor treatment for MRSA infections. Test performance is not FDA approved in patients less than 93 years old. Performed at [facility], [address], [place], [place] [address]   Aerobic/Anaerobic Culture w Gram Stain (surgical/deep wound)     Status: Abnormal (Preliminary result)   Collection Time: 11/22/23 12:04 PM   Specimen: Abscess  Result Value Ref Range Status   Specimen Description ABSCESS  Final   Special Requests NECK ABSCESS  Final   Gram Stain   Final    ABUNDANT WBC PRESENT, PREDOMINANTLY PMN FEW GRAM POSITIVE COCCI IN PAIRS Performed at [facility], [address], [place], [place] [address]    Culture (A)  Final    MULTIPLE ORGANISMS PRESENT, NONE PREDOMINANT NO ANAEROBES ISOLATED; CULTURE IN PROGRESS FOR 5 DAYS    Report Status PENDING  Incomplete  Expectorated Sputum Assessment w Gram Stain, Rflx to Resp Cult     Status: None   Collection Time: 11/23/23  2:25 AM   Specimen: Expectorated Sputum  Result Value Ref Range Status   Specimen Description EXPECTORATED SPUTUM  Final   Special Requests Normal  Final   Sputum evaluation   Final    THIS SPECIMEN IS ACCEPTABLE FOR SPUTUM CULTURE Performed at [facility], [address], [place], [place] [address]    Report Status  11/23/2023 FINAL  Final  Culture, Respiratory w Gram Stain     Status: None   Collection Time: 11/23/23  2:25 AM  Result Value Ref Range Status   Specimen Description EXPECTORATED SPUTUM  Final   Special Requests Normal Reflexed from [id number]  Final   Gram Stain   Final    MODERATE WBC PRESENT, PREDOMINANTLY PMN MODERATE GRAM NEGATIVE RODS RARE YEAST WITH PSEUDOHYPHAE Performed at [facility], [address], [place], [place] [address]    Culture ABUNDANT PSEUDOMONAS AERUGINOSA  Final   Report Status 11/25/2023 FINAL  Final   Organism ID, Bacteria PSEUDOMONAS AERUGINOSA  Final      Susceptibility   Pseudomonas aeruginosa - MIC*    CEFTAZIDIME 8 SENSITIVE Sensitive     CIPROFLOXACIN  <=0.25 SENSITIVE Sensitive     GENTAMICIN <=1 SENSITIVE Sensitive     IMIPENEM 1 SENSITIVE Sensitive     PIP/TAZO 16 SENSITIVE Sensitive ug/mL    CEFEPIME  2 SENSITIVE Sensitive     * ABUNDANT PSEUDOMONAS AERUGINOSA  Culture, blood (Routine X 2) w Reflex to ID Panel     Status: None (Preliminary result)   Collection Time: 11/24/23  5:09 AM   Specimen: BLOOD RIGHT HAND  Result Value Ref Range Status   Specimen Description BLOOD RIGHT HAND  Final   Special Requests   Final    BOTTLES DRAWN AEROBIC AND ANAEROBIC Blood Culture results may not be optimal due to an inadequate volume of blood received in culture bottles   Culture   Final    NO GROWTH 2 DAYS Performed at [facility], [address], [place], [place] [address]    Report Status PENDING  Incomplete  Culture, blood (Routine X 2) w Reflex to ID Panel     Status: None (Preliminary result)   Collection Time: 11/24/23  5:09 AM   Specimen: BLOOD RIGHT ARM  Result Value Ref Range Status   Specimen Description BLOOD RIGHT ARM  Final   Special Requests   Final    BOTTLES DRAWN AEROBIC AND ANAEROBIC Blood Culture results may not be optimal due to an inadequate volume of blood received in culture bottles   Culture   Final    NO GROWTH 2  DAYS Performed at [facility], [address] [place], [place] [address]    Report Status PENDING  Incomplete    Impression/Plan:  1.  Neck abscess.  This is a submandibular abscess likely secondary to otological source with poor dentition throughout.  He remains on antibiotics and will change to piperacillin /tazobactam to cover both the Enterococcus, potential other organisms in the area and also the Pseudomonas.  Will continue with antibiotics now  2.  Poor dentition.  He has caries and abscess and plan noted from oral surgery for full mouth extraction of his teeth under general anesthesia tomorrow.  3.  Leukocytosis.  White blood cell count has declined to 25.9 and he has received steroids.  Will continue to monitor.

## 2023-11-26 NOTE — Progress Notes (Signed)
 NAME:  [name], MRN:  [id number], DOB:  1961/03/07, LOS: 6 ADMISSION DATE:  11/19/2023, CONSULTATION DATE:  11/22/23 REFERRING MD:  Juvenal, CHIEF COMPLAINT:  SOB   History of Present Illness:  63 year old man w/ hx of EtOH abuse, COPD on O2 at night, recurrent PNA probable aspiration presenting with lethargy and neck swelling.  Found to be in severe sepsis.  Imaging reveals a large necrotic gas-filled abscess extending from submandibular space to manubrium.  Worsening oxygenation and secretions overnights so PCCM consulted.  Talked to mother on phone: FTT for past few months and food aversion as well as unintentional weight loss, diarrhea.  Does not think he's been drinking.  Pertinent  Medical History  Alcohol  abuse Recurrent aspiration COPD followed by [facility] Severe protein calorie malnutrition POA  Significant Hospital Events: Including procedures, antibiotic start and stop dates in addition to other pertinent events   1/7 admit - blood cx positive e. faecalis 1/9 worsening agitation, O2 needs s/p ID RT neck abscess 1/10 remains intubated, s/p ID 1/11 progressive worsening of septic shock 1/12 started cipro  for psa in sputum.   Interim History / Subjective:   Worsening ventilator settings overnight. Given lasix . Now on 100% FiO2, 10 peep. Febrile yesterday morning, low grade since then. This morning awake, alert, endorses discomfort.   Objective   Blood pressure 100/73, pulse 68, temperature 100.2 F (37.9 C), temperature source Bladder, resp. rate 16, height 6' (1.829 m), weight 58 kg, SpO2 95%.    Vent Mode: PRVC FiO2 (%):  [50 %-100 %] 100 % Set Rate:  [14 bmp-20 bmp] 20 bmp Vt Set:  [540 mL-620 mL] 540 mL PEEP:  [10 cmH20-12 cmH20] 12 cmH20 Plateau Pressure:  [20 cmH20-25 cmH20] 24 cmH20   Intake/Output Summary (Last 24 hours) at 11/26/2023 1021 Last data filed at 11/26/2023 0930 Gross per 24 hour  Intake 5865.77 ml  Output 2445 ml  Net 3420.77 ml   Filed  Weights   11/24/23 0500 11/25/23 0403 11/26/23 0400  Weight: 51.4 kg 54 kg 58 kg   PHYSICAL EXAMINATION: Gen:      Intubated, sedated, acutely ill appearing HEENT:  ETT to vent Lungs:    sounds of mechanical ventilation auscultated, no wheeze, thick secretions CV:         RRR no mrg Abd:      + bowel sounds; soft, non-tender; no palpable masses, no distension Ext:   Upper extremities edematous Skin:      Warm and dry; no rashes Neuro:   sedated, RASS -1, moves all 4 extremities   Na 140 K 3.6 CO2 36 Cr 0.51 AST 425 ALT 687 T bili 3.2 Platelets 29 WBC 25.9 Hgb 13.4  Resolved Hospital Problem list   N/A  Assessment & Plan:  [name] is a 63 y.o. man with alcohol  and tobacco use disorder who presented with cough and shortness of breath found to have:   Acute hypoxic, hypercarbic respiratory failure HAP - sputum cx positive for PSA COPD Volume overload - 12L positive He is positive for urine strep and Enterococcus in blood, sputum cx positive for PSA Repeat blood cultures negative on 1/11.  Continue unasyn , increasing cipro  to q8 dosing for pneumonia Lung protective ventilation VAP bundle Pulmonary hygiene Will need further diuresis  Septic shock secondary to HAP Submandibular abscess with odontologic source Status post incision and drainage by ENT on 1/9 Continue cipro , unasyn  I spent some time talking with transfer centers at [facility] (they do  not have oral surgeon,) and spoke with oral surgeon at [facility]. Finally did discuss with Dr. Sheryle locally. Plan for dental examination and extraction on 12/14/2023.   Acute metabolic encephalopathy due to sepsis Supportive care, monitor  Hyponatremia Stop D5, continue Free H20  Hyperglycemia due to dextrose  drip Increase SSI to moderate scale  Severe protein calorie malnutrition POA Failure to thrive Tube feeds  Thrombocytopenia - due to sepsis - will need platelets>50 tomorrow for OR.   Best Practice (right click and  Reselect all SmartList Selections daily)   Diet/type: tubefeeds DVT prophylaxis holding due to thrombocytopenia Pressure ulcer(s): skin tear arm GI prophylaxis: PPI Lines: N/A Foley:  Yes, and it is still needed Code Status:  full code. Mother updated 12/14/23 by phone .  Critical care time:   The patient is critically ill due to septic shock.  Critical care was necessary to treat or prevent imminent or life-threatening deterioration.  Critical care was time spent personally by me on the following activities: development of treatment plan with patient and/or surrogate as well as nursing, discussions with consultants, evaluation of patient's response to treatment, examination of patient, obtaining history from patient or surrogate, ordering and performing treatments and interventions, ordering and review of laboratory studies, ordering and review of radiographic studies, pulse oximetry, re-evaluation of patient's condition and participation in multidisciplinary rounds.   Critical Care Time devoted to patient care services described in this note is 80 minutes. This time reflects time of care of this signee Lailee Hoelzel S Empress [place]mann . This critical care time does not reflect separately billable procedures or procedure time, teaching time or supervisory time of PA/NP/Med student/Med Resident etc but could involve care discussion time.       Verdon GORMAN Gore [place] [facility] 11/26/2023 10:43 AM  Pager: see AMION  If no response to pager , please call critical care on call (see AMION) until 7pm After 7:00 pm call Elink

## 2023-11-26 NOTE — Progress Notes (Signed)
 PT Cancellation Note  Patient Details Name: [name] MRN: [id number] DOB: [birth year]   Cancelled Treatment:    Reason Eval/Treat Not Completed: Fatigue/lethargy limiting ability to participate  Spoke with RN, pt still intubated on vent awaiting oral surgeon consult s/p submental I&D. Notes indicate pt responding to tactile stimulus this morning. Will hold one more day for PT follow-up. If medical team feels pt would benefit from PT follow-up sooner and can tolerate treatment, please secure chat myself or [facility] PT. Otherwise will check his status tomorrow.  Leontine Roads, PT, DPT [facility]  Rehabilitation Services Physical Therapist Office: [phone] Website: [email or web address]   Leontine GORMAN Roads 11/26/2023, 10:31 AM

## 2023-11-26 NOTE — Progress Notes (Signed)
 eLink Physician-Brief Progress Note Patient Name: [name] DOB: 10/07/1961 MRN: [id number]   Date of Service  11/26/2023  HPI/Events of Note  63 year old man w/ hx of EtOH abuse, COPD on O2 at night, recurrent PNA probable aspiration presenting with lethargy and neck swelling. Found to be in severe sepsis. Imaging reveals a large necrotic gas-filled abscess extending from submandibular space to manubrium.  Ventilated  eICU Interventions  Renew restraints for patient safety     Intervention Category Minor Interventions: Routine modifications to care plan (e.g. PRN medications for pain, fever)  Raniya Golembeski 11/26/2023, 9:37 PM

## 2023-11-26 NOTE — Progress Notes (Signed)
 [facility] ADULT ICU REPLACEMENT PROTOCOL   The patient does apply for the [facility] Adult ICU Electrolyte Replacment Protocol based on the criteria listed below:   1.Exclusion criteria: TCTS, ECMO, Dialysis, and Myasthenia Gravis patients 2. Is GFR >/= 30 ml/min? Yes.    Patient's GFR today is >60 3. Is SCr </= 2? Yes.   Patient's SCr is 0.51 mg/dL 4. Did SCr increase >/= 0.5 in 24 hours? No. 5.Pt's weight >40kg  Yes.   6. Abnormal electrolyte(s): K  7. Electrolytes replaced per protocol 8.  Call MD STAT for K+ </= 2.5, Phos </= 1, or Mag </= 1 Physician:  Marion Hunter BRAVO Yaris Ferrell 11/26/2023 5:18 AM

## 2023-11-27 ENCOUNTER — Inpatient Hospital Stay (HOSPITAL_COMMUNITY): Payer: Medicaid Other

## 2023-11-27 ENCOUNTER — Inpatient Hospital Stay (HOSPITAL_COMMUNITY): Payer: Self-pay | Admitting: Certified Registered"

## 2023-11-27 ENCOUNTER — Encounter: Payer: Self-pay | Admitting: Pulmonary Disease

## 2023-11-27 ENCOUNTER — Encounter (HOSPITAL_COMMUNITY): Admission: EM | Disposition: E | Payer: Self-pay | Source: Home / Self Care | Attending: Pulmonary Disease

## 2023-11-27 DIAGNOSIS — R7881 Bacteremia: Secondary | ICD-10-CM | POA: Diagnosis not present

## 2023-11-27 DIAGNOSIS — B952 Enterococcus as the cause of diseases classified elsewhere: Secondary | ICD-10-CM | POA: Diagnosis not present

## 2023-11-27 LAB — CBC
HCT: 38.2 % — ABNORMAL LOW (ref 39.0–52.0)
Hemoglobin: 12.2 g/dL — ABNORMAL LOW (ref 13.0–17.0)
MCH: 29.3 pg (ref 26.0–34.0)
MCHC: 31.9 g/dL (ref 30.0–36.0)
MCV: 91.8 fL (ref 80.0–100.0)
Platelets: 30 10*3/uL — ABNORMAL LOW (ref 150–400)
RBC: 4.16 MIL/uL — ABNORMAL LOW (ref 4.22–5.81)
RDW: 16.4 % — ABNORMAL HIGH (ref 11.5–15.5)
WBC: 19.2 10*3/uL — ABNORMAL HIGH (ref 4.0–10.5)
nRBC: 0.1 % (ref 0.0–0.2)

## 2023-11-27 LAB — POCT I-STAT 7, (LYTES, BLD GAS, ICA,H+H)
Acid-Base Excess: 11 mmol/L — ABNORMAL HIGH (ref 0.0–2.0)
Bicarbonate: 39.3 mmol/L — ABNORMAL HIGH (ref 20.0–28.0)
Calcium, Ion: 1.03 mmol/L — ABNORMAL LOW (ref 1.15–1.40)
HCT: 40 % (ref 39.0–52.0)
Hemoglobin: 13.6 g/dL (ref 13.0–17.0)
O2 Saturation: 98 %
Patient temperature: 99.2
Potassium: 4.2 mmol/L (ref 3.5–5.1)
Sodium: 143 mmol/L (ref 135–145)
TCO2: 41 mmol/L — ABNORMAL HIGH (ref 22–32)
pCO2 arterial: 67.8 mm[Hg] (ref 32–48)
pH, Arterial: 7.373 (ref 7.35–7.45)
pO2, Arterial: 107 mm[Hg] (ref 83–108)

## 2023-11-27 LAB — COMPREHENSIVE METABOLIC PANEL
ALT: 323 U/L — ABNORMAL HIGH (ref 0–44)
AST: 56 U/L — ABNORMAL HIGH (ref 15–41)
Albumin: 1.5 g/dL — ABNORMAL LOW (ref 3.5–5.0)
Alkaline Phosphatase: 210 U/L — ABNORMAL HIGH (ref 38–126)
Anion gap: 11 (ref 5–15)
BUN: 36 mg/dL — ABNORMAL HIGH (ref 8–23)
CO2: 36 mmol/L — ABNORMAL HIGH (ref 22–32)
Calcium: 7.5 mg/dL — ABNORMAL LOW (ref 8.9–10.3)
Chloride: 96 mmol/L — ABNORMAL LOW (ref 98–111)
Creatinine, Ser: 0.48 mg/dL — ABNORMAL LOW (ref 0.61–1.24)
GFR, Estimated: >60 mL/min (ref >60)
Glucose, Bld: 137 mg/dL — ABNORMAL HIGH (ref 70–99)
Potassium: 3.8 mmol/L (ref 3.5–5.1)
Sodium: 143 mmol/L (ref 135–145)
Total Bilirubin: 3 mg/dL — ABNORMAL HIGH (ref 0.0–1.2)
Total Protein: 4.7 g/dL — ABNORMAL LOW (ref 6.5–8.1)

## 2023-11-27 LAB — GLUCOSE, CAPILLARY
Glucose-Capillary: 122 mg/dL — ABNORMAL HIGH (ref 70–99)
Glucose-Capillary: 66 mg/dL — ABNORMAL LOW (ref 70–99)
Glucose-Capillary: 114 mg/dL — ABNORMAL HIGH (ref 70–99)
Glucose-Capillary: 116 mg/dL — ABNORMAL HIGH (ref 70–99)

## 2023-11-27 LAB — AEROBIC/ANAEROBIC CULTURE W GRAM STAIN (SURGICAL/DEEP WOUND)

## 2023-11-27 LAB — BLOOD GAS, VENOUS
Acid-Base Excess: 14.9 mmol/L — ABNORMAL HIGH (ref 0.0–2.0)
Bicarbonate: 43.8 mmol/L — ABNORMAL HIGH (ref 20.0–28.0)
O2 Saturation: 63.7 %
Patient temperature: 37.3
pCO2, Ven: 75 mm[Hg] (ref 44–60)
pH, Ven: 7.38 (ref 7.25–7.43)
pO2, Ven: 42 mm[Hg] (ref 32–45)

## 2023-11-27 SURGERY — DENTAL RESTORATION/EXTRACTIONS
Anesthesia: General | Laterality: Right

## 2023-11-27 MED ORDER — ORAL CARE MOUTH RINSE
15.0000 mL | OROMUCOSAL | Status: DC | PRN
Start: 2023-11-27 — End: 2023-11-27

## 2023-11-27 MED ORDER — LIDOCAINE 2% (20 MG/ML) 5 ML SYRINGE
INTRAMUSCULAR | Status: AC
  Filled 2023-11-27: qty 5

## 2023-11-27 MED ORDER — ROCURONIUM BROMIDE 10 MG/ML (PF) SYRINGE
PREFILLED_SYRINGE | INTRAVENOUS | Status: AC
  Filled 2023-11-27: qty 10

## 2023-11-27 MED ORDER — EPHEDRINE 5 MG/ML INJ
INTRAVENOUS | Status: AC
  Filled 2023-11-27: qty 5

## 2023-11-27 MED ORDER — ZINC OXIDE 40 % EX OINT
TOPICAL_OINTMENT | Freq: Two times a day (BID) | CUTANEOUS | Status: DC
  Administered 2023-11-27: 1 via TOPICAL
  Filled 2023-11-27: qty 57

## 2023-11-27 MED ORDER — FENTANYL BOLUS VIA INFUSION
100.0000 ug | INTRAVENOUS | Status: DC | PRN
  Administered 2023-11-27 (×3): 100 ug via INTRAVENOUS

## 2023-11-27 MED ORDER — DEXAMETHASONE SODIUM PHOSPHATE 10 MG/ML IJ SOLN
INTRAMUSCULAR | Status: AC
Start: 2023-11-27 — End: ?
  Filled 2023-11-27: qty 1

## 2023-11-27 MED ORDER — GLYCOPYRROLATE 0.2 MG/ML IJ SOLN: 0.2000 mg | INTRAMUSCULAR | Status: DC | PRN

## 2023-11-27 MED ORDER — PHENYLEPHRINE 80 MCG/ML (10ML) SYRINGE FOR IV PUSH (FOR BLOOD PRESSURE SUPPORT)
PREFILLED_SYRINGE | INTRAVENOUS | Status: AC
  Filled 2023-11-27: qty 10

## 2023-11-27 MED ORDER — ORAL CARE MOUTH RINSE
15.0000 mL | OROMUCOSAL | Status: DC
  Administered 2023-11-27 (×4): 15 mL via OROMUCOSAL

## 2023-11-27 MED ORDER — FENTANYL 2500MCG IN NS 250ML (10MCG/ML) PREMIX INFUSION
0.0000 ug/h | INTRAVENOUS | Status: DC
Start: 2023-11-27 — End: 2023-11-28

## 2023-11-27 MED ORDER — POLYVINYL ALCOHOL 1.4 % OP SOLN: 1.0000 [drp] | Freq: Four times a day (QID) | OPHTHALMIC | Status: DC | PRN

## 2023-11-27 MED ORDER — PROPOFOL 10 MG/ML IV BOLUS
INTRAVENOUS | Status: AC
  Filled 2023-11-27: qty 20

## 2023-11-27 MED ORDER — MIDAZOLAM HCL 2 MG/2ML IJ SOLN: 2.0000 mg | INTRAMUSCULAR | Status: DC | PRN

## 2023-11-27 MED ORDER — ACETAMINOPHEN 650 MG RE SUPP: 650.0000 mg | Freq: Four times a day (QID) | RECTAL | Status: DC | PRN

## 2023-11-27 MED ORDER — GLYCOPYRROLATE 1 MG PO TABS
1.0000 mg | ORAL_TABLET | ORAL | Status: DC | PRN
Start: 2023-11-27 — End: 2023-11-28

## 2023-11-27 MED ORDER — ONDANSETRON HCL 4 MG/2ML IJ SOLN
INTRAMUSCULAR | Status: AC
  Filled 2023-11-27: qty 2

## 2023-11-27 MED ORDER — VASOPRESSIN 20 UNIT/ML IV SOLN
INTRAVENOUS | Status: AC
  Filled 2023-11-27: qty 1

## 2023-11-28 ENCOUNTER — Telehealth: Payer: Self-pay | Admitting: Internal Medicine

## 2023-11-28 NOTE — Telephone Encounter (Signed)
 Phone call placed to patient's mother this morning Ms. [name].  I extended my condolences to her and her family.  She thanked me for calling and extended her gratitude for me taking care of him.

## 2023-11-29 LAB — CULTURE, BLOOD (ROUTINE X 2)
Culture: NO GROWTH
Culture: NO GROWTH

## 2023-12-15 NOTE — Procedures (Signed)
 Arterial Catheter Insertion Procedure Note  [name]  [id number]  1961-05-29  Date:Dec 16, 2023  Time:10:47 AM    Provider Performing: Fonda JAYSON Sharps    Procedure: Insertion of Arterial Line (63379) with US  guidance (23062)   Indication(s) Blood pressure monitoring and/or need for frequent ABGs  Consent Risks of the procedure as well as the alternatives and risks of each were explained to the patient and/or caregiver.  Consent for the procedure was obtained and is signed in the bedside chart, discussed with patient's mother   Anesthesia None   Time Out Verified patient identification, verified procedure, site/side was marked, verified correct patient position, special equipment/implants available, medications/allergies/relevant history reviewed, required imaging and test results available.   Sterile Technique Maximal sterile technique including full sterile barrier drape, hand hygiene, sterile gown, sterile gloves, mask, hair covering, sterile ultrasound probe cover (if used).   Procedure Description Area of catheter insertion was cleaned with chlorhexidine  and draped in sterile fashion. With real-time ultrasound guidance an arterial catheter was placed into the right femoral artery.  Appropriate arterial tracings confirmed on monitor.     Complications/Tolerance None; patient tolerated the procedure well.   EBL Minimal   Specimen(s) None   Sherlean Sharps AGACNP-BC   [place] [facility] 12-16-23, 10:48 AM  Please see [email or web address] for pager details.  From 7A-7P if no response, please call [phone]. After hours, please call ELink [phone].

## 2023-12-15 NOTE — Procedures (Signed)
 Extubation Procedure Note  Patient Details:   Name: [name] DOB: June 01, 1961 MRN: [id number]   Airway Documentation:    Vent end date: 2023/11/30 Vent end time: 1555   Evaluation  O2 sats: transiently fell during during procedure and currently acceptable Complications: No apparent complications Patient did tolerate procedure well. Bilateral Breath Sounds: Diminished, Clear   Yes  Pt extubated to comfort care measures per physician order and family request. Pt suctioned via ETT/orally prior. Pt extubated to room air and NG tube removed as well. Family remains at pt bedside. RT will continue to be available as needed.     Geofm FORBES Batty 30-Nov-2023, 3:58 PM

## 2023-12-15 NOTE — Progress Notes (Signed)
 NAME:  [name], MRN:  [id number], DOB:  Mar 08, 1961, LOS: 7 ADMISSION DATE:  11/19/2023, CONSULTATION DATE:  11/22/23 REFERRING MD:  Juvenal, CHIEF COMPLAINT:  SOB   History of Present Illness:  63 year old man w/ hx of EtOH abuse, COPD on O2 at night, recurrent PNA probable aspiration presenting with lethargy and neck swelling.  Found to be in severe sepsis.  Imaging reveals a large necrotic gas-filled abscess extending from submandibular space to manubrium.  Worsening oxygenation and secretions overnights so PCCM consulted.  Talked to mother on phone: FTT for past few months and food aversion as well as unintentional weight loss, diarrhea.  Does not think he's been drinking.  Pertinent  Medical History  Alcohol  abuse Recurrent aspiration COPD followed by [facility] Severe protein calorie malnutrition POA  Significant Hospital Events: Including procedures, antibiotic start and stop dates in addition to other pertinent events   1/7 admit - blood cx positive e. faecalis 1/9 worsening agitation, O2 needs s/p ID RT neck abscess 1/10 remains intubated, s/p ID 1/11 progressive worsening of septic shock 1/12 started cipro  for psa in sputum.  12-04-23 Worsening pressor requirements  Interim History / Subjective:  Critical ill on vent,increasing pressor requirements   Objective   Blood pressure 105/78, pulse 78, temperature 99.2 F (37.3 C), temperature source Axillary, resp. rate (!) 28, height 6' (1.829 m), weight 58 kg, SpO2 96%.    Vent Mode: PRVC FiO2 (%):  [80 %-100 %] 100 % Set Rate:  [20 bmp] 20 bmp Vt Set:  [540 mL] 540 mL PEEP:  [12 cmH20] 12 cmH20 Plateau Pressure:  [22 cmH20-27 cmH20] 27 cmH20   Intake/Output Summary (Last 24 hours) at 12-04-2023 0930 Last data filed at 04-Dec-2023 0800 Gross per 24 hour  Intake 2174.69 ml  Output 1555 ml  Net 619.69 ml   Filed Weights   11/25/23 0403 11/26/23 0400 12/04/2023 0500  Weight: 54 kg 58 kg 58 kg   PHYSICAL EXAMINATION: Gen:   chronically ill appearing male on vent  HEENT: Normocephalic,  Lungs:  Rhonchi, diminished throughout lungs, on vent  CV:   s1,s2, ST, no MRG, no JVD  Abd:  bowel sounds hypoactive, soft Ext: edematous, moves lower extremities to pain  Skin: cool upper and lower extremities  Neuro: RASS -1 to -2, not following commands   Resolved Hospital Problem list   N/A  Assessment & Plan:    Acute hypoxic, hypercarbic respiratory failure HAP - sputum cx positive for PSA COPD Volume overload - 12L positive positive for urine strep and Enterococcus in blood, sputum cx positive for PSA Repeat blood cultures negative on 1/11.  Chest x-ray: bilateral infiltrates L>R  P:  Continue VAP bundle  Continue unasyn  and ciprofloxacin   Continue VAP and PAD bundle  Hold off diuresis due to hypotension and increasing pressor requirements   Septic shock secondary to HAP Submandibular abscess with odontologic source Status post incision and drainage by ENT on 1/9 I spent some time talking with transfer centers at [facility] (they do not have oral surgeon,) and spoke with oral surgeon at [facility]. Finally did discuss with Dr. Sheryle locally. Plan for dental examination and extraction on Dec 04, 2023.  Leukocytosis trending down  P: Continue unasyn  and ciprofloxacin   Dental extraction at 12-04-2023  CVP q 4hrs Obtain ABG after aline placement  Continue to trend leuks   Acute metabolic encephalopathy due to sepsis P: Continue to use minimal sedation as possible   Hyponatremia D5W stopped 1/13 P: Continue free  water  15ml/q8hrs Monitor Na per bmet daily   Hyperglycemia P: Continue SSI Continue CBG q4  Severe protein calorie malnutrition POA Failure to thrive P: Continue tube feeds per RD recs  Thrombocytopenia Secondary to sepsis P:  Will plan to give 2 units of platelets at noon  Repeat level at 2pm   Best Practice (right click and Reselect all SmartList Selections daily)   Diet/type: tubefeeds DVT  prophylaxis holding due to thrombocytopenia Pressure ulcer(s): skin tear arm GI prophylaxis: PPI Lines: N/A Foley:  Yes, and it is still needed Code Status:  full code. Mother updated 12-15-2023 by phone discussed consent for Aline and will discuss further GOC once mother arrives to beside   Critical care time: 45 mins  Christian Claudene AGACNP-BC   [facility] 12/15/2023, 10:19 AM  Please see [email or web address] for pager details.  From 7A-7P if no response, please call [phone]. After hours, please call ELink [phone].

## 2023-12-15 NOTE — Death Summary Note (Signed)
 DEATH SUMMARY   Patient Details  Name: [name] MRN: [id number] DOB: [birth year]  Admission/Discharge Information   Admit Date:  12/17/2023  Date of Death: 12/25/2023  Time of Death: 16:18  Length of Stay: 7  Referring Physician: Vicci Barnie NOVAK, MD   Reason(s) for Hospitalization  Shortness of breath  Diagnoses  Preliminary cause of death: septic shock - pneumonia - pseudomonas, secondary to COPD, tobacco use disorder, alcohol  use, submandibular abscess.  Secondary Diagnoses (including complications and co-morbidities):  Principal Problem:   Bacteremia due to Enterococcus Active Problems:   TOBACCO ABUSE   COPD with acute exacerbation (HCC)   Chronic pancreatitis, unspecified pancreatitis type (HCC)   Alcohol  abuse   Type 2 diabetes mellitus with hyperglycemia (HCC)   Acute on chronic respiratory failure with hypoxia and hypercapnia (HCC)   CAP (community acquired pneumonia) - RLL   Septic shock (HCC)   Hyperkalemia   Elevated LFTs   Elevated INR   Protein-calorie malnutrition, severe   Submandibular abscess - right side   Dental infection   Need for hepatitis B screening test   Brief Hospital Course (including significant findings, care, treatment, and services provided and events leading to death)  [name] was a 63 y.o. year old male w/ hx of EtOH abuse, COPD on O2 at night, recurrent PNA probable aspiration presenting with lethargy and neck swelling.  Found to be in severe sepsis.  Imaging reveals a large necrotic gas-filled abscess extending from submandibular space to manubrium.  Worsening oxygenation and secretions overnights so PCCM consulted. Ended up in ICU after ENT took him for debridement incision and drainage. Mother noted FTT for past few months and food aversion as well as unintentional weight loss, diarrhea.  Does not think he's been drinking.  Developed septic shock and worsening respiratory failure. Respiratory cultures positive for  pseudomonas. Blood cultures positive on admission for enterococcus. Progressive clinical decline on morning of December 25, 2023. Explained worsening status to mother who desired to transition to comfort measures. He passed away peacefully with family at bedside.   Pertinent Labs and Studies  Significant Diagnostic Studies DG CHEST PORT 1 VIEW Result Date: Dec 25, 2023 CLINICAL DATA:  Acute respiratory failure EXAM: PORTABLE CHEST 1 VIEW COMPARISON:  11/26/2023 FINDINGS: There is hyperinflation of the lungs compatible with COPD. Heart and mediastinal contours within normal limits. Endotracheal tube and central line as well as NG tube are unchanged. Patchy bilateral airspace disease, in the left mid and lower lung and in the right mid lung, slightly improved since prior study. No effusions or pneumothorax. No acute bony abnormality. IMPRESSION: COPD. Patchy bilateral airspace disease, left greater than right, slightly improved since prior study. Electronically Signed   By: Franky Crease M.D.   On: 12-25-23 09:43   DG CHEST PORT 1 VIEW Result Date: 11/26/2023 CLINICAL DATA:  Acute respiratory failure. EXAM: PORTABLE CHEST 1 VIEW COMPARISON:  Chest x-rays from yesterday. FINDINGS: Unchanged endotracheal and enteric tubes. Unchanged left internal jugular central venous catheter. The heart size and mediastinal contours are within normal limits. Background emphysematous changes again noted. Diffuse interstitial and airspace opacities throughout both lungs, worsened at the right lung base, stable elsewhere. Slightly improved consolidation in the right upper lobe. Unchanged small left pleural effusion. No pneumothorax. No acute osseous abnormality. IMPRESSION: 1. Extensive multifocal pneumonia, slightly worsened at the right lung base, mildly improved in the right upper lobe. 2. Unchanged small left pleural effusion. Electronically Signed   By: Elsie ONEIDA Shoulder M.D.   On: 11/26/2023  12:21   DG CHEST PORT 1 VIEW Result Date:  11/25/2023 CLINICAL DATA:  Respiratory distress and hypotension. EXAM: PORTABLE CHEST 1 VIEW COMPARISON:  Radiographs earlier today. FINDINGS: Left central line, enteric tube, and endotracheal tubes in stable position. Progressive right midlung opacity otherwise stable multifocal right lung opacities. Worsening patchy airspace disease at the left lung base. Left pleural effusion is increasing. Background emphysema. The heart is normal in size. No pneumothorax. IMPRESSION: 1. Progressive right mid lung and left lung base opacities. Other areas of heterogeneous airspace opacity are stable. 2. Increasing left pleural effusion. Electronically Signed   By: Andrea Gasman M.D.   On: 11/25/2023 22:30   DG CHEST PORT 1 VIEW Result Date: 11/25/2023 CLINICAL DATA:  Sepsis EXAM: PORTABLE CHEST 1 VIEW COMPARISON:  Same-day x-ray FINDINGS: Interval placement of left IJ approach central venous catheter with distal tip terminating at the level of the mid SVC. Endotracheal and enteric tubes remain in satisfactory positioning. Normal heart size. Bilateral airspace opacities, most confluent in the right mid lung. Overall, slight progression of airspace disease compared to the prior study. Hyperlucent appearance of the lung apices compatible with underlying emphysema. Eight tiny right apical pneumothorax is not excluded. No evidence of a left-sided pneumothorax. IMPRESSION: 1. Interval placement of left IJ approach central venous catheter with distal tip terminating at the level of the mid SVC. No left-sided pneumothorax. 2. Tiny right apical pneumothorax is not excluded. Attention on follow-up. 3. Bilateral airspace opacities, most confluent in the right mid lung. Overall, slight progression of airspace disease compared to the prior study. Electronically Signed   By: Mabel Converse D.O.   On: 11/25/2023 17:05   US  EKG SITE RITE Result Date: 11/25/2023 If Site Rite image not attached, placement could not be confirmed due to  current cardiac rhythm.  DG Chest Port 1 View Result Date: 11/25/2023 CLINICAL DATA:  63 year old male with respiratory failure. Neck abscess. EXAM: PORTABLE CHEST 1 VIEW COMPARISON:  Chest CTA 11/23/2023 and earlier. FINDINGS: Portable AP semi upright view at 0516 hours. Large lung volumes, emphysema on CTA. Endotracheal tube tip in good position between the clavicles and carina. Enteric tube in good position, side hole the level of the gastric body. Progressive and confluent right upper lobe opacity since 11/23/2023. Streaky bilateral lower lung opacity also stable to mildly progressed. No pneumothorax or pulmonary edema. Mediastinal contours remain normal. IMPRESSION: 1. Satisfactory ET tube and enteric tube. 2. Progressive pneumonia in the right upper lobe, ongoing bilateral lower lobe pneumonia, superimposed on emphysema. Electronically Signed   By: VEAR Hurst M.D.   On: 11/25/2023 08:51   ECHOCARDIOGRAM COMPLETE Result Date: 11/24/2023    ECHOCARDIOGRAM REPORT   Patient Name:   REGINOLD BEALE Date of Exam: 11/24/2023 Medical Rec #:  [id number]          Height:       72.0 in Accession #:    [id number]         Weight:       113.3 lb Date of Birth:  1961-06-19          BSA:          1.674 m Patient Age:    62 years           BP:           84/67 mmHg Patient Gender: M                  HR:  66 bpm. Exam Location:  Inpatient Procedure: 2D Echo, Cardiac Doppler and Color Doppler Indications:    Bacteremia; abnormal ECG  History:        Patient has no prior history of Echocardiogram examinations.                 ETOH abuse and COPD, Arrythmias:Atrial Fibrillation; Risk                 Factors:Current Smoker and Diabetes.  Sonographer:    Tillman Nora RVT RCS Referring Phys: CORDELLA JONETTA JULY  Sonographer Comments: No apical window. Image acquisition challenging due to patient body habitus and Image acquisition challenging due to COPD. IMPRESSIONS  1. Septal flattening consistent with RV volume /pressure  overload. Left ventricular ejection fraction, by estimation, is 60 to 65%. The left ventricle has normal function. The left ventricle has no regional wall motion abnormalities.  2. Right ventricular systolic function is moderately reduced. The right ventricular size is moderately enlarged.  3. The mitral valve is normal in structure. Trivial mitral valve regurgitation.  4. The aortic valve is tricuspid. Aortic valve regurgitation is not visualized.  5. The inferior vena cava is dilated in size with <50% respiratory variability, suggesting right atrial pressure of 15 mmHg. FINDINGS  Left Ventricle: Septal flattening consistent with RV volume /pressure overload. Left ventricular ejection fraction, by estimation, is 60 to 65%. The left ventricle has normal function. The left ventricle has no regional wall motion abnormalities. The left ventricular internal cavity size was normal in size. There is no left ventricular hypertrophy. Right Ventricle: The right ventricular size is moderately enlarged. Right vetricular wall thickness was not assessed. Right ventricular systolic function is moderately reduced. Left Atrium: Left atrial size was normal in size. Right Atrium: Right atrial size was normal in size. Pericardium: Trivial pericardial effusion is present. Mitral Valve: The mitral valve is normal in structure. Trivial mitral valve regurgitation. Tricuspid Valve: The tricuspid valve is normal in structure. Tricuspid valve regurgitation is mild. Aortic Valve: The aortic valve is tricuspid. Aortic valve regurgitation is not visualized. Aortic valve mean gradient measures 2.0 mmHg. Aortic valve peak gradient measures 3.2 mmHg. Aortic valve area, by VTI measures 2.19 cm. Pulmonic Valve: The pulmonic valve was normal in structure. Pulmonic valve regurgitation is mild. Aorta: The aortic root is normal in size and structure. Venous: The inferior vena cava is dilated in size with less than 50% respiratory variability,  suggesting right atrial pressure of 15 mmHg. IAS/Shunts: No atrial level shunt detected by color flow Doppler.  LEFT VENTRICLE PLAX 2D LVIDd:         3.90 cm   Diastology LVIDs:         2.40 cm   LV e' medial:    5.37 cm/s LV PW:         0.90 cm   LV E/e' medial:  14.6 LV IVS:        0.80 cm   LV e' lateral:   8.58 cm/s LVOT diam:     1.80 cm   LV E/e' lateral: 9.2 LV SV:         38 LV SV Index:   23 LVOT Area:     2.54 cm  RIGHT VENTRICLE          IVC RV Basal diam:  3.70 cm  IVC diam: 2.40 cm RV Mid diam:    3.90 cm LEFT ATRIUM             Index  RIGHT ATRIUM           Index LA diam:        2.70 cm 1.61 cm/m   RA Area:     11.90 cm LA Vol (A2C):   20.7 ml 12.37 ml/m  RA Volume:   28.60 ml  17.09 ml/m LA Vol (A4C):   26.1 ml 15.60 ml/m LA Biplane Vol: 24.1 ml 14.40 ml/m  AORTIC VALVE                    PULMONIC VALVE AV Area (Vmax):    2.29 cm     PV Vmax:          0.69 m/s AV Area (Vmean):   2.15 cm     PV Peak grad:     1.9 mmHg AV Area (VTI):     2.19 cm     PR End Diast Vel: 3.36 msec AV Vmax:           89.20 cm/s AV Vmean:          57.100 cm/s AV VTI:            0.174 m AV Peak Grad:      3.2 mmHg AV Mean Grad:      2.0 mmHg LVOT Vmax:         80.20 cm/s LVOT Vmean:        48.200 cm/s LVOT VTI:          0.150 m LVOT/AV VTI ratio: 0.86  AORTA Ao Root diam: 3.70 cm MITRAL VALVE               TRICUSPID VALVE MV Area (PHT): 3.77 cm    TR Peak grad:   36.0 mmHg MV Decel Time: 201 msec    TR Vmax:        300.00 cm/s MV E velocity: 78.60 cm/s MV A velocity: 57.60 cm/s  SHUNTS MV E/A ratio:  1.36        Systemic VTI:  0.15 m                            Systemic Diam: 1.80 cm Vina Gull MD Electronically signed by Vina Gull MD Signature Date/Time: 11/24/2023/2:39:35 PM    Final    DG Abd 1 View Result Date: 11/23/2023 CLINICAL DATA:  OG tube placement EXAM: ABDOMEN - 1 VIEW COMPARISON:  11/22/2023 FINDINGS: Enteric tube tip and side port overlie gastric fundus. Excreted contrast within the bilateral  kidneys. IMPRESSION: Enteric tube tip and side port overlie gastric fundus. Electronically Signed   By: Luke Bun M.D.   On: 11/23/2023 17:06   CT Angio Chest Pulmonary Embolism (PE) W or WO Contrast Result Date: 11/23/2023 CLINICAL DATA:  Concern for pulmonary embolism. Respiratory distress. EXAM: CT ANGIOGRAPHY CHEST WITH CONTRAST TECHNIQUE: Multidetector CT imaging of the chest was performed using the standard protocol during bolus administration of intravenous contrast. Multiplanar CT image reconstructions and MIPs were obtained to evaluate the vascular anatomy. RADIATION DOSE REDUCTION: This exam was performed according to the departmental dose-optimization program which includes automated exposure control, adjustment of the mA and/or kV according to patient size and/or use of iterative reconstruction technique. CONTRAST:  75mL OMNIPAQUE  IOHEXOL  350 MG/ML SOLN COMPARISON:  Chest radiograph date 11/23/2023 and CT dated 11/19/2023. FINDINGS: Evaluation of this exam is limited due to respiratory motion. Cardiovascular: There is no cardiomegaly or pericardial effusion. Three-vessel coronary vascular calcification. There  is mild atherosclerotic calcification of the thoracic aorta. No aneurysmal dilatation or dissection. No pulmonary artery embolus identified. Mediastinum/Nodes: Mildly enlarged right hilar lymph node measures 15 mm short axis. An enteric tube is noted in the esophagus. No mediastinal fluid collection. Lungs/Pleura: Background of emphysema. Progression of bilateral lower lobe infiltrate and consolidative changes since the prior CT. No pneumothorax. There is mucous impaction with near complete occlusion of the left lower lobe bronchus as well as impaction of the distal right lower lobe bronchi. The central airways remain patent. Endotracheal tube approximately 5 cm above the carina. Upper Abdomen: Enteric tube with tip in the body of the stomach. Musculoskeletal: Osteopenia with degenerative  changes. There is diffuse subcutaneous edema and anasarca and loss of subcutaneous fat and cachexia. No acute osseous pathology. Review of the MIP images confirms the above findings. IMPRESSION: 1. No CT evidence of pulmonary artery embolus. 2. Progression of bilateral lower lobe infiltrate and consolidative changes since the prior CT. 3. Mucous impaction of the left lower lobe bronchus and distal right lower lobe bronchi. 4. Aortic Atherosclerosis (ICD10-I70.0) and Emphysema (ICD10-J43.9). Electronically Signed   By: Vanetta Chou M.D.   On: 11/23/2023 15:35   DG CHEST PORT 1 VIEW Result Date: 11/23/2023 CLINICAL DATA:  Respiratory distress. EXAM: PORTABLE CHEST 1 VIEW COMPARISON:  Chest radiograph dated 11/22/2023. FINDINGS: Endotracheal tube approximately 5.5 cm above the carina. Enteric tube with side-port just distal to the GE junction and tip in the proximal stomach. Recommend further advancing of the tube by additional 5 cm for optimal positioning. Small bilateral pleural effusions and bilateral mid to lower lung field reticulonodular opacities consistent with pneumonia. No pneumothorax. Background of emphysema. The cardiac silhouette is within normal limits. No acute osseous pathology. IMPRESSION: 1. Endotracheal tube in satisfactory position. 2. Enteric tube with tip in the proximal stomach. Recommend further advancing of the tube by additional 5 cm for optimal positioning. 3. Small bilateral pleural effusions and bilateral mid to lower lung field pneumonia. Electronically Signed   By: Vanetta Chou M.D.   On: 11/23/2023 12:59   DG Abd 1 View Result Date: 11/22/2023 CLINICAL DATA:  Check gastric catheter placement EXAM: ABDOMEN - 1 VIEW COMPARISON:  None Available. FINDINGS: Gastric catheter is noted within the distal esophagus. This should be advanced several cm deeper into the stomach. Nonobstructive bowel gas pattern is noted. IMPRESSION: Gastric catheter within the distal esophagus. This  should be advanced several cm deeper into the stomach. Electronically Signed   By: Oneil Devonshire M.D.   On: 11/22/2023 17:12   DG Chest Port 1 View Result Date: 11/22/2023 CLINICAL DATA:  Endotracheal tube placement. EXAM: PORTABLE CHEST 1 VIEW COMPARISON:  One-view chest x-ray 11/22/2023 at 1:18 a.m. FINDINGS: An Endotracheal tube was placed, terminating 6.3 cm above the carina. Partial collapse of the right lower lobe is now present. Progressive right pleural effusion is present. Minimal airspace disease at the left base is stable. IMPRESSION: 1. Endotracheal tube terminates 6.3 cm above the carina. 2. Partial collapse of the right lower lobe. 3. Progressive right pleural effusion. Electronically Signed   By: Lonni Necessary M.D.   On: 11/22/2023 15:46   DG CHEST PORT 1 VIEW Result Date: 11/22/2023 CLINICAL DATA:  Aspiration into airway, shortness of breath and low oxygen  saturations EXAM: PORTABLE CHEST 1 VIEW COMPARISON:  Radiograph 11/19/2023 FINDINGS: Stable cardiomediastinal silhouette. Airspace and interstitial opacities in the right-greater-than-left lower lungs. The left costophrenic angle is excluded from the image. No pneumothorax. No displaced rib  fractures. IMPRESSION: Right-greater-than-left lower lung airspace and interstitial opacities consistent with aspiration and/or pneumonia. Electronically Signed   By: Norman Gatlin M.D.   On: 11/22/2023 01:28   CT SOFT TISSUE NECK W CONTRAST Result Date: 11/21/2023 CLINICAL DATA:  Soft tissue infection suspected, neck, xray done radiologist recommended soft tissue neck with IV contrast EXAM: CT NECK WITH CONTRAST TECHNIQUE: Multidetector CT imaging of the neck was performed using the standard protocol following the bolus administration of intravenous contrast. RADIATION DOSE REDUCTION: This exam was performed according to the departmental dose-optimization program which includes automated exposure control, adjustment of the mA and/or kV according  to patient size and/or use of iterative reconstruction technique. CONTRAST:  75mL OMNIPAQUE  IOHEXOL  350 MG/ML SOLN COMPARISON:  None Available. FINDINGS: Pharynx and larynx: There is a large multiloculated multi spatial complex air and fluid containing rim enhancing fluid collection with the dominant collection centered around the right submandibular space measuring 5.2 x 3.9 x 4.6 cm (series 4, image 64). Additional rim enhancing components are also present at the floor of the mouth (series 4, image 55), the subcutaneous soft tissues overlying the thyroid  cartilage with a extension inferiorly to the level of the left sternoclavicular joint (series 4, image 88-101). Bilateral tonsils are normal in appearance. The epiglottis is normal in appearance. There are layering secretions in the hypopharynx. Central airways patent. Salivary glands: Bilateral salivary glands are poorly visualized due to poor contrast resolution. Thyroid : Normal. Lymph nodes: None enlarged or abnormal density. Vascular: Negative. Limited intracranial: Negative. Visualized orbits: Negative. Mastoids and visualized paranasal sinuses: No middle ear or mastoid effusion. Paranasal sinuses are notable for mild mucosal thickening of the floor of bilateral maxillary sinuses. Orbits are unremarkable. Skeleton: No acute fracture. There is odontogenic disease with multiple large dental caries. Upper chest: Moderate to severe centrilobular emphysema. See separately dictated CT chest for additional findings Other: None. IMPRESSION: 1. Large multiloculated multi spatial likely odontogenic abscess centered around the right submandibular space measuring 5.2 x 3.9 x 4.6 cm. Additional rim enhancing components are also present at the floor of the mouth and the subcutaneous soft tissues overlying the thyroid  cartilage with extension inferiorly to the level of the left sternoclavicular joint. 2. 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 Emphysema  (ICD10-J43.9). Electronically Signed   By: Lyndall Gore M.D.   On: 11/21/2023 13:34   CT CHEST WO CONTRAST Addendum Date: 11/21/2023 ADDENDUM REPORT: 11/21/2023 11:36 ADDENDUM: Study discussed by telephone with Dr. CAMELLIA CHEN on 11/21/2023 at 1036 hours. Stat Neck CT with IV contrast is pending. Electronically Signed   By: VEAR Hurst M.D.   On: 11/21/2023 11:36   Result Date: 11/21/2023 CLINICAL DATA:  63 year old male with shortness of breath and abnormal right lung base opacity. History of smoking, COPD. EXAM: CT CHEST WITHOUT CONTRAST TECHNIQUE: Multidetector CT imaging of the chest was performed following the standard protocol without IV contrast. RADIATION DOSE REDUCTION: This exam was performed according to the departmental dose-optimization program which includes automated exposure control, adjustment of the mA and/or kV according to patient size and/or use of iterative reconstruction technique. COMPARISON:  Chest radiographs 11/19/2023.  Chest CT 07/03/2023. FINDINGS: Cardiovascular: Normal heart size. No evidence of pericardial effusion. Calcified aortic atherosclerosis. Vascular patency is not evaluated in the absence of IV contrast. Mediastinum/Nodes: No mediastinal mass or lymphadenopathy evident on this noncontrast exam. Lungs/Pleura: Moderate to severe bilateral emphysema. Similar lung volumes to the CT last year. Trachea and central airways are patent. But at the bronchus intermedius there  is layering fluid or secretion in the right lower lobe bronchus and the distal airways are largely opacified (series 4, image 97). Widespread patchy, peribronchial opacity in the lateral segment of the right middle lobe. Consolidation in the posterior basal segment of the right lower lobe with air bronchograms. Less pronounced patchy and dependent left lower lobe opacity also. Trace if any pleural fluid. No other consolidation. Upper lobes, lingula appear stable since August. Upper Abdomen: Limited detail of the upper  abdominal viscera due to noncontrast technique and very little visceral fat. No upper abdominal free air or free fluid. Calcified aortic atherosclerosis. Faint oral contrast in bowel in the left upper quadrant. Musculoskeletal: No acute or suspicious osseous lesion is identified in the chest. Other findings: Partially visible abnormal soft tissue gas in the submental, anterior neck on series 3, image 1. This tracks anterior to the right thyroid  cartilage and larynx. Grossly normal visible laryngeal contours. Nearby thyroid  appears diminutive and negative. IMPRESSION: 1. Abnormal soft tissue gas partially visible in the anterior neck. If no penetrating trauma then this is suspicious for a gas forming, necrotizing soft tissue infection. Neck CT with IV contrast might evaluate further. 2. Right middle and right greater than left lower lobe bronchopneumonia/pneumonia with airway secretions and opacification. Consider Aspiration. Underlying advanced Emphysema (ICD10-J43.9). Trace if any pleural fluid. 3. Cachexia. 4. Aortic Atherosclerosis (ICD10-I70.0). Electronically Signed: By: VEAR Hurst M.D. On: 11/21/2023 09:58   DG Chest 2 View Result Date: 11/19/2023 CLINICAL DATA:  Shortness of breath and hypoxia, initial encounter EXAM: CHEST - 2 VIEW COMPARISON:  05/28/2023 FINDINGS: Cardiac shadow is within normal limits. Lungs are well aerated bilaterally. Patchy airspace opacity is noted in the right base laterally new from the prior exam consistent with acute infiltrate. No acute bony abnormality is noted. IMPRESSION: Right basilar infiltrate. Electronically Signed   By: Oneil Devonshire M.D.   On: 11/19/2023 21:29    Microbiology Recent Results (from the past 240 hours)  Blood culture (routine x 2)     Status: None   Collection Time: 11/19/23 10:50 PM   Specimen: BLOOD RIGHT FOREARM  Result Value Ref Range Status   Specimen Description BLOOD RIGHT FOREARM  Final   Special Requests   Final    BOTTLES DRAWN AEROBIC  AND ANAEROBIC Blood Culture results may not be optimal due to an inadequate volume of blood received in culture bottles   Culture   Final    NO GROWTH 5 DAYS Performed at [facility], [address] [place], [place] [address]    Report Status 11/24/2023 FINAL  Final  Blood culture (routine x 2)     Status: None   Collection Time: 11/19/23 11:00 PM   Specimen: BLOOD  Result Value Ref Range Status   Specimen Description BLOOD RIGHT ANTECUBITAL  Final   Special Requests   Final    BOTTLES DRAWN AEROBIC AND ANAEROBIC Blood Culture results may not be optimal due to an inadequate volume of blood received in culture bottles   Culture   Final    NO GROWTH 5 DAYS Performed at [facility], [address], [place], [place] [address]    Report Status 11/24/2023 FINAL  Final  Resp panel by RT-PCR (RSV, Flu A&B, Covid) Anterior Nasal Swab     Status: None   Collection Time: 11/19/23 11:10 PM   Specimen: Anterior Nasal Swab  Result Value Ref Range Status   SARS Coronavirus 2 by RT PCR NEGATIVE NEGATIVE Final  Influenza A by PCR NEGATIVE NEGATIVE Final   Influenza B by PCR NEGATIVE NEGATIVE Final    Comment: (NOTE) The Xpert Xpress SARS-CoV-2/FLU/RSV plus assay is intended as an aid in the diagnosis of influenza from Nasopharyngeal swab specimens and should not be used as a sole basis for treatment. Nasal washings and aspirates are unacceptable for Xpert Xpress SARS-CoV-2/FLU/RSV testing.  Fact Sheet for Patients: bloggercourse.com  Fact Sheet for Healthcare Providers: seriousbroker.it  This test is not yet approved or cleared by the [place]  FDA and has been authorized for detection and/or diagnosis of SARS-CoV-2 by FDA under an Emergency Use Authorization (EUA). This EUA will remain in effect (meaning this test can be used) for the duration of the COVID-19 declaration under Section 564(b)(1) of the Act, 21  U.S.C. section 360bbb-3(b)(1), unless the authorization is terminated or revoked.     Resp Syncytial Virus by PCR NEGATIVE NEGATIVE Final    Comment: (NOTE) Fact Sheet for Patients: bloggercourse.com  Fact Sheet for Healthcare Providers: seriousbroker.it  This test is not yet approved or cleared by the [place]  FDA and has been authorized for detection and/or diagnosis of SARS-CoV-2 by FDA under an Emergency Use Authorization (EUA). This EUA will remain in effect (meaning this test can be used) for the duration of the COVID-19 declaration under Section 564(b)(1) of the Act, 21 U.S.C. section 360bbb-3(b)(1), unless the authorization is terminated or revoked.  Performed at [facility], [address] [place], [place] [address]   Culture, blood (routine x 2) Call MD if unable to obtain prior to antibiotics being given     Status: Abnormal   Collection Time: 11/20/23  1:40 AM   Specimen: BLOOD  Result Value Ref Range Status   Specimen Description BLOOD RIGHT ANTECUBITAL  Final   Special Requests   Final    BOTTLES DRAWN AEROBIC AND ANAEROBIC Blood Culture results may not be optimal due to an inadequate volume of blood received in culture bottles   Culture  Setup Time   Final    GRAM POSITIVE COCCI IN CHAINS AEROBIC BOTTLE ONLY CRITICAL RESULT CALLED TO, READ BACK BY AND VERIFIED WITH: MAYA HARBOUR, A [id number] FCP Performed at [facility], [address], [place], [place] [address]    Culture ENTEROCOCCUS FAECALIS (A)  Final   Report Status 11/26/2023 FINAL  Final   Organism ID, Bacteria ENTEROCOCCUS FAECALIS  Final      Susceptibility   Enterococcus faecalis - MIC*    AMPICILLIN  <=2 SENSITIVE Sensitive     VANCOMYCIN  1 SENSITIVE Sensitive     GENTAMICIN SYNERGY SENSITIVE Sensitive     * ENTEROCOCCUS FAECALIS  Blood Culture ID Panel (Reflexed)     Status: Abnormal   Collection Time: 11/20/23  1:40 AM   Result Value Ref Range Status   Enterococcus faecalis DETECTED (A) NOT DETECTED Final   Enterococcus Faecium NOT DETECTED NOT DETECTED Final    Comment: CRITICAL RESULT CALLED TO, READ BACK BY AND VERIFIED WITH: PHARMD MYER, A [id number] FCP    Listeria monocytogenes NOT DETECTED NOT DETECTED Final   Staphylococcus species NOT DETECTED NOT DETECTED Final   Staphylococcus aureus (BCID) NOT DETECTED NOT DETECTED Final   Staphylococcus epidermidis NOT DETECTED NOT DETECTED Final   Staphylococcus lugdunensis NOT DETECTED NOT DETECTED Final   Streptococcus species NOT DETECTED NOT DETECTED Final   Streptococcus agalactiae NOT DETECTED NOT DETECTED Final   Streptococcus pneumoniae NOT DETECTED NOT DETECTED Final   Streptococcus pyogenes  NOT DETECTED NOT DETECTED Final   A.calcoaceticus-baumannii NOT DETECTED NOT DETECTED Final   Bacteroides fragilis NOT DETECTED NOT DETECTED Final   Enterobacterales NOT DETECTED NOT DETECTED Final   Enterobacter cloacae complex NOT DETECTED NOT DETECTED Final   Escherichia coli NOT DETECTED NOT DETECTED Final   Klebsiella aerogenes NOT DETECTED NOT DETECTED Final   Klebsiella oxytoca NOT DETECTED NOT DETECTED Final   Klebsiella pneumoniae NOT DETECTED NOT DETECTED Final   Proteus species NOT DETECTED NOT DETECTED Final   Salmonella species NOT DETECTED NOT DETECTED Final   Serratia marcescens NOT DETECTED NOT DETECTED Final   Haemophilus influenzae NOT DETECTED NOT DETECTED Final   Neisseria meningitidis NOT DETECTED NOT DETECTED Final   Pseudomonas aeruginosa NOT DETECTED NOT DETECTED Final   Stenotrophomonas maltophilia NOT DETECTED NOT DETECTED Final   Candida albicans NOT DETECTED NOT DETECTED Final   Candida auris NOT DETECTED NOT DETECTED Final   Candida glabrata NOT DETECTED NOT DETECTED Final   Candida krusei NOT DETECTED NOT DETECTED Final   Candida parapsilosis NOT DETECTED NOT DETECTED Final   Candida tropicalis NOT DETECTED NOT DETECTED  Final   Cryptococcus neoformans/gattii NOT DETECTED NOT DETECTED Final   Vancomycin  resistance NOT DETECTED NOT DETECTED Final    Comment: Performed at [facility], [address] [place], [place] [address]  Culture, blood (routine x 2) Call MD if unable to obtain prior to antibiotics being given     Status: None   Collection Time: 11/20/23  1:55 AM   Specimen: BLOOD  Result Value Ref Range Status   Specimen Description BLOOD LEFT ANTECUBITAL  Final   Special Requests   Final    BOTTLES DRAWN AEROBIC AND ANAEROBIC Blood Culture results may not be optimal due to an inadequate volume of blood received in culture bottles   Culture   Final    NO GROWTH 5 DAYS Performed at [facility], [address], [place], [place] [address]    Report Status 11/25/2023 FINAL  Final  MRSA Next Gen by PCR, Nasal     Status: None   Collection Time: 11/20/23  7:00 AM   Specimen: Nasal Mucosa; Nasal Swab  Result Value Ref Range Status   MRSA by PCR Next Gen NOT DETECTED NOT DETECTED Final    Comment: (NOTE) The GeneXpert MRSA Assay (FDA approved for NASAL specimens only), is one component of a comprehensive MRSA colonization surveillance program. It is not intended to diagnose MRSA infection nor to guide or monitor treatment for MRSA infections. Test performance is not FDA approved in patients less than 28 years old. Performed at [facility], [address], [place], [place] [address]   Aerobic/Anaerobic Culture w Gram Stain (surgical/deep wound)     Status: Abnormal   Collection Time: 11/22/23 12:04 PM   Specimen: Abscess  Result Value Ref Range Status   Specimen Description ABSCESS  Final   Special Requests NECK ABSCESS  Final   Gram Stain   Final    ABUNDANT WBC PRESENT, PREDOMINANTLY PMN FEW GRAM POSITIVE COCCI IN PAIRS    Culture (A)  Final    MULTIPLE ORGANISMS PRESENT, NONE PREDOMINANT NO ANAEROBES ISOLATED Performed at [facility], [address]  [place], [place] [address]    Report Status 21-Dec-2023 FINAL  Final  Expectorated Sputum Assessment w Gram Stain, Rflx to Resp Cult     Status: None   Collection Time: 11/23/23  2:25 AM   Specimen: Expectorated Sputum  Result Value Ref Range Status   Specimen Description EXPECTORATED SPUTUM  Final   Special Requests Normal  Final   Sputum evaluation   Final    THIS SPECIMEN IS ACCEPTABLE FOR SPUTUM CULTURE Performed at [facility], [address], [place], [place] [address]    Report Status 11/23/2023 FINAL  Final  Culture, Respiratory w Gram Stain     Status: None   Collection Time: 11/23/23  2:25 AM  Result Value Ref Range Status   Specimen Description EXPECTORATED SPUTUM  Final   Special Requests Normal Reflexed from [id number]  Final   Gram Stain   Final    MODERATE WBC PRESENT, PREDOMINANTLY PMN MODERATE GRAM NEGATIVE RODS RARE YEAST WITH PSEUDOHYPHAE Performed at [facility], [address], [place], [place] [address]    Culture ABUNDANT PSEUDOMONAS AERUGINOSA  Final   Report Status 11/25/2023 FINAL  Final   Organism ID, Bacteria PSEUDOMONAS AERUGINOSA  Final      Susceptibility   Pseudomonas aeruginosa - MIC*    CEFTAZIDIME 8 SENSITIVE Sensitive     CIPROFLOXACIN  <=0.25 SENSITIVE Sensitive     GENTAMICIN <=1 SENSITIVE Sensitive     IMIPENEM 1 SENSITIVE Sensitive     PIP/TAZO 16 SENSITIVE Sensitive ug/mL    CEFEPIME  2 SENSITIVE Sensitive     * ABUNDANT PSEUDOMONAS AERUGINOSA  Culture, blood (Routine X 2) w Reflex to ID Panel     Status: None (Preliminary result)   Collection Time: 11/24/23  5:09 AM   Specimen: BLOOD RIGHT HAND  Result Value Ref Range Status   Specimen Description BLOOD RIGHT HAND  Final   Special Requests   Final    BOTTLES DRAWN AEROBIC AND ANAEROBIC Blood Culture results may not be optimal due to an inadequate volume of blood received in culture bottles   Culture   Final    NO GROWTH 3 DAYS Performed at [facility], [address], [place], [place] [address]    Report Status PENDING  Incomplete  Culture, blood (Routine X 2) w Reflex to ID Panel     Status: None (Preliminary result)   Collection Time: 11/24/23  5:09 AM   Specimen: BLOOD RIGHT ARM  Result Value Ref Range Status   Specimen Description BLOOD RIGHT ARM  Final   Special Requests   Final    BOTTLES DRAWN AEROBIC AND ANAEROBIC Blood Culture results may not be optimal due to an inadequate volume of blood received in culture bottles   Culture   Final    NO GROWTH 3 DAYS Performed at [facility], [address] [place], [place] [address]    Report Status PENDING  Incomplete    Lab Basic Metabolic Panel: Recent Labs  Lab 11/24/23 0509 11/24/23 0946 11/24/23 1704 11/25/23 0740 11/25/23 1653 11/25/23 2012 11/25/23 2343 11/26/23 0424 12-27-23 0316 2023-12-27 1127  NA 155*  --   --  151* 143 143 138 140 143 143  K 3.4*  --   --  3.4* 3.0* 2.9* 3.4* 3.6 3.8 4.2  CL 106  --   --  101 98  --   --  93* 96*  --   CO2 39*  --   --  38* 35*  --   --  36* 36*  --   GLUCOSE 130*  --   --  274* 283*  --   --  160* 137*  --   BUN 40*  --   --  37* 35*  --   --  27* 36*  --   CREATININE 0.53* 0.55*  --  0.65 0.47*  --   --  0.51* 0.48*  --   CALCIUM  7.8*  --   --  7.7* 7.1*  --   --  6.9* 7.5*  --   MG 2.1  --  1.6* 1.5* 1.4*  --   --  2.2  --   --   PHOS 3.5  --  2.8 3.0 2.8  --   --  2.9  --   --    Liver Function Tests: Recent Labs  Lab 11/23/23 0839 11/24/23 0509 11/25/23 0740 11/26/23 0424 11/29/23 0316  AST 40 29 567* 425* 56*  ALT 83* 63* 217* 687* 323*  ALKPHOS 112 107 216* 320* 210*  BILITOT 0.9 1.3* 1.3* 3.2* 3.0*  PROT 5.0* 5.0* 4.5* 4.3* 4.7*  ALBUMIN  2.5* 2.2* 1.7* 1.6* 1.5*   No results for input(s): LIPASE, AMYLASE in the last 168 hours. Recent Labs  Lab 11/24/23 0855  AMMONIA 34   CBC: Recent Labs  Lab 11/21/23 0231 11/22/23 0220 11/22/23 1556 11/24/23 0509 11/24/23 0946 11/25/23 0517 11/25/23 2012  11/25/23 2343 11/26/23 0424 2023-11-29 0316 11/29/23 1127  WBC 19.4* 18.2*   < > 34.3* 33.8* 38.9*  --   --  25.9* 19.2*  --   NEUTROABS 17.8* 16.8*  --   --   --   --   --   --   --   --   --   HGB 13.9 12.3*   < > 13.3 12.9* 13.9 15.3 15.3 13.4 12.2* 13.6  HCT 41.5 38.4*   < > 42.7 41.7 44.0 45.0 45.0 42.0 38.2* 40.0  MCV 89.2 91.4   < > 94.5 94.1 94.0  --   --  93.1 91.8  --   PLT 87* 75*   < > 70* 59* 46*  --   --  29* 30*  --    < > = values in this interval not displayed.   Cardiac Enzymes: No results for input(s): CKTOTAL, CKMB, CKMBINDEX, TROPONINI in the last 168 hours. Sepsis Labs: Recent Labs  Lab 11/24/23 0855 11/24/23 0946 11/25/23 0517 11/26/23 0424 29-Nov-2023 0316  WBC  --  33.8* 38.9* 25.9* 19.2*  LATICACIDVEN 1.9  --   --   --   --       Verdon GORMAN Gore 11/29/2023, 5:02 PM

## 2023-12-15 NOTE — Progress Notes (Signed)
 Patient ID: [name], male   DOB: [birth year], 63 y.o.   MRN: [id number]  He is getting teeth extracted today and I will participate in just examining the neck wound and debriding some the exudate out of the wound. The neck wound appearance is stable and it does not look like necrotizing fasciitis.

## 2023-12-15 NOTE — Progress Notes (Signed)
 PT Cancellation Note  Patient Details Name: [name] MRN: [id number] DOB: 24-Aug-1961   Cancelled Treatment:    Reason Eval/Treat Not Completed: Other (comment)  Patient at procedure or test/ unavailable. Pt planned for OR today. Currently multiple team members in his room for testing. Will check back on pt tomorrow and see if appropriate for comprehensive PT re-eval/treatment.    Leontine Roads, PT, DPT [facility]  Rehabilitation Services Physical Therapist Office: [phone] Website: [email or web address]  Leontine GORMAN Roads 12/16/23, 9:42 AM

## 2023-12-15 NOTE — Progress Notes (Signed)
 Nutrition Brief Note  Chart reviewed. Pt now transitioning to comfort care.  No further nutrition interventions planned at this time.  Please re-consult as needed.   Drusilla Kanner, RDN, LDN Clinical Nutrition

## 2023-12-15 NOTE — Progress Notes (Signed)
 OT Cancellation Note  Patient Details Name: [name] MRN: [id number] DOB: 07/16/1961   Cancelled Treatment:    Reason Eval/Treat Not Completed: Patient at procedure or test/ unavailable. Pt for multiple teeth extractions later today. Currently multiple team members in his room completely a procedure. Will check back on pt tomorrow and see if appropriate for comprehensive OT re-eval/treatment.   [name] OT Acute Rehabilitation Services Office [phone]    [name] 12/17/2023, 9:40 AM

## 2023-12-15 NOTE — TOC Progression Note (Signed)
 Transition of Care [facility]) - Progression Note    Patient Details  Name: [name] MRN: [id number] Date of Birth: March 11, 1961  Transition of Care [facility]) CM/SW Contact  Tom-Johnson, Sehar Sedano Daphne, RN Phone Number: 12-22-2023, 2:55 PM  Clinical Narrative:     Patient continues on Vent. Patient with worsening Hypotension and Vasopressor requirement.  Patient underwent Dental Extraction and Submandibular Abscess Debridement today 2023/12/22 by Otolaryngology/ENT.   Patient not Medically ready for discharge.  CM will continue to follow.            Expected Discharge Plan and Services                                               Social Determinants of Health (SDOH) Interventions SDOH Screenings   Food Insecurity: No Food Insecurity (11/20/2023)  Housing: Low Risk  (11/20/2023)  Transportation Needs: No Transportation Needs (11/20/2023)  Utilities: Not At Risk (11/20/2023)  Alcohol  Screen: Low Risk  (09/18/2023)  Depression (PHQ2-9): Low Risk  (02/07/2022)  Financial Resource Strain: Low Risk  (09/18/2023)  Physical Activity: Inactive (09/18/2023)  Social Connections: Moderately Integrated (09/18/2023)  Stress: No Stress Concern Present (09/18/2023)  Tobacco Use: High Risk (11/22/2023)  Health Literacy: Adequate Health Literacy (09/18/2023)    Readmission Risk Interventions     No data to display

## 2023-12-15 NOTE — Inpatient Diabetes Management (Signed)
 Inpatient Diabetes Program Recommendations  AACE/ADA: New Consensus Statement on Inpatient Glycemic Control (2015)  Target Ranges:  Prepandial:   less than 140 mg/dL      Peak postprandial:   less than 180 mg/dL (1-2 hours)      Critically ill patients:  140 - 180 mg/dL   Lab Results  Component Value Date   GLUCAP 114 (H) 2023-12-03   HGBA1C 6.8 (H) 11/20/2023    Review of Glycemic Control  Latest Reference Range & Units 11/26/23 15:22 11/26/23 19:50 11/26/23 23:14 11/26/23 23:17 Dec 03, 2023 03:19 03-Dec-2023 07:28 12-03-23 07:31  Glucose-Capillary 70 - 99 mg/dL 75 899 (H) 42 (LL) 77 877 (H) 66 (L) 114 (H)   Diabetes history: DM 2 Outpatient Diabetes medications: Amaryl  1 mg Daily, Metformin  2000 mg Daily  Current orders for Inpatient glycemic control:  Novolog  0-20 units Q4 Novolog  3 units Q4 hours Tube Feed Coverage  Solucortef 50 mg Q6 hours Osmolite 55 ml/hour  Note: hypoglycemia  Inpatient Diabetes Program Recommendations:    -   Reduce Novolog  Correction scale 0-9 units Q4 -   d/c Novolog  Tube Feed coverage for now as pt is not requiring coverage.  Thanks,  Clotilda Bull RN, MSN, BC-ADM Inpatient Diabetes Coordinator Team Pager [phone] (8a-5p)

## 2023-12-15 NOTE — IPAL (Signed)
  Interdisciplinary Goals of Care Family Meeting   Date carried out: 2023-12-08  Location of the meeting: Bedside  Member's involved: Family Member or next of kin   Durable Power of Attorney or environmental health practitioner: Mother  Discussion: We discussed goals of care for [name] . Discussed patient ongoing critical status, and despite current medical interventions patient continues to be in refractory septic shock requiring increasing vasopressor requirements. Mother of patient expressed that her son would not want to continue to live in this state. Mother asked to transition to comfort care. Orders of comfort care was placed. Notified Beside RN, Rock Buddle RN in regards to transition to comfort care.   Code status:   Code Status: Do not attempt resuscitation (DNR) - Comfort care   Disposition: In-patient comfort care  Time spent for the meeting: 25 mins    Christian Deyonna Fitzsimmons AGACNP-BC   [facility] Dec 08, 2023, 5:36 PM  Please see [email or web address] for pager details.  From 7A-7P if no response, please call [phone]. After hours, please call ELink [phone].

## 2023-12-15 NOTE — Progress Notes (Signed)
 Patient extubated at 1555. On Fentanyl  drip. Agonal respirations. Family at bedside. Asystole at 1618. No heart tones or respiratory effort audible or visible. Pupils 4 and fixed bilaterally. Patient pronounced dead by 2 RNs - Shawnee Seats RN and Rosalva Sink, RN. Dr. Meade was notified.

## 2023-12-15 NOTE — Accreditation Note (Signed)
Restraint death within 24 hours of  bilateral soft restraints logged on 12/03/2023 at 1333 by Laurene Footman RN

## 2023-12-15 NOTE — Consult Note (Signed)
 WOC Nurse Consult Note: Reason for Consult:  DTI coccyx  Wound type: Deep Tissue Pressure Injury Coccyx evolving with partial thickness skin loss at the time of this visit  Pressure Injury POA: no  Measurement: 7 cm x 2 cm x 0.1 cm  Wound bed: 100% red with dark maroon tissue scattered throughout, purple maroon discoloration at edges, evolving with partial thickness skin loss as above  Drainage (amount, consistency, odor) minimal serosanguinous  Periwound: erythema, scattered areas of partial thickness skin loss  Dressing procedure/placement/frequency: Cleanse coccyx wound with Vashe wound cleanser Soila [phone]), apply a layer of Xeroform gauze to area daily.  Secure Xeroform with silicone foam or ABD pad whichever preferred. Coat surrounding skin of sacrum and buttocks with a thin layer of Desitin 2 times a day and prn soiling.   WOC team will follow every 7 to 10 days to evaluate wound and change wound care as appropriate.   Thank you,    Powell Bar MSN, RN-BC, [employer] [phone]

## 2023-12-15 DEATH — deceased

## 2023-12-24 LAB — FUNGUS CULTURE WITH STAIN

## 2023-12-24 LAB — FUNGUS CULTURE RESULT

## 2023-12-24 LAB — FUNGAL ORGANISM REFLEX

## 2024-01-08 ENCOUNTER — Other Ambulatory Visit (HOSPITAL_BASED_OUTPATIENT_CLINIC_OR_DEPARTMENT_OTHER): Payer: Medicaid Other

## 2024-01-22 ENCOUNTER — Ambulatory Visit: Payer: Medicaid Other | Admitting: Internal Medicine
